# Patient Record
Sex: Female | Born: 1970 | Race: Black or African American | Hispanic: No | Marital: Married | Smoking: Current some day smoker
Health system: Southern US, Community
[De-identification: ages and names within clinical notes are randomized; demographics above are authoritative.]

## PROBLEM LIST (undated history)

## (undated) DIAGNOSIS — G4730 Sleep apnea, unspecified: Secondary | ICD-10-CM

## (undated) DIAGNOSIS — Z803 Family history of malignant neoplasm of breast: Secondary | ICD-10-CM

## (undated) DIAGNOSIS — I10 Essential (primary) hypertension: Secondary | ICD-10-CM

## (undated) DIAGNOSIS — E119 Type 2 diabetes mellitus without complications: Secondary | ICD-10-CM

## (undated) DIAGNOSIS — G373 Acute transverse myelitis in demyelinating disease of central nervous system: Secondary | ICD-10-CM

## (undated) DIAGNOSIS — Z800 Family history of malignant neoplasm of digestive organs: Secondary | ICD-10-CM

## (undated) DIAGNOSIS — IMO0001 Reserved for inherently not codable concepts without codable children: Secondary | ICD-10-CM

## (undated) DIAGNOSIS — D489 Neoplasm of uncertain behavior, unspecified: Secondary | ICD-10-CM

## (undated) DIAGNOSIS — E162 Hypoglycemia, unspecified: Secondary | ICD-10-CM

## (undated) DIAGNOSIS — IMO0002 Reserved for concepts with insufficient information to code with codable children: Secondary | ICD-10-CM

## (undated) HISTORY — PX: HAND SURGERY: SHX662

## (undated) HISTORY — DX: Family history of malignant neoplasm of digestive organs: Z80.0

## (undated) HISTORY — PX: ECTOPIC PREGNANCY SURGERY: SHX613

## (undated) HISTORY — DX: Sleep apnea, unspecified: G47.30

## (undated) HISTORY — DX: Family history of malignant neoplasm of breast: Z80.3

## (undated) HISTORY — DX: Reserved for inherently not codable concepts without codable children: IMO0001

## (undated) HISTORY — PX: ABDOMINAL HYSTERECTOMY: SHX81

## (undated) HISTORY — PX: ABDOMINAL HERNIA REPAIR: SHX539

## (undated) HISTORY — DX: Reserved for concepts with insufficient information to code with codable children: IMO0002

## (undated) HISTORY — PX: DERMOID CYST  EXCISION: SHX1452

## (undated) HISTORY — DX: Acute transverse myelitis in demyelinating disease of central nervous system: G37.3

## (undated) SURGERY — EXCISION MASS
Anesthesia: General | Laterality: Right

---

## 1997-12-25 ENCOUNTER — Emergency Department (HOSPITAL_COMMUNITY): Admission: EM | Admit: 1997-12-25 | Discharge: 1997-12-25 | Payer: Self-pay | Admitting: Emergency Medicine

## 1998-01-17 ENCOUNTER — Other Ambulatory Visit: Admission: RE | Admit: 1998-01-17 | Discharge: 1998-01-17 | Payer: Self-pay | Admitting: Obstetrics & Gynecology

## 1998-01-26 ENCOUNTER — Emergency Department (HOSPITAL_COMMUNITY): Admission: EM | Admit: 1998-01-26 | Discharge: 1998-01-26 | Payer: Self-pay | Admitting: *Deleted

## 1998-01-26 ENCOUNTER — Emergency Department (HOSPITAL_COMMUNITY): Admission: EM | Admit: 1998-01-26 | Discharge: 1998-01-26 | Payer: Self-pay | Admitting: Emergency Medicine

## 1998-02-04 ENCOUNTER — Emergency Department (HOSPITAL_COMMUNITY): Admission: EM | Admit: 1998-02-04 | Discharge: 1998-02-04 | Payer: Self-pay | Admitting: Emergency Medicine

## 1998-06-03 ENCOUNTER — Encounter: Payer: Self-pay | Admitting: Emergency Medicine

## 1998-06-03 ENCOUNTER — Emergency Department (HOSPITAL_COMMUNITY): Admission: EM | Admit: 1998-06-03 | Discharge: 1998-06-03 | Payer: Self-pay | Admitting: Emergency Medicine

## 1998-10-14 ENCOUNTER — Encounter: Admission: RE | Admit: 1998-10-14 | Discharge: 1999-01-12 | Payer: Self-pay | Admitting: Internal Medicine

## 1998-12-25 ENCOUNTER — Ambulatory Visit (HOSPITAL_COMMUNITY): Admission: RE | Admit: 1998-12-25 | Discharge: 1998-12-25 | Payer: Self-pay | Admitting: Obstetrics & Gynecology

## 1999-03-02 ENCOUNTER — Emergency Department (HOSPITAL_COMMUNITY): Admission: EM | Admit: 1999-03-02 | Discharge: 1999-03-02 | Payer: Self-pay | Admitting: Internal Medicine

## 1999-04-30 ENCOUNTER — Encounter: Admission: RE | Admit: 1999-04-30 | Discharge: 1999-05-18 | Payer: Self-pay | Admitting: *Deleted

## 1999-08-23 ENCOUNTER — Emergency Department (HOSPITAL_COMMUNITY): Admission: EM | Admit: 1999-08-23 | Discharge: 1999-08-23 | Payer: Self-pay | Admitting: Emergency Medicine

## 1999-08-23 ENCOUNTER — Encounter: Payer: Self-pay | Admitting: Emergency Medicine

## 1999-09-25 ENCOUNTER — Observation Stay (HOSPITAL_COMMUNITY): Admission: RE | Admit: 1999-09-25 | Discharge: 1999-09-26 | Payer: Self-pay | Admitting: Obstetrics & Gynecology

## 1999-09-25 ENCOUNTER — Encounter (INDEPENDENT_AMBULATORY_CARE_PROVIDER_SITE_OTHER): Payer: Self-pay

## 1999-12-02 ENCOUNTER — Emergency Department (HOSPITAL_COMMUNITY): Admission: EM | Admit: 1999-12-02 | Discharge: 1999-12-02 | Payer: Self-pay | Admitting: Emergency Medicine

## 2000-04-24 ENCOUNTER — Emergency Department (HOSPITAL_COMMUNITY): Admission: EM | Admit: 2000-04-24 | Discharge: 2000-04-24 | Payer: Self-pay | Admitting: Emergency Medicine

## 2000-07-24 ENCOUNTER — Inpatient Hospital Stay (HOSPITAL_COMMUNITY): Admission: AD | Admit: 2000-07-24 | Discharge: 2000-07-24 | Payer: Self-pay | Admitting: Obstetrics & Gynecology

## 2000-07-24 ENCOUNTER — Emergency Department (HOSPITAL_COMMUNITY): Admission: EM | Admit: 2000-07-24 | Discharge: 2000-07-24 | Payer: Self-pay | Admitting: Emergency Medicine

## 2000-10-08 ENCOUNTER — Emergency Department (HOSPITAL_COMMUNITY): Admission: EM | Admit: 2000-10-08 | Discharge: 2000-10-08 | Payer: Self-pay | Admitting: Emergency Medicine

## 2000-10-12 ENCOUNTER — Emergency Department (HOSPITAL_COMMUNITY): Admission: EM | Admit: 2000-10-12 | Discharge: 2000-10-12 | Payer: Self-pay | Admitting: Emergency Medicine

## 2000-10-19 ENCOUNTER — Other Ambulatory Visit: Admission: RE | Admit: 2000-10-19 | Discharge: 2000-10-19 | Payer: Self-pay | Admitting: Obstetrics & Gynecology

## 2001-04-17 ENCOUNTER — Encounter (INDEPENDENT_AMBULATORY_CARE_PROVIDER_SITE_OTHER): Payer: Self-pay | Admitting: Specialist

## 2001-04-17 ENCOUNTER — Inpatient Hospital Stay (HOSPITAL_COMMUNITY): Admission: AD | Admit: 2001-04-17 | Discharge: 2001-04-25 | Payer: Self-pay | Admitting: Obstetrics & Gynecology

## 2001-04-18 ENCOUNTER — Encounter: Payer: Self-pay | Admitting: Obstetrics & Gynecology

## 2001-04-26 ENCOUNTER — Encounter: Admission: RE | Admit: 2001-04-26 | Discharge: 2001-05-26 | Payer: Self-pay | Admitting: Obstetrics & Gynecology

## 2001-06-26 ENCOUNTER — Encounter: Admission: RE | Admit: 2001-06-26 | Discharge: 2001-07-26 | Payer: Self-pay | Admitting: Obstetrics & Gynecology

## 2001-08-26 ENCOUNTER — Encounter: Admission: RE | Admit: 2001-08-26 | Discharge: 2001-09-25 | Payer: Self-pay | Admitting: Obstetrics & Gynecology

## 2001-09-26 ENCOUNTER — Encounter: Admission: RE | Admit: 2001-09-26 | Discharge: 2001-10-26 | Payer: Self-pay | Admitting: Obstetrics & Gynecology

## 2001-10-19 ENCOUNTER — Encounter (INDEPENDENT_AMBULATORY_CARE_PROVIDER_SITE_OTHER): Payer: Self-pay | Admitting: Specialist

## 2001-10-19 ENCOUNTER — Ambulatory Visit (HOSPITAL_BASED_OUTPATIENT_CLINIC_OR_DEPARTMENT_OTHER): Admission: RE | Admit: 2001-10-19 | Discharge: 2001-10-19 | Payer: Self-pay | Admitting: Plastic Surgery

## 2001-10-24 ENCOUNTER — Encounter: Admission: RE | Admit: 2001-10-24 | Discharge: 2001-11-23 | Payer: Self-pay | Admitting: Obstetrics & Gynecology

## 2001-12-24 ENCOUNTER — Encounter: Admission: RE | Admit: 2001-12-24 | Discharge: 2002-01-23 | Payer: Self-pay | Admitting: Obstetrics & Gynecology

## 2002-01-24 ENCOUNTER — Encounter: Admission: RE | Admit: 2002-01-24 | Discharge: 2002-02-23 | Payer: Self-pay | Admitting: Obstetrics & Gynecology

## 2002-01-31 ENCOUNTER — Other Ambulatory Visit: Admission: RE | Admit: 2002-01-31 | Discharge: 2002-01-31 | Payer: Self-pay | Admitting: Obstetrics & Gynecology

## 2002-03-26 ENCOUNTER — Encounter: Admission: RE | Admit: 2002-03-26 | Discharge: 2002-04-25 | Payer: Self-pay | Admitting: Obstetrics & Gynecology

## 2002-04-26 ENCOUNTER — Encounter: Admission: RE | Admit: 2002-04-26 | Discharge: 2002-05-26 | Payer: Self-pay | Admitting: Obstetrics & Gynecology

## 2002-06-26 ENCOUNTER — Encounter: Admission: RE | Admit: 2002-06-26 | Discharge: 2002-07-26 | Payer: Self-pay | Admitting: Obstetrics & Gynecology

## 2002-07-21 ENCOUNTER — Emergency Department (HOSPITAL_COMMUNITY): Admission: EM | Admit: 2002-07-21 | Discharge: 2002-07-21 | Payer: Self-pay

## 2002-11-04 ENCOUNTER — Emergency Department (HOSPITAL_COMMUNITY): Admission: EM | Admit: 2002-11-04 | Discharge: 2002-11-04 | Payer: Self-pay | Admitting: Emergency Medicine

## 2002-11-04 ENCOUNTER — Encounter: Payer: Self-pay | Admitting: Emergency Medicine

## 2003-04-13 ENCOUNTER — Emergency Department (HOSPITAL_COMMUNITY): Admission: EM | Admit: 2003-04-13 | Discharge: 2003-04-13 | Payer: Self-pay | Admitting: Emergency Medicine

## 2003-07-13 ENCOUNTER — Emergency Department (HOSPITAL_COMMUNITY): Admission: EM | Admit: 2003-07-13 | Discharge: 2003-07-14 | Payer: Self-pay | Admitting: *Deleted

## 2003-08-09 ENCOUNTER — Emergency Department (HOSPITAL_COMMUNITY): Admission: EM | Admit: 2003-08-09 | Discharge: 2003-08-09 | Payer: Self-pay | Admitting: *Deleted

## 2003-10-14 ENCOUNTER — Emergency Department (HOSPITAL_COMMUNITY): Admission: EM | Admit: 2003-10-14 | Discharge: 2003-10-14 | Payer: Self-pay | Admitting: Emergency Medicine

## 2004-10-29 ENCOUNTER — Emergency Department (HOSPITAL_COMMUNITY): Admission: EM | Admit: 2004-10-29 | Discharge: 2004-10-29 | Payer: Self-pay | Admitting: Family Medicine

## 2004-10-31 ENCOUNTER — Emergency Department (HOSPITAL_COMMUNITY): Admission: EM | Admit: 2004-10-31 | Discharge: 2004-10-31 | Payer: Self-pay | Admitting: Family Medicine

## 2005-01-02 IMAGING — CR DG ABDOMEN ACUTE W/ 1V CHEST
3 series · 3 of 3 positions shown · non-contrast
Comparison: none

CLINICAL DATA: Right lower quadrant pain.  
 ACUTE ABDOMEN SERIES:
 There is an approximately 4 mm calcification, which projects over the expected region of the right kidney.  Additional calcific densities project to the right of the L3 and L4 vertebral bodies of uncertain etiology.  Normal bowel gas pattern.  The lungs are clear.

[view not recorded (1 of 3)]
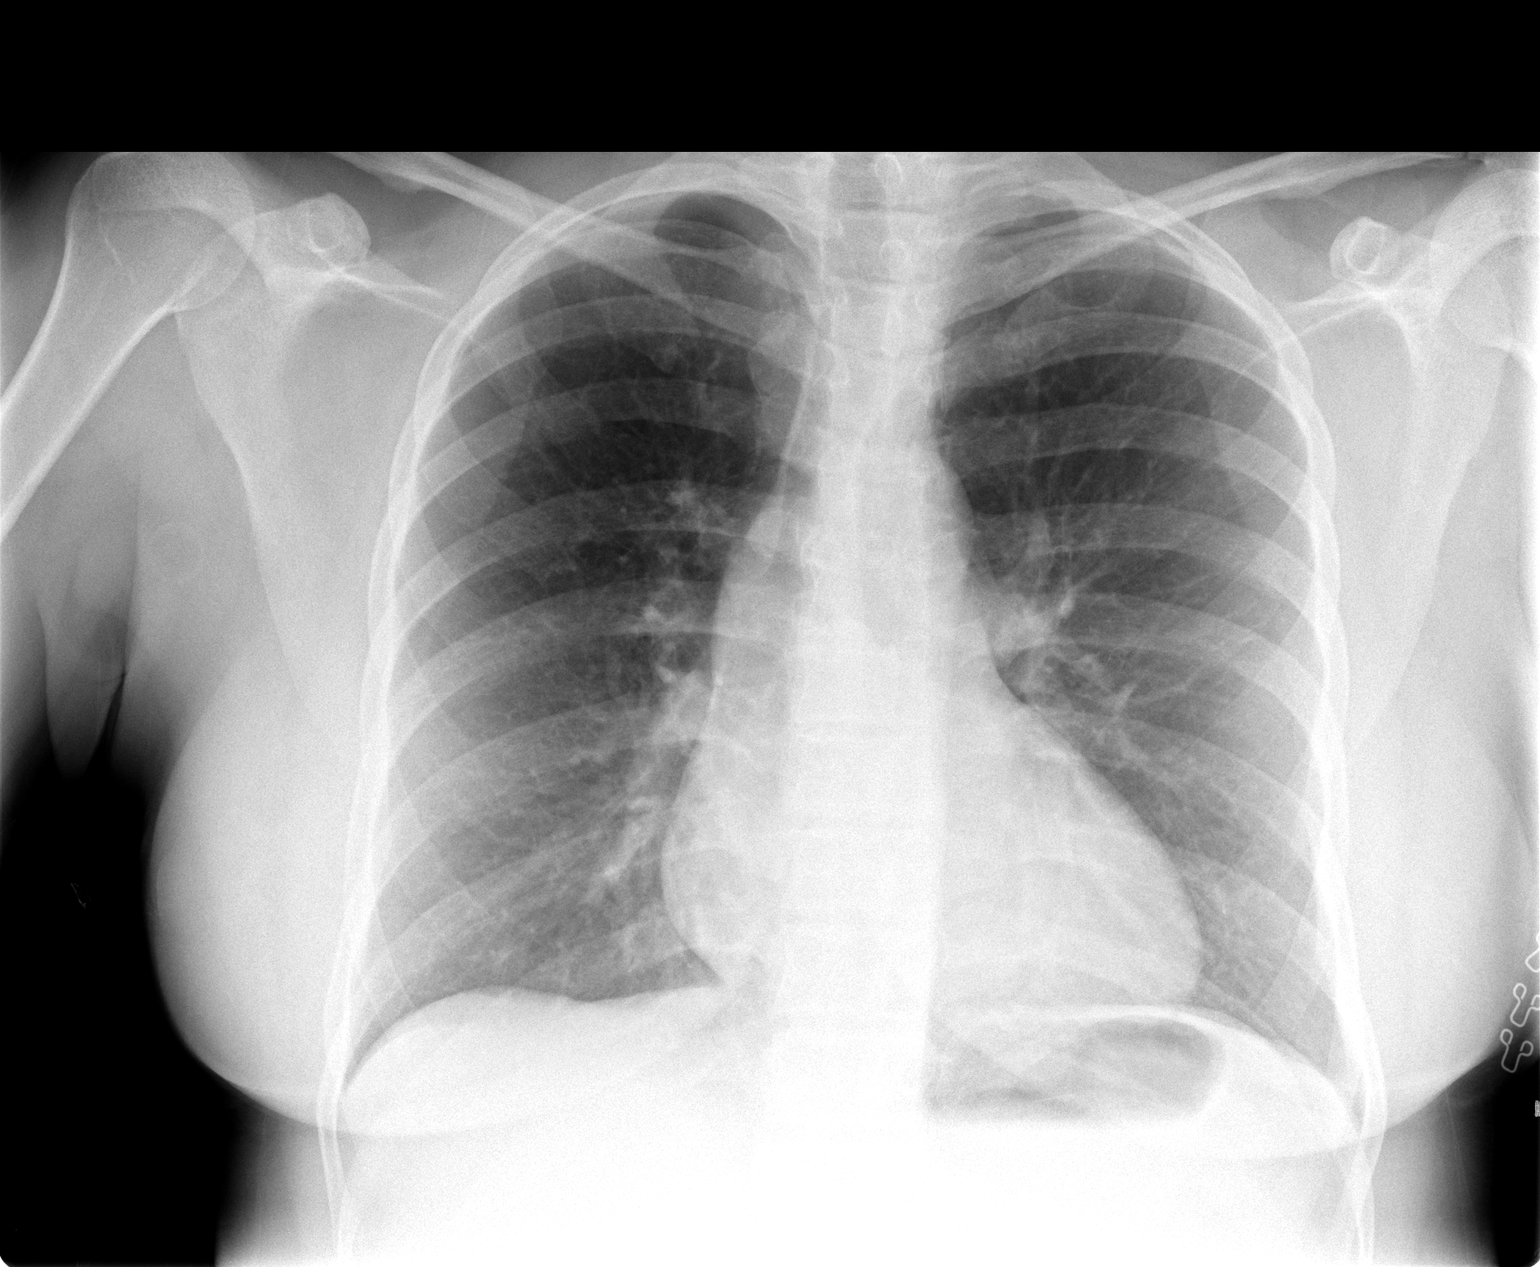

[view not recorded (2 of 3)]
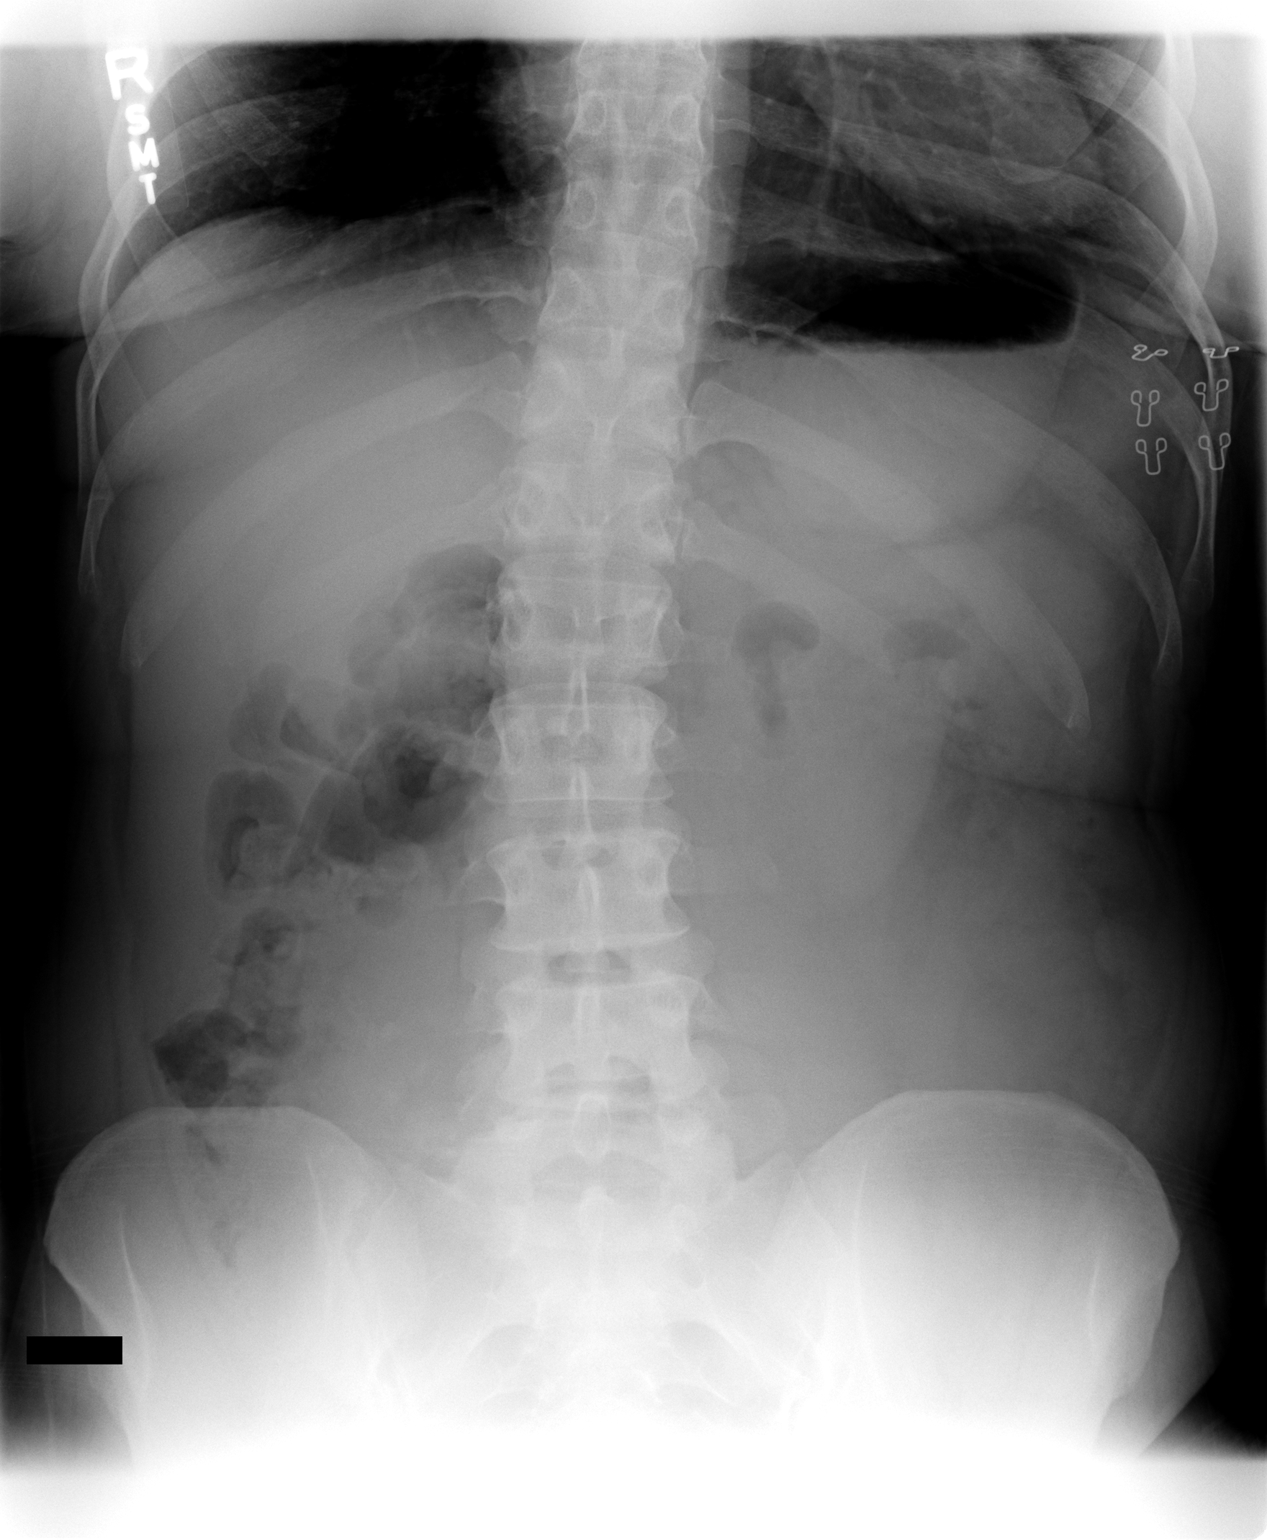

[view not recorded (3 of 3)]
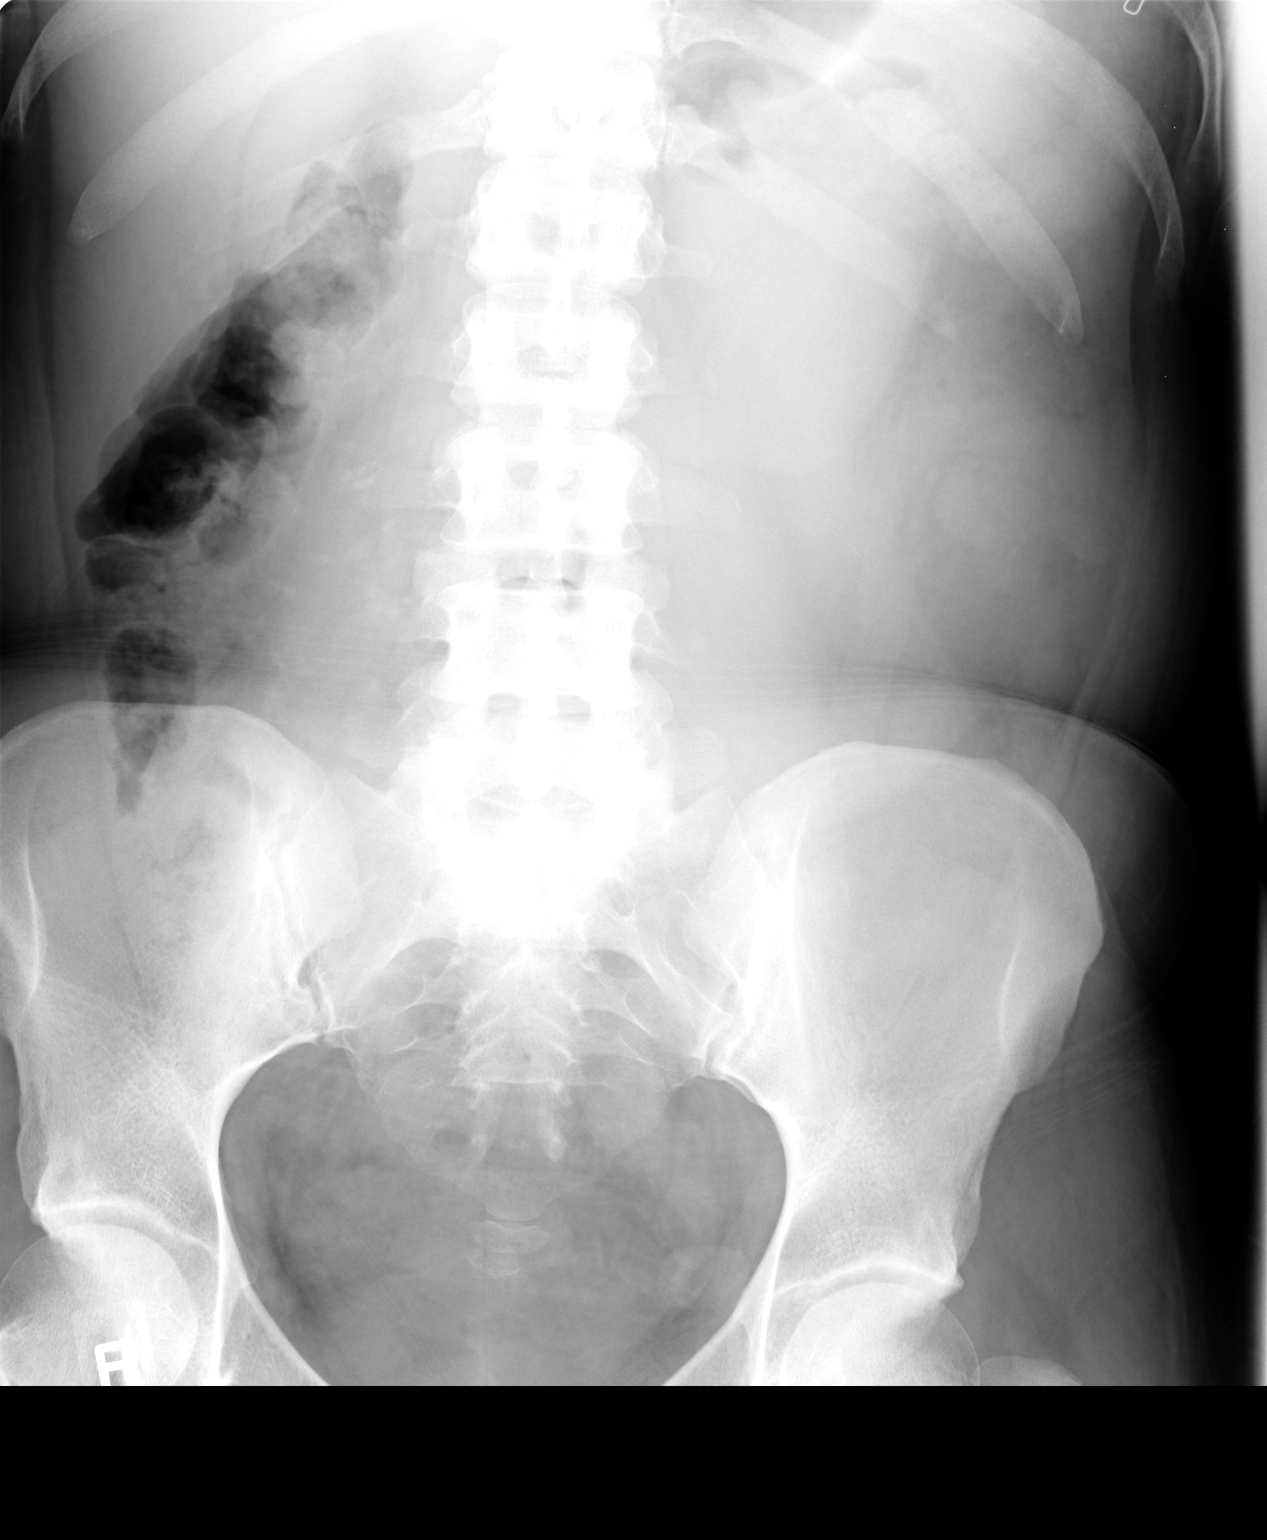

[3 of 3 positions shown; findings below may reference images not displayed]

IMPRESSION: 1.  Probable right nephrolithiasis.  Normal bowel gas.
 2.  Lungs clear.

## 2005-01-03 ENCOUNTER — Inpatient Hospital Stay (HOSPITAL_COMMUNITY): Admission: AD | Admit: 2005-01-03 | Discharge: 2005-01-06 | Payer: Self-pay | Admitting: Obstetrics and Gynecology

## 2005-01-03 ENCOUNTER — Encounter (INDEPENDENT_AMBULATORY_CARE_PROVIDER_SITE_OTHER): Payer: Self-pay | Admitting: *Deleted

## 2005-01-03 ENCOUNTER — Encounter: Payer: Self-pay | Admitting: Emergency Medicine

## 2005-01-03 IMAGING — CT CT ABDOMEN W/ CM
2 of 6 series · 16 of 46 positions shown, 18 images · IV contrast ([ID] OMNI 300)
Comparison: none

CLINICAL DATA: Right abdominopelvic pain.  Previous left oophorectomy.  Please evaluate.
TECHNIQUE: Multidetector helical study performed during IV contrast enhancement of 100 cc of Omnipaque 300.  Initially, the IV contrast injection was performed at [DATE] cc/second; however, the patient complained of burning.  The IV was checked, and there was no extravasation of contrast per the technologist, and the IV rate was slowed to 1 cc/second.  As a result, there is contrast within the renal collecting systems on the study and relatively dilute opacification of the vessels. 
 CT ABDOMEN WITH CONTRAST:
 There is a large mass extending from the pelvis into the lower abdomen.  This contains fat and areas of calcifications, as well as cystic areas, and this would be most consistent with a large ovarian teratoma or dermoid cyst.  Please see CT pelvis report for discussion.  
 The liver, spleen, pancreas, kidneys, and adrenal glands have a normal appearance.  There is no retroperitoneal or mesenteric adenopathy, and there is no ascites.  There are bibasilar atelectatic/infiltrative changes noted on scans of the lung bases included with the study.

[Series 102: routine abdomen · axial · 0.71mm/px · z∈[-484,-50]mm · 13 of 376 slices shown, 15 images]
[im 18/376  soft-tissue]
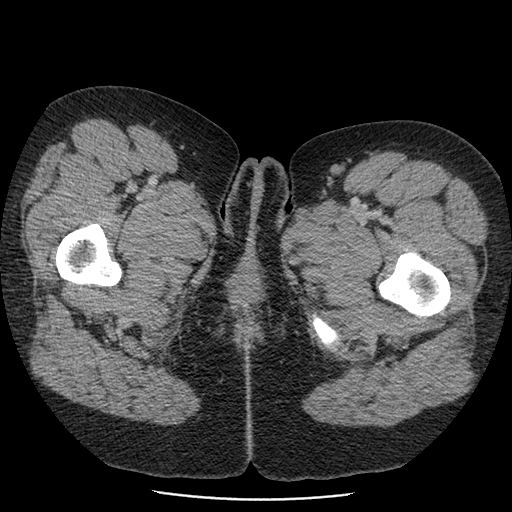
[im 18/376  bone]
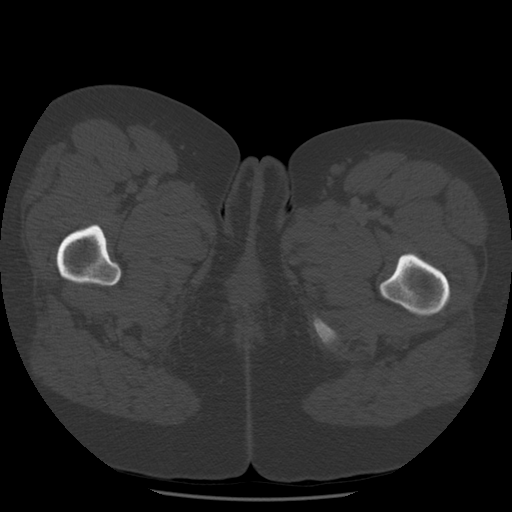
[im 52/376  soft-tissue]
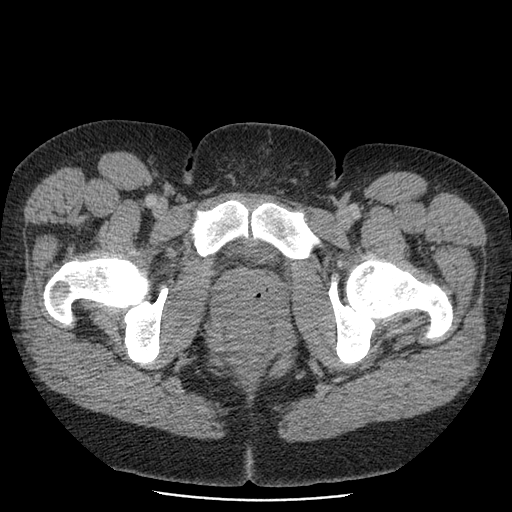
[im 86/376  soft-tissue]
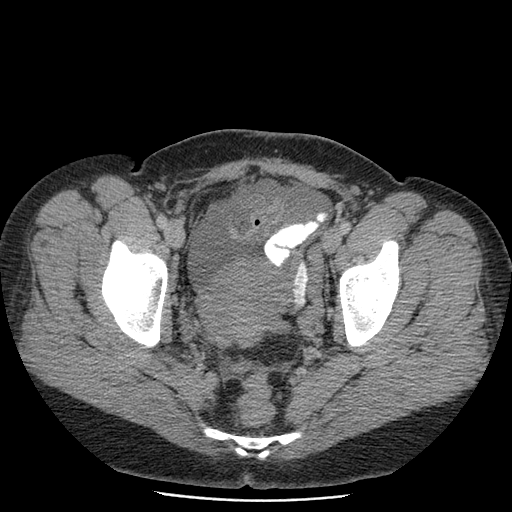
[im 103/376  soft-tissue]
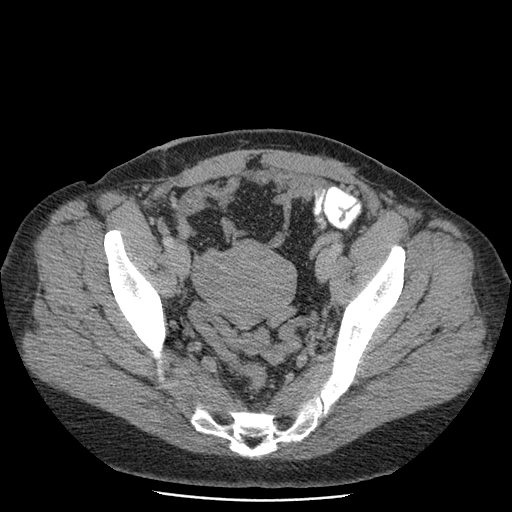
[im 137/376  soft-tissue]
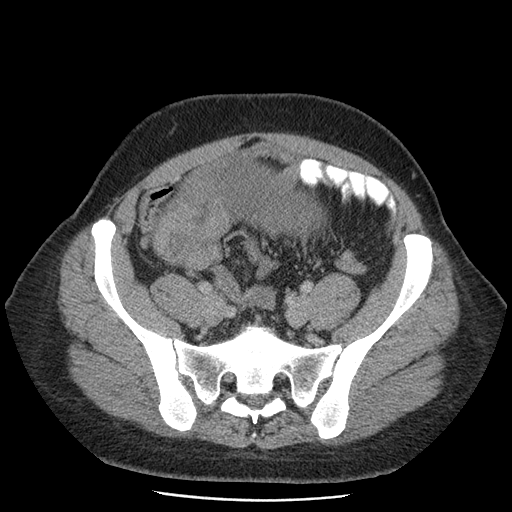
[im 154/376  soft-tissue]
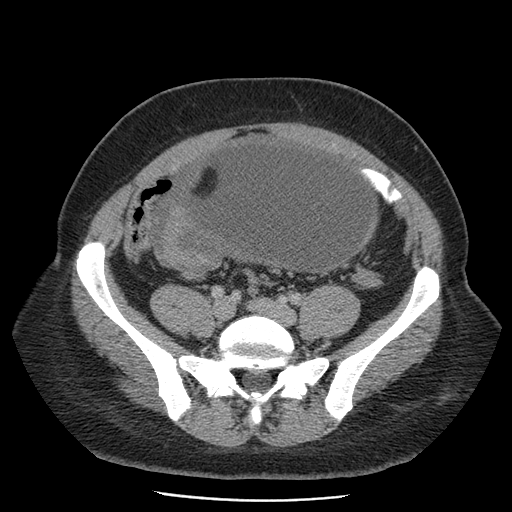
[im 188/376  soft-tissue]
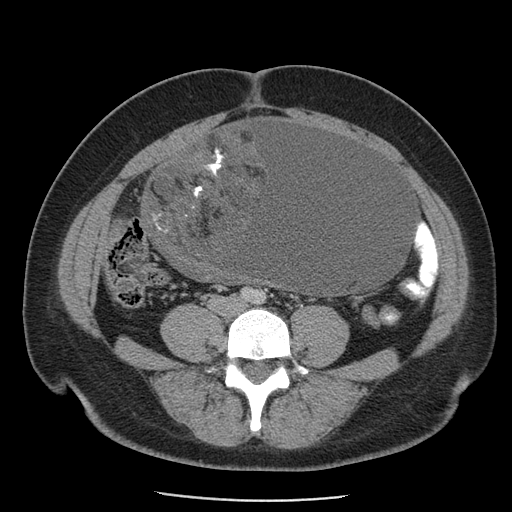
[im 222/376  soft-tissue]
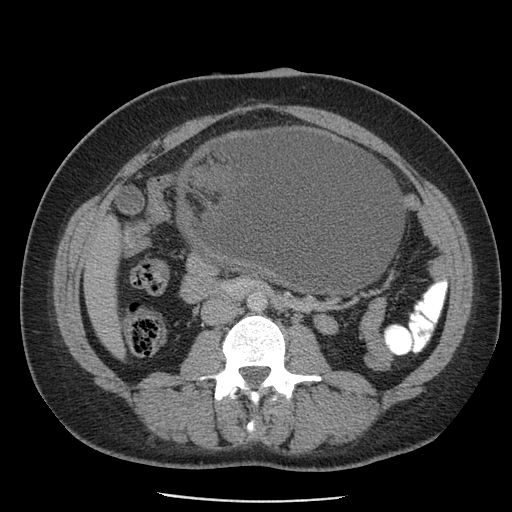
[im 239/376  soft-tissue]
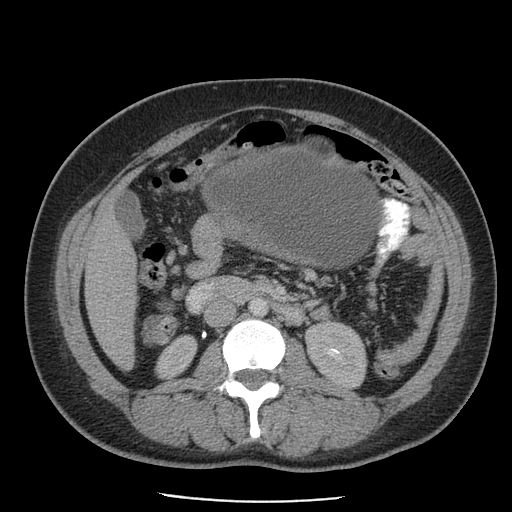
[im 239/376  bone]
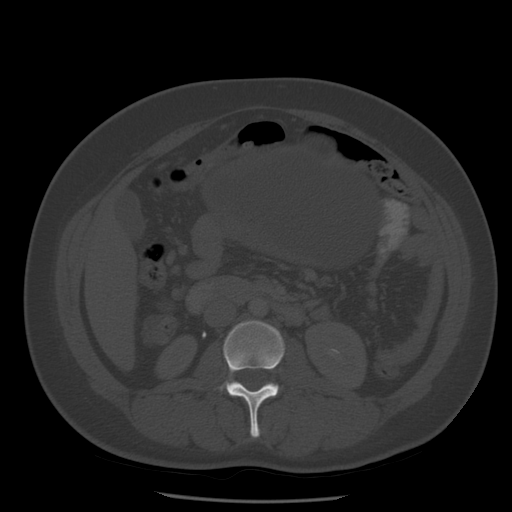
[im 273/376  soft-tissue]
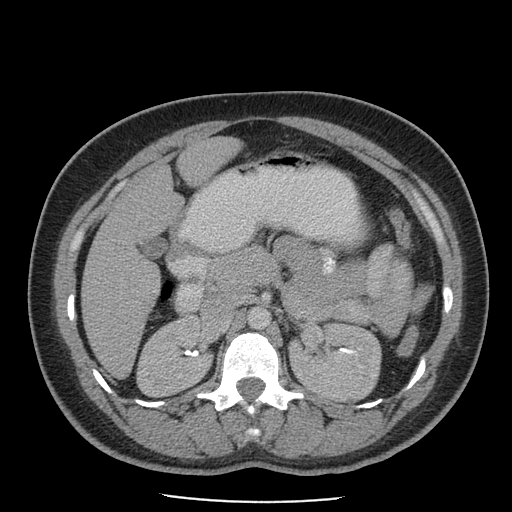
[im 290/376  soft-tissue]
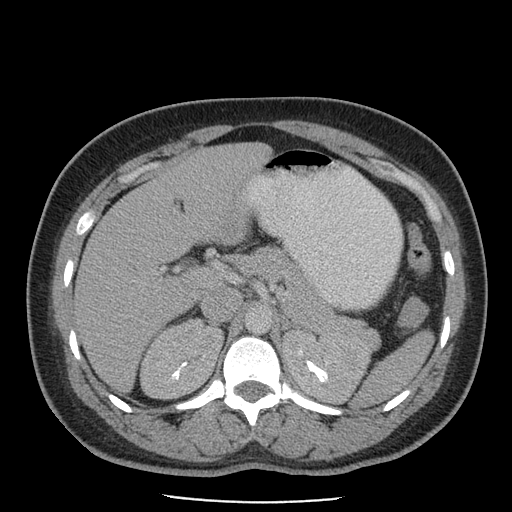
[im 324/376  soft-tissue]
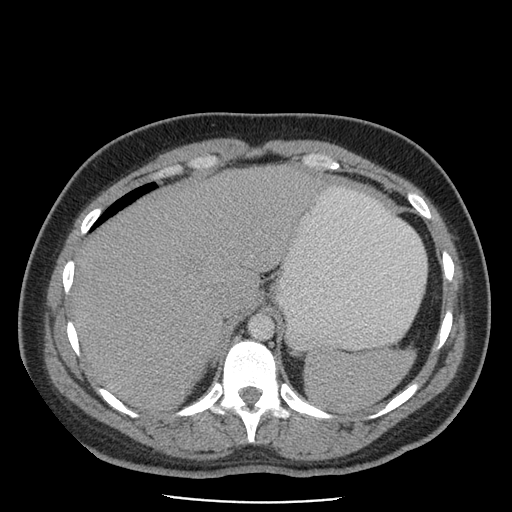
[im 358/376  soft-tissue]
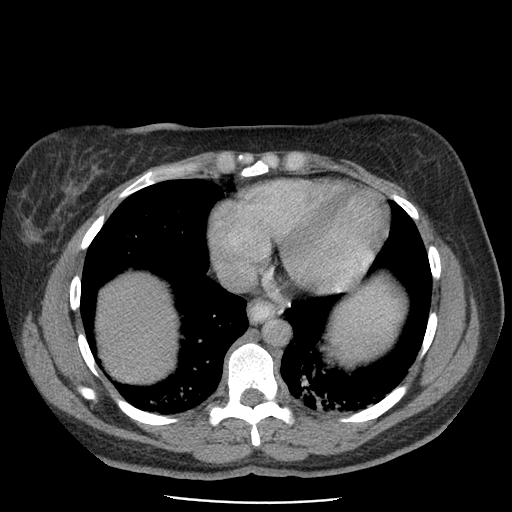

[Series 103: reformatted · sagittal · 0.96mm/px · 3 of 165 slices shown]
[im 55/165  soft-tissue]
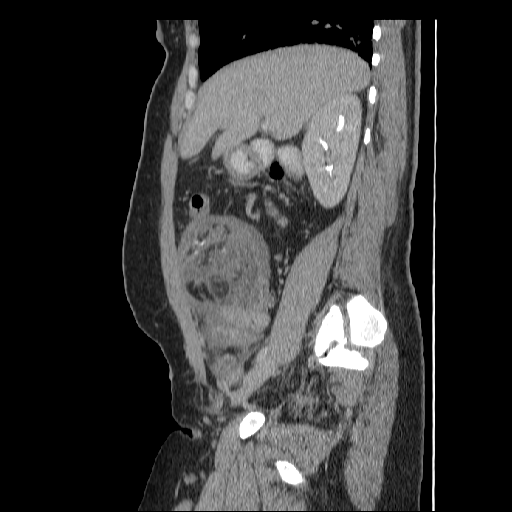
[im 73/165  soft-tissue]
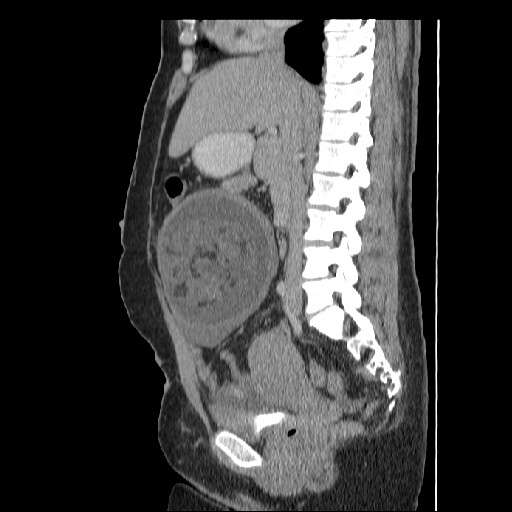
[im 92/165  soft-tissue]
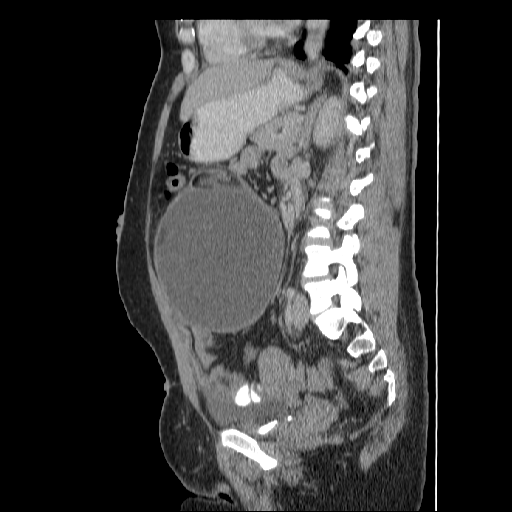

[16 of 46 positions shown; findings below may reference images not displayed]

IMPRESSION: 1.  Large pelvic tumor extending into the lower abdomen.   Please CT pelvis report for findings.  No hydronephrosis. 
 2.  Mild bibasilar atelectasis/infiltrate. 
 CT PELVIS WITH CONTRAST:
 There is a large pelvic mass, which contains fat density, soft tissue density, calcification, and water density.  The mass measures 19.7 x 11.6 x 15.7 cm in size.  This would be most consistent with ovarian teratoma or large dermoid cyst.  There is a small amount of free pelvic fluid present.  The uterus has a lobular contour, and there may be uterine leiomyomata present.  There is no adenopathy.
IMPRESSION: 1.  Large complex pelvic mass containing fat and soft tissue density, as well as water density and calcification.  This would be most consistent with a large ovarian teratoma or dermoid cyst.  There is a small amount of ascites noted within the pelvis. 
 2.  Probable uterine leiomyomata present, as well.

## 2005-01-11 ENCOUNTER — Inpatient Hospital Stay (HOSPITAL_COMMUNITY): Admission: AD | Admit: 2005-01-11 | Discharge: 2005-01-11 | Payer: Self-pay | Admitting: Pediatrics

## 2005-01-26 ENCOUNTER — Ambulatory Visit: Admission: RE | Admit: 2005-01-26 | Discharge: 2005-01-26 | Payer: Self-pay | Admitting: Gynecologic Oncology

## 2005-03-30 ENCOUNTER — Ambulatory Visit: Admission: RE | Admit: 2005-03-30 | Discharge: 2005-03-30 | Payer: Self-pay | Admitting: Gynecologic Oncology

## 2005-10-21 ENCOUNTER — Emergency Department (HOSPITAL_COMMUNITY): Admission: EM | Admit: 2005-10-21 | Discharge: 2005-10-21 | Payer: Self-pay | Admitting: Family Medicine

## 2005-11-04 ENCOUNTER — Ambulatory Visit: Payer: Self-pay | Admitting: Internal Medicine

## 2006-01-17 ENCOUNTER — Ambulatory Visit: Payer: Self-pay | Admitting: Internal Medicine

## 2006-11-30 ENCOUNTER — Emergency Department (HOSPITAL_COMMUNITY): Admission: EM | Admit: 2006-11-30 | Discharge: 2006-11-30 | Payer: Self-pay | Admitting: Family Medicine

## 2007-01-29 ENCOUNTER — Emergency Department (HOSPITAL_COMMUNITY): Admission: EM | Admit: 2007-01-29 | Discharge: 2007-01-29 | Payer: Self-pay | Admitting: Family Medicine

## 2007-05-22 DIAGNOSIS — R51 Headache: Secondary | ICD-10-CM

## 2007-05-22 DIAGNOSIS — R519 Headache, unspecified: Secondary | ICD-10-CM | POA: Insufficient documentation

## 2007-11-11 ENCOUNTER — Emergency Department (HOSPITAL_COMMUNITY): Admission: EM | Admit: 2007-11-11 | Discharge: 2007-11-11 | Payer: Self-pay | Admitting: Emergency Medicine

## 2007-11-11 IMAGING — CR DG LUMBAR SPINE 2-3V
3 series · 3 of 3 positions shown · non-contrast
Comparison: CT [DATE]

CLINICAL DATA: Low back pain and right leg numbness

LUMBAR SPINE - 2-3 VIEW

[view not recorded (1 of 3)]
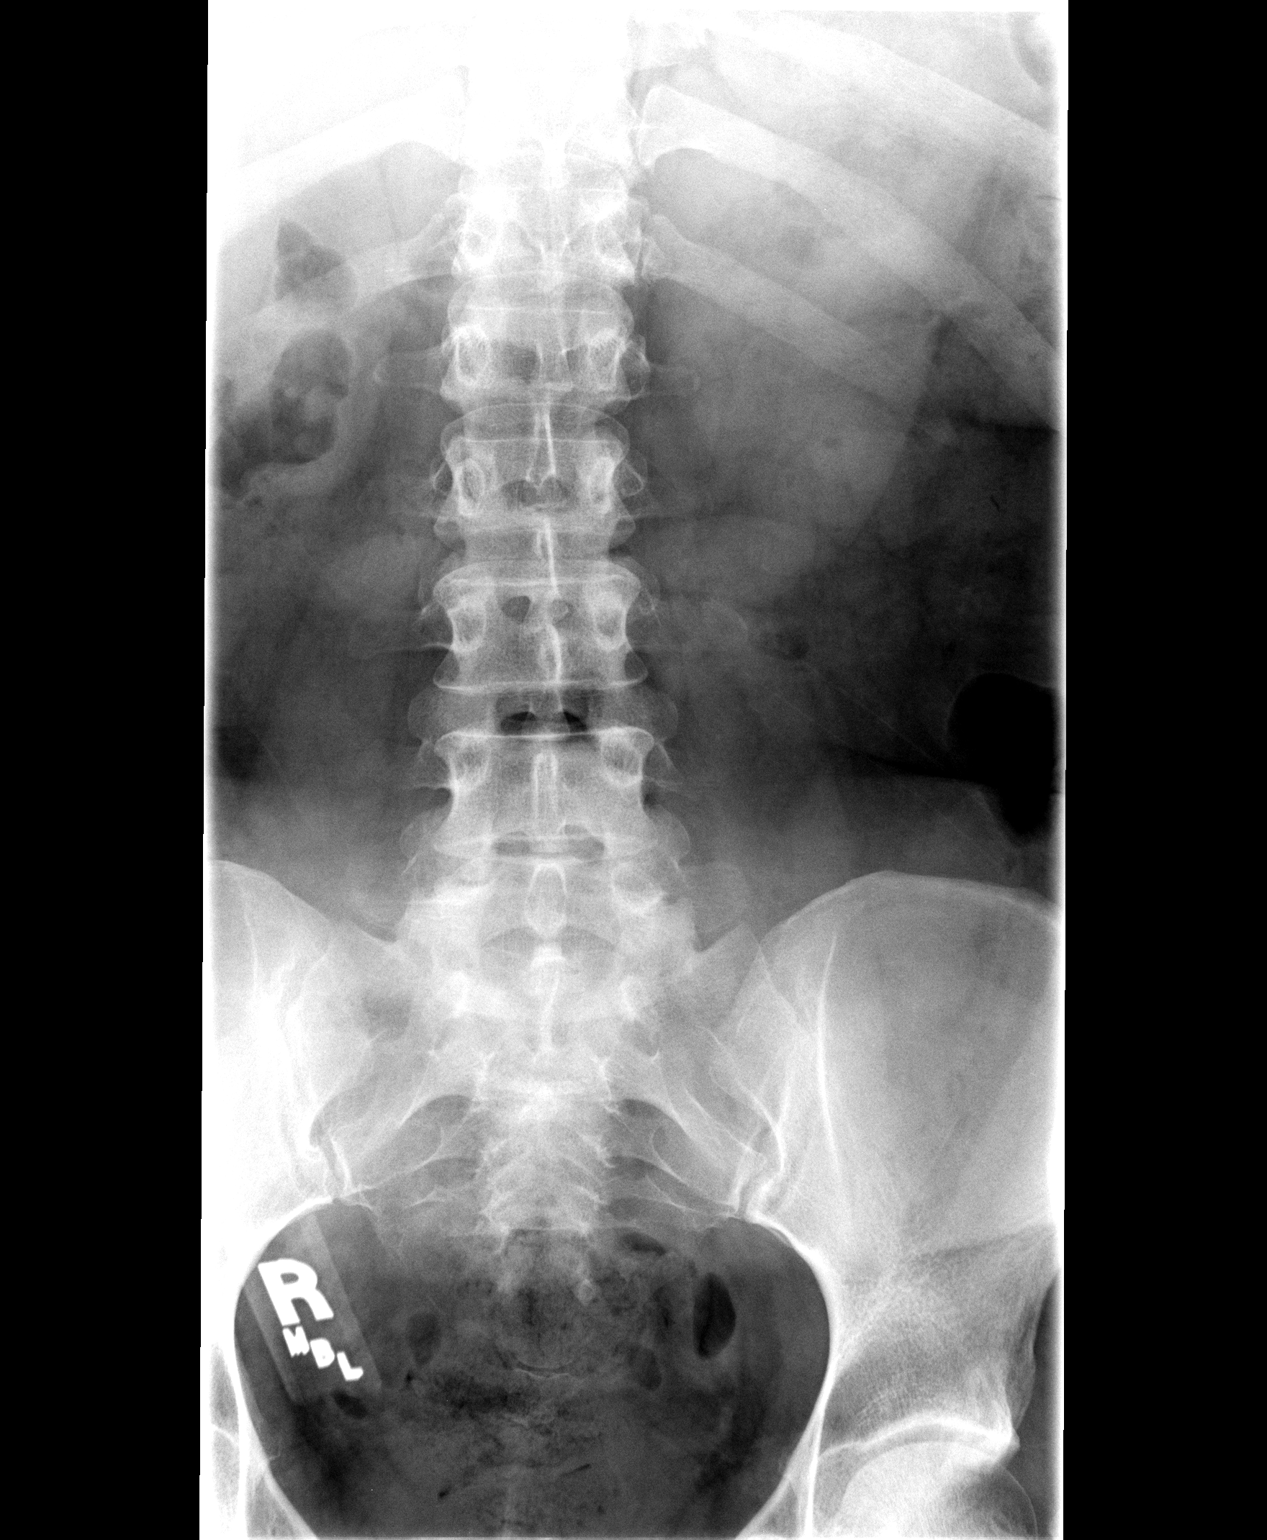

[view not recorded (2 of 3)]
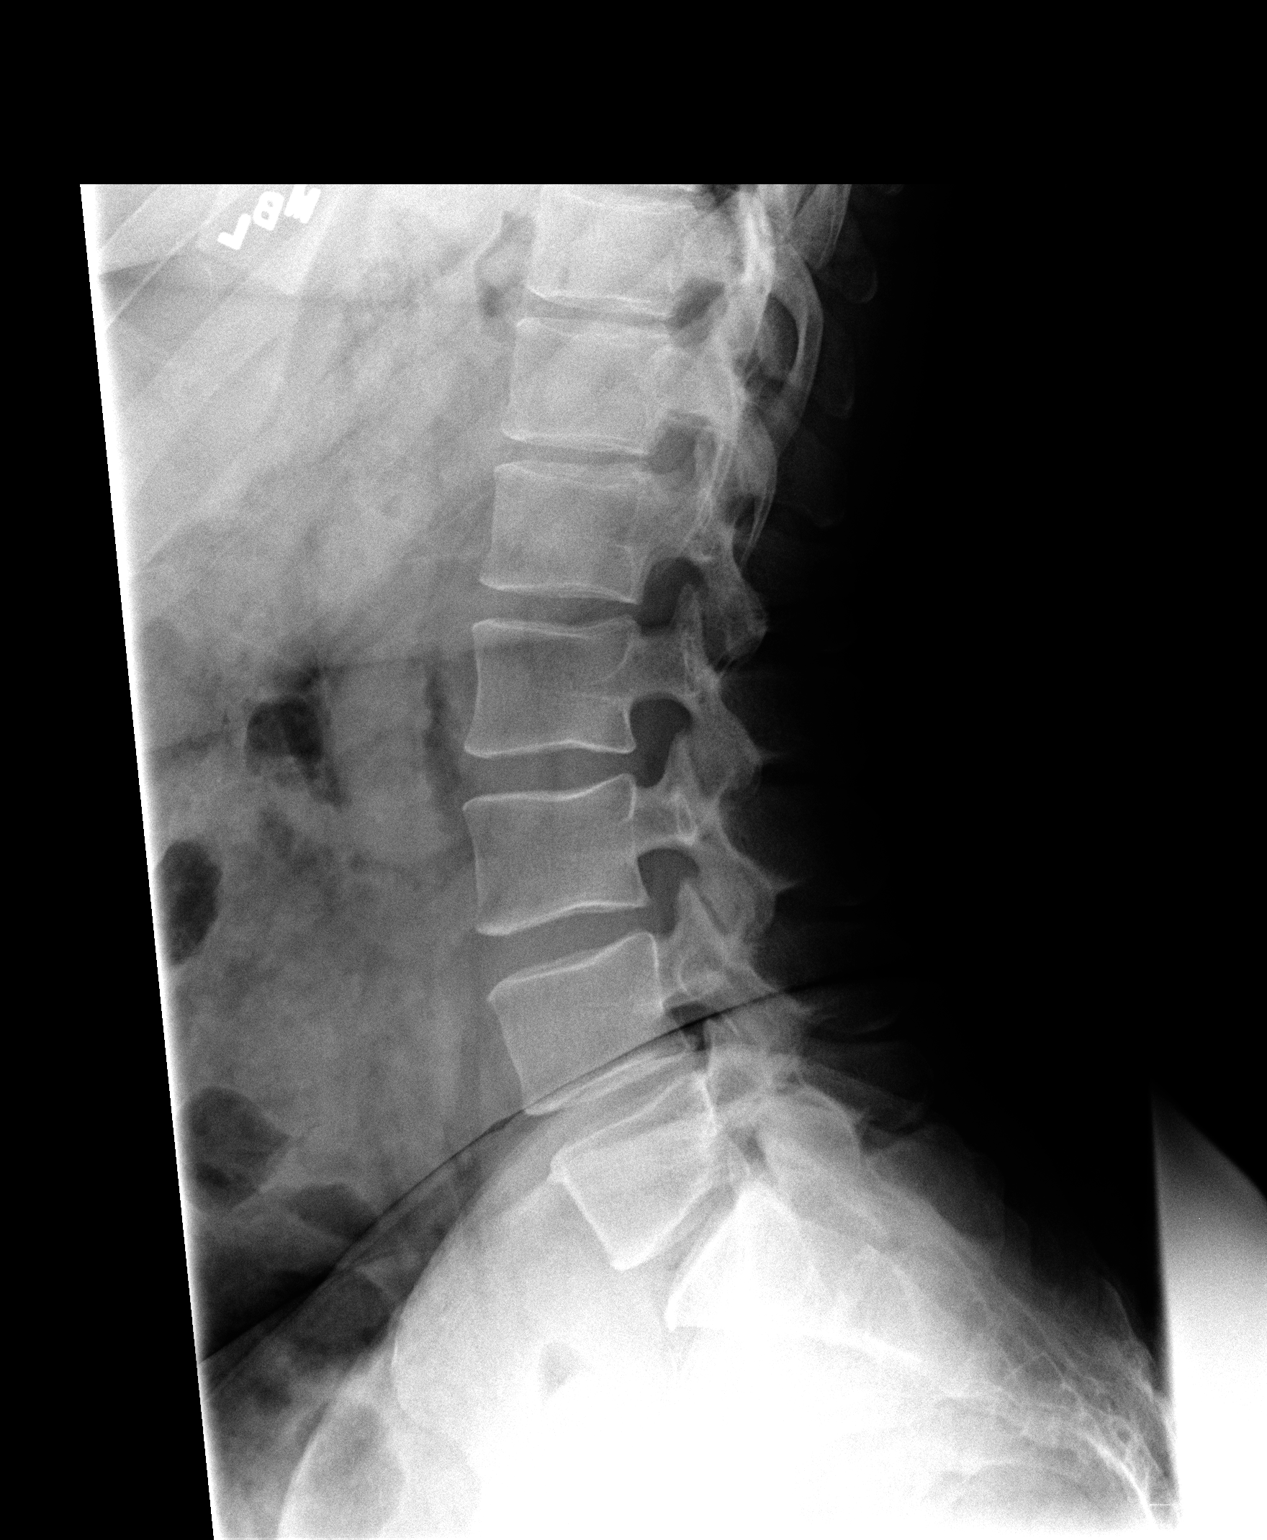

[view not recorded (3 of 3)]
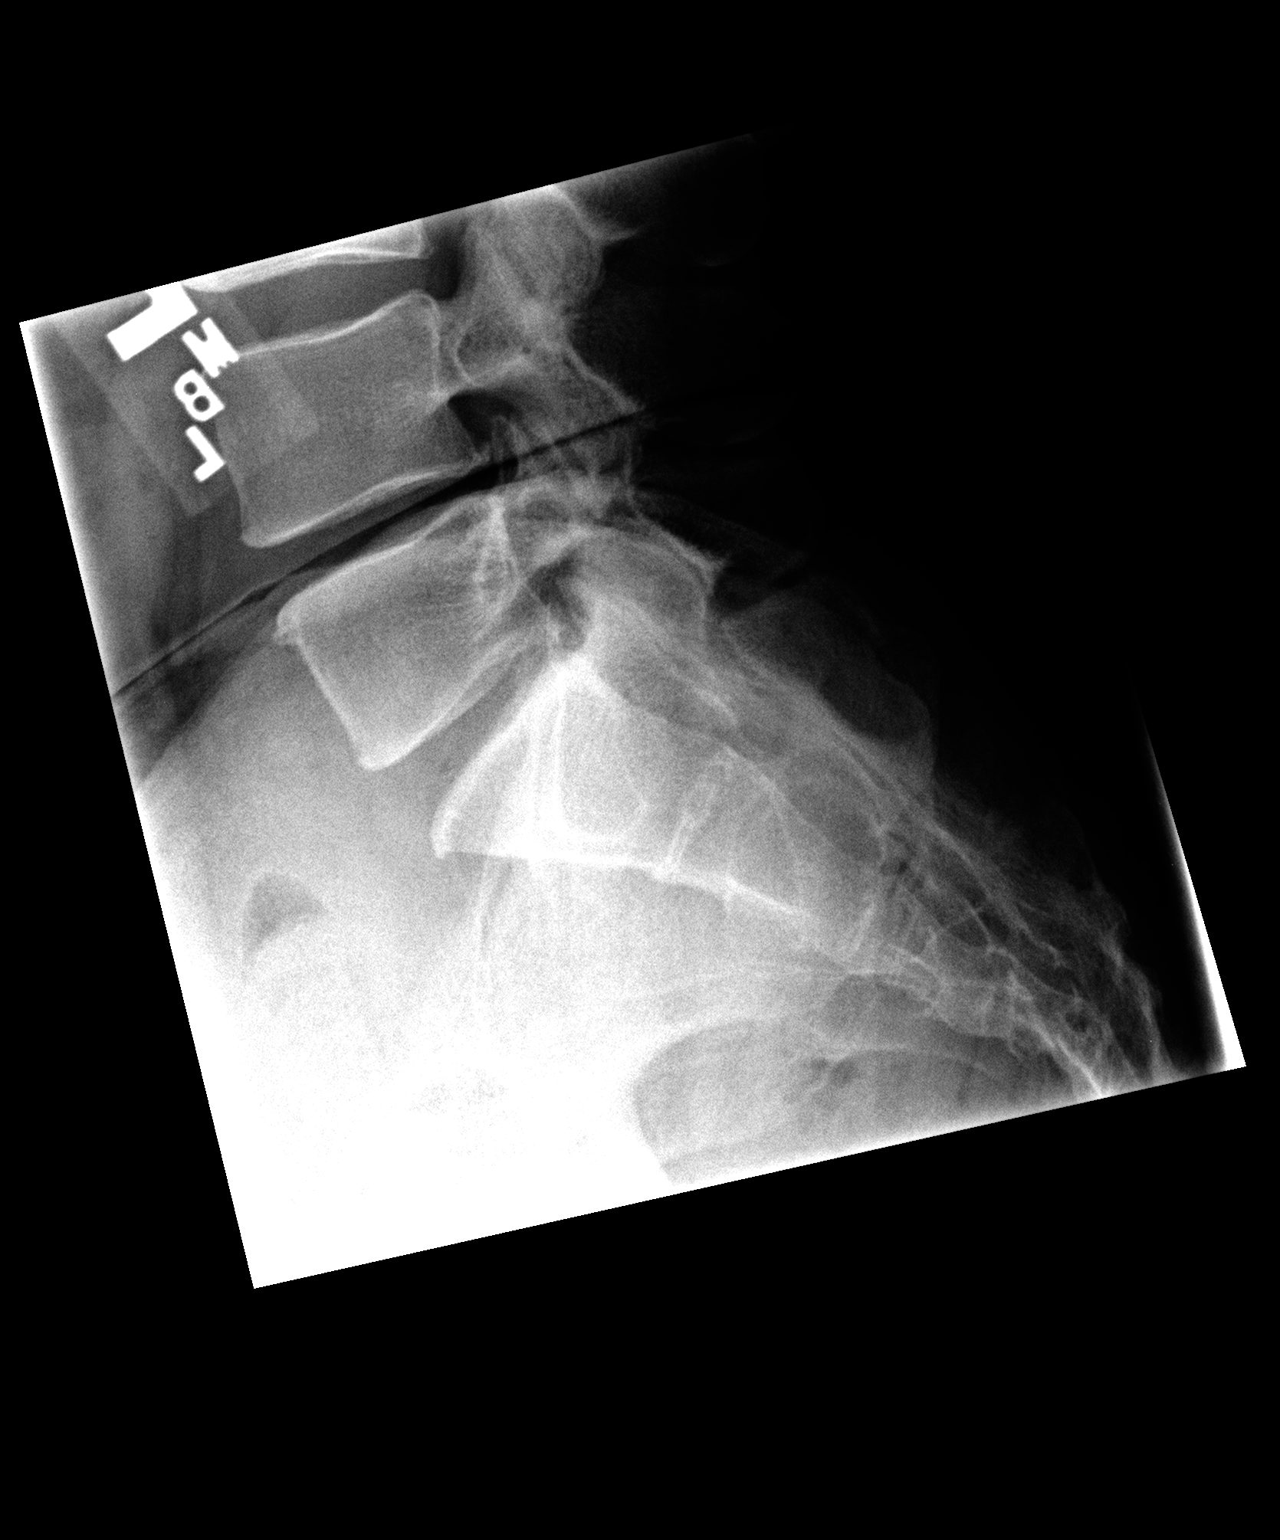

[3 of 3 positions shown; findings below may reference images not displayed]

FINDINGS: Alignment is normal.  There is disc space narrowing at L4-
5 and L5-S1.  There is mild lower lumbar degenerative facet
disease.  No pars defect or slippage.
IMPRESSION: Lower lumbar degenerative disc disease and degenerative facet
disease could be symptomatic.

## 2008-02-03 ENCOUNTER — Emergency Department (HOSPITAL_COMMUNITY): Admission: EM | Admit: 2008-02-03 | Discharge: 2008-02-03 | Payer: Self-pay | Admitting: Family Medicine

## 2008-04-30 ENCOUNTER — Ambulatory Visit: Payer: Self-pay | Admitting: Internal Medicine

## 2008-04-30 LAB — CONVERTED CEMR LAB
AST: 26 units/L (ref 0–37)
Albumin: 4.3 g/dL (ref 3.5–5.2)
Alkaline Phosphatase: 72 units/L (ref 39–117)
BUN: 9 mg/dL (ref 6–23)
Bilirubin, Direct: 0.1 mg/dL (ref 0.0–0.3)
Chloride: 111 meq/L (ref 96–112)
Eosinophils Relative: 1.4 % (ref 0.0–5.0)
Glucose, Bld: 105 mg/dL — ABNORMAL HIGH (ref 70–99)
HDL: 31.6 mg/dL — ABNORMAL LOW (ref >39.0)
LDL Cholesterol: 129 mg/dL — ABNORMAL HIGH (ref 0–99)
Lymphocytes Relative: 44.2 % (ref 12.0–46.0)
Monocytes Relative: 5.5 % (ref 3.0–12.0)
Neutrophils Relative %: 48 % (ref 43.0–77.0)
Nitrite: NEGATIVE
Platelets: 238 10*3/uL (ref 150–400)
Potassium: 3.5 meq/L (ref 3.5–5.1)
Specific Gravity, Urine: 1.02
Total CHOL/HDL Ratio: 5.9
Total Protein: 7.6 g/dL (ref 6.0–8.3)
VLDL: 26 mg/dL (ref 0–40)
WBC Urine, dipstick: NEGATIVE
WBC: 6.2 10*3/uL (ref 4.5–10.5)

## 2008-05-07 ENCOUNTER — Ambulatory Visit: Payer: Self-pay | Admitting: Internal Medicine

## 2008-05-07 DIAGNOSIS — I10 Essential (primary) hypertension: Secondary | ICD-10-CM | POA: Insufficient documentation

## 2008-05-07 DIAGNOSIS — L910 Hypertrophic scar: Secondary | ICD-10-CM

## 2008-06-14 ENCOUNTER — Telehealth: Payer: Self-pay | Admitting: Internal Medicine

## 2008-06-15 ENCOUNTER — Encounter: Payer: Self-pay | Admitting: Internal Medicine

## 2008-06-18 ENCOUNTER — Ambulatory Visit: Payer: Self-pay | Admitting: Internal Medicine

## 2008-08-26 ENCOUNTER — Emergency Department (HOSPITAL_COMMUNITY): Admission: EM | Admit: 2008-08-26 | Discharge: 2008-08-27 | Payer: Self-pay | Admitting: Emergency Medicine

## 2008-11-21 ENCOUNTER — Emergency Department (HOSPITAL_COMMUNITY): Admission: EM | Admit: 2008-11-21 | Discharge: 2008-11-21 | Payer: Self-pay | Admitting: Family Medicine

## 2008-11-21 IMAGING — CR DG CHEST 2V
2 series · 2 of 2 positions shown · non-contrast
Comparison: None

CLINICAL DATA: Chest pain

CHEST - 2 VIEW

[view not recorded (1 of 2)]
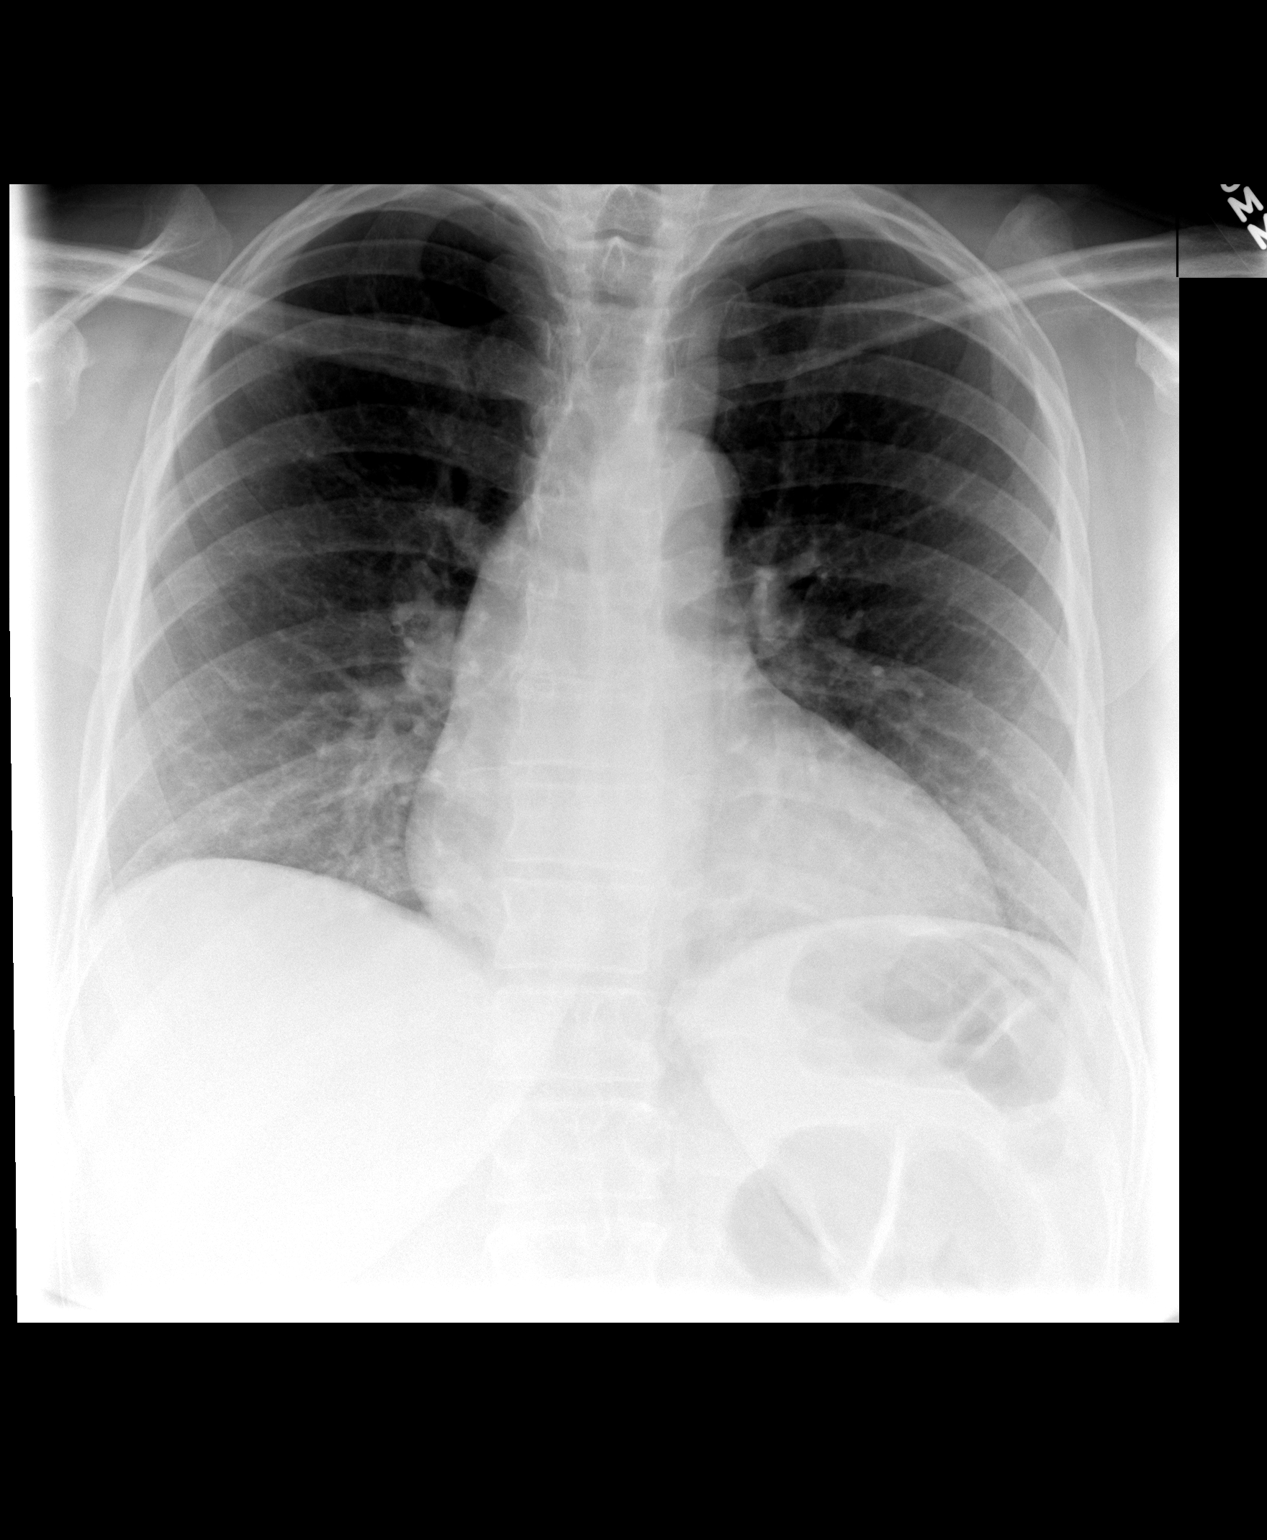

[view not recorded (2 of 2)]
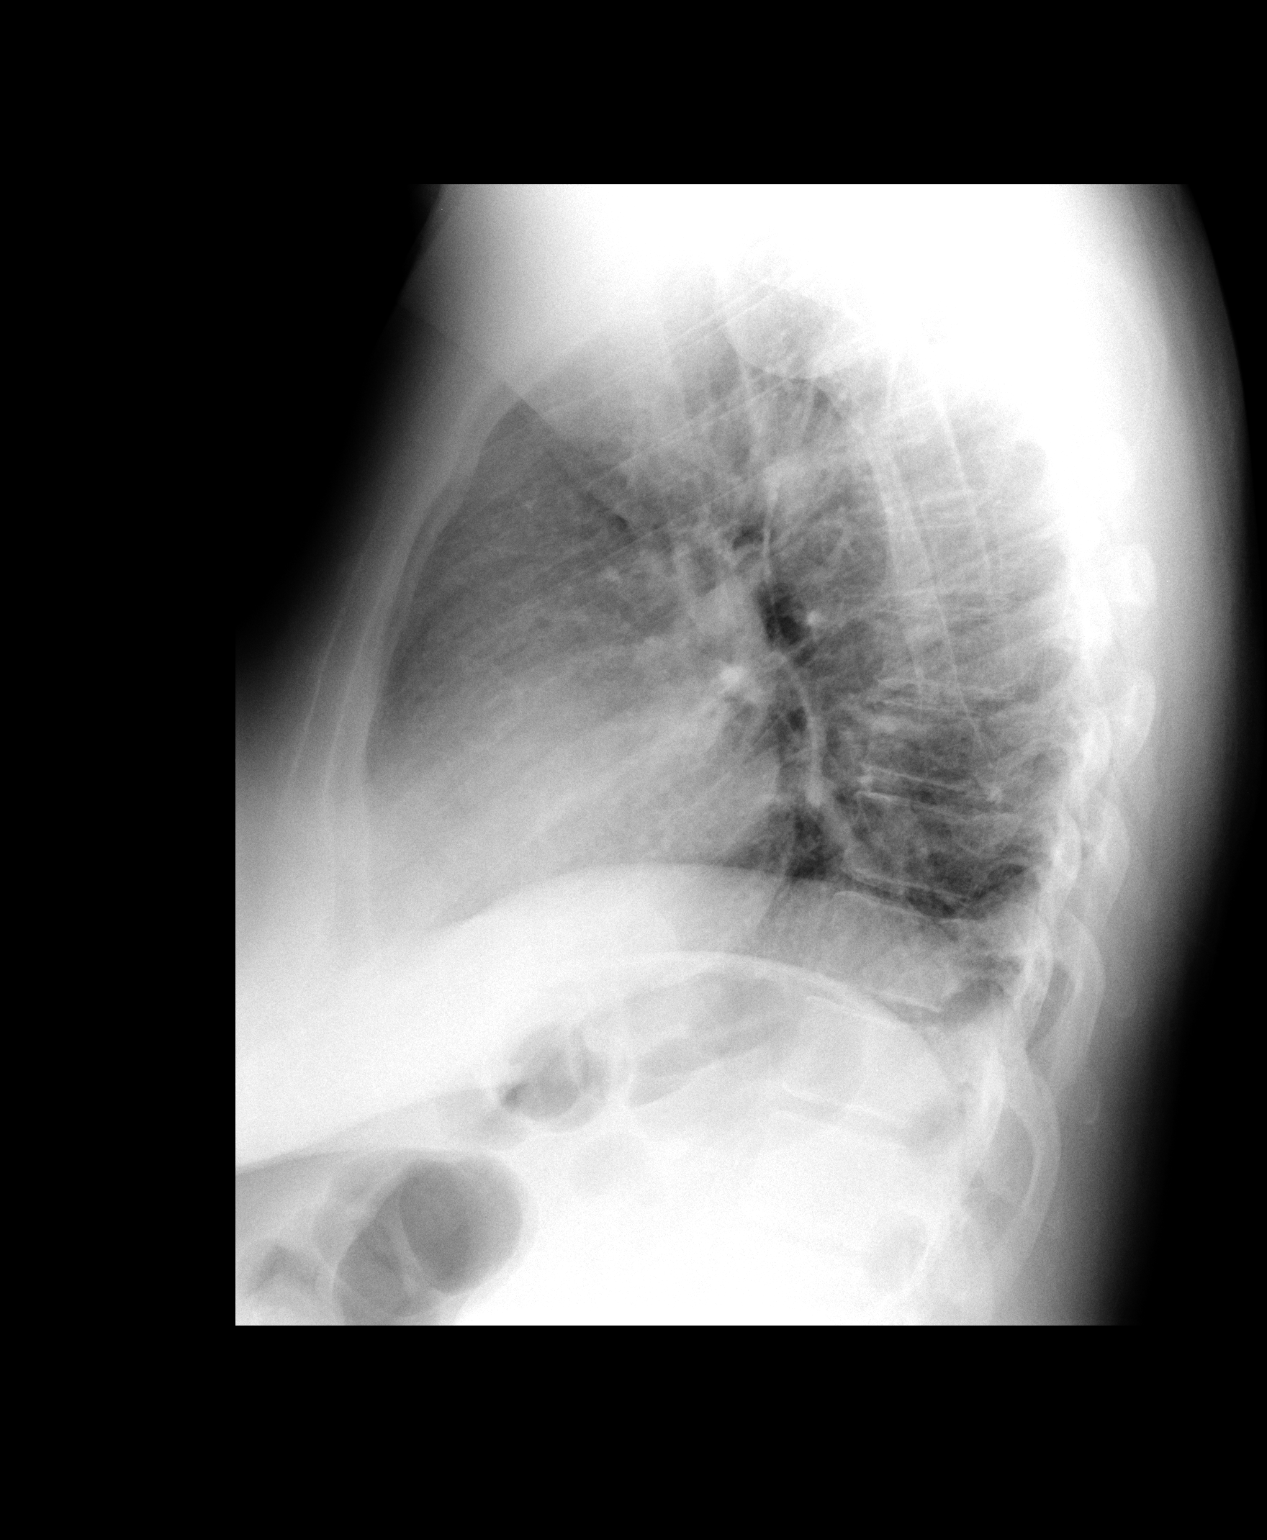

[2 of 2 positions shown; findings below may reference images not displayed]

FINDINGS: There is mild cardiac enlargement.

No pleural effusion or pulmonary edema noted.

There is no airspace densities identified.
IMPRESSION: 1.  No active cardiopulmonary disease.

## 2008-11-25 ENCOUNTER — Ambulatory Visit: Payer: Self-pay | Admitting: Internal Medicine

## 2008-11-25 DIAGNOSIS — H9209 Otalgia, unspecified ear: Secondary | ICD-10-CM | POA: Insufficient documentation

## 2008-11-25 DIAGNOSIS — R071 Chest pain on breathing: Secondary | ICD-10-CM | POA: Insufficient documentation

## 2008-12-16 ENCOUNTER — Encounter: Payer: Self-pay | Admitting: *Deleted

## 2008-12-16 ENCOUNTER — Encounter: Payer: Self-pay | Admitting: Internal Medicine

## 2008-12-16 ENCOUNTER — Telehealth: Payer: Self-pay | Admitting: Internal Medicine

## 2009-02-11 ENCOUNTER — Telehealth: Payer: Self-pay | Admitting: Internal Medicine

## 2009-02-11 DIAGNOSIS — R0602 Shortness of breath: Secondary | ICD-10-CM

## 2009-02-12 ENCOUNTER — Ambulatory Visit (HOSPITAL_COMMUNITY): Admission: RE | Admit: 2009-02-12 | Discharge: 2009-02-12 | Payer: Self-pay | Admitting: Internal Medicine

## 2009-02-12 IMAGING — CR DG CHEST 2V
2 series · 2 of 2 positions shown · non-contrast
Comparison: [DATE]

CLINICAL DATA: Episodes of extreme shortness of breath.

CHEST - 2 VIEW

[w chest pa]
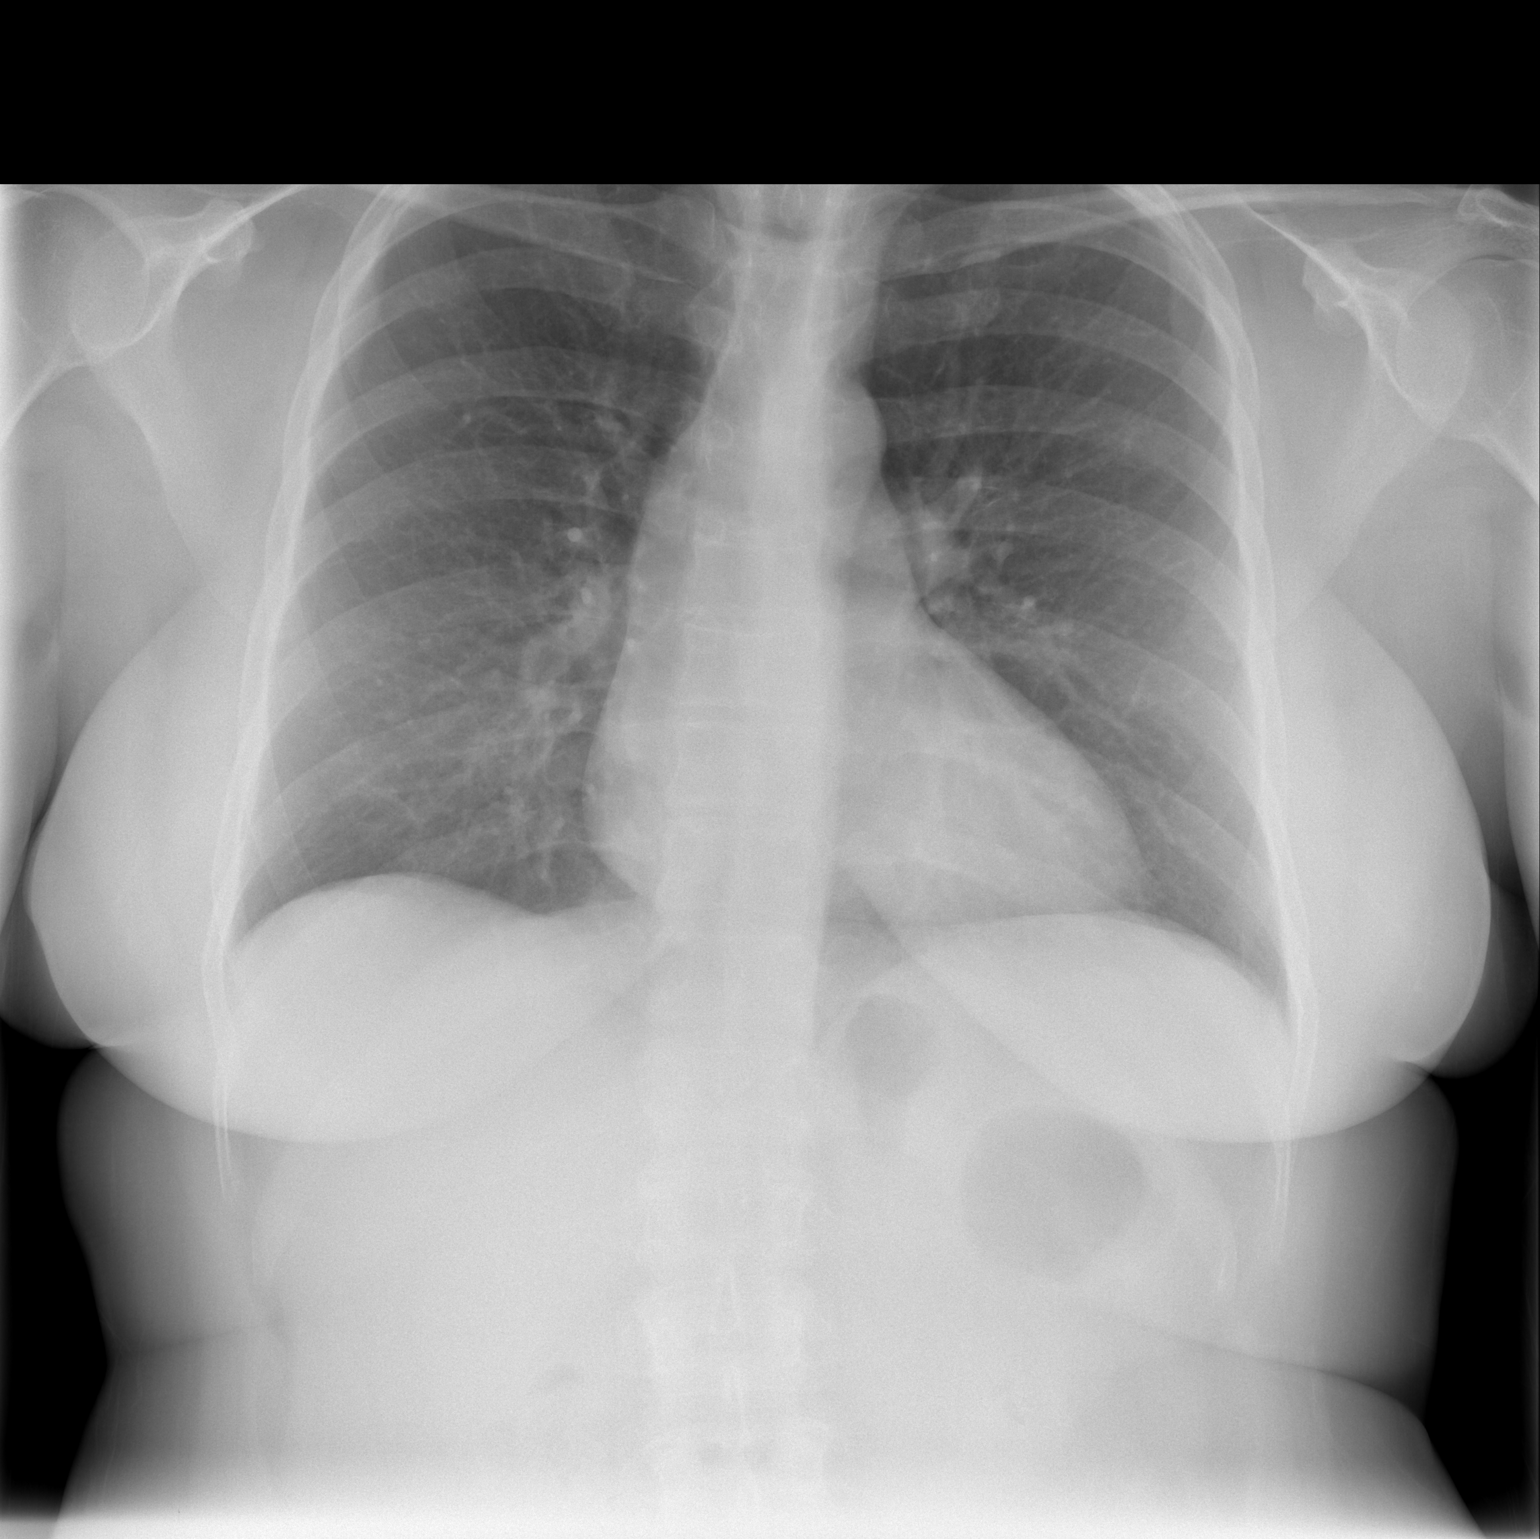

[w chest lat]
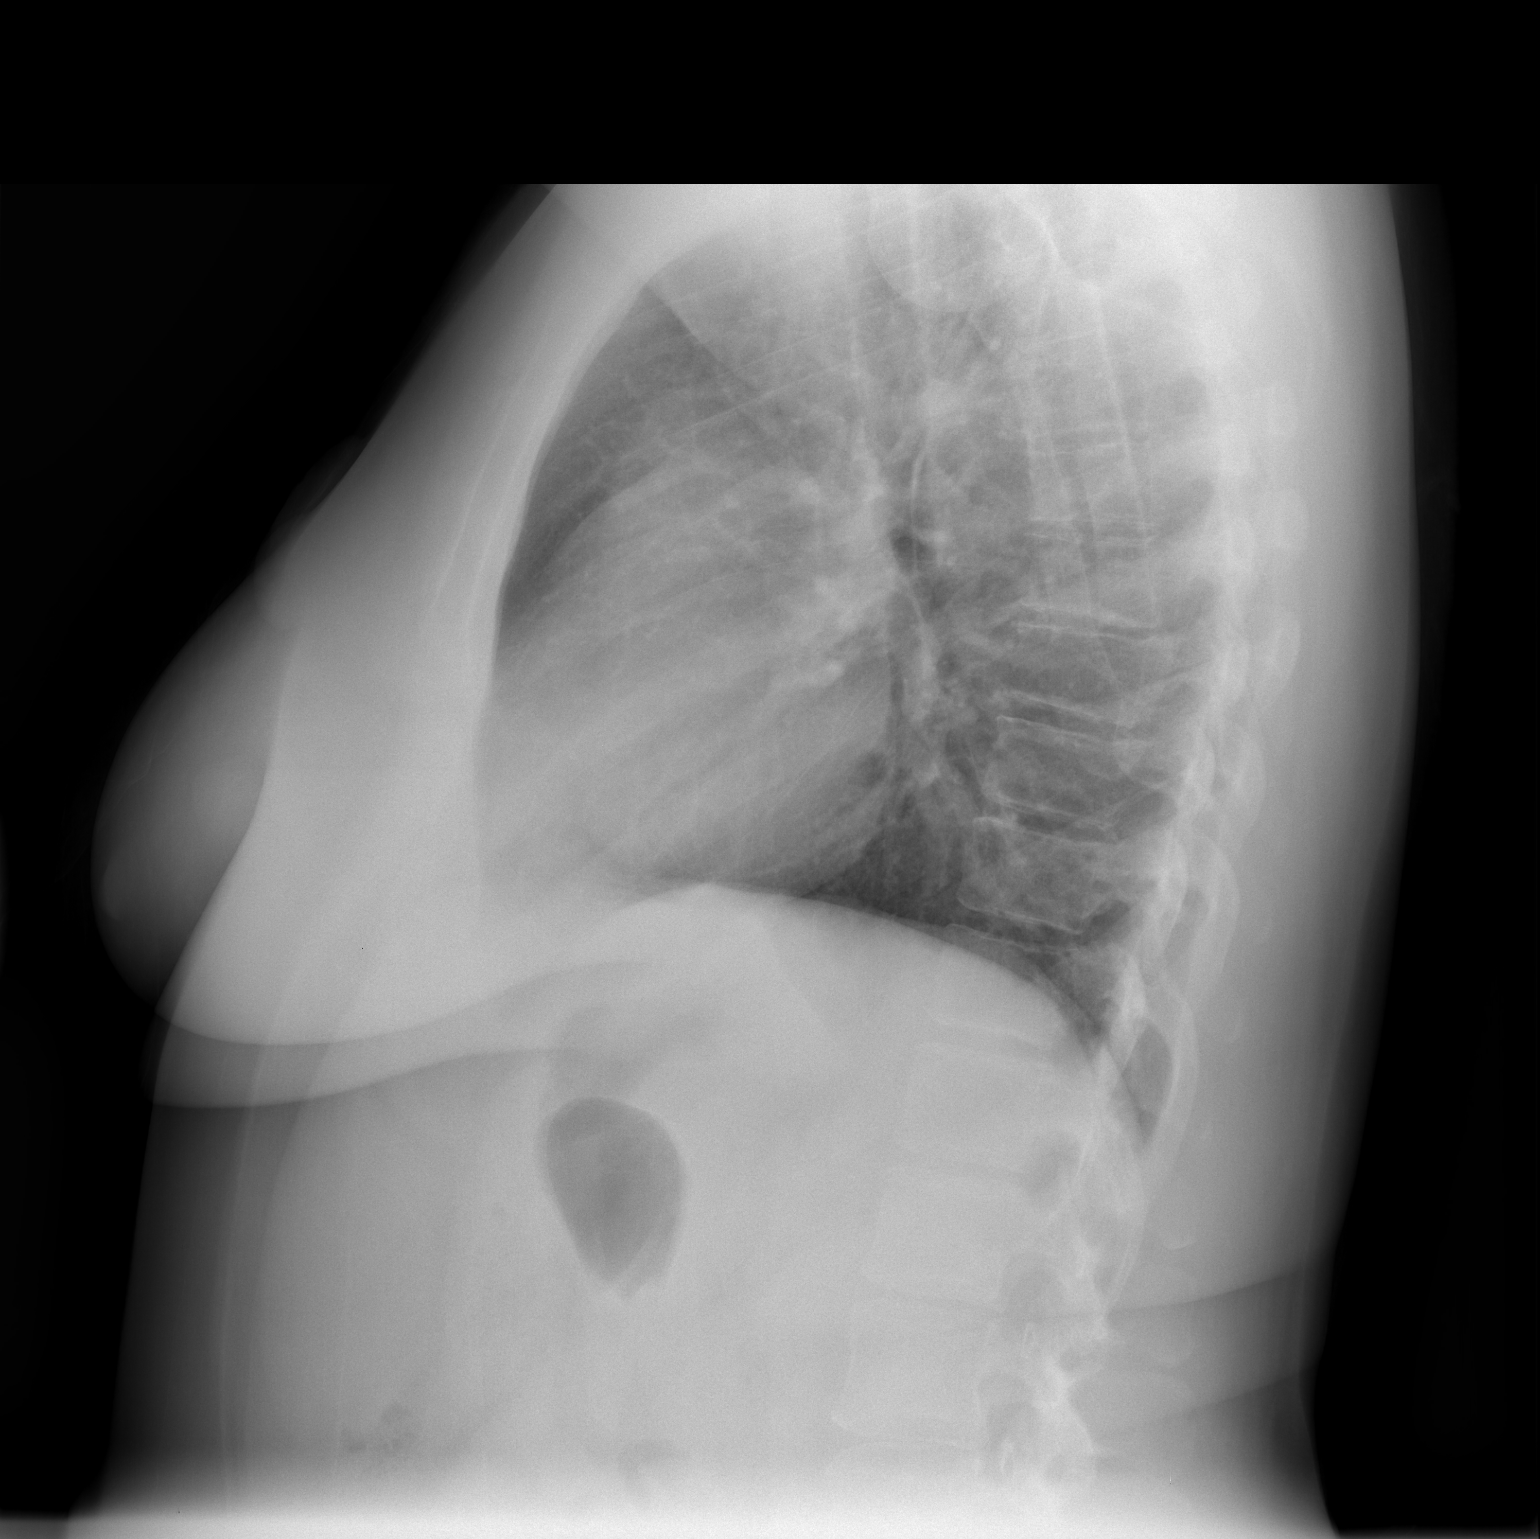

[2 of 2 positions shown; findings below may reference images not displayed]

FINDINGS: Trachea is midline.  Heart size normal.  Lungs are clear.
No pleural fluid.  No pneumothorax.
IMPRESSION: No acute findings.

## 2009-05-16 ENCOUNTER — Ambulatory Visit: Payer: Self-pay | Admitting: Internal Medicine

## 2009-05-16 LAB — CONVERTED CEMR LAB
ALT: 31 units/L (ref 0–35)
AST: 25 units/L (ref 0–37)
Albumin: 4.2 g/dL (ref 3.5–5.2)
Alkaline Phosphatase: 64 units/L (ref 39–117)
Basophils Absolute: 0 10*3/uL (ref 0.0–0.1)
Basophils Relative: 0.3 % (ref 0.0–3.0)
Calcium: 9.4 mg/dL (ref 8.4–10.5)
Eosinophils Relative: 1.5 % (ref 0.0–5.0)
GFR calc non Af Amer: 102.74 mL/min (ref >60)
Glucose, Bld: 92 mg/dL (ref 70–99)
HCT: 39 % (ref 36.0–46.0)
HDL: 33.1 mg/dL — ABNORMAL LOW (ref >39.00)
Hemoglobin: 13.5 g/dL (ref 12.0–15.0)
Ketones, urine, test strip: NEGATIVE
Lymphocytes Relative: 48.6 % — ABNORMAL HIGH (ref 12.0–46.0)
Lymphs Abs: 2.8 10*3/uL (ref 0.7–4.0)
Monocytes Relative: 6 % (ref 3.0–12.0)
Neutro Abs: 2.6 10*3/uL (ref 1.4–7.7)
Nitrite: NEGATIVE
Potassium: 3.9 meq/L (ref 3.5–5.1)
Protein, U semiquant: NEGATIVE
RBC: 4.28 M/uL (ref 3.87–5.11)
RDW: 12.4 % (ref 11.5–14.6)
Sodium: 142 meq/L (ref 135–145)
TSH: 0.92 microintl units/mL (ref 0.35–5.50)
Total CHOL/HDL Ratio: 6
Total Protein: 7.3 g/dL (ref 6.0–8.3)
Urobilinogen, UA: 0.2

## 2009-07-23 ENCOUNTER — Ambulatory Visit: Payer: Self-pay | Admitting: Internal Medicine

## 2009-07-23 DIAGNOSIS — E785 Hyperlipidemia, unspecified: Secondary | ICD-10-CM | POA: Insufficient documentation

## 2009-12-13 ENCOUNTER — Emergency Department (HOSPITAL_COMMUNITY): Admission: EM | Admit: 2009-12-13 | Discharge: 2009-12-13 | Payer: Self-pay | Admitting: Family Medicine

## 2009-12-13 IMAGING — CR DG TIBIA/FIBULA 2V*L*
3 series · 3 of 3 positions shown · non-contrast
Comparison: None

CLINICAL DATA: Left lower leg injury and pain.

LEFT TIBIA AND FIBULA - 2 VIEW

[view not recorded (1 of 3)]
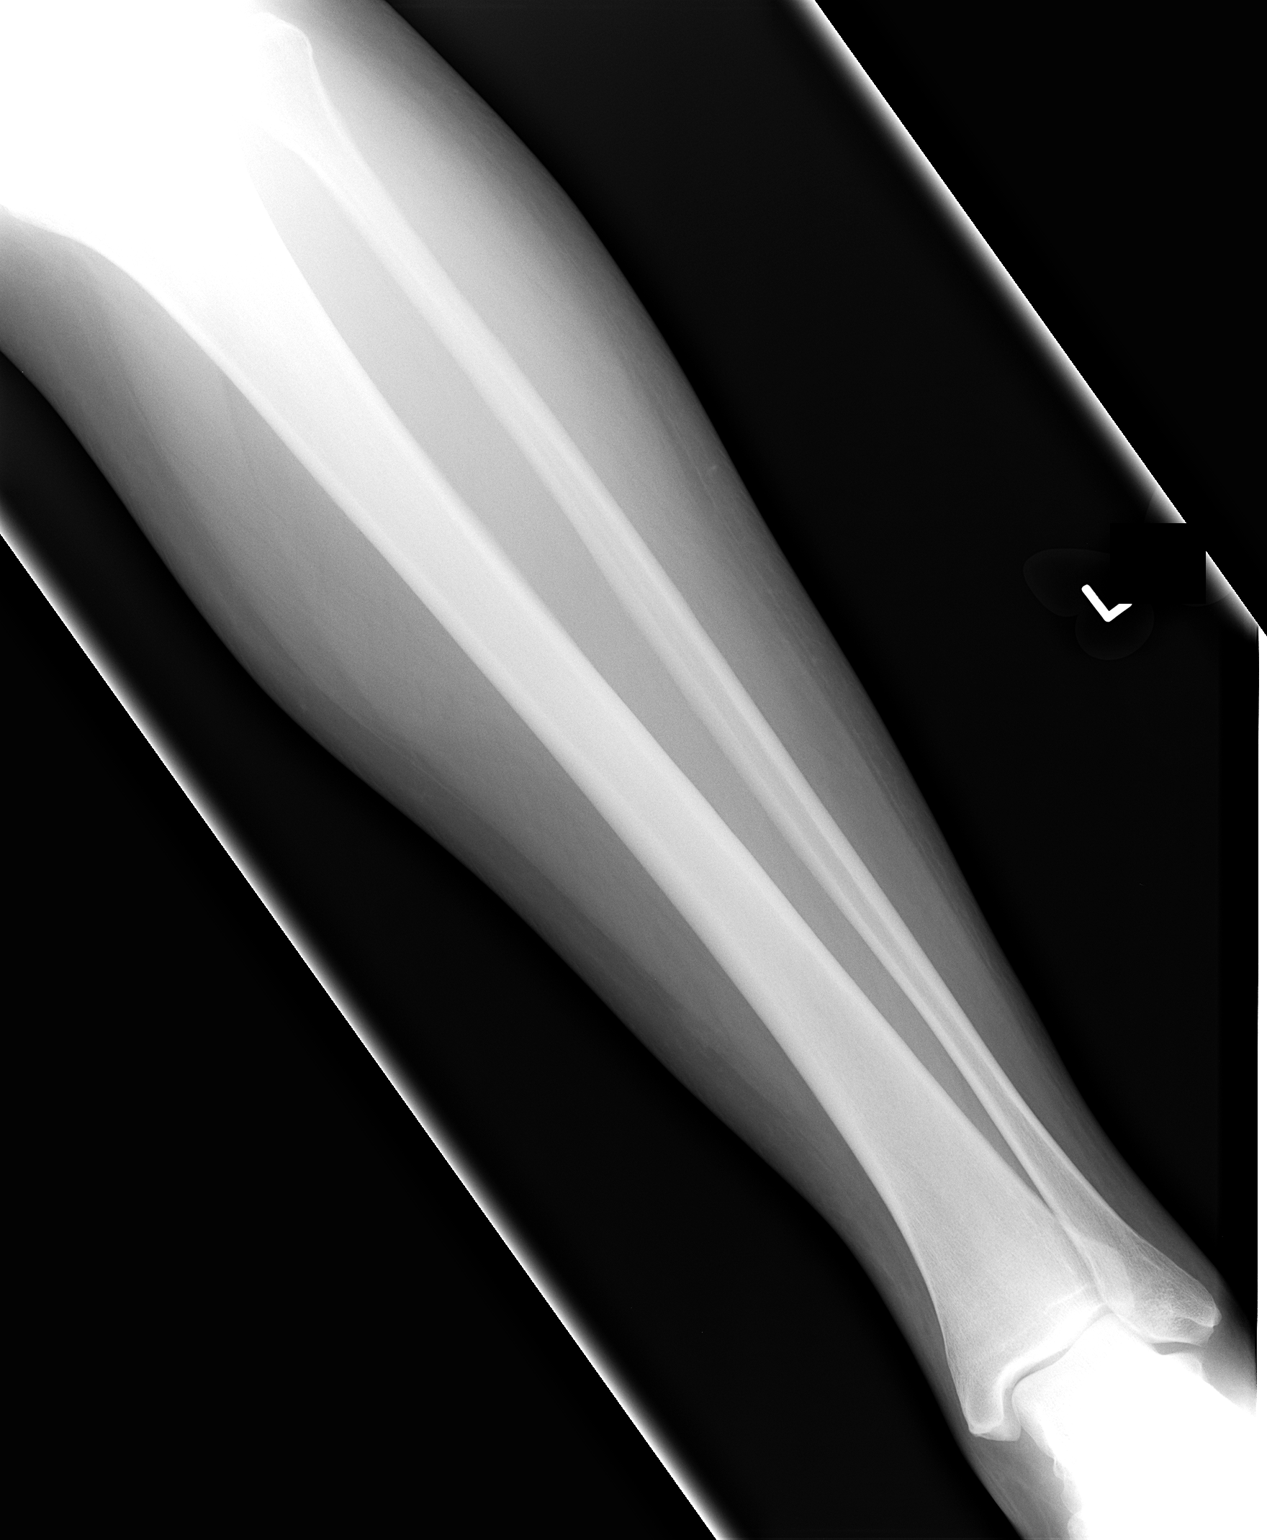

[view not recorded (2 of 3)]
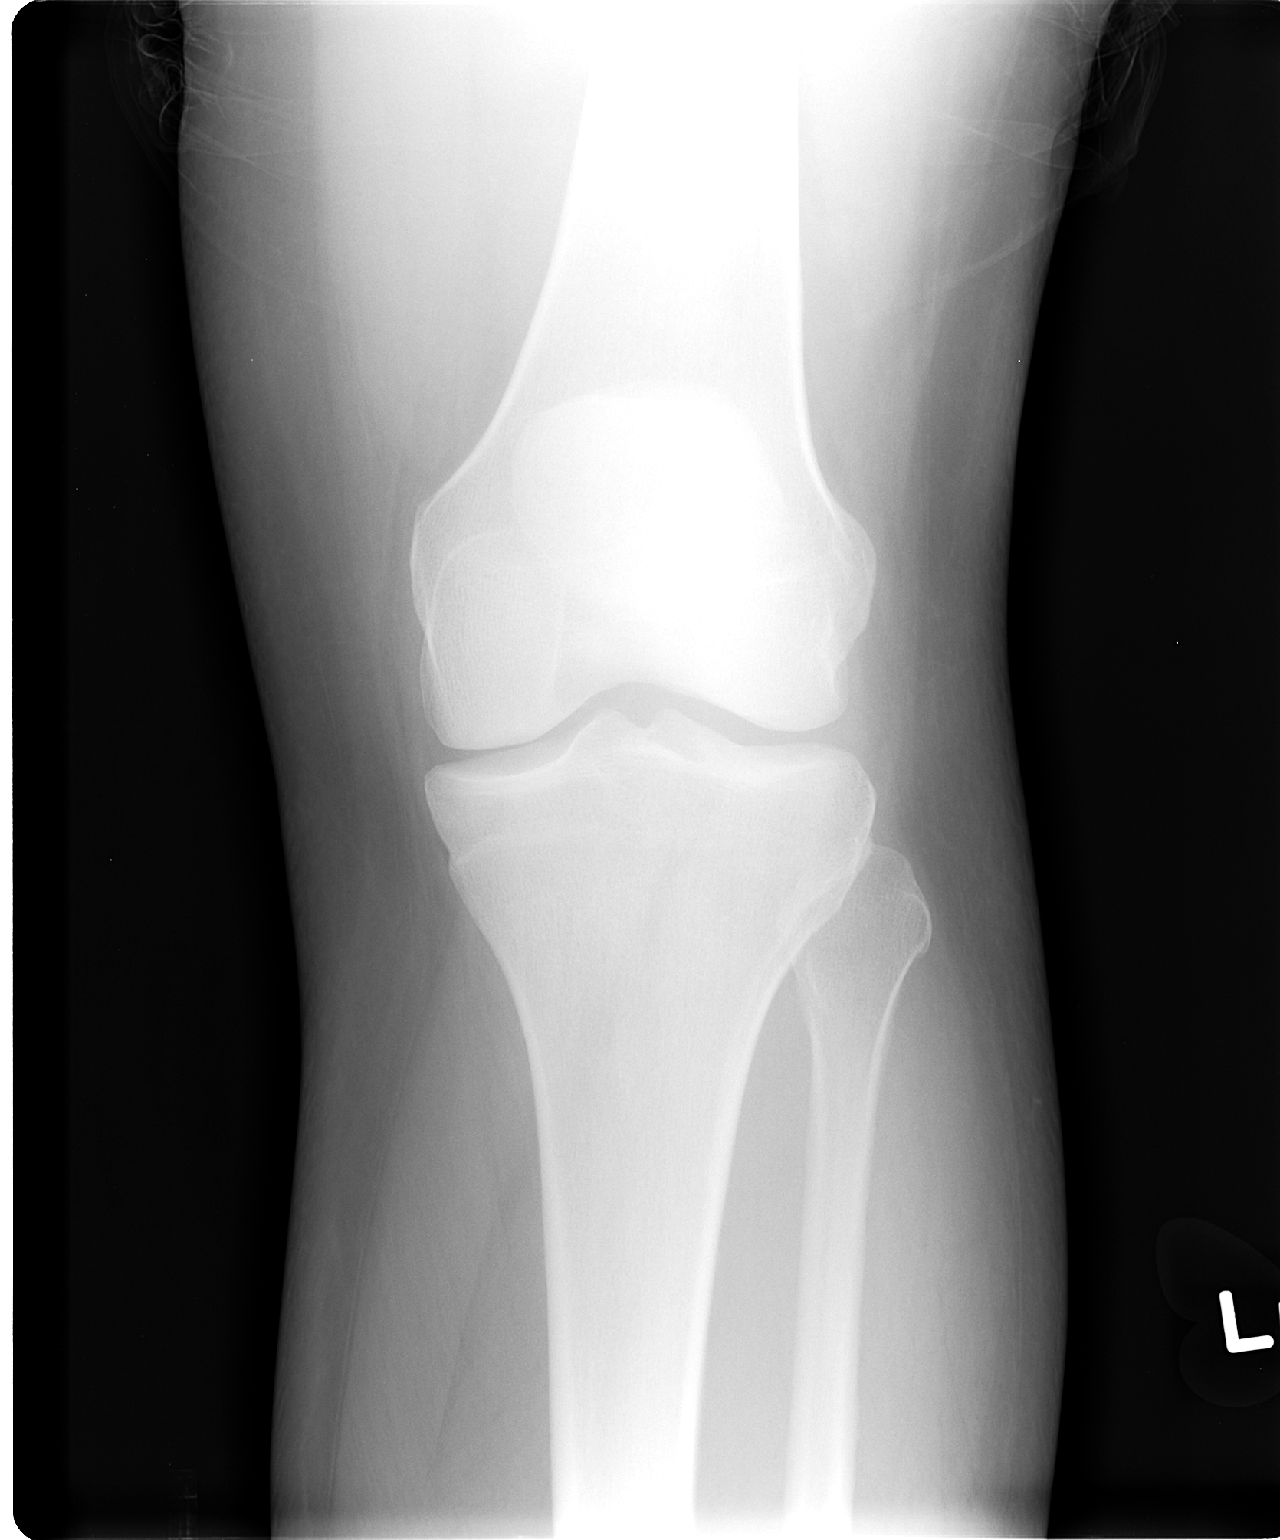

[view not recorded (3 of 3)]
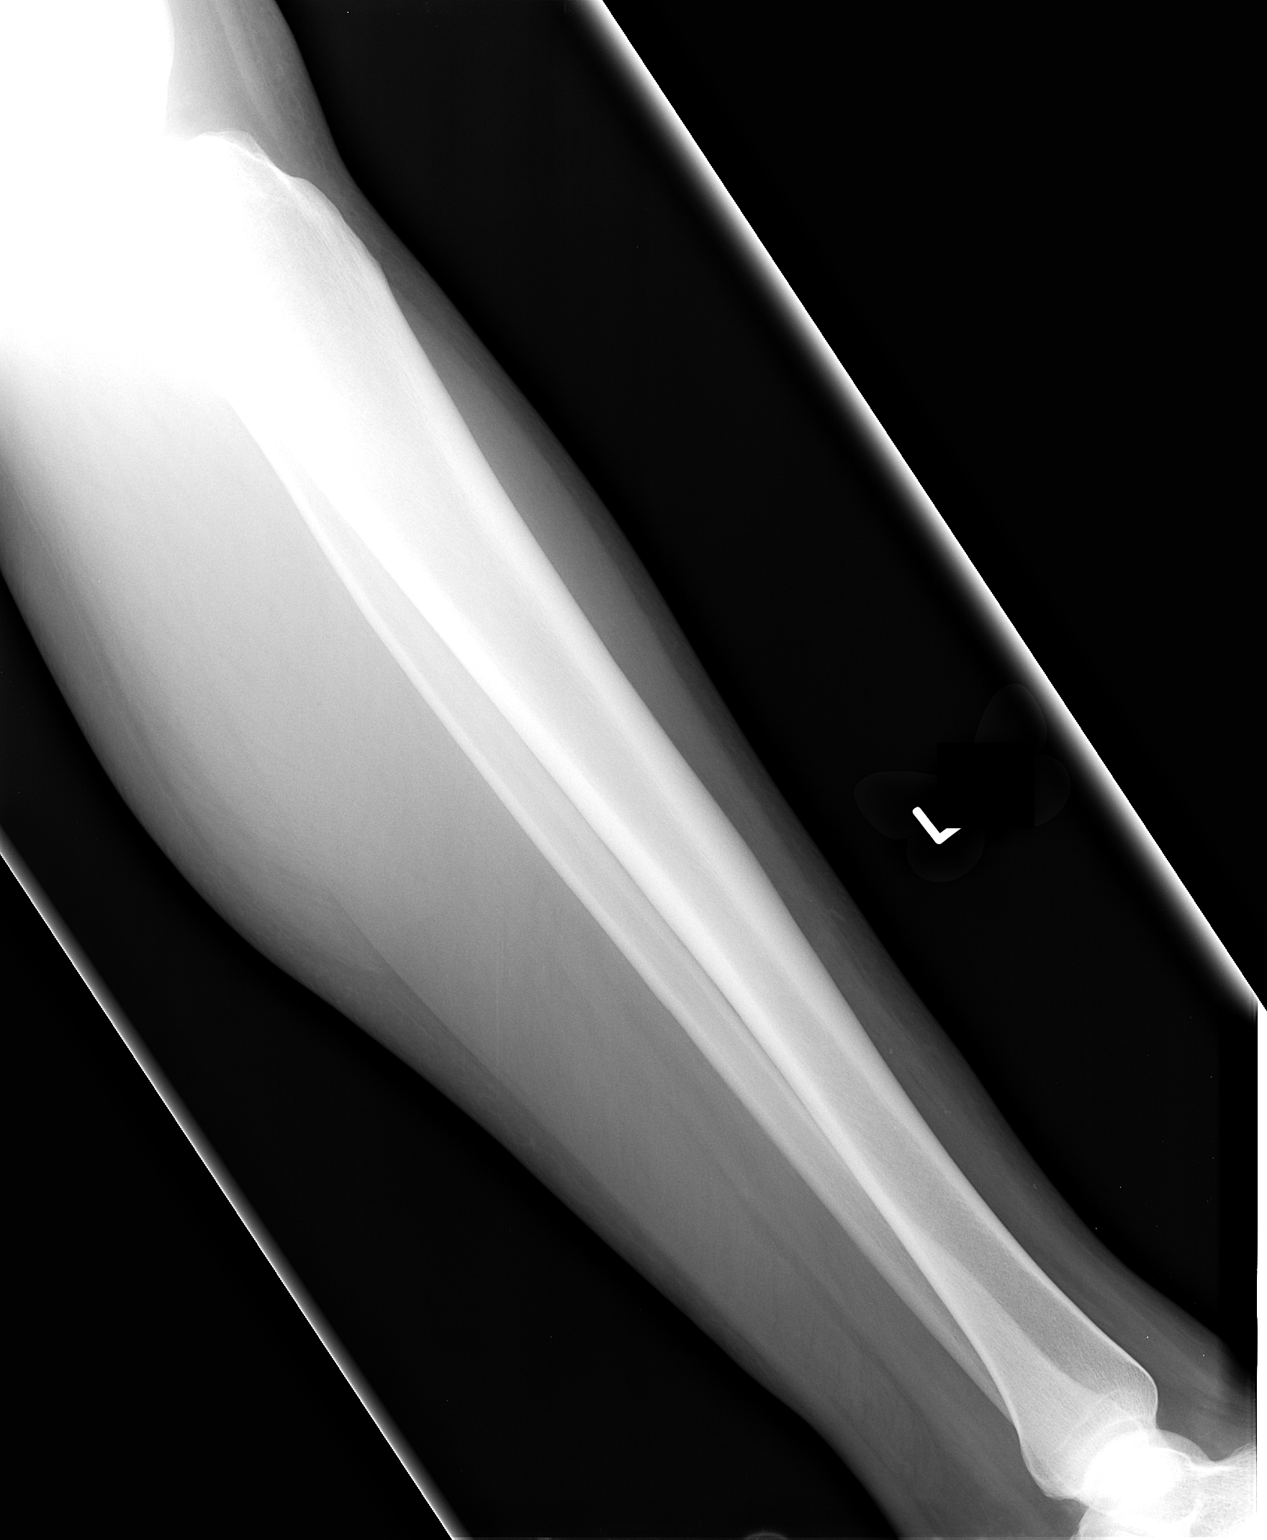

[3 of 3 positions shown; findings below may reference images not displayed]

FINDINGS: No evidence of acute fracture, subluxation or dislocation
identified.

No radio-opaque foreign bodies are present.

No focal bony lesions are noted.

The joint spaces are unremarkable.
IMPRESSION: Unremarkable examination.

## 2010-10-24 ENCOUNTER — Inpatient Hospital Stay (INDEPENDENT_AMBULATORY_CARE_PROVIDER_SITE_OTHER)
Admission: RE | Admit: 2010-10-24 | Discharge: 2010-10-24 | Disposition: A | Discharge status: Home / Self Care | Payer: 59 | Source: Ambulatory Visit | Attending: Emergency Medicine | Admitting: Emergency Medicine

## 2010-10-24 DIAGNOSIS — B9789 Other viral agents as the cause of diseases classified elsewhere: Secondary | ICD-10-CM

## 2010-12-28 ENCOUNTER — Encounter (HOSPITAL_COMMUNITY): Payer: Self-pay | Admitting: Radiology

## 2010-12-28 ENCOUNTER — Emergency Department (HOSPITAL_COMMUNITY): Payer: 59

## 2010-12-28 ENCOUNTER — Emergency Department (HOSPITAL_COMMUNITY)
Admission: EM | Admit: 2010-12-28 | Discharge: 2010-12-29 | Disposition: A | Discharge status: Home / Self Care | Payer: 59 | Attending: Emergency Medicine | Admitting: Emergency Medicine

## 2010-12-28 ENCOUNTER — Inpatient Hospital Stay (INDEPENDENT_AMBULATORY_CARE_PROVIDER_SITE_OTHER)
Admission: RE | Admit: 2010-12-28 | Discharge: 2010-12-28 | Disposition: A | Discharge status: Home / Self Care | Payer: 59 | Source: Ambulatory Visit | Attending: Family Medicine | Admitting: Family Medicine

## 2010-12-28 DIAGNOSIS — R109 Unspecified abdominal pain: Secondary | ICD-10-CM | POA: Insufficient documentation

## 2010-12-28 DIAGNOSIS — I10 Essential (primary) hypertension: Secondary | ICD-10-CM | POA: Insufficient documentation

## 2010-12-28 HISTORY — DX: Essential (primary) hypertension: I10

## 2010-12-28 LAB — DIFFERENTIAL
Basophils Absolute: 0 10*3/uL (ref 0.0–0.1)
Basophils Relative: 0 % (ref 0–1)
Eosinophils Absolute: 0.1 10*3/uL (ref 0.0–0.7)
Lymphocytes Relative: 46 % (ref 12–46)
Lymphs Abs: 3.7 10*3/uL (ref 0.7–4.0)
Monocytes Relative: 5 % (ref 3–12)
Neutro Abs: 3.8 10*3/uL (ref 1.7–7.7)

## 2010-12-28 LAB — HEPATIC FUNCTION PANEL
ALT: 21 U/L (ref 0–35)
Alkaline Phosphatase: 96 U/L (ref 39–117)
Bilirubin, Direct: 0.1 mg/dL (ref 0.0–0.3)
Indirect Bilirubin: 0.5 mg/dL (ref 0.3–0.9)

## 2010-12-28 LAB — POCT URINALYSIS DIP (DEVICE)
Nitrite: NEGATIVE
Specific Gravity, Urine: >=1.03 (ref 1.005–1.030)
Urobilinogen, UA: 0.2 mg/dL (ref 0.0–1.0)
pH: 5.5 (ref 5.0–8.0)

## 2010-12-28 LAB — POCT I-STAT, CHEM 8
BUN: 10 mg/dL (ref 6–23)
Creatinine, Ser: 0.8 mg/dL (ref 0.4–1.2)
Glucose, Bld: 76 mg/dL (ref 70–99)
Hemoglobin: 15.6 g/dL — ABNORMAL HIGH (ref 12.0–15.0)
Sodium: 143 mEq/L (ref 135–145)
TCO2: 28 mmol/L (ref 0–100)

## 2010-12-28 LAB — CBC
Hemoglobin: 14.2 g/dL (ref 12.0–15.0)
MCH: 30.5 pg (ref 26.0–34.0)
MCHC: 35.6 g/dL (ref 30.0–36.0)
MCV: 85.6 fL (ref 78.0–100.0)
RBC: 4.66 MIL/uL (ref 3.87–5.11)

## 2010-12-28 IMAGING — CT CT ABD-PELV W/ CM
2 of 5 series · 17 of 46 positions shown, 19 images · IV contrast (APPLIED)
Comparison: CT [DATE]

CLINICAL DATA: Abdominal pain and right flank pain

CT ABDOMEN AND PELVIS WITH CONTRAST
TECHNIQUE: Multidetector CT imaging of the abdomen and pelvis was
performed following the standard protocol during bolus
administration of intravenous contrast.
Contrast: 100 ml [3G] IV

[Series 2: abd/pelv with 5.0 b31f st · axial · 0.71mm/px · z∈[-336,+78]mm · 14 of 93 slices shown, 16 images]
[im 5/93  soft-tissue]
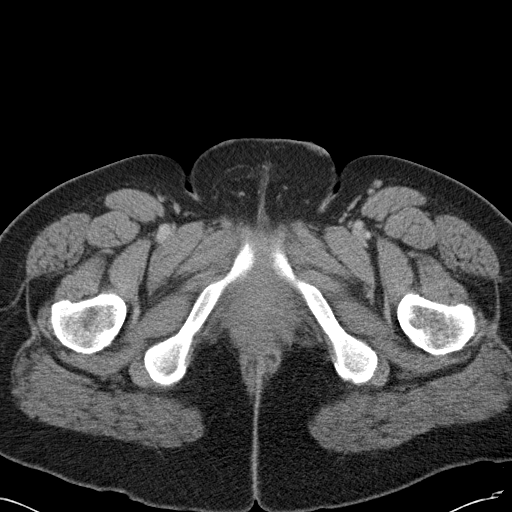
[im 5/93  bone]
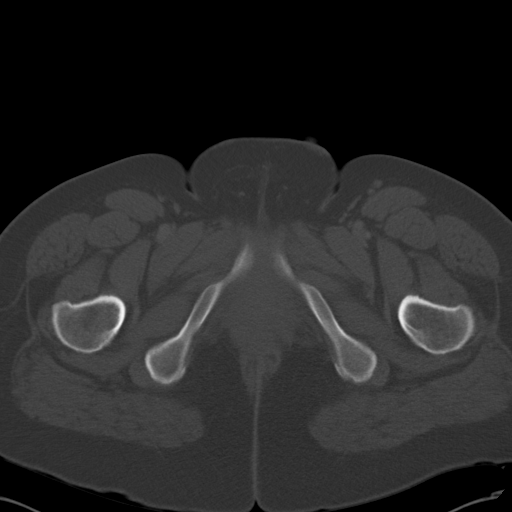
[im 14/93  soft-tissue]
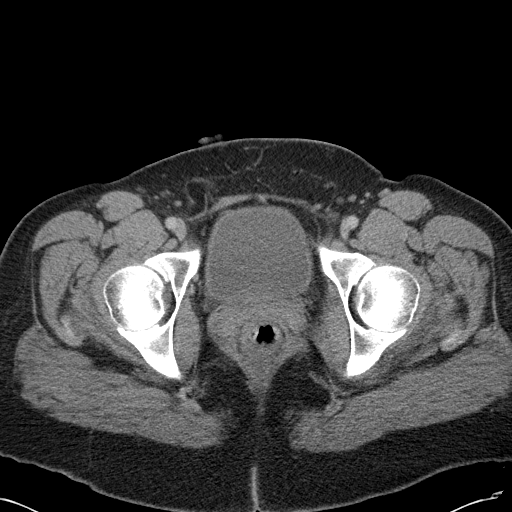
[im 19/93  soft-tissue]
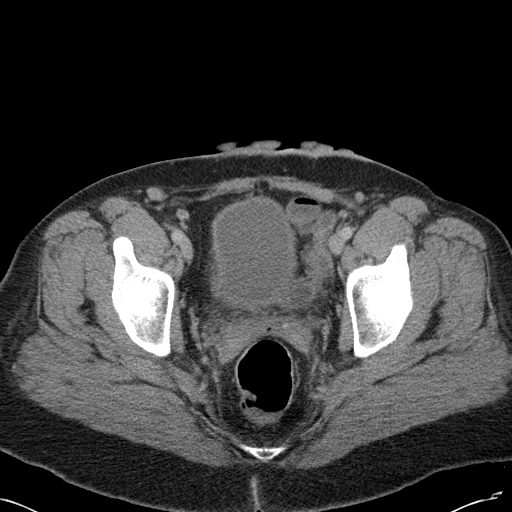
[im 24/93  soft-tissue]
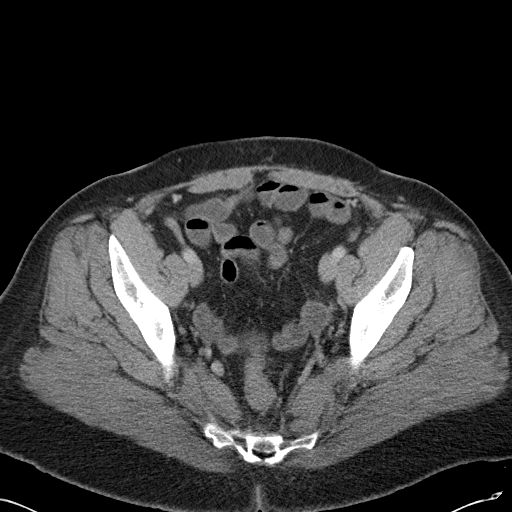
[im 33/93  soft-tissue]
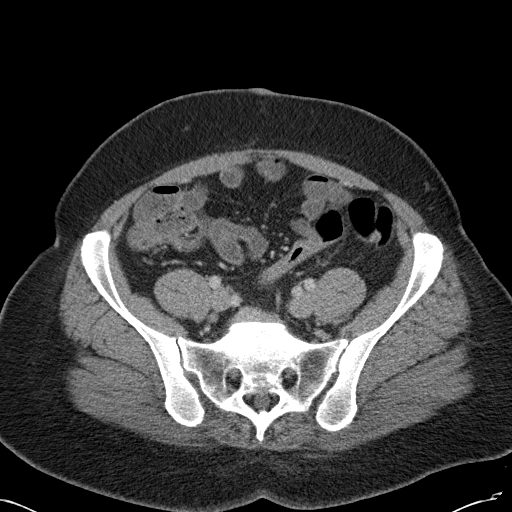
[im 37/93  soft-tissue]
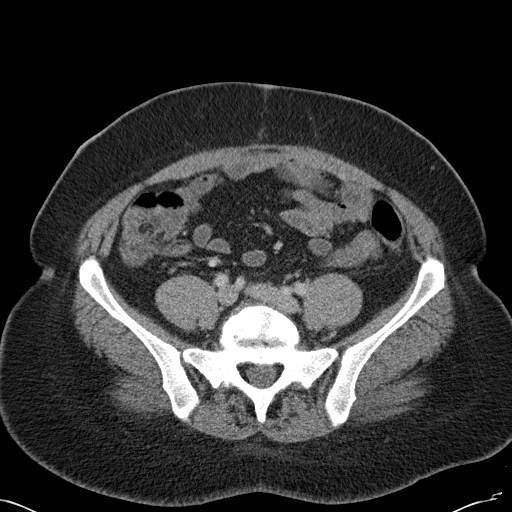
[im 42/93  soft-tissue]
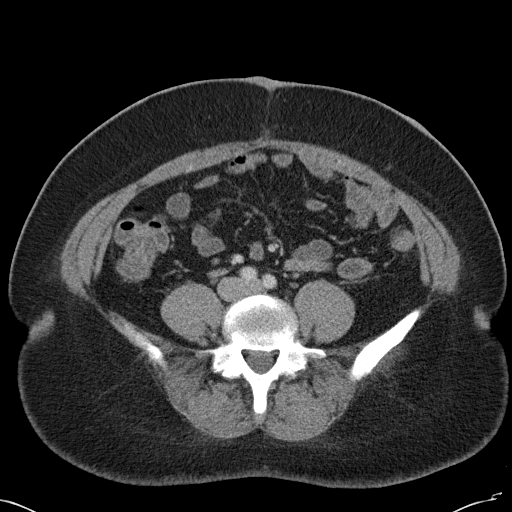
[im 51/93  soft-tissue]
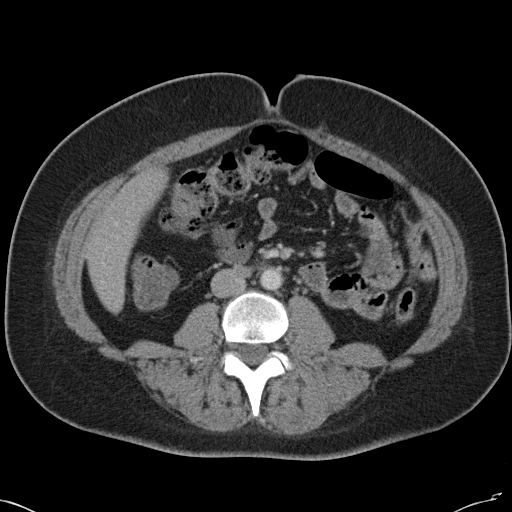
[im 56/93  soft-tissue]
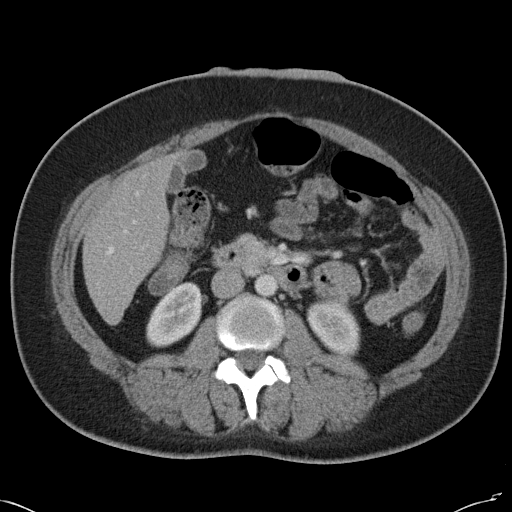
[im 56/93  bone]
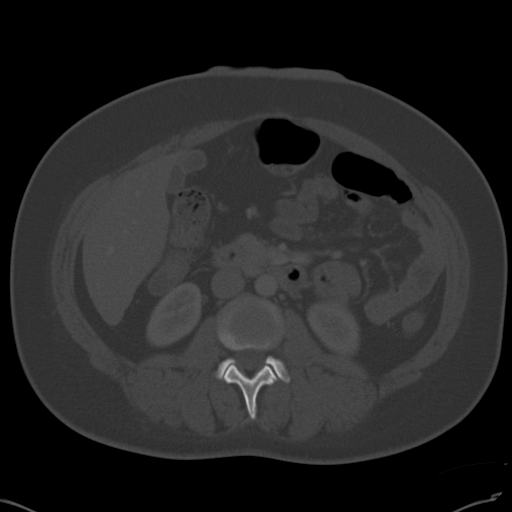
[im 60/93  soft-tissue]
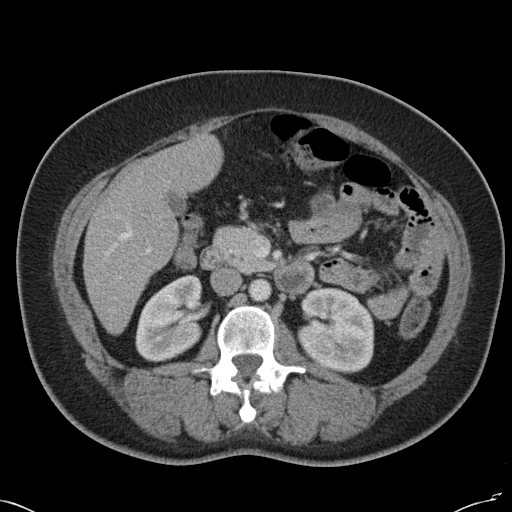
[im 70/93  soft-tissue]
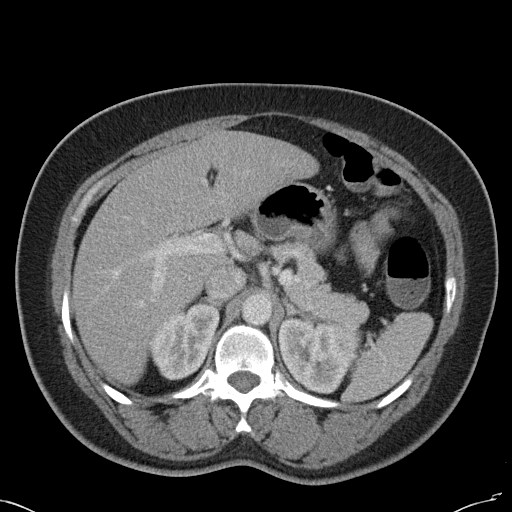
[im 74/93  soft-tissue]
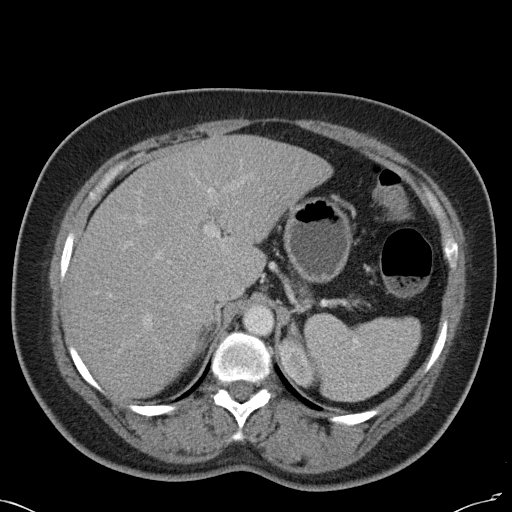
[im 79/93  soft-tissue]
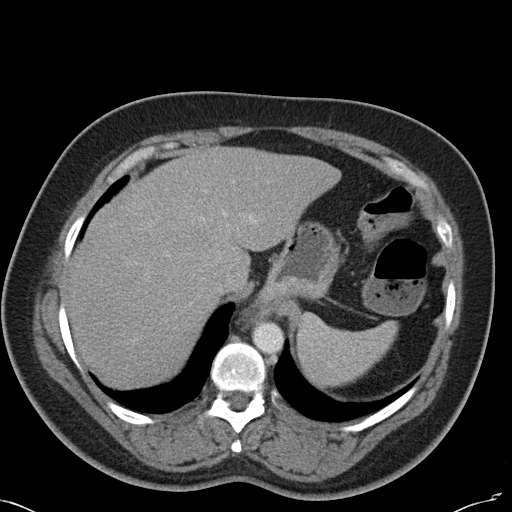
[im 88/93  soft-tissue]
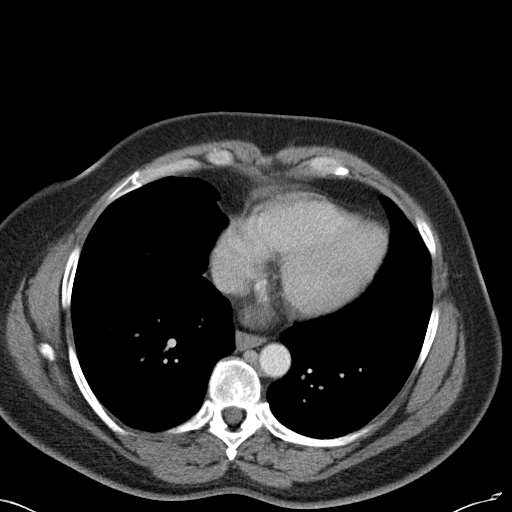

[Series 602: cor · coronal · 0.91mm/px · 3 of 136 slices shown]
[im 46/136  soft-tissue]
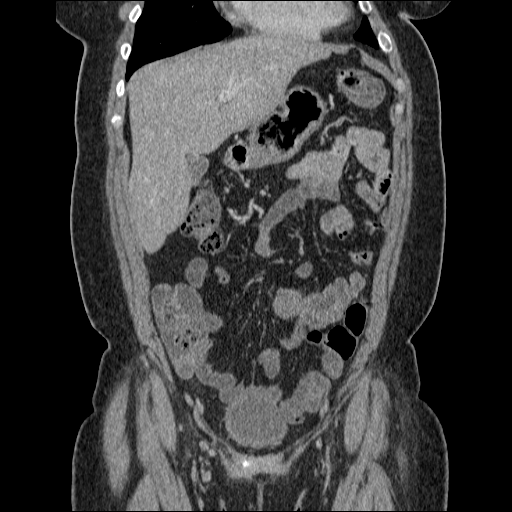
[im 61/136  soft-tissue]
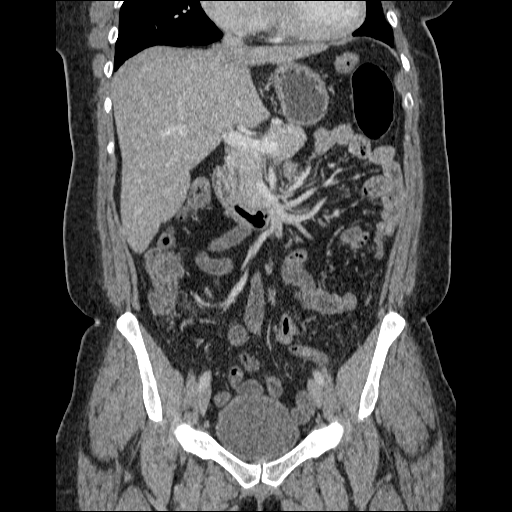
[im 76/136  soft-tissue]
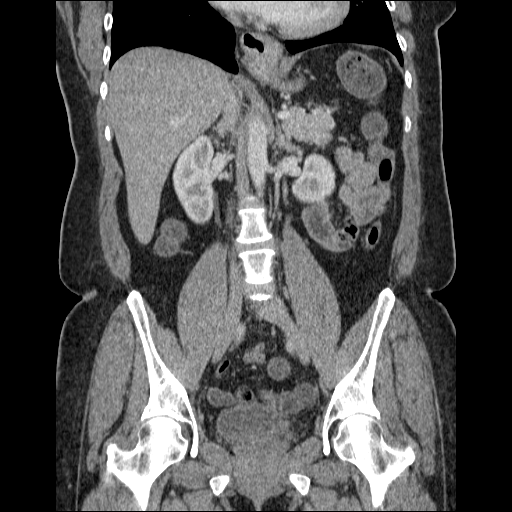

[17 of 46 positions shown; findings below may reference images not displayed]

FINDINGS: Large pelvic mass has been removed since the prior study.

Lung bases are clear.  Heart is mildly enlarged.  Small hiatal
hernia is present.

Liver gallbladder and bile ducts are normal.  Pancreas spleen the
kidneys are normal.  No adenopathy is present.

Negative for bowel obstruction.  No bowel thickening or edema is
present.  No free fluid.  Appendix is normal.

Multiple areas of polypoid thickening of the skin are  noted
overlying the anterior pelvis.  These may be due to keloid
formation or other skin lesion such as neurofibromas.
IMPRESSION: Cardiac enlargement hiatal hernia.  No acute abnormality in the
abdomen.

## 2010-12-28 MED ORDER — IOHEXOL 300 MG/ML  SOLN
100.0000 mL | Freq: Once | INTRAMUSCULAR | Status: AC | PRN
  Administered 2010-12-28: 100 mL via INTRAVENOUS

## 2010-12-29 ENCOUNTER — Telehealth: Payer: Self-pay | Admitting: Internal Medicine

## 2010-12-29 NOTE — Telephone Encounter (Signed)
Talked with pt and xray and ct was done at ed,but could find no cause for pain- was given pain med and pt will take and instructed to call back in a couple of days if not better

## 2010-12-29 NOTE — Telephone Encounter (Signed)
Pt went to ED last night for lower rt back pain,nausea,dizzyness. Pt says that ED gave pt med for nausea and hydrocodone for pain, but did not know what was causing problem. Pt req call from Dr Lovell Sheehan nurse.

## 2011-01-01 NOTE — Op Note (Signed)
[facility]  Patient:    [name], [name] Visit Number: [id number] MRN: [id number]          Service Type: OBS Location: [id number] Attending Physician:  Abelardo Diesel. Date: 04/21/01 Admit Date:  04/17/2001                             Operative Report  PREOPERATIVE DIAGNOSIS:       Severe pregnancy-induced hypertension at                               35 weeks with impending HELLP syndrome.  POSTOPERATIVE DIAGNOSIS:      1. Severe pregnancy-induced hypertension                                  at 35 weeks with impending HELLP syndrome.                               2. Delivery of viable female infant; Apgars                                  7 and 8.  OPERATION:                    Primary low flap transverse cesarean section.  SURGEON:                      Miguel Aschoff, M.D.  ANESTHESIA:                   Spinal.  COMPLICATIONS:                None.  JUSTIFICATION:                The patient is a 40 year old black female, gravida 6, para 1, 0, 4, 1 admitted on April 17, 2001 for pregnancy-induced hypertension at 34 weeks.  The patient was managed with IV magnesium sulfate ; however, on September 05 the patients blood pressure became progressively worse with elevations reaching as high as 190/120.  In addition patient developed mild elevation in her liver function tests and it was the she was now having impending HELLP syndrome.  In view of her severe PIH she is being taken now to undergo deliver via cesarean section and the management of her severe elevation of her blood pressure in the ICU.  DESCRIPTION OF PROCEDURE:     Patient was brought to the operating room, placed in a sitting position and spinal anesthesia was administered without difficulty.  She was then placed in a supine position, deviated to the left, and prepped and draped in the usual sterile fashion.  Foley catheter was inserted.  Pfannenstiel incision was made  extending down through the subcutaneous tissue with bleeding points being clamped and coagulated as they were encountered. The fascia was then identified, incised transversely and this was then separated from the underlying rectus muscle.  Rectus muscle was divided in the midline.  The peritoneum was then identified and entered  carefully avoiding the underlying structures.  Peritoneal incision was then extended under direct visualization.  Bladder flap was then created and protected with  a bladder blade.  An elliptical transverse incision was made into the lower uterine segment.  The amniotic cavity was then entered.  Clear fluid was obtained and the patient was then delivered of a viable female infant with Apgars of 7 at one minute and 8 at five minutes from a vertex OA position.  The baby was handed to the neonatal team in attendance and appeared to be vigorous weighing between 5.5 and 6 pounds.  Cord pH was obtained; was 7.31.  The placenta was delivered after cord bloods had been obtained.  Then the uterus was closed.  The angles of the uterine incision were ligated using figure-of-eight sutures of #1 Vicryl and the uterus was closed in layers; the first layer was a running interlocking suture of #1 Vicryl followed by an imbricating suture of #1 Vicryl.  The bladder flap was then reapproximated using running continuous 2-0 Vicryl suture.  At this point inspection was made.  There appeared to be good hemostasis.  Lap counts were taken and found to be correct.  Then the abdomen was irrigated with warm saline.  Then the parietal peritoneum was closed using running continuous 0-Vicryl suture.  Rectus muscles were reapproximated using running continuous 0-Vicryl suture.  The fascia was closed using two sutures of 0-Vicryl starting at the bladder fascial angles and meeting in the midline. The subcutaneous tissue and skin were closed using staples after her previous Pfannenstiel scar, which  had large keloid, was excised.  The estimated blood loss was approximately 600 cc.  Patient tolerated the procedure well.  Again, the plan is to put the patient on magnesium sulfate therapy and blood pressure management in the AICU. Attending Physician:  Mickle Mallory DD:  04/21/01 TD:  04/21/01 Job: [id number]

## 2011-01-01 NOTE — Op Note (Signed)
[facility]  Patient:    [name]                  MRN: [id number] Proc. Date: 09/25/99 Adm. 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  POSTOPERATIVE DIAGNOSIS:      Mature keratoma on the right ovary.  OPERATION:                    Right ovarian cystectomy.  SURGEON:                      Richard D. Arlyce Dice, M.D.  ASSISTANT:                    Marcelle Overlie, M.D.  ANESTHESIA:                   General endotracheal.  ESTIMATED BLOOD LOSS:         100 cc.  FINDINGS:                     Three separate 2-3 cm dermoid cysts presenting in the right ovary.  INDICATIONS:                  This is a 40 year old gravida 4, para 1, AB 4, who on evaluation for abdominal pain was found to have a dermatoid cyst identified by fatty deposits and discrete calcifications on flat plate of the abdomen, and confirmed with CT scan.  The findings were discussed with the patient who had had a previous laparoscopic oophorectomy 12 years ago for a dermoid cyst on the left ovary, and the decision was made to proceed with right ovarian cystectomy to preserve hormonal function and fertility.  DESCRIPTION OF PROCEDURE:     The patient was brought to the operating room and  general anesthesia was induced.  She was then placed in the modified dorsal lithotomy position, and the abdomen, perineum, and vagina prepped and draped in a sterile fashion.  A HUMI catheter was placed in the uterus to manipulate the uterus and aid in exposure.  The bladder was catheterized.  A small 5 cm incision was ade transversely in the suprapubic area, and carried down through the fascia which as then extended transversely.  The rectus muscle was bluntly dissected and the peritoneum was entered bluntly, and extended bluntly.  Using the [facility] manipulator, the  uterus and right ovary was brought into the operative field.  The right ovary was grasped with a Babcock clamp and the cyst was brought through the incision. An 18-gauge needle was then used to aspirate fluid from the cyst until the ovary could be presented through the minilaparotomy incision.  Excess fluid was clear.  An incision was made over the large cyst and the tissue was dissected.  The cyst wall was dissected free from the ovarian tissue.  There were three other dermoid cysts noted.  Each one was dissected free from the ovarian capsule and removed.                                At this point, the ovarian tissue was  reopposed with 3-0 and 4-0 Vicryl suture, and the ovarian capsule was closed in all cases with a baseball suture of 3-0 to 4-0 Vicryl suture.  The fimbria appeared perfectly normal at this time, and the uterus was free and mobile.  The Interceed was placed over the ovarian capsule to decrease the risks of adhesion.  The peritoneum was not closed.  The fascia was closed with running 0 Vicryl suture.  The previous keloid scar was dissected free and the fresh skin edges were reopposed with staples.                                The patient tolerated the procedure well and left the operating room in good condition. DD:  09/25/99 TD:  09/27/99 Job: [id number]

## 2011-01-01 NOTE — Op Note (Signed)
[facility]  Patient:    [name], DIVER Visit Number: [id number] MRN: [id number]          Service Type: DSU Location: [facility] Attending Physician:  Eloise Levels Dictated by:   Mary A. Contogiannis, M.D. Proc. Date: 10/19/01 Admit Date:  10/19/2001                             Operative Report  PREOPERATIVE DIAGNOSIS:  Right breast keloid.  POSTOPERATIVE DIAGNOSIS:  Right breast keloid.  PROCEDURE:  Excision of 4.0 cm right breast keloid.  SURGEON:  Mary A. Contogiannis, M.D.  ANESTHESIA:  1% lidocaine with epinephrine.  COMPLICATIONS:  None.  INDICATION FOR PROCEDURE:  The patient is a 40 year old African-American female who presents with several keloids.  In particular, one is on her lower sternum and one is on her right breast.  They are becoming symptomatic, and she would like to see about excising them.  I have recommended that we try excising the one on the right breast first, as this is hidden better.  She therefore requests that we proceed with that surgery at this time.  DESCRIPTION OF PROCEDURE:  The patient was brought to the minor room and placed on the table in supine position.  The right breast was prepped with Betadine and draped in a sterile fashion.  The skin and subcutaneous tissues in the area of the keloid were then injected with 1% lidocaine with epinephrine.  After adequate hemostasis and anesthesia had taken effect, the procedure was begun.  The area of the keloid was then marked, and I took a 1 cm margin of normal skin around it.  Upon entering the subcutaneous tissues, it was apparent that the keloided scar tissue did extend a little further. All of the keloidal material was removed.  This totalled approximately 4.0 cm in length.  Hemostasis was obtained with the cautery.  The wound was then closed using 3-0 Monocryl interrupted dermal sutures followed by 4-0 Monocryl running intracuticular stitch on the  skin.  The incision was dressed with benzoin and a Steri-Strip.  There were no complications.  The patient tolerated the procedure well.  The patient was then taught proper wound care and discharged home in stable condition.  Follow-up appointment will be tomorrow in the office. Dictated by:   Mary A. Contogiannis, M.D. Attending Physician:  Eloise Levels DD:  10/19/01 TD:  10/20/01 Job: [phone]

## 2011-01-01 NOTE — Discharge Summary (Signed)
[facility]  Patient:    [name], [name] Visit Number: [id number] MRN: [id number]          Service Type: OBS Location: 910A 9115 [id number] Attending Physician:  Mickle Mallory Dictated by:   Marcelle Overlie, M.D. Admit Date:  04/17/2001 Discharge Date: 04/25/2001                             Discharge Summary  ADMISSION DIAGNOSES:          1. Intrauterine pregnancy at 33-4/7 weeks.                               2. Preeclampsia.  DISCHARGE DIAGNOSES:          1. Intrauterine pregnancy at 33-47 weeks, status                                  post primary low transverse cesarean section.                               2. Preeclampsia.  HISTORY OF PRESENT ILLNESS:   The patient is a 40 year old, gravida 6, para 1, at 33-4/7 weeks, who presented complaining of vaginal spotting for one day. She also complained of a headache for two to three days.  She has a history of preeclampsia in 1992 with her first pregnancy and had a stillborn at that time.  She had an ultrasound on November 02, 2000, with an [facility] of May 27, 2001.  PHYSICAL EXAMINATION:         On admission, her blood pressure was 163/95. Her physical exam was significant for a reassuring fetal heart rate tracing.  PELVIC:                       The cervix was closed, soft, and vertex -3.  EXTREMITIES:                  She had 2+ peripheral edema.  LABORATORY DATA:              A urinalysis revealed 1+ proteinuria.  HOSPITAL COURSE:              The patient was diagnosed with preeclampsia and was placed on magnesium sulfate.  An ultrasound was performed.  The patient was also given dexamethasone.  During the hospitalization, the patient remained on bed rest, however, her blood pressure on April 20, 2001, was elevated to 190/120.  The fetal heart rate became tachycardic with a loss of variability.  The PIH panel did show some mild LFT elevation.  It was felt that the patient was developing  HELPP syndrome.  The patient was taken to the operating room for a primary low transverse cesarean section.  The cord pH at that time was 7.31.  The patient went to the ICU postoperatively and did develop HELPP syndrome.  She was monitored closely.  She did develop a temperature.  Antibiotics were started for probable endometritis.  The patient was placed on labetalol for blood pressure control.  On postoperative day #4, her blood pressure was much better controlled.  DISPOSITION:                  She was  discharged home in good condition.  FOLLOW-UP:                    She will follow up in the office in one week for a blood pressure check.  ACTIVITY:                     She was advised no driving for two to three weeks.  DISCHARGE MEDICATIONS:        She was given a prescription for labetalol, Tylox, and Motrin.  SPECIAL INSTRUCTIONS:         She was advised to call if she has any nausea, vomiting, abdominal pain, or redness and erythema from her incision site. Dictated by:   Marcelle Overlie, M.D. Attending Physician:  Mickle Mallory DD:  05/17/01 TD:  05/17/01 Job: [id number]

## 2011-01-01 NOTE — Discharge Summary (Signed)
[facility]  Patient:    [name]                  MRN: [id number] Adm. Date:  [id number] Disc. Date: [id number] Attending:  Lars Pinks Dictator:   Leilani Able, P.A.                           Discharge Summary  no dictation DD:  10/12/99 TD:  10/12/99 Job: [id number]

## 2011-01-01 NOTE — Consult Note (Signed)
[name], [name]              ACCOUNT NO.:  [id number]   MEDICAL RECORD NO.:  [id number]          PATIENT TYPE:  OUT   LOCATION:  GYN                          FACILITY:  [facility]   PHYSICIAN:  Paola A. Duard Brady, MD    DATE OF BIRTH:  1971/06/01   DATE OF CONSULTATION:  01/26/2005  DATE OF DISCHARGE:                                   CONSULTATION   DATE OF CONSULTATION:  01/26/2005.   REFERRING PHYSICIAN:  Dr. Malva Limes.   The patient was seen today in consultation at the request of Dr. Malva Limes.  [name] is a very pleasant 40 year old gravida 5, para 2-1-2-  2 who comes in accompanied with her sister today.  [name] was in her  usual state of health until approximately 2 months ago, when she noticed an  increase in abdominal girth.  She thought it was recurrence of her abdominal  hernia with some mild discomfort.  Acutely on Saturday, May 20th, she  experienced acute-onset abdominal pain and was taken to her local emergency  room.  It was felt that she most likely had appendicitis.  However, she had  a CT scan of the abdomen and pelvis on May 21 in the early morning, which  revealed a large mass extending from the pelvis into the lower abdomen.  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  There was mild bibasilar  atelectasis and infiltrate.  The liver, spleen, pancreas, kidneys and  adrenal glands were otherwise normal.  There was no retroperitoneal  mesenteric ascites.  She subsequently underwent exploratory laparotomy,  TAH/RSO for this 19-x-11-x-15-cm mass.  Operative findings included a large  amount of ascites.  The patient also had a large, infarcted, deeply purple  right ovary which appeared to be twisted on its pedicle approximately four  times.  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  There was a 10.5-cm solid and cystic mass within the right  adnexa.  The  majority of the lesion was comprised of a mature cystic teratoma.  However,  there was a minute 2-mm focus of primitive neuro endothelium, which was felt  to be a grade 1 immature teratoma.  There was no cytology sent for washings.  The cervix was unremarkable, as was the remainder of the hysterectomy  specimen.  She comes in today for evaluation of that.  She states she is  overall doing fairly well.  She has not returned to work.  She describes her  discomfort as only a soreness, and she takes about two pain pills per day.  She is trying to wean herself off so that she can return to work.  She  denies any change in bowel or bladder habits.  She occasionally has some  mild spotting which is worse when she does a lot of walking.   PAST MEDICAL HISTORY:  None.   PAST SURGICAL HISTORY:  Right hand surgery, dermoid cystectomy of the left  ovary many years ago, left-sided ectopic with a salpingectomy,  umbilical  hernia repair, cesarean section times one and TAH/RSO as above.   ALLERGIES:  None.   MEDICATIONS:  Tarca 5 mg daily.   SOCIAL HISTORY:  She is married.  She has a 1-year-old son and a 37 year old  daughter.  She denies use of tobacco.  She drinks alcohol occasionally.  She  is a Merchandiser, retail.   FAMILY HISTORY:  Maternal grandmother with colon cancer as well as a history  of dermoid cyst.  Maternal aunt with breast cancer which was postmenopausal.  Mother with hypertension and maternal grandfather with myocardial  infarction.  Her father has hypertension.  Paternal grandmother with chronic  renal insufficiency who died of complications related to that and  hemodialysis.  Paternal grandfather with lung cancer.  He was a smoker.   PHYSICAL EXAMINATION:  VITAL SIGNS:  Weight 206 pounds.  Blood pressure  140/90, pulse 84, height 6 feet 1 inches.  GENERAL APPEARANCE:  A well-nourished, well-developed female in no acute  distress.  NECK:  Supple.  There is no  lymphadenopathy, no thyromegaly.  LUNGS:  Clear to auscultation bilaterally.  CARDIOVASCULAR:  Regular rate and rhythm.  ABDOMEN:  She has multiple surgical incisions and prominent keloids at a  transverse supraumbilical incision at her Pfannenstiel incision.  Her  vertical midline incision is healing well.  There are healing blisters on  the right side of the incision.  The abdomen is diffusely tender.  There is  no distinct mass.  On bimanual examination there is some mild cuff  tenderness but no masses.   ASSESSMENT:  A 40 year old with a grade 1 immature teratoma.  I discussed  with her what the origin of germ cell tumors was and their unusual and rare  incidence.  In addition, I discussed with her the difference between a grade  1, a grade 2 and a grade 3 mature teratoma.  I discussed with her if she had  a grade 2 or a grade 3 immature teratoma she would require chemotherapy with  bleomycin, etoposide and cisplatin.  However, since she has a grade 1,  surgical staging would be recommended to rule out the possibility of  peritoneal implants or lymphatic invasion.  She understands that this will  need to be done with a surgical procedure, and while she is amenable to  doing this, she states she needs a little bit more time to recover from this  surgery before she can psychologically face a new surgery.  In addition, she  would like to have a second opinion on the pathology to ensure that there is  no evidence of grade 2 or grade 3 neuroendocrine elements that would  subsequently predicate the need for chemotherapy and make surgery  unnecessary and unrequired.  We will coordinate this effort and have the  pathology sent to [facility] for review.  I will then contact the patient with a  second opinion pathology review and will determine her disposition pending  that.   I did attempt to call Dr. Malva Limes.  He was in the operating room, and he knows to call us.       PAG/MEDQ  D:   01/26/2005  T:  01/26/2005  Job:  [id number]   cc:   Malva Limes, M.D.  [address]  [place]  [place] [address]  Fax: [phone]   Telford Nab, R.N.  [phone] [address]  [place], [place] [address]

## 2011-01-01 NOTE — Discharge Summary (Signed)
[name], [name]              ACCOUNT NO.:  [id number]   MEDICAL RECORD NO.:  [id number]          PATIENT TYPE:  INP   LOCATION:  9320                          FACILITY:  [facility]   PHYSICIAN:  Malva Limes, M.D.    DATE OF BIRTH:  1970-11-24   DATE OF ADMISSION:  01/03/2005  DATE OF DISCHARGE:  01/06/2005                                 DISCHARGE SUMMARY   PRINCIPAL DISCHARGE DIAGNOSES:  1.  Torsion of right ovary.  2.  Grade I immature teratoma.   PRINCIPAL PROCEDURES:  Total abdominal hysterectomy with right salpingo-  oophorectomy.   HISTORY OF PRESENT ILLNESS:  Ms. Nam is a 40 year old black female, G5,  P0, 3-2-2, who presented to [facility] on Jan 02, 2005 at 1400  hours with the onset of acute right lower quadrant pain.  The patient was  worked up in the emergency room and had a CT scan,which revealed a large  ovarian cyst, approximately 20 x 12 cm, consistent with dermoid.  The  patient did state that approximately 15 years ago, she had a left salpingo-  oophorectomy for a dermoid cyst.  In the emergency room, the patient was in  severe pain and had received 20 mg of Dilaudid prior to transfer to [facility].   The patient was transferred to [facility], where she was evaluated and  found to have an acute abdomen. Prior to the procedure, an extensive  discussion was undertaken with the patient, explaining the likely need for  removal of her remaining ovary.  This would leave her with no ovaries or  follicle and therefore, she would have to have oocyte donation in order to  be pregnant.  The possibility of hysterectomy was discussed thoroughly with  the patient.  She stated that she had no desire to have any more children  and if an oophorectomy was needed, she would want a hysterectomy at the same  time.  The patient underwent exploratory laparotomy through a vertical  incision. On entering the abdomen, the patient was noted to have an  infarcted  large left ovary and cyst with maroon colored serous fluid in the  abdomen.  During removal of the ovarian cyst, the patient's ovary was  ruptured. The patient underwent a total abdominal hysterectomy with right  salpingo-oophorectomy, without complications.  The patient had no evidence  of any endometriosis or nodularity throughout the pelvis or bowel.  There  were adhesions in the pelvis from the past surgery.  The diaphragm was not  examined.  At the time of this dictation, it was discovered that the patient  had a 2 mm focus of immature teratoma and therefore a grade I immature  teratoma.  Prior to discharge, this was not known and the patient was  notified at home about the findings.  She is scheduled to have a consult  with GYN oncology on January 26, 2005.  The patient's postoperative course was  uncomplicated.  She did have one low grade temperature on postoperative day  #1, which was felt to be secondary to atelectasis.  The patient's  postoperative  hemoglobin was 10.0.  At the time of discharge, the patient  had a BM and flatus.  She was eating a regular diet and ambulating without  difficulty.  The incision appeared to be healing well.  The patient has a  history of significant development of keloids and therefore it is planned to  have her see a Engineer, petroleum for possible steroid injections in the  postoperative period.  This will not be done until the GYN oncology consult  is obtained.      MA/MEDQ  D:  01/25/2005  T:  01/25/2005  Job:  [id number]

## 2011-01-01 NOTE — Consult Note (Signed)
NAMESMRITI, BARKOW              ACCOUNT NO.:  [id number]   MEDICAL RECORD NO.:  [id number]          PATIENT TYPE:  OUT   LOCATION:  GYN                          FACILITY:  [facility]   PHYSICIAN:  Paola A. Duard Brady, MD    DATE OF BIRTH:  [birth year]   DATE OF CONSULTATION:  DATE OF DISCHARGE:                                   CONSULTATION   Ms. Barno is a 40 year old gravida 5, para 2, 1-2-2 who underwent a TAH/RSO  for a 19 x 11 x 17 x 15 cm mass, done emergently secondary to acute  abdominal pain.  Her surgery was done on Jan 03, 2005.  The right ovary and  fallopian tube revealed a grade I immature teratoma.  She was initially seen  by [place] on January 26, 2005.  Because she had a grade I immature teratoma, the  only staging findings that were positive would indicate chemotherapy, so she  subsequently underwent a laparoscopic pelvic and periaortic lymph node  dissection, omentectomy, peritoneal biopsies, and staging on February 19, 2005.  Operative findings included no obvious pathologic lymph nodes.  She had  adhesions of the colon to the vaginal cuff, cecum adherent to the pelvic  side wall, and omentum adherent to the anterior abdominal wall.  Final  pathology revealed the omentum to be negative as were 0-10 nodes.  All  peritoneal biopsies were negative, as were the washings.  She comes in today  for a postoperative check.  She was presented at our GYN/oncology  disposition conference, and she was dispositioned with no additional therapy  being required.   She was seen by Dr. Dareen Piano and was recently started on Estratest for  vasomotor symptoms, anxiety-type symptoms, felt to be consistent with  menopausal symptoms.  She states that her husband is also suffering from  anxiety and feels that the stress of all of these events have been hard on  her and her family.  She has returned to work.  She is doing well at work.  She denies the abdominal pain.   REVIEW OF SYSTEMS:  She denies any  chest pain, shortness of breath, nausea,  vomiting, fevers, chills, headaches, or visual changes.  Her findings are  only positive, as above.   PHYSICAL EXAMINATION:  VITAL SIGNS:  Weight 215 pounds, which is up 9  pounds.  Blood pressure 148/100.  GENERAL:  A well-developed and well-nourished female in no acute distress.  ABDOMEN:  She has well-healed surgical incisions.  Abdomen is soft and  nontender.   ASSESSMENT:  A 40 year old with a stage IA, grade I immature teratoma who  needs no further treatment.   PLAN:  She will continue on her Estratest.  If her symptoms do no  significantly improves, she knows she can notify either myself or Dr.  Dareen Piano regarding her symptoms and consideration of anxiolytics may be  indicated.  She will  return to see Dr. Dareen Piano in six months and return to see [place] in one year.  I will probably obtain a CT scan on a yearly basis for the first three years  to  insure no evidence of recurrent disease.  Her questions regarding this  were answered.      Paola A. Duard Brady, MD  Electronically Signed     PAG/MEDQ  D:  03/30/2005  T:  03/30/2005  Job:  [id number]   cc:   Malva Limes, M.D.  [address]  [place]  [place] [address]  Fax: [phone]   Telford Nab, R.N.  [address]  [place], [place] [address]

## 2011-01-01 NOTE — Op Note (Signed)
NAME:  [name], MOMAN              ACCOUNT NO.:  [id number]   MEDICAL RECORD NO.:  [id number]          PATIENT TYPE:  INP   LOCATION:  9320                          FACILITY:  [facility]   PHYSICIAN:  Malva Limes, M.D.    DATE OF BIRTH:  03/14/1971   DATE OF PROCEDURE:  01/03/2005  DATE OF DISCHARGE:                                 OPERATIVE REPORT   PREOPERATIVE DIAGNOSIS:  20 cm pelvic mass, likely torsed dermoid cyst.   POSTOPERATIVE DIAGNOSIS:  20 cm pelvic mass, likely torsed dermoid cyst.   PROCEDURE:  Total abdominal hysterectomy, right salpingo-oophorectomy.   SURGEON:  Malva Limes, M.D.   ASSISTANT:  Ilda Mori, M.D.   ANESTHESIA:  General endotracheal.   ANTIBIOTICS:  Ancef 1 gram.   ESTIMATED BLOOD LOSS:  200 mL.   DRAINS:  Foley to bedside drainage.   COMPLICATIONS:  None.   SPECIMENS:  Right fallopian tube and ovary sent to pathology along with  cervix and uterus.   INDICATIONS FOR PROCEDURE:  Ms. Killion is a 40 year old black female who  presented to [facility] on Jan 02, 2005, with acute onset of severe  right lower quadrant pain.  The patient required Dilaudid to control her  pain.  The patient underwent a CT scan and was found to have a 20 cm cyst in  her abdomen which was consistent with a dermoid cyst.  The patient had a  previous dermoid on her left ovary and a subsequent left salpingo-  oophorectomy.  Prior to having this procedure performed, the patient  expressed her understanding of the possibility of an infarcted ovary placing  her at risk for being put in menopause following the oophorectomy.  After a  lengthy discussion with the patient and her husband, she expressed her  desire for total abdominal hysterectomy if an oophorectomy was indicated.  The patient understood that this would prevent her from having any future  children.  She also was told of the possibility of donor egg and IVF for  future pregnancies.   DESCRIPTION OF  PROCEDURE:  The patient was taken to the operating room where  she was placed in the dorsal supine position.  A general anesthesia was  administered without complications.  She was then prepped with Betadine and  a Foley catheter was placed.  Examination revealed a large abdominal mass  with its uppermost border approximately 5 cm above the umbilicus.  Because  of the large size, a vertical skin incision was made from the pubic  symphysis to the umbilicus.  This was carried down to the fascia.  The  fascia was incised in the midline and taken superiorly and inferiorly.  The  muscles were separated.  The parietoperitoneum was entered sharply and taken  superiorly and inferiorly.  On  entering the abdominal cavity, a large  amount of ascites was noted.  The patient was also found to have a large  infarcted, deeply purple, right ovary.  The ovary appeared to be twisted on  its pedicle approximately four times.  The infundibulopelvic ligament was  dilated approximately 2 cm.  It  was felt that this ovary could not be  resuscitated.  Therefore, and O'Connor-O'Sullivan retractor was placed.  The  ovary was attempted to be lifted out and ruptured.  The bowel was then  packed away.  The round ligaments on the right was isolated, ligated with 0  Monocryl suture, and transected with a Bovie.  The broad ligament was then  opened, the ureter identified, the infundibulopelvic ligament isolated,  doubly ligated x2 with 0 Monocryl suture, and transected.  Following this,  the ovarian ligament was cauterized and the specimen handed off.  At this  point the abdominal hysterectomy was begun.  The round ligament on the left  was clamped, cut, and ligated with 0 Monocryl suture.  The ovary and  fallopian tube on the left were surgically absent.  The bladder flap was  then taken down sharply.  The uterine vessels were bilaterally clamped, cut,  and ligated with 0 Monocryl suture.  The cardinal ligaments were  serially  clamped, cut, and ligated with 0 Monocryl suture.  Once the level of the  external os was reached, the vagina was cross clamped and the specimen  removed.  The angles were then made hemostatic with 0 Monocryl suture in a  Heaney fashion.  The remaining vaginal cuff was closed using 0 Monocryl  suture in a figure-of-eight fashion.  The pelvis was then irrigated.  No  evidence of any bleeding was noted.  At this point the appendix was  inspected and appeared to be normal.  Liver was normal.  There was no  evidence of any abnormalities of the kidneys.  At this point the [facility] retractor was removed, the packing removed, the incision was  closed using 0 PDS in a running Smead-Jones fashion.  The subcuticular  tissue was made hemostatic with a Bovie and then closed using interrupted 2-  0 plain gut suture.  Stainless steel clip was used to close the skin.  The  patient was extubated and taken to the recovery room in stable condition.  Needle, sponge, and instrument counts correct x2.      MA/MEDQ  D:  01/03/2005  T:  01/04/2005  Job:  [id number]

## 2011-08-17 HISTORY — PX: CRYOTHERAPY: SHX1416

## 2011-08-19 ENCOUNTER — Encounter (HOSPITAL_COMMUNITY): Payer: Self-pay | Admitting: *Deleted

## 2011-08-19 ENCOUNTER — Emergency Department (INDEPENDENT_AMBULATORY_CARE_PROVIDER_SITE_OTHER)
Admission: EM | Admit: 2011-08-19 | Discharge: 2011-08-19 | Disposition: A | Discharge status: Home / Self Care | Payer: 59 | Source: Home / Self Care | Attending: Emergency Medicine | Admitting: Emergency Medicine

## 2011-08-19 DIAGNOSIS — J40 Bronchitis, not specified as acute or chronic: Secondary | ICD-10-CM

## 2011-08-19 DIAGNOSIS — R6889 Other general symptoms and signs: Secondary | ICD-10-CM

## 2011-08-19 MED ORDER — GUAIFENESIN-CODEINE 100-10 MG/5ML PO SYRP: 10.0000 mL | ORAL_SOLUTION | Freq: Four times a day (QID) | ORAL | Status: AC | PRN

## 2011-08-19 MED ORDER — AZITHROMYCIN 250 MG PO TABS: ORAL_TABLET | ORAL | Status: AC

## 2011-08-19 NOTE — ED Provider Notes (Signed)
Chief Complaint  Patient presents with  . Cough    History of Present Illness:  Has had a one-week history of a respiratory illness with fever and chills at onset, but none recently. Now she has a dry cough, some shortness of breath, aching in her chest and back when she coughs or takes a deep breath, and nasal congestion with clear drainage. The ears are hurting, she's had some headache and sinus pressure, sore throat, and has lost her voice. She has no definite exposures. She has not taken the flu vaccine.  Review of Systems:  Other than noted above, the patient denies any of the following symptoms. Systemic:  No fever, chills, sweats, fatigue, myalgias, headache, or anorexia. Eye:  No redness, pain or drainage. ENT:  No earache, nasal congestion, rhinorrhea, sinus pressure, or sore throat. Lungs:  No cough, sputum production, wheezing, shortness of breath. Or chest pain. GI:  No nausea, vomiting, abdominal pain or diarrhea.  PMFSH:  Past medical history, family history, social history, meds, and allergies were reviewed.  Physical Exam:   Vital signs:  BP 180/110  Pulse 82  Temp(Src) 98.6 F (37 C) (Oral)  Resp 18  SpO2 100% General:  Alert, in no distress. Eye:  No conjunctival injection or drainage. ENT:  TMs and canals were normal, without erythema or inflammation.  Nasal mucosa was clear and uncongested, without drainage.  Mucous membranes were moist.  Pharynx was clear, without exudate or drainage.  There were no oral ulcerations or lesions. Neck:  Supple, no adenopathy, tenderness or mass. Lungs:  No respiratory distress.  Lungs were clear to auscultation, without wheezes, rales or rhonchi.  Breath sounds were clear and equal bilaterally. Heart:  Regular rhythm, without gallops, murmers or rubs. Skin:  Clear, warm, and dry, without rash or lesions.  Labs:   Results for orders placed during the hospital encounter of 12/28/10  DIFFERENTIAL      Component Value Range   Neutrophils Relative 48  43 - 77 (%)   Lymphocytes Relative 46  12 - 46 (%)   Monocytes Relative 5  3 - 12 (%)   Eosinophils Relative 1  0 - 5 (%)   Basophils Relative 0  0 - 1 (%)   Neutro Abs 3.8  1.7 - 7.7 (K/uL)   Lymphs Abs 3.7  0.7 - 4.0 (K/uL)   Monocytes Absolute 0.4  0.1 - 1.0 (K/uL)   Eosinophils Absolute 0.1  0.0 - 0.7 (K/uL)   Basophils Absolute 0.0  0.0 - 0.1 (K/uL)   WBC Morphology BLASTS    CBC      Component Value Range   WBC 8.0  4.0 - 10.5 (K/uL)   RBC 4.66  3.87 - 5.11 (MIL/uL)   Hemoglobin 14.2  12.0 - 15.0 (g/dL)   HCT 16.1  09.6 - 04.5 (%)   MCV 85.6  78.0 - 100.0 (fL)   MCH 30.5  26.0 - 34.0 (pg)   MCHC 35.6  30.0 - 36.0 (g/dL)   RDW 40.9  81.1 - 91.4 (%)   Platelets 232  150 - 400 (K/uL)  HEPATIC FUNCTION PANEL      Component Value Range   Total Protein 7.7  6.0 - 8.3 (g/dL)   Albumin 4.3  3.5 - 5.2 (g/dL)   AST 18  0 - 37 (U/L)   ALT 21  0 - 35 (U/L)   Alkaline Phosphatase 96  39 - 117 (U/L)   Total Bilirubin 0.6  0.3 - 1.2 (  mg/dL)   Bilirubin, Direct 0.1  0.0 - 0.3 (mg/dL)   Indirect Bilirubin 0.5  0.3 - 0.9 (mg/dL)     Radiology:  No results found.  Medications given in UCC:  None  Assessment:   Diagnoses that have been ruled out:  Diagnoses that are still under consideration:  Final diagnoses:  Influenza-like illness  Bronchitis    Plan:   1.  The following meds were prescribed:   New Prescriptions   AZITHROMYCIN (ZITHROMAX Z-PAK) 250 MG TABLET    Take as directed.   GUAIFENESIN-CODEINE (GUIATUSS AC) 100-10 MG/5ML SYRUP    Take 10 mLs by mouth 4 (four) times daily as needed for cough.   2.  The patient was instructed in symptomatic care and handouts were given. 3.  The patient was told to return if becoming worse in any way, if no better in 3 or 4 days, and given some red flag symptoms that would indicate earlier return. 4.  her blood pressure was elevated and she had not taken her blood pressure meds today. I told her to go ahead and  go home and take a blood pressure pill today and again tomorrow. She also had been taking several cold preparations that might of had decongestants in, so I told her to the police off. Finally, toward a followup with her [facility] care physician, Dr. Lovell Sheehan, next week with regard to her blood pressure. She was given a note to remain out of work for the next 3 days.   Roque Lias, MD 08/19/11 [phone]

## 2011-08-19 NOTE — ED Notes (Signed)
Pt  Reports  Cough  Congestion  Body   Aches   As  Well            Chills  And    Fever  Earlier  Pt  Has  Had   The  Symptoms  For  Almost  One  Week  She  Has  Been taking  otc  meds       For  The  Symptoms

## 2011-08-20 ENCOUNTER — Ambulatory Visit: Payer: 59 | Admitting: Family

## 2011-08-20 DIAGNOSIS — Z0289 Encounter for other administrative examinations: Secondary | ICD-10-CM

## 2011-11-16 ENCOUNTER — Other Ambulatory Visit: Payer: 59

## 2011-11-23 ENCOUNTER — Encounter: Payer: 59 | Admitting: Internal Medicine

## 2011-12-27 ENCOUNTER — Ambulatory Visit (INDEPENDENT_AMBULATORY_CARE_PROVIDER_SITE_OTHER): Payer: 59 | Admitting: Family Medicine

## 2011-12-27 ENCOUNTER — Encounter: Payer: Self-pay | Admitting: Family Medicine

## 2011-12-27 VITALS — BP 138/80 | HR 115 | Temp 98.1°F | Wt 240.0 lb

## 2011-12-27 DIAGNOSIS — B349 Viral infection, unspecified: Secondary | ICD-10-CM

## 2011-12-27 DIAGNOSIS — B9789 Other viral agents as the cause of diseases classified elsewhere: Secondary | ICD-10-CM

## 2011-12-27 DIAGNOSIS — I10 Essential (primary) hypertension: Secondary | ICD-10-CM

## 2011-12-27 LAB — CBC WITH DIFFERENTIAL/PLATELET
Basophils Relative: 0.5 % (ref 0.0–3.0)
Eosinophils Relative: 1.6 % (ref 0.0–5.0)
HCT: 41.6 % (ref 36.0–46.0)
Hemoglobin: 14.1 g/dL (ref 12.0–15.0)
Lymphs Abs: 3.2 10*3/uL (ref 0.7–4.0)
MCV: 89.6 fl (ref 78.0–100.0)
Monocytes Absolute: 0.4 10*3/uL (ref 0.1–1.0)
Neutro Abs: 3.3 10*3/uL (ref 1.4–7.7)
Neutrophils Relative %: 47 % (ref 43.0–77.0)
RBC: 4.64 Mil/uL (ref 3.87–5.11)
WBC: 7 10*3/uL (ref 4.5–10.5)

## 2011-12-27 LAB — POCT URINALYSIS DIPSTICK
Ketones, UA: NEGATIVE
Protein, UA: NEGATIVE
Spec Grav, UA: >=1.03

## 2011-12-27 LAB — HEPATIC FUNCTION PANEL
Alkaline Phosphatase: 77 U/L (ref 39–117)
Bilirubin, Direct: 0.2 mg/dL (ref 0.0–0.3)
Total Bilirubin: 1.2 mg/dL (ref 0.3–1.2)

## 2011-12-27 LAB — BASIC METABOLIC PANEL
Calcium: 9.4 mg/dL (ref 8.4–10.5)
Creatinine, Ser: 0.7 mg/dL (ref 0.4–1.2)
GFR: 116.36 mL/min (ref >60.00)
Sodium: 140 mEq/L (ref 135–145)

## 2011-12-27 NOTE — Progress Notes (Signed)
  Subjective:    Patient ID: [name], female    DOB: [birth year], 41 y.o.   MRN: [id number]  HPI    Review of Systems     Objective:   Physical Exam        Assessment & Plan:

## 2011-12-28 NOTE — Progress Notes (Addendum)
  Subjective:    Patient ID: Minette Brine, female    DOB: [birth year], 41 y.o.   MRN: [id number]  HPI Here for several days of feeling fatigued with mild body aches and a slight dry cough. No HA or ST. No fever. No NVD.    Review of Systems  Constitutional: Positive for fatigue.  HENT: Negative.   Eyes: Negative.   Respiratory: Positive for cough.   Cardiovascular: Negative.   Gastrointestinal: Negative.   Genitourinary: Negative.   Musculoskeletal: Positive for myalgias.       Objective:   Physical Exam  Constitutional: She appears well-developed and well-nourished.  HENT:  Right Ear: External ear normal.  Left Ear: External ear normal.  Nose: Nose normal.  Mouth/Throat: Oropharynx is clear and moist.  Eyes: Conjunctivae are normal.  Neck: No thyromegaly present.  Cardiovascular: Normal rate, regular rhythm, normal heart sounds and intact distal pulses.   Pulmonary/Chest: Effort normal and breath sounds normal.  Lymphadenopathy:    She has no cervical adenopathy.          Assessment & Plan:  Probably viral illness. Rest, drink fluids. Use tylenol prn. Get labs today

## 2011-12-31 MED ORDER — POTASSIUM CHLORIDE CRYS ER 10 MEQ PO TBCR: 10.0000 meq | EXTENDED_RELEASE_TABLET | Freq: Every day | ORAL | Status: DC

## 2011-12-31 NOTE — Progress Notes (Signed)
Quick Note:  I spoke with pt ______ 

## 2011-12-31 NOTE — Progress Notes (Signed)
Addended by: Court Joy on: 12/31/2011 10:46 AM   Modules accepted: Orders

## 2012-02-04 ENCOUNTER — Other Ambulatory Visit: Payer: 59

## 2012-02-11 ENCOUNTER — Encounter: Payer: 59 | Admitting: Internal Medicine

## 2012-05-12 ENCOUNTER — Emergency Department (INDEPENDENT_AMBULATORY_CARE_PROVIDER_SITE_OTHER)
Admission: EM | Admit: 2012-05-12 | Discharge: 2012-05-12 | Disposition: A | Discharge status: Home / Self Care | Payer: 59 | Source: Home / Self Care | Attending: Family Medicine | Admitting: Family Medicine

## 2012-05-12 ENCOUNTER — Emergency Department (INDEPENDENT_AMBULATORY_CARE_PROVIDER_SITE_OTHER): Payer: 59

## 2012-05-12 ENCOUNTER — Encounter (HOSPITAL_COMMUNITY): Payer: Self-pay | Admitting: *Deleted

## 2012-05-12 DIAGNOSIS — IMO0002 Reserved for concepts with insufficient information to code with codable children: Secondary | ICD-10-CM

## 2012-05-12 IMAGING — CR DG FOOT COMPLETE 3+V*R*
3 series · 3 of 3 positions shown · non-contrast
Comparison: None.

CLINICAL DATA: Question foreign body and heel.

RIGHT FOOT COMPLETE - 3+ VIEW

[view not recorded (1 of 3)]
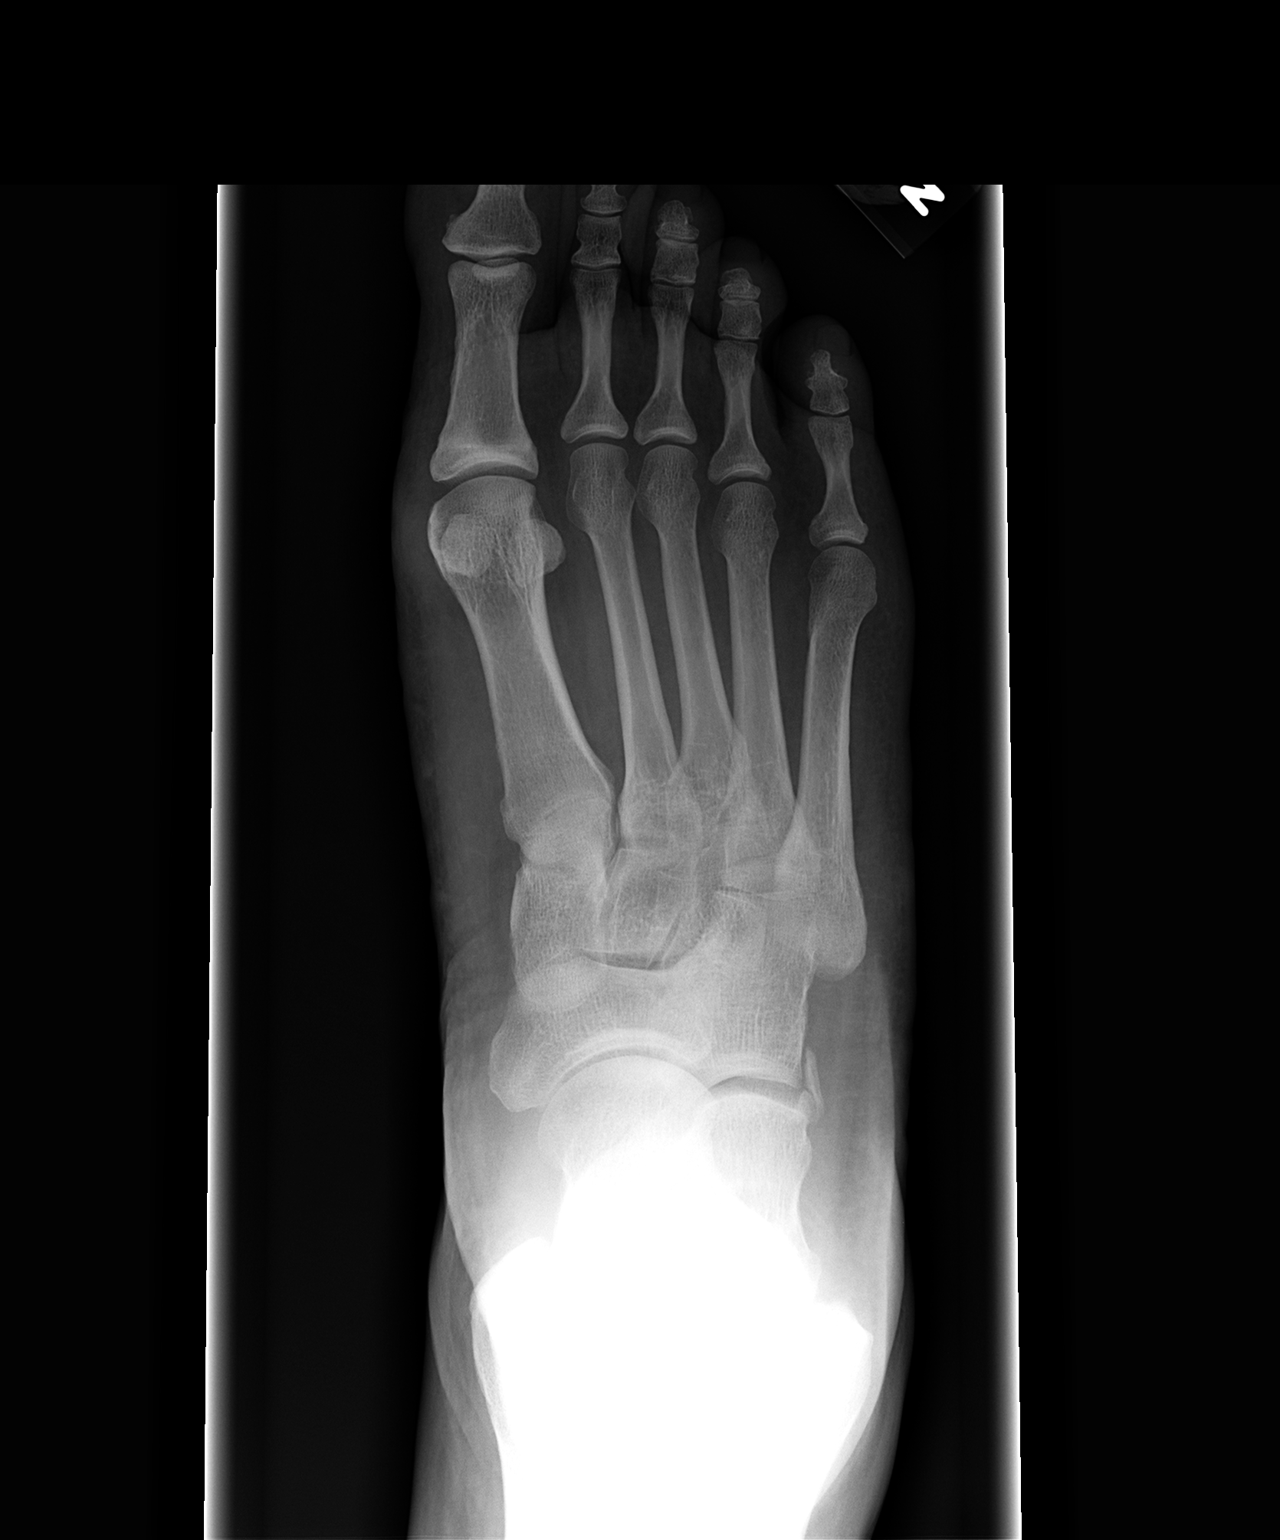

[view not recorded (2 of 3)]
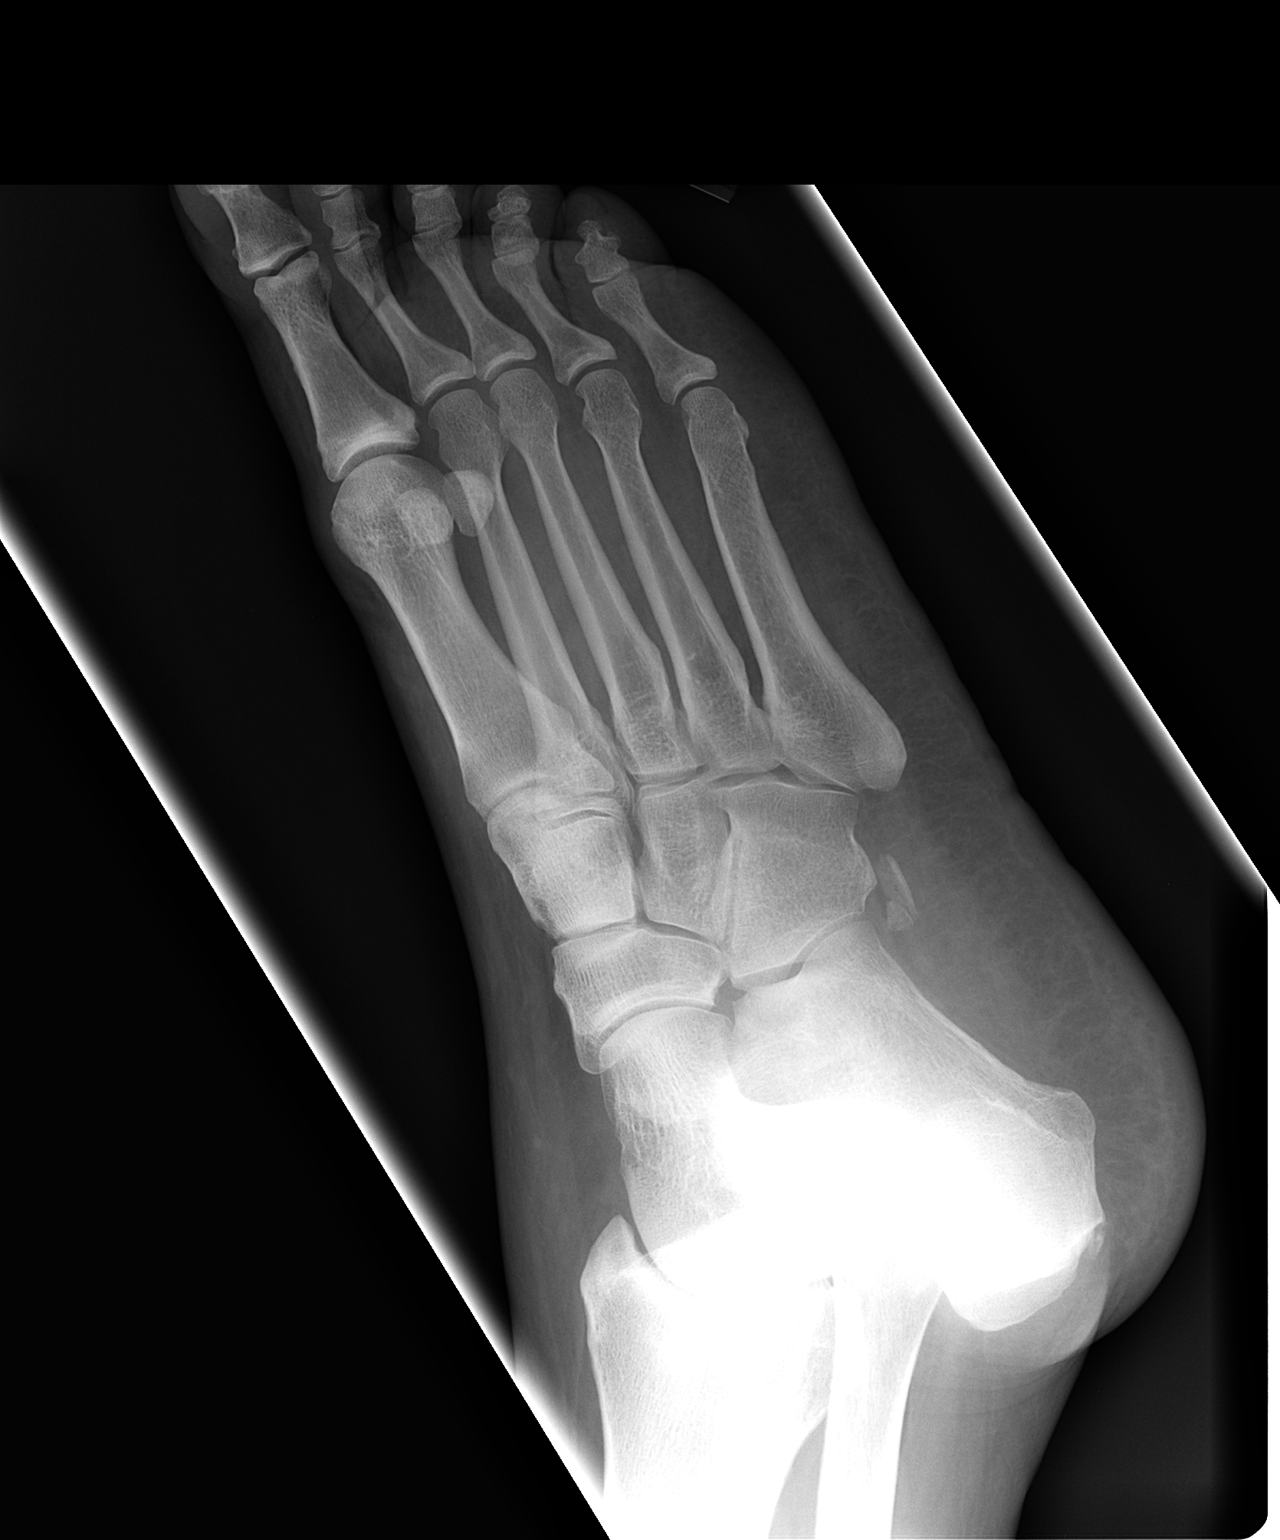

[view not recorded (3 of 3)]
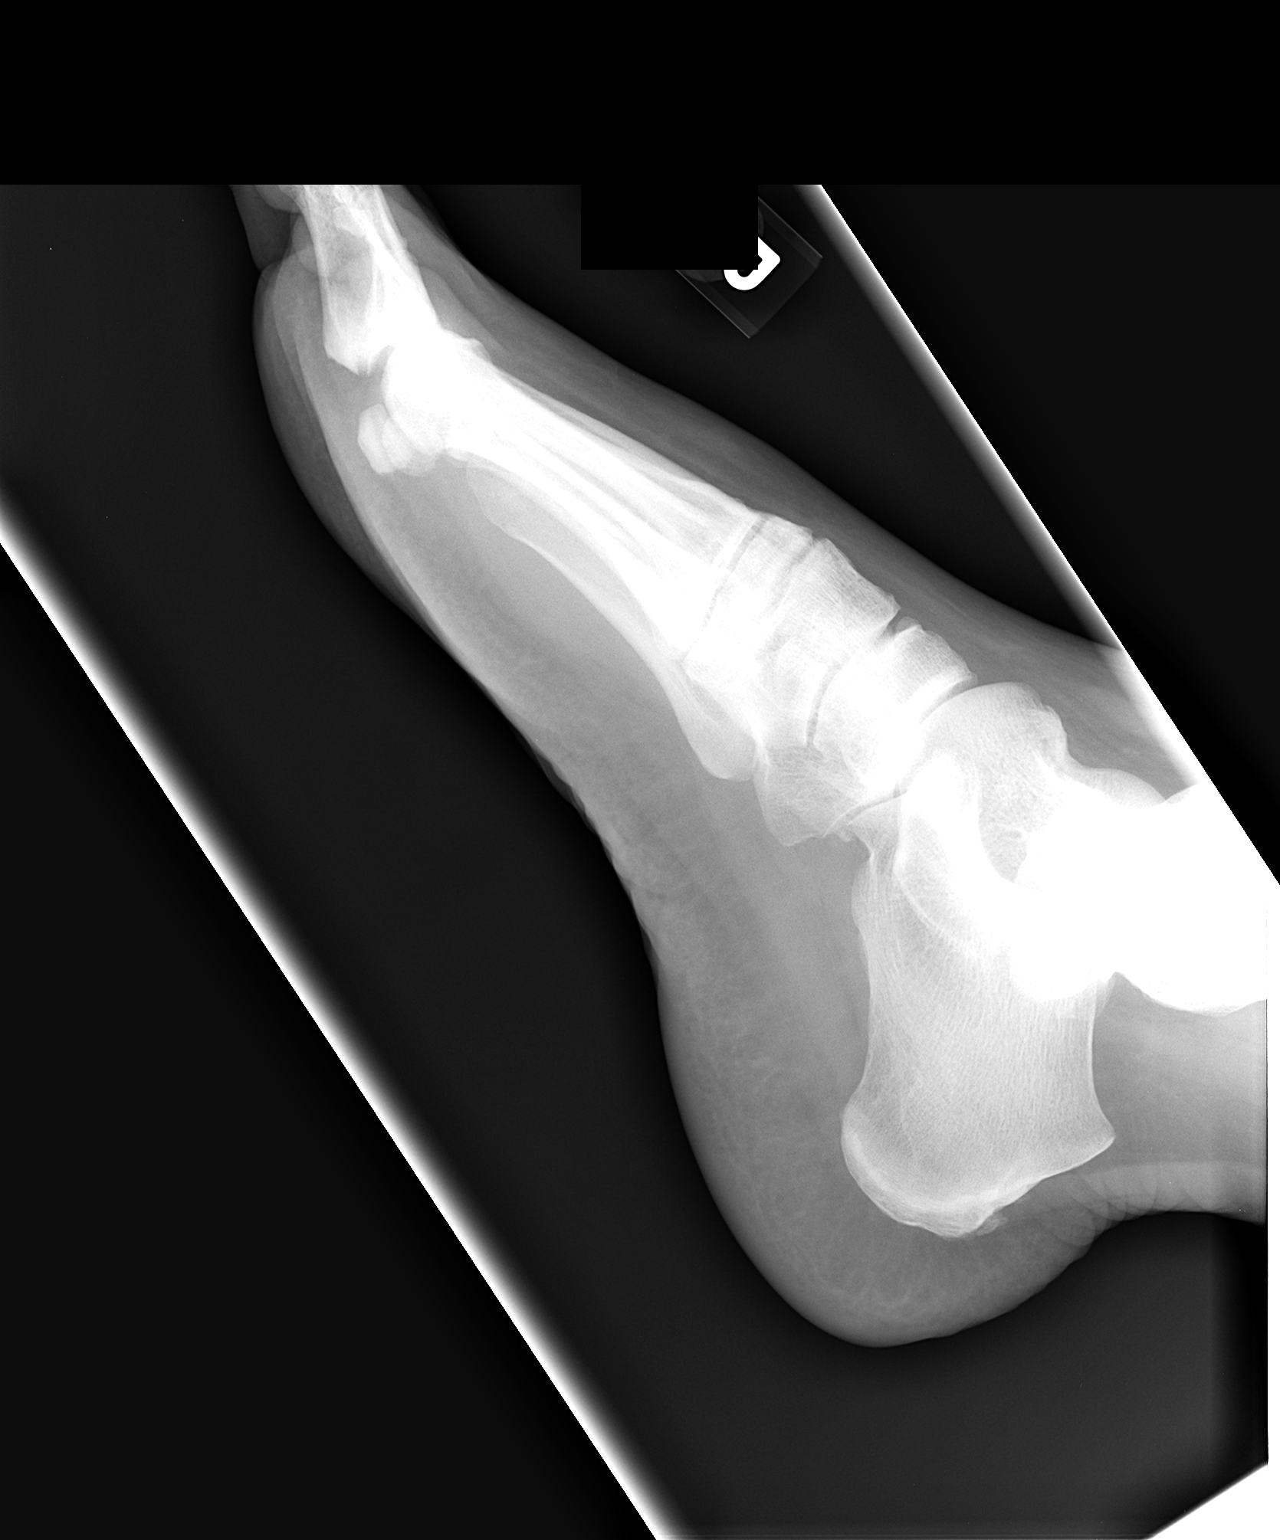

[3 of 3 positions shown; findings below may reference images not displayed]

FINDINGS: No radiopaque foreign bodies. No acute bony abnormality.
Specifically, no fracture, subluxation, or dislocation.  Soft
tissues are intact.
IMPRESSION: No acute bony abnormality.  No radiopaque foreign bodies.

## 2012-05-12 NOTE — ED Notes (Signed)
Pt reports foreign object in bottom of right foot - pt states that she has no idea what could be in foot

## 2012-05-12 NOTE — ED Provider Notes (Signed)
History     CSN: [id number]  Arrival date & time 05/12/12  1000   First MD Initiated Contact with Patient 05/12/12 1005      Chief Complaint  Patient presents with  . Foreign Body    (Consider location/radiation/quality/duration/timing/severity/associated sxs/prior treatment) Patient is a 41 y.o. female presenting with foreign body. The history is provided by the patient.  Foreign Body  The current episode started yesterday. Intake: walking barefoot in house and stepped on unknown object in right foot, c/o pain.    Past Medical History  Diagnosis Date  . Hypertension     Past Surgical History  Procedure Date  . Abdominal hysterectomy     Family History  Problem Relation Age of Onset  . Hypertension Other     History  Substance Use Topics  . Smoking status: Never Smoker   . Smokeless tobacco: Never Used  . Alcohol Use: Yes     once every other month    OB History    Grav Para Term Preterm Abortions TAB SAB Ect Mult Living                  Review of Systems  Constitutional: Negative.   Musculoskeletal: Positive for gait problem.  Skin: Positive for wound.    Allergies  Tylox  Home Medications   Current Outpatient Rx  Name Route Sig Dispense Refill  . OLMESARTAN MEDOXOMIL-HCTZ 20-12.5 MG PO TABS Oral Take 1 tablet by mouth daily.      Marland Kitchen POTASSIUM CHLORIDE CRYS ER 10 MEQ PO TBCR Oral Take 1 tablet (10 mEq total) by mouth daily. 30 tablet 11    BP 136/84  Pulse 88  Temp 98.2 F (36.8 C) (Oral)  Resp 18  SpO2 98%  Physical Exam  Nursing note and vitals reviewed. Constitutional: She is oriented to person, place, and time. She appears well-developed and well-nourished.  Neurological: She is alert and oriented to person, place, and time.  Skin: Skin is warm and dry.       Tenderness to palpation to heel area with presumed healed puncture site evident, no bleeding, erythema or drainage.    ED Course  Procedures (including critical care  time)  Labs Reviewed - No data to display Dg Foot Complete Right  05/12/2012  *RADIOLOGY REPORT*  Clinical Data: Question foreign body and heel.  RIGHT FOOT COMPLETE - 3+ VIEW  Comparison: None.  Findings: No radiopaque foreign bodies. No acute bony abnormality. Specifically, no fracture, subluxation, or dislocation.  Soft tissues are intact.  IMPRESSION: No acute bony abnormality.  No radiopaque foreign bodies.   Original Report Authenticated By: Cyndie Chime, M.D.      1. Foreign body in foot or toe       MDM  X-rays reviewed and report per radiologist. Discussed with dr Charlann Boxer, has referred to assoc with office eval today.Linna Hoff, MD 05/12/12 [phone]

## 2012-05-24 ENCOUNTER — Encounter: Payer: Self-pay | Admitting: Family

## 2012-05-24 ENCOUNTER — Ambulatory Visit (INDEPENDENT_AMBULATORY_CARE_PROVIDER_SITE_OTHER): Payer: 59 | Admitting: Family

## 2012-05-24 VITALS — BP 100/60 | Temp 98.6°F | Wt 245.0 lb

## 2012-05-24 DIAGNOSIS — L910 Hypertrophic scar: Secondary | ICD-10-CM

## 2012-05-24 NOTE — Progress Notes (Signed)
Subjective:    Patient ID: [name], female    DOB: 17-Sep-1970, 41 y.o.   MRN: [id number]  HPI 41 year old [place] American female, nonsmoker, patient of Dr. Lovell Sheehan is in today for recheck of a keloid for which she had cryotherapy performed in [place] on 05/04/2012. Patient reports a keloid originally being approximately 1 inch wide by 6-1/2 inches long. She researched a physician who specializes in this area and has been corresponding via e-mail with the progress of the therapy. Patient has been doing well until she began to have yellow pustular discharge and odor from the lesion. She was started on Cipro 500 mg twice daily by Dr. Danella Maiers. Since then, she has noticed the lesion improving. However, Dr. Danella Maiers suggested she has a PCP take a look. She denies any fever, myalgias, muscle aches or pain. She continues to have tenderness to the site that is improved.    Review of Systems  Constitutional: Negative.  Negative for fever, chills and fatigue.  HENT: Negative.   Eyes: Negative.   Respiratory: Negative.   Cardiovascular: Negative.   Musculoskeletal: Negative.   Skin:       Large keloid to the left neck. Pus drainage.   Neurological: Negative.   Psychiatric/Behavioral: Negative.    Past Medical History  Diagnosis Date  . Hypertension     History   Social History  . Marital Status: Married    Spouse Name: N/A    Number of Children: N/A  . Years of Education: N/A   Occupational History  . Not on file.   Social History Main Topics  . Smoking status: Never Smoker   . Smokeless tobacco: Never Used  . Alcohol Use: Yes     once every other month  . Drug Use: No  . Sexually Active: Yes   Other Topics Concern  . Not on file   Social History Narrative  . No narrative on file    Past Surgical History  Procedure Date  . Abdominal hysterectomy     Family History  Problem Relation Age of Onset  . Hypertension Other     Allergies  Allergen  Reactions  . Tylox (Oxycodone-Acetaminophen)     Caused face to swell    Current Outpatient Prescriptions on File Prior to Visit  Medication Sig Dispense Refill  . olmesartan-hydrochlorothiazide (BENICAR HCT) 20-12.5 MG per tablet Take 1 tablet by mouth daily.        . potassium chloride (K-DUR,KLOR-CON) 10 MEQ tablet Take 1 tablet (10 mEq total) by mouth daily.  30 tablet  11    BP 100/60  Temp 98.6 F (37 C) (Oral)  Wt 245 lb (111.131 kg)chart    Objective:   Physical Exam  Constitutional: She is oriented to person, place, and time. She appears well-developed and well-nourished.  Neck: Neck supple. No thyromegaly present.  Cardiovascular: Normal rate, regular rhythm and normal heart sounds.   Pulmonary/Chest: Effort normal and breath sounds normal.  Neurological: She is alert and oriented to person, place, and time.  Skin: Skin is warm and dry.       Large Keloid to the left neck. Tissue necrosis noted to the distal aspect. Minimal redness. Minimum drainage from the lesion that is purulent in nature.Mildly tender to touch.   Psychiatric: She has a normal mood and affect.          Assessment & Plan:  Assessment: Keloid with secondary infection-improving.   Plan: Reviewed email correspondence between M.D. and patient.  I will send Dr. Dorothey Baseman an e-mail letting him know that her Keloid appears to be healing well and infection appears to be improving. Lesion appears to be in line with the expected healing process of cryotherapy.  I believe she's currently stable on Cipro twice a day. Therefore, I will not do anything differently. Patient advised to call the office if she develops any redness, increase in drainage, older, fever, muscle aches or pain.

## 2013-06-08 ENCOUNTER — Encounter: Payer: Self-pay | Admitting: Internal Medicine

## 2013-06-08 ENCOUNTER — Ambulatory Visit (INDEPENDENT_AMBULATORY_CARE_PROVIDER_SITE_OTHER): Payer: BC Managed Care – PPO | Admitting: Internal Medicine

## 2013-06-08 VITALS — BP 180/116 | HR 75 | Temp 97.8°F | Wt 243.0 lb

## 2013-06-08 DIAGNOSIS — L910 Hypertrophic scar: Secondary | ICD-10-CM

## 2013-06-08 DIAGNOSIS — I10 Essential (primary) hypertension: Secondary | ICD-10-CM

## 2013-06-08 MED ORDER — OLMESARTAN-AMLODIPINE-HCTZ 40-5-25 MG PO TABS: 1.0000 | ORAL_TABLET | ORAL | Status: DC

## 2013-06-08 NOTE — Progress Notes (Signed)
Chief Complaint  Patient presents with  . Hypertension    Ran out of her bp meds and out of samples.    HPI: Patient comes in today for SDA for  new problem evaluation. PCP NA  Last ov over a year ago.  With dr Clent Ridges who added potassium to her meds . She apparently has long-standing hypertension and has had medicines in the past or that helpful. Dr. Lovell Sheehan and given her Benicar 20/12.5 HCTZ without a formal prescription and she had been taking at this year however for the last few weeks he ran out of samples. She also ran out of her potassium. Her blood pressure hasn't really been monitored nor she had a return visit. However she was at a health screening at work and they noted that her blood pressure was very elevated 160/100. Her total cholesterol was 224 9 HDL 186 and HDL 38. Blood sugar was normal at 83 . She was told to see her primary physician and had to sign a paper that said she would.  2 weeks ago and had buring in [place] and then broke out and was put cortisone cream on it and was better  He is under a lot of stress because of very severe keloids and has traveled to a number of places last new work for treatments. She does drive an hour and half to work at times [place].  ROS: See pertinent positives and negatives per HPI. No cp sob syncope neuro events  No hx of mi hf etc  Non smoker  Past Medical History  Diagnosis Date  . Hypertension     Family History  Problem Relation Age of Onset  . Hypertension Other     History   Social History  . Marital Status: Married    Spouse Name: N/A    Number of Children: N/A  . Years of Education: N/A   Social History Main Topics  . Smoking status: Never Smoker   . Smokeless tobacco: Never Used  . Alcohol Use: Yes     Comment: once every other month  . Drug Use: No  . Sexual Activity: Yes   Other Topics Concern  . None   Social History Narrative  . None    Outpatient Encounter Prescriptions as of 06/08/2013  Medication  Sig Dispense Refill  . Olmesartan-Amlodipine-HCTZ (TRIBENZOR) 40-5-25 MG TABS Take 1 tablet by mouth 1 day or 1 dose.  28 tablet  0  . [DISCONTINUED] ciprofloxacin (CIPRO) 500 MG tablet Take 500 mg by mouth 2 (two) times daily.      . [DISCONTINUED] olmesartan-hydrochlorothiazide (BENICAR HCT) 20-12.5 MG per tablet Take 1 tablet by mouth daily.        . [DISCONTINUED] potassium chloride (K-DUR,KLOR-CON) 10 MEQ tablet Take 1 tablet (10 mEq total) by mouth daily.  30 tablet  11   No facility-administered encounter medications on file as of 06/08/2013.    EXAM:  BP 180/116  Pulse 75  Temp(Src) 97.8 F (36.6 C) (Oral)  Wt 243 lb (110.224 kg)  BMI 32.07 kg/m2  SpO2 98%  Body mass index is 32.07 kg/(m^2).  GENERAL: vitals reviewed and listed above, alert, oriented, appears well hydrated and in no acute distress HEENT: atraumatic, conjunctiva  clear, no obvious abnormalities on inspection of external nose and ears NECK: no obvious masses on inspection palpation  Except very large keloids  Left neck  LUNGS: clear to auscultation bilaterally, no wheezes, rales or rhonchi, good air movement CV: HRRR, no clubbing  cyanosis or  peripheral edema nl cap refill  MS: moves all extremities without noticeable focal  abnormality PSYCH: pleasant and cooperative, no obvious depression or anxiety  ASSESSMENT AND PLAN:  Discussed the following assessment and plan:  Severe hypertension - out of meds uncertain what controls  no easy trail in the EHR. given samples of triple therapy and fu wiht labs   Keloid scar - sever problematic extensive  -Patient advised to return or notify health care team  if symptoms worsen or persist or new concerns arise.  Patient Instructions  You blood pressure is very high  Get a BP monitor  And check readings at least 5 x per week.  ROV in  About 3 weeks or call about readings and progress  You will need to have labs  Done for chemistry and  Potassium at that time .     Managing Your High Blood Pressure Blood pressure is a measurement of how forceful your blood is pressing against the walls of the arteries. Arteries are muscular tubes within the circulatory system. Blood pressure does not stay the same. Blood pressure rises when you are active, excited, or nervous; and it lowers during sleep and relaxation. If the numbers measuring your blood pressure stay above normal most of the time, you are at risk for health problems. High blood pressure (hypertension) is a long-term (chronic) condition in which blood pressure is elevated. A blood pressure reading is recorded as two numbers, such as 120 over 80 (or 120/80). The first, higher number is called the systolic pressure. It is a measure of the pressure in your arteries as the heart beats. The second, lower number is called the diastolic pressure. It is a measure of the pressure in your arteries as the heart relaxes between beats.  Keeping your blood pressure in a normal range is important to your overall health and prevention of health problems, such as heart disease and stroke. When your blood pressure is uncontrolled, your heart has to work harder than normal. High blood pressure is a very common condition in adults because blood pressure tends to rise with age. Men and women are equally likely to have hypertension but at different times in life. Before age 34, men are more likely to have hypertension. After 43 years of age, women are more likely to have it. Hypertension is especially common in African Americans. This condition often has no signs or symptoms. The cause of the condition is usually not known. Your caregiver can help you come up with a plan to keep your blood pressure in a normal, healthy range. BLOOD PRESSURE STAGES Blood pressure is classified into four stages: normal, prehypertension, stage 1, and stage 2. Your blood pressure reading will be used to determine what type of treatment, if any, is necessary.  Appropriate treatment options are tied to these four stages:  Normal  Systolic pressure (mm Hg): below 120.  Diastolic pressure (mm Hg): below 80. Prehypertension  Systolic pressure (mm Hg): 120 to 139.  Diastolic pressure (mm Hg): 80 to 89. Stage1  Systolic pressure (mm Hg): 140 to 159.  Diastolic pressure (mm Hg): 90 to 99. Stage2  Systolic pressure (mm Hg): 160 or above.  Diastolic pressure (mm Hg): 100 or above. RISKS RELATED TO HIGH BLOOD PRESSURE Managing your blood pressure is an important responsibility. Uncontrolled high blood pressure can lead to:  A heart attack.  A stroke.  A weakened blood vessel (aneurysm).  Heart failure.  Kidney damage.  Eye damage.  Metabolic syndrome.  Memory and concentration problems. HOW TO MANAGE YOUR BLOOD PRESSURE Blood pressure can be managed effectively with lifestyle changes and medicines (if needed). Your caregiver will help you come up with a plan to bring your blood pressure within a normal range. Your plan should include the following: Education  Read all information provided by your caregivers about how to control blood pressure.  Educate yourself on the latest guidelines and treatment recommendations. New research is always being done to further define the risks and treatments for high blood pressure. Lifestylechanges  Control your weight.  Avoid smoking.  Stay physically active.  Reduce the amount of salt in your diet.  Reduce stress.  Control any chronic conditions, such as high cholesterol or diabetes.  Reduce your alcohol intake. Medicines  Several medicines (antihypertensive medicines) are available, if needed, to bring blood pressure within a normal range. Communication  Review all the medicines you take with your caregiver because there may be side effects or interactions.  Talk with your caregiver about your diet, exercise habits, and other lifestyle factors that may be contributing to  high blood pressure.  See your caregiver regularly. Your caregiver can help you create and adjust your plan for managing high blood pressure. RECOMMENDATIONS FOR TREATMENT AND FOLLOW-UP  The following recommendations are based on current guidelines for managing high blood pressure in nonpregnant adults. Use these recommendations to identify the proper follow-up period or treatment option based on your blood pressure reading. You can discuss these options with your caregiver.  Systolic pressure of 120 to 139 or diastolic pressure of 80 to 89: Follow up with your caregiver as directed.  Systolic pressure of 140 to 160 or diastolic pressure of 90 to 100: Follow up with your caregiver within 2 months.  Systolic pressure above 160 or diastolic pressure above 100: Follow up with your caregiver within 1 month.  Systolic pressure above 180 or diastolic pressure above 110: Consider antihypertensive therapy; follow up with your caregiver within 1 week.  Systolic pressure above 200 or diastolic pressure above 120: Begin antihypertensive therapy; follow up with your caregiver within 1 week. Document Released: 04/26/2012 Document Reviewed: 04/26/2012 [facility] Patient Information 2014 [place], [place].      Neta Mends. Atilla Zollner M.D.

## 2013-06-08 NOTE — Patient Instructions (Signed)
You blood pressure is very high  Get a BP monitor  And check readings at least 5 x per week.  ROV in  About 3 weeks or call about readings and progress  You will need to have labs  Done for chemistry and  Potassium at that time .    Managing Your High Blood Pressure Blood pressure is a measurement of how forceful your blood is pressing against the walls of the arteries. Arteries are muscular tubes within the circulatory system. Blood pressure does not stay the same. Blood pressure rises when you are active, excited, or nervous; and it lowers during sleep and relaxation. If the numbers measuring your blood pressure stay above normal most of the time, you are at risk for health problems. High blood pressure (hypertension) is a long-term (chronic) condition in which blood pressure is elevated. A blood pressure reading is recorded as two numbers, such as 120 over 80 (or 120/80). The first, higher number is called the systolic pressure. It is a measure of the pressure in your arteries as the heart beats. The second, lower number is called the diastolic pressure. It is a measure of the pressure in your arteries as the heart relaxes between beats.  Keeping your blood pressure in a normal range is important to your overall health and prevention of health problems, such as heart disease and stroke. When your blood pressure is uncontrolled, your heart has to work harder than normal. High blood pressure is a very common condition in adults because blood pressure tends to rise with age. Men and women are equally likely to have hypertension but at different times in life. Before age 49, men are more likely to have hypertension. After 43 years of age, women are more likely to have it. Hypertension is especially common in African Americans. This condition often has no signs or symptoms. The cause of the condition is usually not known. Your caregiver can help you come up with a plan to keep your blood pressure in a  normal, healthy range. BLOOD PRESSURE STAGES Blood pressure is classified into four stages: normal, prehypertension, stage 1, and stage 2. Your blood pressure reading will be used to determine what type of treatment, if any, is necessary. Appropriate treatment options are tied to these four stages:  Normal  Systolic pressure (mm Hg): below 120.  Diastolic pressure (mm Hg): below 80. Prehypertension  Systolic pressure (mm Hg): 120 to 139.  Diastolic pressure (mm Hg): 80 to 89. Stage1  Systolic pressure (mm Hg): 140 to 159.  Diastolic pressure (mm Hg): 90 to 99. Stage2  Systolic pressure (mm Hg): 160 or above.  Diastolic pressure (mm Hg): 100 or above. RISKS RELATED TO HIGH BLOOD PRESSURE Managing your blood pressure is an important responsibility. Uncontrolled high blood pressure can lead to:  A heart attack.  A stroke.  A weakened blood vessel (aneurysm).  Heart failure.  Kidney damage.  Eye damage.  Metabolic syndrome.  Memory and concentration problems. HOW TO MANAGE YOUR BLOOD PRESSURE Blood pressure can be managed effectively with lifestyle changes and medicines (if needed). Your caregiver will help you come up with a plan to bring your blood pressure within a normal range. Your plan should include the following: Education  Read all information provided by your caregivers about how to control blood pressure.  Educate yourself on the latest guidelines and treatment recommendations. New research is always being done to further define the risks and treatments for high blood pressure. Lifestylechanges  Control  your weight.  Avoid smoking.  Stay physically active.  Reduce the amount of salt in your diet.  Reduce stress.  Control any chronic conditions, such as high cholesterol or diabetes.  Reduce your alcohol intake. Medicines  Several medicines (antihypertensive medicines) are available, if needed, to bring blood pressure within a normal  range. Communication  Review all the medicines you take with your caregiver because there may be side effects or interactions.  Talk with your caregiver about your diet, exercise habits, and other lifestyle factors that may be contributing to high blood pressure.  See your caregiver regularly. Your caregiver can help you create and adjust your plan for managing high blood pressure. RECOMMENDATIONS FOR TREATMENT AND FOLLOW-UP  The following recommendations are based on current guidelines for managing high blood pressure in nonpregnant adults. Use these recommendations to identify the proper follow-up period or treatment option based on your blood pressure reading. You can discuss these options with your caregiver.  Systolic pressure of 120 to 139 or diastolic pressure of 80 to 89: Follow up with your caregiver as directed.  Systolic pressure of 140 to 160 or diastolic pressure of 90 to 100: Follow up with your caregiver within 2 months.  Systolic pressure above 160 or diastolic pressure above 100: Follow up with your caregiver within 1 month.  Systolic pressure above 180 or diastolic pressure above 110: Consider antihypertensive therapy; follow up with your caregiver within 1 week.  Systolic pressure above 200 or diastolic pressure above 120: Begin antihypertensive therapy; follow up with your caregiver within 1 week. Document Released: 04/26/2012 Document Reviewed: 04/26/2012 [facility] Patient Information 2014 [place], [place].

## 2013-07-02 ENCOUNTER — Ambulatory Visit: Payer: BC Managed Care – PPO | Admitting: Internal Medicine

## 2013-07-06 ENCOUNTER — Encounter: Payer: Self-pay | Admitting: Internal Medicine

## 2013-07-06 ENCOUNTER — Ambulatory Visit (INDEPENDENT_AMBULATORY_CARE_PROVIDER_SITE_OTHER): Payer: BC Managed Care – PPO | Admitting: Internal Medicine

## 2013-07-06 VITALS — BP 106/78 | HR 93 | Temp 98.2°F | Wt 239.0 lb

## 2013-07-06 DIAGNOSIS — Z299 Encounter for prophylactic measures, unspecified: Secondary | ICD-10-CM

## 2013-07-06 DIAGNOSIS — E785 Hyperlipidemia, unspecified: Secondary | ICD-10-CM

## 2013-07-06 DIAGNOSIS — I10 Essential (primary) hypertension: Secondary | ICD-10-CM

## 2013-07-06 LAB — LIPID PANEL
Cholesterol: 226 mg/dL — ABNORMAL HIGH (ref 0–200)
HDL: 38.7 mg/dL — ABNORMAL LOW (ref >39.00)
Total CHOL/HDL Ratio: 6
Triglycerides: 130 mg/dL (ref 0.0–149.0)
VLDL: 26 mg/dL (ref 0.0–40.0)

## 2013-07-06 LAB — CBC WITH DIFFERENTIAL/PLATELET
Basophils Absolute: 0 10*3/uL (ref 0.0–0.1)
Eosinophils Absolute: 0.1 10*3/uL (ref 0.0–0.7)
HCT: 40.6 % (ref 36.0–46.0)
Hemoglobin: 13.9 g/dL (ref 12.0–15.0)
Lymphs Abs: 4.3 10*3/uL — ABNORMAL HIGH (ref 0.7–4.0)
MCHC: 34.2 g/dL (ref 30.0–36.0)
Neutro Abs: 4.4 10*3/uL (ref 1.4–7.7)
RDW: 13.2 % (ref 11.5–14.6)

## 2013-07-06 LAB — BASIC METABOLIC PANEL
CO2: 31 mEq/L (ref 19–32)
Calcium: 10.2 mg/dL (ref 8.4–10.5)
Glucose, Bld: 72 mg/dL (ref 70–99)
Potassium: 3.7 mEq/L (ref 3.5–5.1)
Sodium: 139 mEq/L (ref 135–145)

## 2013-07-06 LAB — LDL CHOLESTEROL, DIRECT: Direct LDL: 177.3 mg/dL

## 2013-07-06 LAB — TSH: TSH: 0.97 u[IU]/mL (ref 0.35–5.50)

## 2013-07-06 LAB — HEPATIC FUNCTION PANEL: Albumin: 4.6 g/dL (ref 3.5–5.2)

## 2013-07-06 MED ORDER — OLMESARTAN-AMLODIPINE-HCTZ 40-5-25 MG PO TABS: 1.0000 | ORAL_TABLET | ORAL | Status: DC

## 2013-07-06 NOTE — Patient Instructions (Signed)
continue medication  Take at night to see if less se .  Will notify you  of labs when available. Check bp readings   periodically  If too low 100 and below and or feel too dizzy  .  Then contact us for dose reduction. Make appt cpx with dr Lovell Sheehan   In the next 2 months or so.

## 2013-07-06 NOTE — Progress Notes (Signed)
Chief Complaint  Patient presents with  . Follow-up    Pt has had feelings of fatigue and has also had headaches.    HPI: Began medicine see last visit. When first began felt very tired after taking it that is getting better however does get that feeling soon after sometimes. One evening she had a headache when she woke up. No dizziness lightheadedness syncope. He did go to [place] to have her keloids cryotherapy. No chest pain shortness of breath unusual cramping. ROS: See pertinent positives and negatives per HPI. No cough see above  Past Medical History  Diagnosis Date  . Hypertension     Family History  Problem Relation Age of Onset  . Hypertension Other     History   Social History  . Marital Status: Married    Spouse Name: N/A    Number of Children: N/A  . Years of Education: N/A   Social History Main Topics  . Smoking status: Never Smoker   . Smokeless tobacco: Never Used  . Alcohol Use: Yes     Comment: once every other month  . Drug Use: No  . Sexual Activity: Yes   Other Topics Concern  . None   Social History Narrative  . None    Outpatient Encounter Prescriptions as of 07/06/2013  Medication Sig  . Olmesartan-Amlodipine-HCTZ (TRIBENZOR) 40-5-25 MG TABS Take 1 tablet by mouth 1 day or 1 dose.  . [DISCONTINUED] Olmesartan-Amlodipine-HCTZ (TRIBENZOR) 40-5-25 MG TABS Take 1 tablet by mouth 1 day or 1 dose.    EXAM:  BP 106/78  Pulse 93  Temp(Src) 98.2 F (36.8 C) (Oral)  Wt 239 lb (108.41 kg)  SpO2 98%  Body mass index is 31.54 kg/(m^2).  GENERAL: vitals reviewed and listed above, alert, oriented, appears well hydrated and in no acute distress Repeat blood pressure 106 initial blood pressure 116 no tachycardia heart rate 82 CV: HRRR, no clubbing cyanosis or  peripheral edema nl cap refill  MS: moves all extremities without noticeable focal  abnormality PSYCH: pleasant and cooperative, no obvious depression or anxiety  ASSESSMENT AND  PLAN:  Discussed the following assessment and plan:  HYPERTENSION - Plan: Basic metabolic panel, CBC with Differential, Hepatic function panel, Lipid panel, TSH  HYPERLIPIDEMIA, MILD, WITH LOW HDL - Plan: Basic metabolic panel, CBC with Differential, Hepatic function panel, Lipid panel, TSH  Preventive measure - Plan: Basic metabolic panel, CBC with Differential, Hepatic function panel, Lipid panel, TSH Suspect the fatigue could be related to medication and we discussed the possibility of decreasing that dose if her blood pressure is low or she doesn't attenuate. Have her check her blood pressure readings and if low contact us and we will decrease dose. Samples prescription and coupon given today. She is due for lab tests will do CPX labs and let her note results have her schedule a wellness visit with Dr. Lovell Sheehan. It is okay that she takes her blood pressure medication at night. -Patient advised to return or notify health care team  if symptoms worsen or persist or new concerns arise.  Patient Instructions  continue medication  Take at night to see if less se .  Will notify you  of labs when available. Check bp readings   periodically  If too low 100 and below and or feel too dizzy  .  Then contact us for dose reduction. Make appt cpx with dr Lovell Sheehan   In the next 2 months or so.   Neta Mends. Myka Hitz M.D.  Total visit > 50% spent counseling and coordinating care

## 2013-07-11 ENCOUNTER — Encounter: Payer: Self-pay | Admitting: Family Medicine

## 2013-07-20 ENCOUNTER — Other Ambulatory Visit: Payer: BC Managed Care – PPO

## 2013-07-20 ENCOUNTER — Telehealth: Payer: Self-pay | Admitting: Internal Medicine

## 2013-07-20 NOTE — Telephone Encounter (Signed)
Pt does not need to more labs

## 2013-07-20 NOTE — Telephone Encounter (Signed)
Pt states she had labs done 11/21 and has another appt today.  cancelled appt. pls call if she needs to do any other labs.

## 2013-07-27 ENCOUNTER — Encounter: Payer: BC Managed Care – PPO | Admitting: Family

## 2013-08-21 ENCOUNTER — Ambulatory Visit (INDEPENDENT_AMBULATORY_CARE_PROVIDER_SITE_OTHER): Payer: BC Managed Care – PPO | Admitting: Family

## 2013-08-21 ENCOUNTER — Other Ambulatory Visit (HOSPITAL_COMMUNITY)
Admission: RE | Admit: 2013-08-21 | Discharge: 2013-08-21 | Disposition: A | Discharge status: Home / Self Care | Payer: BC Managed Care – PPO | Source: Ambulatory Visit | Attending: Family | Admitting: Family

## 2013-08-21 ENCOUNTER — Encounter: Payer: Self-pay | Admitting: Family

## 2013-08-21 VITALS — BP 130/80 | HR 82 | Ht 71.0 in | Wt 247.0 lb

## 2013-08-21 DIAGNOSIS — E785 Hyperlipidemia, unspecified: Secondary | ICD-10-CM

## 2013-08-21 DIAGNOSIS — Z01419 Encounter for gynecological examination (general) (routine) without abnormal findings: Secondary | ICD-10-CM | POA: Insufficient documentation

## 2013-08-21 DIAGNOSIS — Z0000 Encounter for general adult medical examination without abnormal findings: Secondary | ICD-10-CM

## 2013-08-21 DIAGNOSIS — Z1231 Encounter for screening mammogram for malignant neoplasm of breast: Secondary | ICD-10-CM

## 2013-08-21 DIAGNOSIS — I10 Essential (primary) hypertension: Secondary | ICD-10-CM

## 2013-08-21 DIAGNOSIS — Z124 Encounter for screening for malignant neoplasm of cervix: Secondary | ICD-10-CM

## 2013-08-21 NOTE — Progress Notes (Signed)
Pre visit review using our clinic review tool, if applicable. No additional management support is needed unless otherwise documented below in the visit note. 

## 2013-08-21 NOTE — Patient Instructions (Signed)
Fat and Cholesterol Control Diet  Fat and cholesterol levels in your blood and organs are influenced by your diet. High levels of fat and cholesterol may lead to diseases of the heart, small and large blood vessels, gallbladder, liver, and pancreas.  CONTROLLING FAT AND CHOLESTEROL WITH DIET  Although exercise and lifestyle factors are important, your diet is key. That is because certain foods are known to raise cholesterol and others to lower it. The goal is to balance foods for their effect on cholesterol and more importantly, to replace saturated and trans fat with other types of fat, such as monounsaturated fat, polyunsaturated fat, and omega-3 fatty acids.  On average, a person should consume no more than 15 to 17 g of saturated fat daily. Saturated and trans fats are considered "bad" fats, and they will raise LDL cholesterol. Saturated fats are primarily found in animal products such as meats, butter, and cream. However, that does not mean you need to give up all your favorite foods. Today, there are good tasting, low-fat, low-cholesterol substitutes for most of the things you like to eat. Choose low-fat or nonfat alternatives. Choose round or loin cuts of red meat. These types of cuts are lowest in fat and cholesterol. Chicken (without the skin), fish, veal, and ground turkey breast are great choices. Eliminate fatty meats, such as hot dogs and salami. Even shellfish have little or no saturated fat. Have a 3 oz (85 g) portion when you eat lean meat, poultry, or fish.  Trans fats are also called "partially hydrogenated oils." They are oils that have been scientifically manipulated so that they are solid at room temperature resulting in a longer shelf life and improved taste and texture of foods in which they are added. Trans fats are found in stick margarine, some tub margarines, cookies, crackers, and baked goods.   When baking and cooking, oils are a great substitute for butter. The monounsaturated oils are  especially beneficial since it is believed they lower LDL and raise HDL. The oils you should avoid entirely are saturated tropical oils, such as coconut and palm.   Remember to eat a lot from food groups that are naturally free of saturated and trans fat, including fish, fruit, vegetables, beans, grains (barley, rice, couscous, bulgur wheat), and pasta (without cream sauces).   IDENTIFYING FOODS THAT LOWER FAT AND CHOLESTEROL   Soluble fiber may lower your cholesterol. This type of fiber is found in fruits such as apples, vegetables such as broccoli, potatoes, and carrots, legumes such as beans, peas, and lentils, and grains such as barley. Foods fortified with plant sterols (phytosterol) may also lower cholesterol. You should eat at least 2 g per day of these foods for a cholesterol lowering effect.   Read package labels to identify low-saturated fats, trans fat free, and low-fat foods at the supermarket. Select cheeses that have only 2 to 3 g saturated fat per ounce. Use a heart-healthy tub margarine that is free of trans fats or partially hydrogenated oil. When buying baked goods (cookies, crackers), avoid partially hydrogenated oils. Breads and muffins should be made from whole grains (whole-wheat or whole oat flour, instead of "flour" or "enriched flour"). Buy non-creamy canned soups with reduced salt and no added fats.   FOOD PREPARATION TECHNIQUES   Never deep-fry. If you must fry, either stir-fry, which uses very little fat, or use non-stick cooking sprays. When possible, broil, bake, or roast meats, and steam vegetables. Instead of putting butter or margarine on vegetables, use lemon   and herbs, applesauce, and cinnamon (for squash and sweet potatoes). Use nonfat yogurt, salsa, and low-fat dressings for salads.   LOW-SATURATED FAT / LOW-FAT FOOD SUBSTITUTES  Meats / Saturated Fat (g)  · Avoid: Steak, marbled (3 oz/85 g) / 11 g  · Choose: Steak, lean (3 oz/85 g) / 4 g  · Avoid: Hamburger (3 oz/85 g) / 7  g  · Choose: Hamburger, lean (3 oz/85 g) / 5 g  · Avoid: Ham (3 oz/85 g) / 6 g  · Choose: Ham, lean cut (3 oz/85 g) / 2.4 g  · Avoid: Chicken, with skin, dark meat (3 oz/85 g) / 4 g  · Choose: Chicken, skin removed, dark meat (3 oz/85 g) / 2 g  · Avoid: Chicken, with skin, light meat (3 oz/85 g) / 2.5 g  · Choose: Chicken, skin removed, light meat (3 oz/85 g) / 1 g  Dairy / Saturated Fat (g)  · Avoid: Whole milk (1 cup) / 5 g  · Choose: Low-fat milk, 2% (1 cup) / 3 g  · Choose: Low-fat milk, 1% (1 cup) / 1.5 g  · Choose: Skim milk (1 cup) / 0.3 g  · Avoid: Hard cheese (1 oz/28 g) / 6 g  · Choose: Skim milk cheese (1 oz/28 g) / 2 to 3 g  · Avoid: Cottage cheese, 4% fat (1 cup) / 6.5 g  · Choose: Low-fat cottage cheese, 1% fat (1 cup) / 1.5 g  · Avoid: Ice cream (1 cup) / 9 g  · Choose: Sherbet (1 cup) / 2.5 g  · Choose: Nonfat frozen yogurt (1 cup) / 0.3 g  · Choose: Frozen fruit bar / trace  · Avoid: Whipped cream (1 tbs) / 3.5 g  · Choose: Nondairy whipped topping (1 tbs) / 1 g  Condiments / Saturated Fat (g)  · Avoid: Mayonnaise (1 tbs) / 2 g  · Choose: Low-fat mayonnaise (1 tbs) / 1 g  · Avoid: Butter (1 tbs) / 7 g  · Choose: Extra light margarine (1 tbs) / 1 g  · Avoid: Coconut oil (1 tbs) / 11.8 g  · Choose: Olive oil (1 tbs) / 1.8 g  · Choose: Corn oil (1 tbs) / 1.7 g  · Choose: Safflower oil (1 tbs) / 1.2 g  · Choose: Sunflower oil (1 tbs) / 1.4 g  · Choose: Soybean oil (1 tbs) / 2.4 g  · Choose: Canola oil (1 tbs) / 1 g  Document Released: 08/02/2005 Document Revised: 11/27/2012 Document Reviewed: 01/21/2011  [employer]® Patient Information ©2014 [employer].

## 2013-08-21 NOTE — Progress Notes (Signed)
Subjective:    Patient ID: [name], female    DOB: 02-Feb-1971, 43 y.o.   MRN: [id number]  HPI 43 year old [name] female, nonsmoker presenting today for routine physical examination.  Reviewed all health maintenance protocols including mammogram.  Her labs were reviewed. She denies any problems at this time.  She says right calf  gets hot occasionally but it is not painful. Symptoms ongoing x 1 month, occuring nearly everyday, lasting 1-2 minutes. No activity associated wit the warmth.    Review of Systems  Constitutional: Negative.   HENT: Negative.   Eyes: Negative.   Respiratory: Negative.   Cardiovascular: Negative.   Gastrointestinal: Negative.   Endocrine: Negative.   Genitourinary: Negative.   Musculoskeletal: Negative.   Skin: Negative.   Allergic/Immunologic: Negative.   Neurological: Negative.        Hotflash type sensation in the right leg  Hematological: Negative.   Psychiatric/Behavioral: Negative.    Past Medical History  Diagnosis Date  . Hypertension     History   Social History  . Marital Status: Married    Spouse Name: N/A    Number of Children: N/A  . Years of Education: N/A   Occupational History  . Not on file.   Social History Main Topics  . Smoking status: Never Smoker   . Smokeless tobacco: Never Used  . Alcohol Use: Yes     Comment: once every other month  . Drug Use: No  . Sexual Activity: Yes   Other Topics Concern  . Not on file   Social History Narrative  . No narrative on file    Past Surgical History  Procedure Laterality Date  . Abdominal hysterectomy      Family History  Problem Relation Age of Onset  . Hypertension Other     Allergies  Allergen Reactions  . Tylox [Oxycodone-Acetaminophen]     Caused face to swell    Current Outpatient Prescriptions on File Prior to Visit  Medication Sig Dispense Refill  . Olmesartan-Amlodipine-HCTZ (TRIBENZOR) 40-5-25 MG TABS Take 1 tablet by mouth 1 day or 1 dose.  30  tablet  6   No current facility-administered medications on file prior to visit.    BP 130/80  Pulse 82  Ht 5\' 11"  (1.803 m)  Wt 247 lb (112.038 kg)  BMI 34.46 kg/m2chart    Objective:   Physical Exam  Constitutional: She is oriented to person, place, and time. She appears well-developed and well-nourished.  HENT:  Head: Normocephalic.  Right Ear: External ear normal.  Left Ear: External ear normal.  Eyes: Pupils are equal, round, and reactive to light.  Neck: Normal range of motion. Neck supple.  Cardiovascular: Normal rate and regular rhythm.   Pulmonary/Chest: Effort normal and breath sounds normal.  Abdominal: Soft. Bowel sounds are normal.  Genitourinary: Vagina normal. No vaginal discharge found.  Musculoskeletal: Normal range of motion.  Neurological: She is alert and oriented to person, place, and time. She has normal reflexes.  Skin: Skin is warm and dry.  Psychiatric: She has a normal mood and affect. Her behavior is normal. Judgment and thought content normal.          Assessment & Plan:  Assessment 1. Routine Physical Exam 2. Hyperlipidemia 3. Obesity  Plan 1. Continue current medication.  2.  Lose 10% of body weight. 3.Lifestyle modification.  4. Return in 3 months for cholesterol check.  5. Contact office for questions or concerns. Consider referral to neuro if  sensation in the leg continues.

## 2013-09-14 ENCOUNTER — Telehealth: Payer: Self-pay | Admitting: Internal Medicine

## 2013-09-19 NOTE — Telephone Encounter (Signed)
Relevant patient education mailed to patient.  

## 2013-09-28 ENCOUNTER — Ambulatory Visit: Payer: Self-pay

## 2013-09-28 ENCOUNTER — Ambulatory Visit: Payer: BC Managed Care – PPO

## 2013-09-28 ENCOUNTER — Ambulatory Visit
Admission: RE | Admit: 2013-09-28 | Discharge: 2013-09-28 | Disposition: A | Discharge status: Home / Self Care | Payer: BC Managed Care – PPO | Source: Ambulatory Visit | Attending: Family | Admitting: Family

## 2013-09-28 DIAGNOSIS — Z1231 Encounter for screening mammogram for malignant neoplasm of breast: Secondary | ICD-10-CM

## 2013-10-10 ENCOUNTER — Other Ambulatory Visit: Payer: Self-pay | Admitting: Family

## 2013-10-10 DIAGNOSIS — R928 Other abnormal and inconclusive findings on diagnostic imaging of breast: Secondary | ICD-10-CM

## 2013-10-19 ENCOUNTER — Ambulatory Visit
Admission: RE | Admit: 2013-10-19 | Discharge: 2013-10-19 | Disposition: A | Discharge status: Home / Self Care | Payer: BC Managed Care – PPO | Source: Ambulatory Visit | Attending: Family | Admitting: Family

## 2013-10-19 DIAGNOSIS — R928 Other abnormal and inconclusive findings on diagnostic imaging of breast: Secondary | ICD-10-CM

## 2013-11-19 ENCOUNTER — Ambulatory Visit: Payer: BC Managed Care – PPO | Admitting: Family

## 2013-11-19 DIAGNOSIS — Z0289 Encounter for other administrative examinations: Secondary | ICD-10-CM

## 2014-06-26 ENCOUNTER — Telehealth: Payer: Self-pay | Admitting: Internal Medicine

## 2014-06-26 NOTE — Telephone Encounter (Signed)
Pt would like to est with NP. Can I sch? °

## 2014-06-26 NOTE — Telephone Encounter (Signed)
Ok to schedule.

## 2014-07-01 NOTE — Telephone Encounter (Signed)
lmom for pt to cb

## 2014-07-05 NOTE — Telephone Encounter (Signed)
Pt has been sch

## 2014-08-07 ENCOUNTER — Ambulatory Visit (INDEPENDENT_AMBULATORY_CARE_PROVIDER_SITE_OTHER): Payer: BC Managed Care – PPO | Admitting: Family

## 2014-08-07 ENCOUNTER — Encounter: Payer: Self-pay | Admitting: Family

## 2014-08-07 VITALS — BP 120/84 | HR 100 | Temp 98.4°F | Ht 71.0 in | Wt 247.4 lb

## 2014-08-07 DIAGNOSIS — D489 Neoplasm of uncertain behavior, unspecified: Secondary | ICD-10-CM | POA: Insufficient documentation

## 2014-08-07 DIAGNOSIS — Z87898 Personal history of other specified conditions: Secondary | ICD-10-CM

## 2014-08-07 DIAGNOSIS — I10 Essential (primary) hypertension: Secondary | ICD-10-CM

## 2014-08-07 DIAGNOSIS — Z8603 Personal history of neoplasm of uncertain behavior: Secondary | ICD-10-CM

## 2014-08-07 DIAGNOSIS — L910 Hypertrophic scar: Secondary | ICD-10-CM

## 2014-08-07 NOTE — Patient Instructions (Signed)
1. [facility] [place], [facility].  2. [place] [facility]    Low-Sodium Eating Plan Sodium raises blood pressure and causes water to be held in the body. Getting less sodium from food will help lower your blood pressure, reduce any swelling, and protect your heart, liver, and kidneys. We get sodium by adding salt (sodium chloride) to food. Most of our sodium comes from canned, boxed, and frozen foods. Restaurant foods, fast foods, and pizza are also very high in sodium. Even if you take medicine to lower your blood pressure or to reduce fluid in your body, getting less sodium from your food is important. WHAT IS MY PLAN? Most people should limit their sodium intake to 2,300 mg a day. Your health care provider recommends that you limit your sodium intake to __________ a day.  WHAT DO I NEED TO KNOW ABOUT THIS EATING PLAN? For the low-sodium eating plan, you will follow these general guidelines:  Choose foods with a % Daily Value for sodium of less than 5% (as listed on the food label).   Use salt-free seasonings or herbs instead of table salt or sea salt.   Check with your health care provider or pharmacist before using salt substitutes.   Eat fresh foods.  Eat more vegetables and fruits.  Limit canned vegetables. If you do use them, rinse them well to decrease the sodium.   Limit cheese to 1 oz (28 g) per day.   Eat lower-sodium products, often labeled as "lower sodium" or "no salt added."  Avoid foods that contain monosodium glutamate (MSG). MSG is sometimes added to [place] food and some canned foods.  Check food labels (Nutrition Facts labels) on foods to learn how much sodium is in one serving.  Eat more home-cooked food and less restaurant, buffet, and fast food.  When eating at a restaurant, ask that your food be prepared with less salt or none, if possible.  HOW DO I READ FOOD LABELS FOR SODIUM INFORMATION? The Nutrition Facts label lists the  amount of sodium in one serving of the food. If you eat more than one serving, you must multiply the listed amount of sodium by the number of servings. Food labels may also identify foods as:  Sodium free--Less than 5 mg in a serving.  Very low sodium--35 mg or less in a serving.  Low sodium--140 mg or less in a serving.  Light in sodium--50% less sodium in a serving. For example, if a food that usually has 300 mg of sodium is changed to become light in sodium, it will have 150 mg of sodium.  Reduced sodium--25% less sodium in a serving. For example, if a food that usually has 400 mg of sodium is changed to reduced sodium, it will have 300 mg of sodium. WHAT FOODS CAN I EAT? Grains Low-sodium cereals, including oats, puffed wheat and rice, and shredded wheat cereals. Low-sodium crackers. Unsalted rice and pasta. Lower-sodium bread.  Vegetables Frozen or fresh vegetables. Low-sodium or reduced-sodium canned vegetables. Low-sodium or reduced-sodium tomato sauce and paste. Low-sodium or reduced-sodium tomato and vegetable juices.  Fruits Fresh, frozen, and canned fruit. Fruit juice.  Meat and Other Protein Products Low-sodium canned tuna and salmon. Fresh or frozen meat, poultry, seafood, and fish. Lamb. Unsalted nuts. Dried beans, peas, and lentils without added salt. Unsalted canned beans. Homemade soups without salt. Eggs.  Dairy Milk. Soy milk. Ricotta cheese. Low-sodium or reduced-sodium cheeses. Yogurt.  Condiments Fresh and dried herbs and spices. Salt-free seasonings. Onion and  garlic powders. Low-sodium varieties of mustard and ketchup. Lemon juice.  Fats and Oils Reduced-sodium salad dressings. Unsalted butter.  Other Unsalted popcorn and pretzels.  The items listed above may not be a complete list of recommended foods or beverages. Contact your dietitian for more options. WHAT FOODS ARE NOT RECOMMENDED? Grains Instant hot cereals. Bread stuffing, pancake, and  biscuit mixes. Croutons. Seasoned rice or pasta mixes. Noodle soup cups. Boxed or frozen macaroni and cheese. Self-rising flour. Regular salted crackers. Vegetables Regular canned vegetables. Regular canned tomato sauce and paste. Regular tomato and vegetable juices. Frozen vegetables in sauces. Salted french fries. Olives. Angie Fava. Relishes. Sauerkraut. Salsa. Meat and Other Protein Products Salted, canned, smoked, spiced, or pickled meats, seafood, or fish. Bacon, ham, sausage, hot dogs, corned beef, chipped beef, and packaged luncheon meats. Salt pork. Jerky. Pickled herring. Anchovies, regular canned tuna, and sardines. Salted nuts. Dairy Processed cheese and cheese spreads. Cheese curds. Blue cheese and cottage cheese. Buttermilk.  Condiments Onion and garlic salt, seasoned salt, table salt, and sea salt. Canned and packaged gravies. Worcestershire sauce. Tartar sauce. Barbecue sauce. Teriyaki sauce. Soy sauce, including reduced sodium. Steak sauce. Fish sauce. Oyster sauce. Cocktail sauce. Horseradish. Regular ketchup and mustard. Meat flavorings and tenderizers. Bouillon cubes. Hot sauce. Tabasco sauce. Marinades. Taco seasonings. Relishes. Fats and Oils Regular salad dressings. Salted butter. Margarine. Ghee. Bacon fat.  Other Potato and tortilla chips. Corn chips and puffs. Salted popcorn and pretzels. Canned or dried soups. Pizza. Frozen entrees and pot pies.  The items listed above may not be a complete list of foods and beverages to avoid. Contact your dietitian for more information. Document Released: 01/22/2002 Document Revised: 08/07/2013 Document Reviewed: 06/06/2013 [facility] Patient Information 2015 [place], [place]. This information is not intended to replace advice given to you by your health care provider. Make sure you discuss any questions you have with your health care provider.

## 2014-08-07 NOTE — Progress Notes (Signed)
Pre visit review using our clinic review tool, if applicable. No additional management support is needed unless otherwise documented below in the visit note. 

## 2014-08-08 NOTE — Progress Notes (Signed)
Subjective:    Patient ID: [name], female    DOB: 15-Jul-1971, 43 y.o.   MRN: [id number]  HPI 43 year old African-American female, nonsmoker with a history of hyperlipidemia, hypertension, keloid scarring, teratomas in today to be established. Has concerns today of left hip pain has been ongoing 2 weeks off and on. Pain is worse with sitting. Takes Aleve that does help her symptoms some. Denies any injury. Pain a 4 out of 10. 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 Had a tubal pregnancy requiring oophorectomy in her 23s. She's currently stable on blood pressure medication.   Review of Systems  Constitutional: Negative.   HENT: Negative.   Respiratory: Negative.   Cardiovascular: Negative.   Gastrointestinal: Negative.   Endocrine: Negative.   Genitourinary: Negative.   Musculoskeletal: Positive for arthralgias. Negative for gait problem.       Left hip pain  Skin: Negative.   Allergic/Immunologic: Negative.   Neurological: Negative.   Hematological: Negative.   Psychiatric/Behavioral: Negative.    Past Medical History  Diagnosis Date  . Hypertension     History   Social History  . Marital Status: Married    Spouse Name: N/A    Number of Children: N/A  . Years of Education: N/A   Occupational History  . Not on file.   Social History Main Topics  . Smoking status: Never Smoker   . Smokeless tobacco: Never Used  . Alcohol Use: Yes     Comment: once every other month  . Drug Use: No  . Sexual Activity: Yes   Other Topics Concern  . Not on file   Social History Narrative    Past Surgical History  Procedure Laterality Date  . Abdominal hysterectomy      Family History  Problem Relation Age of Onset  . Hypertension Other     Allergies  Allergen Reactions  . Tylox [Oxycodone-Acetaminophen]     Caused face to swell    Current Outpatient Prescriptions on File Prior to Visit  Medication  Sig Dispense Refill  . Olmesartan-Amlodipine-HCTZ (TRIBENZOR) 40-5-25 MG TABS Take 1 tablet by mouth 1 day or 1 dose. 30 tablet 6   No current facility-administered medications on file prior to visit.    BP 120/84 mmHg  Pulse 100  Temp(Src) 98.4 F (36.9 C) (Oral)  Ht 5\' 11"  (1.803 m)  Wt 247 lb 6.4 oz (112.22 kg)  BMI 34.52 kg/m2chart    Objective:   Physical Exam  Constitutional: She is oriented to person, place, and time. She appears well-developed and well-nourished.  HENT:  Right Ear: External ear normal.  Left Ear: External ear normal.  Nose: Nose normal.  Mouth/Throat: Oropharynx is clear and moist.  Neck: Normal range of motion. Neck supple. No thyromegaly present.  Cardiovascular: Normal rate, regular rhythm and normal heart sounds.   Pulmonary/Chest: Effort normal and breath sounds normal.  Abdominal: Soft. Bowel sounds are normal.  Musculoskeletal: Normal range of motion. She exhibits no edema or tenderness.  Neurological: She is alert and oriented to person, place, and time.  Skin: Skin is warm and dry.  Psychiatric: She has a normal mood and affect.          Assessment & Plan:  Dezarai was seen today for establish care.  Diagnoses and associated orders for this visit:  Essential hypertension  Keloid scar  History of thoracic teratoma  Teratoma    Consider x-ray of the left hip  if symptoms persist. Continue Aleve for now. Continue current blood pressure medication. Return in 3 months for well care exam and recheck of cholesterol fasting.

## 2014-09-16 ENCOUNTER — Telehealth: Payer: Self-pay | Admitting: Family

## 2014-09-16 NOTE — Telephone Encounter (Signed)
Pt was sch for cpx on 09-27-14 np is not in buildng. Pt would like to est with dr Regis Bill. Pt kids sees dr Regis Bill. Can I sch?

## 2014-09-16 NOTE — Telephone Encounter (Signed)
Ok   But please make sure at least 3 sdas left on a given day

## 2014-09-17 NOTE — Telephone Encounter (Signed)
Ok   if new medical problems  May need another visit

## 2014-09-17 NOTE — Telephone Encounter (Signed)
Can I just sch pt for cpx? Pt already sch for labs

## 2014-09-17 NOTE — Telephone Encounter (Signed)
lmom for pt to cb

## 2014-09-18 NOTE — Telephone Encounter (Signed)
Pt will kept last cpx with np and will est with dr Regis Bill

## 2014-09-20 ENCOUNTER — Other Ambulatory Visit (INDEPENDENT_AMBULATORY_CARE_PROVIDER_SITE_OTHER): Payer: BLUE CROSS/BLUE SHIELD

## 2014-09-20 DIAGNOSIS — Z0000 Encounter for general adult medical examination without abnormal findings: Secondary | ICD-10-CM

## 2014-09-20 LAB — POCT URINALYSIS DIPSTICK
GLUCOSE UA: NEGATIVE
Ketones, UA: NEGATIVE
LEUKOCYTES UA: NEGATIVE
NITRITE UA: NEGATIVE
PROTEIN UA: NEGATIVE
Spec Grav, UA: 1.025
Urobilinogen, UA: 0.2
pH, UA: 5

## 2014-09-20 LAB — COMPREHENSIVE METABOLIC PANEL
ALBUMIN: 4.5 g/dL (ref 3.5–5.2)
ALT: 19 U/L (ref 0–35)
AST: 15 U/L (ref 0–37)
Alkaline Phosphatase: 104 U/L (ref 39–117)
BUN: 14 mg/dL (ref 6–23)
CALCIUM: 10.1 mg/dL (ref 8.4–10.5)
CHLORIDE: 103 meq/L (ref 96–112)
CO2: 27 mEq/L (ref 19–32)
Creatinine, Ser: 0.81 mg/dL (ref 0.40–1.20)
GFR: 98.66 mL/min (ref >60.00)
Glucose, Bld: 96 mg/dL (ref 70–99)
Potassium: 3.6 mEq/L (ref 3.5–5.1)
Sodium: 139 mEq/L (ref 135–145)
Total Bilirubin: 0.6 mg/dL (ref 0.2–1.2)
Total Protein: 8.1 g/dL (ref 6.0–8.3)

## 2014-09-20 LAB — LIPID PANEL
Cholesterol: 220 mg/dL — ABNORMAL HIGH (ref 0–200)
HDL: 44.2 mg/dL (ref >39.00)
LDL Cholesterol: 150 mg/dL — ABNORMAL HIGH (ref 0–99)
NonHDL: 175.8
Total CHOL/HDL Ratio: 5
Triglycerides: 128 mg/dL (ref 0.0–149.0)
VLDL: 25.6 mg/dL (ref 0.0–40.0)

## 2014-09-20 LAB — CBC WITH DIFFERENTIAL/PLATELET
BASOS ABS: 0 10*3/uL (ref 0.0–0.1)
BASOS PCT: 0.4 % (ref 0.0–3.0)
Eosinophils Absolute: 0.1 10*3/uL (ref 0.0–0.7)
Eosinophils Relative: 1.1 % (ref 0.0–5.0)
HCT: 44 % (ref 36.0–46.0)
Hemoglobin: 15 g/dL (ref 12.0–15.0)
LYMPHS PCT: 45.1 % (ref 12.0–46.0)
Lymphs Abs: 4.1 10*3/uL — ABNORMAL HIGH (ref 0.7–4.0)
MCHC: 34.1 g/dL (ref 30.0–36.0)
MCV: 88.6 fl (ref 78.0–100.0)
Monocytes Absolute: 0.5 10*3/uL (ref 0.1–1.0)
Monocytes Relative: 6 % (ref 3.0–12.0)
NEUTROS ABS: 4.3 10*3/uL (ref 1.4–7.7)
NEUTROS PCT: 47.4 % (ref 43.0–77.0)
PLATELETS: 311 10*3/uL (ref 150.0–400.0)
RBC: 4.96 Mil/uL (ref 3.87–5.11)
RDW: 13.3 % (ref 11.5–15.5)
WBC: 9.1 10*3/uL (ref 4.0–10.5)

## 2014-09-20 LAB — TSH: TSH: 2.12 u[IU]/mL (ref 0.35–4.50)

## 2014-09-23 ENCOUNTER — Telehealth: Payer: Self-pay

## 2014-09-23 MED ORDER — SIMVASTATIN 10 MG PO TABS: 10.0000 mg | ORAL_TABLET | Freq: Every day | ORAL | Status: DC

## 2014-09-23 NOTE — Telephone Encounter (Signed)
Pt agrees to try simvastatin 10mg  qd and will discuss further at [place] on Friday.  Rx sent to pharmacy

## 2014-09-27 ENCOUNTER — Encounter: Payer: Self-pay | Admitting: Family

## 2014-09-27 ENCOUNTER — Ambulatory Visit (INDEPENDENT_AMBULATORY_CARE_PROVIDER_SITE_OTHER): Payer: BLUE CROSS/BLUE SHIELD | Admitting: Family

## 2014-09-27 ENCOUNTER — Encounter: Payer: BC Managed Care – PPO | Admitting: Family

## 2014-09-27 VITALS — BP 128/96 | Temp 97.6°F | Ht 71.0 in | Wt 250.5 lb

## 2014-09-27 DIAGNOSIS — L669 Cicatricial alopecia, unspecified: Secondary | ICD-10-CM

## 2014-09-27 DIAGNOSIS — E7800 Pure hypercholesterolemia, unspecified: Secondary | ICD-10-CM

## 2014-09-27 DIAGNOSIS — I10 Essential (primary) hypertension: Secondary | ICD-10-CM

## 2014-09-27 DIAGNOSIS — Z0000 Encounter for general adult medical examination without abnormal findings: Secondary | ICD-10-CM

## 2014-09-27 MED ORDER — DERMA-SMOOTHE/FS SCALP 0.01 % EX OIL: 1.0000 "application " | TOPICAL_OIL | Freq: Every day | CUTANEOUS | Status: DC

## 2014-09-27 MED ORDER — OLMESARTAN-AMLODIPINE-HCTZ 40-5-25 MG PO TABS: 1.0000 | ORAL_TABLET | ORAL | Status: DC

## 2014-09-27 NOTE — Progress Notes (Signed)
Pre visit review using our clinic review tool, if applicable. No additional management support is needed unless otherwise documented below in the visit note. 

## 2014-09-27 NOTE — Progress Notes (Signed)
Subjective:    Patient ID: [name], female    DOB: 06/05/1971, 44 y.o.   MRN: [id number]  HPI   44 y.o., female, nonsmoker seen today for physical exam. She presents today with two major concerns: Alopecia and lower lumbar pain that radiates to the left side of her buttocks. Patient states hair loss began one year ago as shedding and progressed to hair loss resulting in patches of baldness.  She reports lumbar pain has increased in frequency over the past month.  Pain ranges from aching, dull, to tingling sensation. Pain relieved with rest. Patient reports no regular exercise and sits in a car for extended hours due to employment.   Review of Systems  Constitutional: Negative.   HENT: Negative.        Balding patches.  See HPI.  Eyes: Negative.   Respiratory: Negative.   Cardiovascular: Negative.   Gastrointestinal: Negative.   Endocrine: Negative.   Genitourinary: Negative.   Musculoskeletal: Negative.   Skin: Negative.        Keloids neck, torso, arms bilaterally.  Allergic/Immunologic: Negative.   Hematological: Negative.   Psychiatric/Behavioral: Negative.    Past Medical History  Diagnosis Date  . Hypertension     History   Social History  . Marital Status: Married    Spouse Name: N/A  . Number of Children: N/A  . Years of Education: N/A   Occupational History  . Not on file.   Social History Main Topics  . Smoking status: Never Smoker   . Smokeless tobacco: Never Used  . Alcohol Use: Yes     Comment: once every other month  . Drug Use: No  . Sexual Activity: Yes   Other Topics Concern  . Not on file   Social History Narrative    Past Surgical History  Procedure Laterality Date  . Abdominal hysterectomy      Family History  Problem Relation Age of Onset  . Hypertension Other     Allergies  Allergen Reactions  . Tylox [Oxycodone-Acetaminophen]     Caused face to swell    Current Outpatient Prescriptions on File Prior to Visit    Medication Sig Dispense Refill  . simvastatin (ZOCOR) 10 MG tablet Take 1 tablet (10 mg total) by mouth at bedtime. 30 tablet 3   No current facility-administered medications on file prior to visit.    BP 128/96 mmHg  Temp(Src) 97.6 F (36.4 C) (Oral)  Ht 5\' 11"  (1.803 m)  Wt 250 lb 8 oz (113.626 kg)  BMI 34.95 kg/m2chart    Objective:   Physical Exam  Constitutional: She is oriented to person, place, and time. She appears well-developed and well-nourished.  HENT:  Head: Normocephalic.  Right Ear: External ear normal.  Left Ear: External ear normal.  Nose: Nose normal.  Mouth/Throat: Oropharynx is clear and moist.  Tension alopecia prominent on frontal portion of head. Bald patches absent of signs of hair growth.  Eyes: Conjunctivae and EOM are normal. Pupils are equal, round, and reactive to light.  Neck: Normal range of motion. Neck supple. No thyromegaly present.  Keloid on left anterior laterally on neck.  Cardiovascular: Normal rate, regular rhythm and normal heart sounds.   Pulmonary/Chest: Effort normal and breath sounds normal.  Abdominal: Soft. Bowel sounds are normal.  Musculoskeletal: Normal range of motion. She exhibits edema.  Pain elicited P6-P9  with adduction and abduction of left leg.    Neurological: She is alert and oriented to person, place, and  time. She has normal reflexes. She displays normal reflexes. No cranial nerve deficit. Coordination normal.  Skin: Skin is warm and dry.  Psychiatric: She has a normal mood and affect. Her behavior is normal. Judgment and thought content normal.          Assessment & Plan:  .[name] was seen today for annual exam.  Diagnoses and all orders for this visit:  Preventative health care  Essential hypertension Orders: -     Olmesartan-Amlodipine-HCTZ (TRIBENZOR) 40-5-25 MG TABS; Take 1 tablet by mouth 1 day or 1 dose.  Pure hypercholesterolemia  Essential hypertension, benign Orders: -      Olmesartan-Amlodipine-HCTZ (TRIBENZOR) 40-5-25 MG TABS; Take 1 tablet by mouth 1 day or 1 dose.  Alopecia, scarring  Other orders -     Fluocinolone Acetonide (DERMA-SMOOTHE/FS SCALP) 0.01 % OIL; Apply 1 application topically daily.  Patient refused treatment for lumbar radiculopathy at present.  Advised if pain continues a MRI is indicated.  Alopecia-prescribed derma smooth and refrain from wearing hair extension.  Counseled on the importance of breast self exams.

## 2014-09-27 NOTE — Patient Instructions (Signed)
Sciatica Sciatica is pain, weakness, numbness, or tingling along the path of the sciatic nerve. The nerve starts in the lower back and runs down the back of each leg. The nerve controls the muscles in the lower leg and in the back of the knee, while also providing sensation to the back of the thigh, lower leg, and the sole of your foot. Sciatica is a symptom of another medical condition. For instance, nerve damage or certain conditions, such as a herniated disk or bone spur on the spine, pinch or put pressure on the sciatic nerve. This causes the pain, weakness, or other sensations normally associated with sciatica. Generally, sciatica only affects one side of the body. CAUSES   Herniated or slipped disc.  Degenerative disk disease.  A pain disorder involving the narrow muscle in the buttocks (piriformis syndrome).  Pelvic injury or fracture.  Pregnancy.  Tumor (rare). SYMPTOMS  Symptoms can vary from mild to very severe. The symptoms usually travel from the low back to the buttocks and down the back of the leg. Symptoms can include:  Mild tingling or dull aches in the lower back, leg, or hip.  Numbness in the back of the calf or sole of the foot.  Burning sensations in the lower back, leg, or hip.  Sharp pains in the lower back, leg, or hip.  Leg weakness.  Severe back pain inhibiting movement. These symptoms may get worse with coughing, sneezing, laughing, or prolonged sitting or standing. Also, being overweight may worsen symptoms. DIAGNOSIS  Your caregiver will perform a physical exam to look for common symptoms of sciatica. He or she may ask you to do certain movements or activities that would trigger sciatic nerve pain. Other tests may be performed to find the cause of the sciatica. These may include:  Blood tests.  X-rays.  Imaging tests, such as an MRI or CT scan. TREATMENT  Treatment is directed at the cause of the sciatic pain. Sometimes, treatment is not necessary  and the pain and discomfort goes away on its own. If treatment is needed, your caregiver may suggest:  Over-the-counter medicines to relieve pain.  Prescription medicines, such as anti-inflammatory medicine, muscle relaxants, or narcotics.  Applying heat or ice to the painful area.  Steroid injections to lessen pain, irritation, and inflammation around the nerve.  Reducing activity during periods of pain.  Exercising and stretching to strengthen your abdomen and improve flexibility of your spine. Your caregiver may suggest losing weight if the extra weight makes the back pain worse.  Physical therapy.  Surgery to eliminate what is pressing or pinching the nerve, such as a bone spur or part of a herniated disk. HOME CARE INSTRUCTIONS   Only take over-the-counter or prescription medicines for pain or discomfort as directed by your caregiver.  Apply ice to the affected area for 20 minutes, 3-4 times a day for the first 48-72 hours. Then try heat in the same way.  Exercise, stretch, or perform your usual activities if these do not aggravate your pain.  Attend physical therapy sessions as directed by your caregiver.  Keep all follow-up appointments as directed by your caregiver.  Do not wear high heels or shoes that do not provide proper support.  Check your mattress to see if it is too soft. A firm mattress may lessen your pain and discomfort. SEEK IMMEDIATE MEDICAL CARE IF:   You lose control of your bowel or bladder (incontinence).  You have increasing weakness in the lower back, pelvis, buttocks,   or legs.  You have redness or swelling of your back.  You have a burning sensation when you urinate.  You have pain that gets worse when you lie down or awakens you at night.  Your pain is worse than you have experienced in the past.  Your pain is lasting longer than 4 weeks.  You are suddenly losing weight without reason. MAKE SURE YOU:  Understand these  instructions.  Will watch your condition.  Will get help right away if you are not doing well or get worse. Document Released: 07/27/2001 Document Revised: 02/01/2012 Document Reviewed: 12/12/2011 [facility] Patient Information 2015 [place], [place]. This information is not intended to replace advice given to you by your health care provider. Make sure you discuss any questions you have with your health care provider. Alopecia Areata Alopecia areata is a self-destructing (autoimmune) disease that results in the loss of hair. In this condition your body's immune system attacks the hair follicle. The hair follicle is responsible for growing hair. Hair loss can occur on the scalp and other parts of the body. It usually starts as one or more small, round, smooth patches of hair loss. It occurs in males and females of all ages and races, but usually starts before age 33. The scalp is the most commonly affected area, but the beard or any hair-bearing site can be affected. This type of hair loss does not leave scars where the hair was lost.  Many people with alopecia areata only have a few patches of hair loss. In others, extensive patchy hair loss occurs. In a few people, all scalp hair is lost (alopecia totalis), or hair is lost from the entire scalp and body (alopecia universalis). No matter how widespread the hair loss, the hair follicles remain alive and are ready to resume normal hair production whenever they receive the correct signal. Hair re-growth may occur without treatment and can even restart after years of hair loss.  CAUSES  It is thought that something triggers the immune system to stop hair growth. It is not always known what the cause is. Some people have genetic markers that can increase the chance of developing alopecia areata. Alopecia areata often occurs in families whose members have had:  Asthma.  Hay fever.  Atopic eczema.  Some autoimmune diseases may also be a trigger, such  as:  Thyroid disease.  Diabetes.  Rheumatoid arthritis.  Lupus erythematosus.  Vitiligo.  Pernicious anemia.  Addison's disease. OTHER SYMPTOMS In some people, the nail beds may develop rows of tiny dents (stippling) or the nail beds can become distorted. Other than the hair and nail beds, no other body part is affected.  PROGNOSIS  Alopecia areata is not medically disabling. People with alopecia areata are usually in excellent health. Hair loss can be emotionally difficult. The [facility] has resources available to help individuals and families with alopecia areata. Their goal is to help people with the condition live full, productive lives. There are many successful, well-adjusted, contented people living with Alopecia areata. Alopecia areata can be overcome with:  A positive self image.  Sound medical facts.  The support of others, such as:  Sometimes professional counseling is helpful to develop one's self-confidence and positive self-image. TREATMENT  There is no cure for alopecia areata. There are several available treatments. Treatments are most effective in milder cases. No treatment is effective for everyone. Choice of treatment depends mainly on a person's age and the extent of their hair loss. Alopecia areata  occurs in two forms:   A mild patchy form where less than 50 percent of scalp hair is lost.  An extensive form where greater than 50 percent of scalp hair is lost. These two forms of alopecia areata behave quite differently, and the choice of treatment depends on which form is present. Current treatments do not turn alopecia areata off. They can stimulate the hair follicle to produce hair.  Some medications used to treat mild cases include:  Cortisone injections. The most common treatment is the injection of cortisone into the bare skin patches. The injections are usually given by a caregiver specializing in skin issues (dermatologist).  This caregiver will use a tiny needle to give multiple injections into the skin in and around the bare patches. The injections are repeated once a month. If new hair growth occurs, it is usually visible within 4 weeks. Treatment does not prevent new patches of hair loss from developing. There are few side effects from local cortisone injections. Occasionally, temporary dents (depressions) in the skin result from the local injections, but these dents can fill in by themselves.  Topical minoxidil. Five percent topical minoxidil solution applied twice daily may grow hair in alopecia areata. Scalp, eyebrows, and beard hair may respond. If scalp hair re-grows completely, treatment can be stopped. Response may improve if topical cortisone cream is applied 30 minutes after the minoxidil. Topical minoxidil is safe, easy to use, and does not lower blood pressure in persons with normal blood pressure. Minoxidil can lead to unwanted facial hair growth in some people.  Anthralin cream or ointment. Another treatment is the application of anthralin cream or ointment. Anthralin is a tar-like substance that has been used widely for psoriasis. Anthralin is applied to the bare patches once daily. It is washed off after a short time, usually 30 to 60 minutes later. If new hair growth occurs, it is seen in 8 to 12 weeks. Anthralin can be irritating to the skin. It can cause temporary, brownish discoloration of the treated skin. By using short treatment times, skin irritation and skin staining are reduced without decreasing effectiveness. Care must be taken not to get anthralin in the eyes. Some of the medications used for more extensive cases where there is greater than 50% hair loss include:  Cortisone pills. Cortisone pills are sometimes given for extensive scalp hair loss. Cortisone taken internally is much stronger than local injections of cortisone into the skin. It is necessary to discuss possible side effects of cortisone  pills with your caregiver. In general, however, cortisone pills are used in relatively few patients with alopecia areata due to health risks from prolonged use. Also, hair that has grown is likely to fall out when the cortisone pills are stopped.  Topical minoxidil. See previous explanation under mild, patchy alopecia areata. However, minoxidil is not effective in total loss of scalp hair (alopecia totalis).  Topical immunotherapy. Another method of treating alopecia areata or alopecia totalis/universalis involves producing an allergic rash or allergic contact dermatitis. Chemicals such as diphencyprone (DPCP) or squaric acid dibutyl ester (SADBE) are applied to the scalp to produce an allergic rash which resembles poison oak or ivy. Approximately 40% of patients treated with topical immunotherapy will re-grow scalp hair after about 6 months of treatment. Those who do successfully re-grow scalp hair will need to continue treatment to maintain hair re-growth.  Wigs. For extensive hair loss, a wig can be an important option for some people. Proper attention will make a quality wig look  completely natural. A wig will need to be cut, thinned, and styled. To keep a net base wig from falling off, special double-sided tape can be purchased in beauty supply outlets and fastened to the inside of the wig.  For those with completely bare heads, there are suction caps to which any wig can be attached. There are also entire suction cap wig units. FOR MORE INFORMATION National Alopecia Areata Foundation: https://www.berry.org/ Document Released: 03/06/2004 Document Revised: 10/25/2011 Document Reviewed: 10/22/2013 [facility] Patient Information 2015 [place], [place]. This information is not intended to replace advice given to you by your health care provider. Make sure you discuss any questions you have with your health care provider.

## 2014-10-11 ENCOUNTER — Other Ambulatory Visit: Payer: Self-pay

## 2014-10-28 ENCOUNTER — Emergency Department (HOSPITAL_COMMUNITY): Payer: BLUE CROSS/BLUE SHIELD

## 2014-10-28 ENCOUNTER — Encounter (HOSPITAL_COMMUNITY): Payer: Self-pay | Admitting: Emergency Medicine

## 2014-10-28 DIAGNOSIS — R0789 Other chest pain: Secondary | ICD-10-CM | POA: Diagnosis not present

## 2014-10-28 DIAGNOSIS — I10 Essential (primary) hypertension: Secondary | ICD-10-CM | POA: Diagnosis not present

## 2014-10-28 DIAGNOSIS — R0602 Shortness of breath: Secondary | ICD-10-CM | POA: Diagnosis present

## 2014-10-28 DIAGNOSIS — R1011 Right upper quadrant pain: Secondary | ICD-10-CM | POA: Diagnosis not present

## 2014-10-28 DIAGNOSIS — R110 Nausea: Secondary | ICD-10-CM | POA: Insufficient documentation

## 2014-10-28 DIAGNOSIS — Z79899 Other long term (current) drug therapy: Secondary | ICD-10-CM | POA: Diagnosis not present

## 2014-10-28 DIAGNOSIS — R0600 Dyspnea, unspecified: Secondary | ICD-10-CM | POA: Insufficient documentation

## 2014-10-28 LAB — CBC
HEMATOCRIT: 43 % (ref 36.0–46.0)
HEMOGLOBIN: 14.9 g/dL (ref 12.0–15.0)
MCH: 30.8 pg (ref 26.0–34.0)
MCHC: 34.7 g/dL (ref 30.0–36.0)
MCV: 88.8 fL (ref 78.0–100.0)
Platelets: 273 10*3/uL (ref 150–400)
RBC: 4.84 MIL/uL (ref 3.87–5.11)
RDW: 13.2 % (ref 11.5–15.5)
WBC: 9.8 10*3/uL (ref 4.0–10.5)

## 2014-10-28 LAB — BASIC METABOLIC PANEL
Anion gap: 9 (ref 5–15)
BUN: 15 mg/dL (ref 6–23)
CALCIUM: 10 mg/dL (ref 8.4–10.5)
CO2: 28 mmol/L (ref 19–32)
Chloride: 102 mmol/L (ref 96–112)
Creatinine, Ser: 0.87 mg/dL (ref 0.50–1.10)
GFR calc non Af Amer: 80 mL/min — ABNORMAL LOW (ref >90)
Glucose, Bld: 107 mg/dL — ABNORMAL HIGH (ref 70–99)
POTASSIUM: 3.6 mmol/L (ref 3.5–5.1)
SODIUM: 139 mmol/L (ref 135–145)

## 2014-10-28 LAB — I-STAT TROPONIN, ED: Troponin i, poc: 0.01 ng/mL (ref 0.00–0.08)

## 2014-10-28 LAB — BRAIN NATRIURETIC PEPTIDE: B NATRIURETIC PEPTIDE 5: 10 pg/mL (ref 0.0–100.0)

## 2014-10-28 IMAGING — DX DG CHEST 2V
2 series · 2 of 2 positions shown · non-contrast
Comparison: PA and lateral chest [DATE].

CLINICAL DATA: Chest pain and tightness for 2 days. Shortness of
breath.

EXAM:
CHEST  2 VIEW

[chest pa]
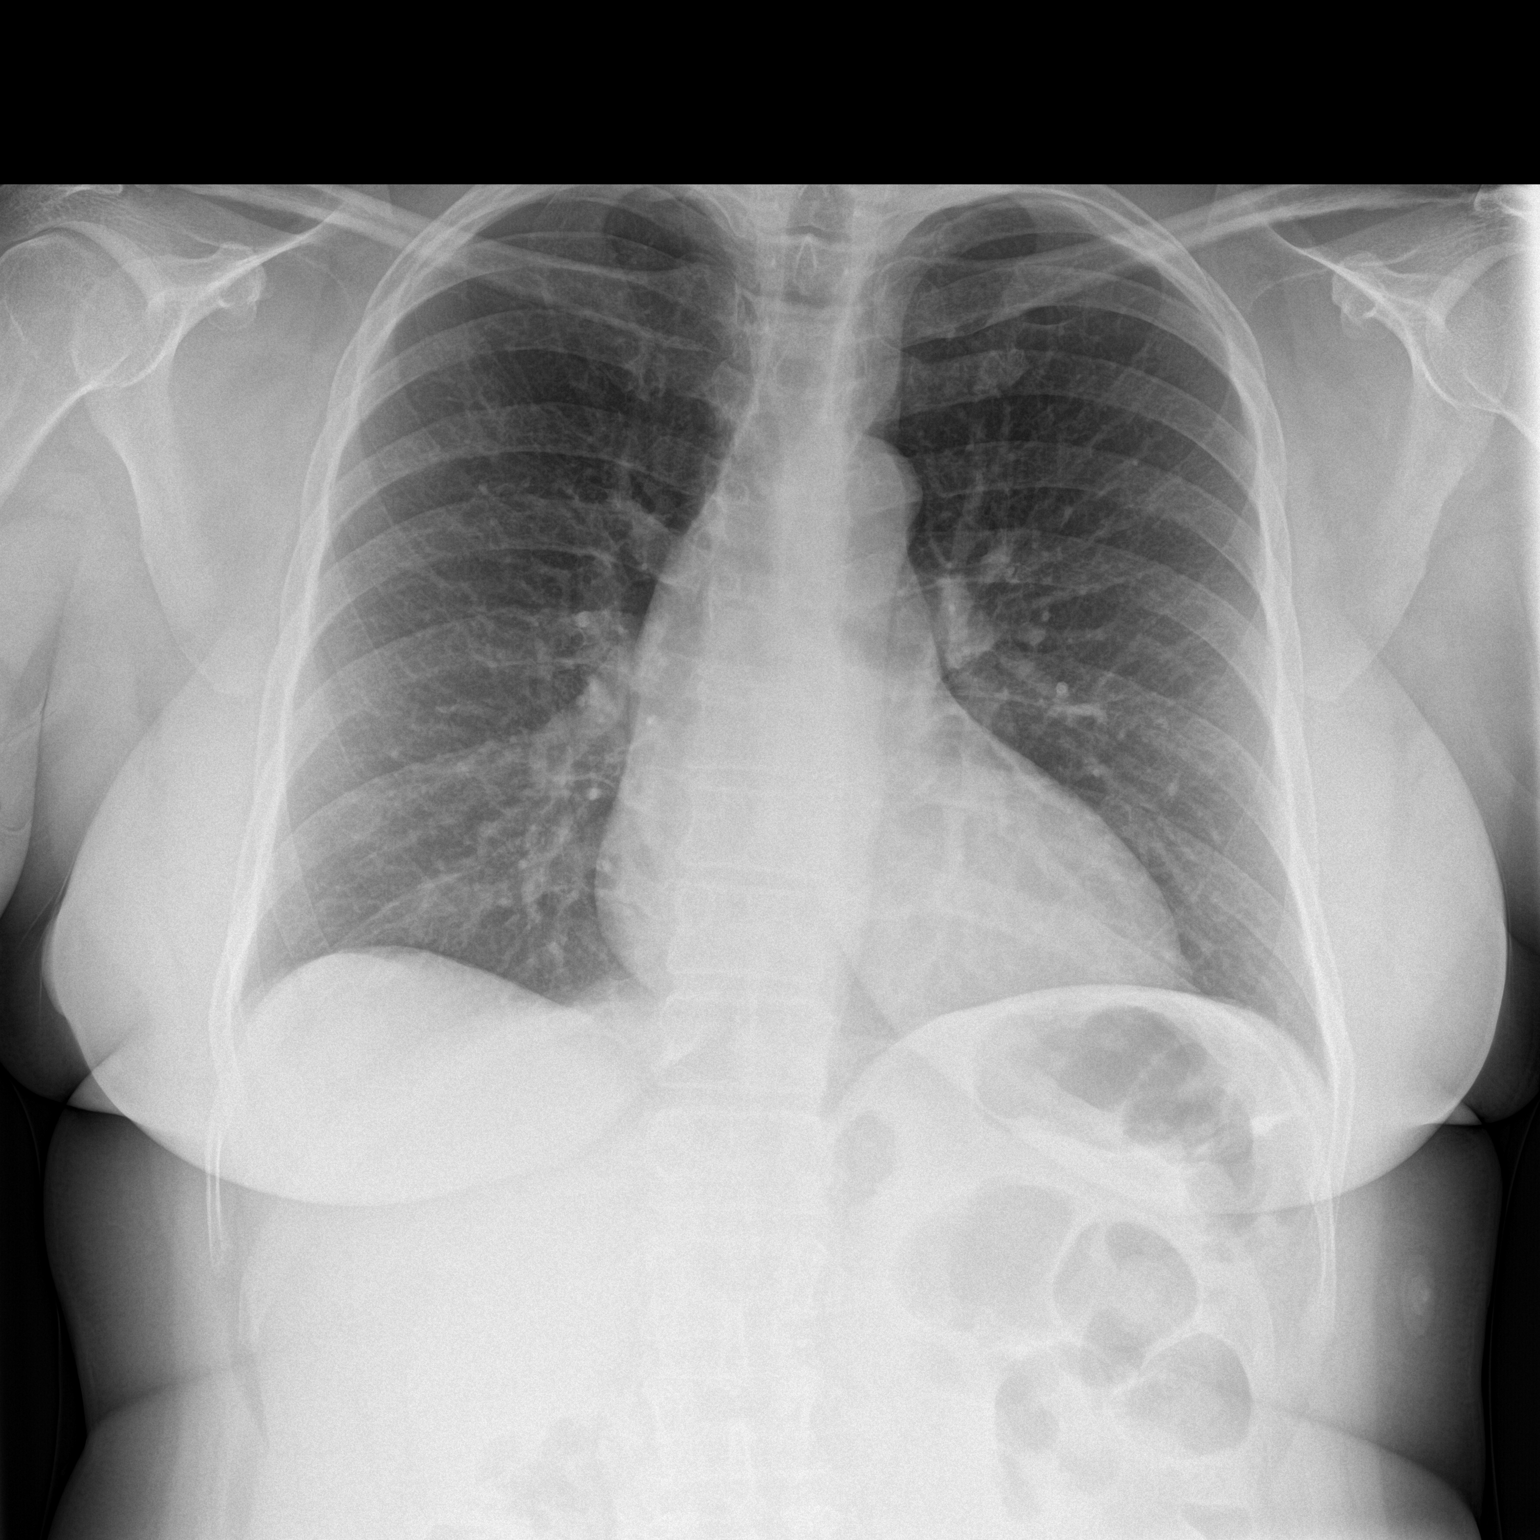

[chest lat]
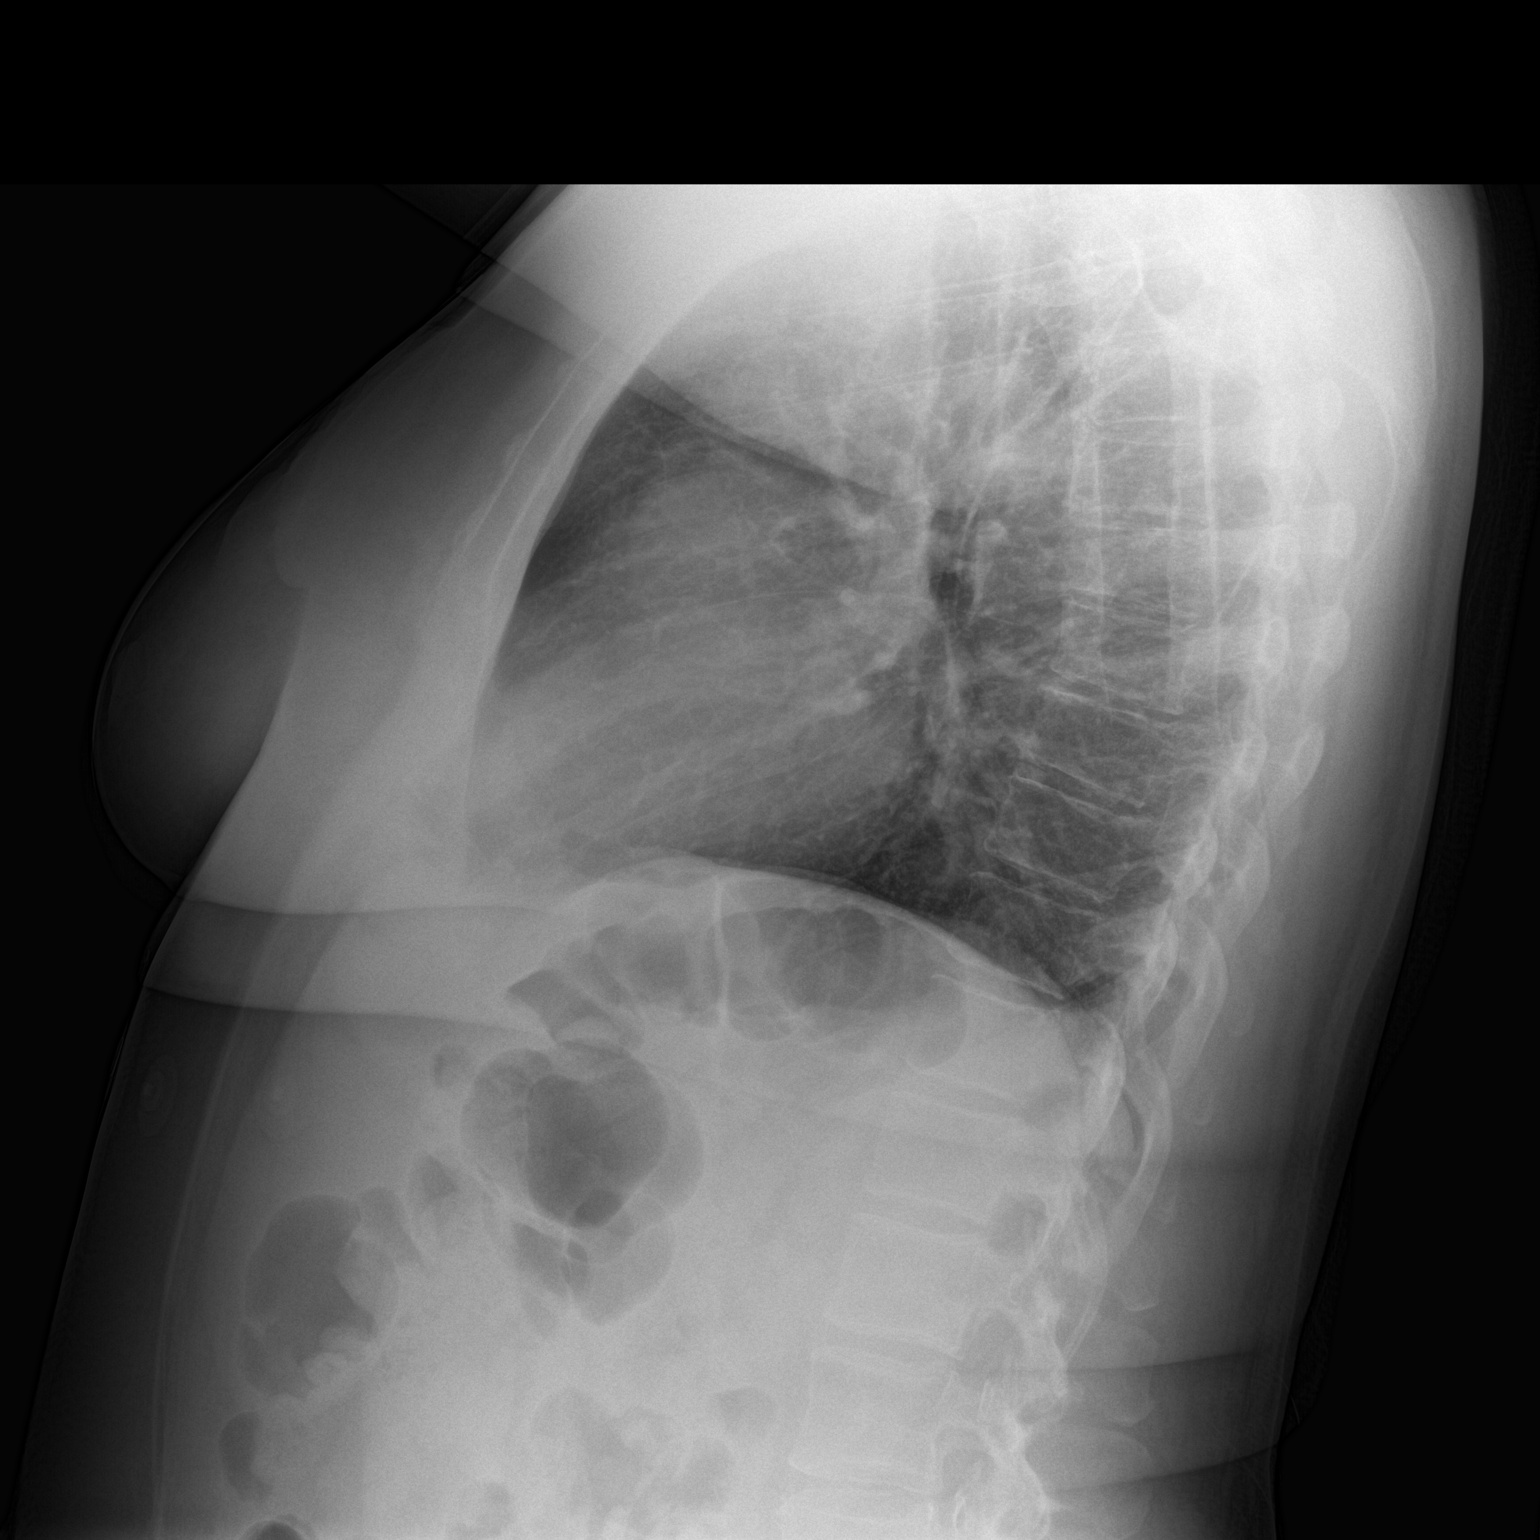

[2 of 2 positions shown; findings below may reference images not displayed]

FINDINGS: The lungs are clear. Heart size is normal. No pneumothorax or
pleural effusion.
IMPRESSION: Negative chest.

## 2014-10-28 NOTE — ED Notes (Signed)
Pt reports exertional sob for 24 hours. And at rest she has chest tightness. Also having nausea x 2 days.

## 2014-10-29 ENCOUNTER — Emergency Department (HOSPITAL_COMMUNITY)
Admission: EM | Admit: 2014-10-29 | Discharge: 2014-10-29 | Disposition: A | Discharge status: Home / Self Care | Payer: BLUE CROSS/BLUE SHIELD | Attending: Emergency Medicine | Admitting: Emergency Medicine

## 2014-10-29 DIAGNOSIS — R0600 Dyspnea, unspecified: Secondary | ICD-10-CM

## 2014-10-29 LAB — HEPATIC FUNCTION PANEL
ALT: 27 U/L (ref 0–35)
AST: 26 U/L (ref 0–37)
Albumin: 4.5 g/dL (ref 3.5–5.2)
Alkaline Phosphatase: 96 U/L (ref 39–117)
Total Bilirubin: 0.7 mg/dL (ref 0.3–1.2)
Total Protein: 7.7 g/dL (ref 6.0–8.3)

## 2014-10-29 NOTE — ED Provider Notes (Signed)
CSN: [id number]     Arrival date & time 10/28/14  2052 History  This chart was scribed for Linton Flemings, MD by Rayfield Citizen, ED Scribe. This patient was seen in room D32C/D32C and the patient's care was started at 3:04 AM.    Chief Complaint  Patient presents with  . Shortness of Breath   Patient is a 44 y.o. female presenting with shortness of breath. The history is provided by the patient. No language interpreter was used.  Shortness of Breath Associated symptoms: no cough and no sore throat      HPI Comments: [name] is a 44 y.o. female with past medical history of HTN who presents to the Emergency Department complaining of 2 days of intermittent episodes of SOB and chest tightness, lasting approx. 1 minute and exacerbated by exertion. She also reports 2 days of nausea. She denies cough and cold symptoms, denies lower extremity swelling, denies any recent long-term periods of immobility (e.g. no plane or car trips over 6-8 hours). She is not on a daily aspirin regimen.   Prior surgical history includes abdominal hysterectomy.   Past Medical History  Diagnosis Date  . Hypertension    Past Surgical History  Procedure Laterality Date  . Abdominal hysterectomy     Family History  Problem Relation Age of Onset  . Hypertension Other    History  Substance Use Topics  . Smoking status: Never Smoker   . Smokeless tobacco: Never Used  . Alcohol Use: Yes     Comment: once every other month   OB History    No data available     Review of Systems  HENT: Negative for congestion, rhinorrhea and sore throat.   Respiratory: Positive for chest tightness and shortness of breath. Negative for cough.   Cardiovascular: Negative for leg swelling.  Gastrointestinal: Positive for nausea.  All other systems reviewed and are negative.   Allergies  Tylox  Home Medications   Prior to Admission medications   Medication Sig Start Date End Date Taking? Authorizing Provider   Olmesartan-Amlodipine-HCTZ (TRIBENZOR) 40-5-25 MG TABS Take 1 tablet by mouth 1 day or 1 dose. 09/27/14  Yes Timoteo Gaul, FNP  simvastatin (ZOCOR) 10 MG tablet Take 1 tablet (10 mg total) by mouth at bedtime. 09/23/14  Yes Timoteo Gaul, FNP  Fluocinolone Acetonide (DERMA-SMOOTHE/FS SCALP) 0.01 % OIL Apply 1 application topically daily. 09/27/14   Timoteo Gaul, FNP   BP 145/98 mmHg  Pulse 82  Temp(Src) 98.2 F (36.8 C) (Oral)  Resp 18  Ht 6' (1.829 m)  Wt 245 lb (111.131 kg)  BMI 33.22 kg/m2  SpO2 96% Physical Exam  Constitutional: She is oriented to person, place, and time. She appears well-developed and well-nourished. No distress.  HENT:  Head: Normocephalic and atraumatic.  Mouth/Throat: Oropharynx is clear and moist. No oropharyngeal exudate.  Moist mucous membranes  Eyes: EOM are normal. Pupils are equal, round, and reactive to light.  Neck: Normal range of motion. Neck supple. No JVD present.  Cardiovascular: Normal rate, regular rhythm and normal heart sounds.  Exam reveals no gallop and no friction rub.   No murmur heard. Pulmonary/Chest: Effort normal and breath sounds normal. No respiratory distress. She has no wheezes. She has no rales.  Abdominal: Soft. Bowel sounds are normal. She exhibits no mass. There is tenderness (RUQ). There is no rebound and no guarding.  Musculoskeletal: Normal range of motion. She exhibits no edema.  Moves all extremities normally.  Lymphadenopathy:    She has no cervical adenopathy.  Neurological: She is alert and oriented to person, place, and time. She displays normal reflexes.  Skin: Skin is warm and dry. No rash noted.  Psychiatric: She has a normal mood and affect. Her behavior is normal.  Nursing note and vitals reviewed.   ED Course  Procedures   DIAGNOSTIC STUDIES: Oxygen Saturation is 96% on RA, adequate by my interpretation.    COORDINATION OF CARE: 3:05 AM Discussed treatment plan with pt at bedside and pt  agreed to plan.   Labs Review Labs Reviewed  BASIC METABOLIC PANEL - Abnormal; Notable for the following:    Glucose, Bld 107 (*)    GFR calc non Af Amer 80 (*)    All other components within normal limits  CBC  BRAIN NATRIURETIC PEPTIDE  HEPATIC FUNCTION PANEL  Randolm Idol, ED    Imaging Review Dg Chest 2 View  10/28/2014   CLINICAL DATA:  Chest pain and tightness for 2 days. Shortness of breath.  EXAM: CHEST  2 VIEW  COMPARISON:  PA and lateral chest 02/12/2009.  FINDINGS: The lungs are clear. Heart size is normal. No pneumothorax or pleural effusion.  IMPRESSION: Negative chest.   Electronically Signed   By: Inge Rise M.D.   On: 10/28/2014 21:36     EKG Interpretation   Date/Time:  Tuesday October 29 2014 03:13:53 EDT Ventricular Rate:  65 PR Interval:  145 QRS Duration: 92 QT Interval:  450 QTC Calculation: 468 R Axis:   15 Text Interpretation:  Sinus rhythm Abnormal R-wave progression, early  transition LVH by voltage No significant change since last tracing  Confirmed by Dalyn Becker  MD, Laurent Cargile ([id number]) on 10/29/2014 3:41:44 AM      MDM   Final diagnoses:  Dyspnea   44 year old female with shortness of breath with exertion for the last 24 hours and occasional nausea.  Her physical exam is unremarkable EKG and chest x-ray normal.  She does have some mild right upper quadrant tenderness with palpation.  Will add on hepatic function panel.  Patient is stable for discharge.  If this is negative, to follow with primary care doctor.  I personally performed the services described in this documentation, which was scribed in my presence. The recorded information has been reviewed and is accurate.       Linton Flemings, MD 10/30/14 [phone]

## 2014-10-29 NOTE — ED Notes (Signed)
MD at bedside. 

## 2014-10-29 NOTE — Discharge Instructions (Signed)
Your workup today has not shown a specific cause for your shortness of breath.  No signs of lung infection, scarring, mass or tumor were seen on your chest x-ray.  Your EKG and lab work was normal.  Follow-up with your primary care Dr. for further workup and possible referral to specialists if your symptoms continue.    Shortness of Breath Shortness of breath means you have trouble breathing. It could also mean that you have a medical problem. You should get immediate medical care for shortness of breath. CAUSES   Not enough oxygen in the air such as with high altitudes or a smoke-filled room.  Certain lung diseases, infections, or problems.  Heart disease or conditions, such as angina or heart failure.  Low red blood cells (anemia).  Poor physical fitness, which can cause shortness of breath when you exercise.  Chest or back injuries or stiffness.  Being overweight.  Smoking.  Anxiety, which can make you feel like you are not getting enough air. DIAGNOSIS  Serious medical problems can often be found during your physical exam. Tests may also be done to determine why you are having shortness of breath. Tests may include:  Chest X-rays.  Lung function tests.  Blood tests.  An electrocardiogram (ECG).  An ambulatory electrocardiogram. An ambulatory ECG records your heartbeat patterns over a 24-hour period.  Exercise testing.  A transthoracic echocardiogram (TTE). During echocardiography, sound waves are used to evaluate how blood flows through your heart.  A transesophageal echocardiogram (TEE).  Imaging scans. Your health care provider may not be able to find a cause for your shortness of breath after your exam. In this case, it is important to have a follow-up exam with your health care provider as directed.  TREATMENT  Treatment for shortness of breath depends on the cause of your symptoms and can vary greatly. HOME CARE INSTRUCTIONS   Do not smoke. Smoking is a  common cause of shortness of breath. If you smoke, ask for help to quit.  Avoid being around chemicals or things that may bother your breathing, such as paint fumes and dust.  Rest as needed. Slowly resume your usual activities.  If medicines were prescribed, take them as directed for the full length of time directed. This includes oxygen and any inhaled medicines.  Keep all follow-up appointments as directed by your health care provider. SEEK MEDICAL CARE IF:   Your condition does not improve in the time expected.  You have a hard time doing your normal activities even with rest.  You have any new symptoms. SEEK IMMEDIATE MEDICAL CARE IF:   Your shortness of breath gets worse.  You feel light-headed, faint, or develop a cough not controlled with medicines.  You start coughing up blood.  You have pain with breathing.  You have chest pain or pain in your arms, shoulders, or abdomen.  You have a fever.  You are unable to walk up stairs or exercise the way you normally do. MAKE SURE YOU:  Understand these instructions.  Will watch your condition.  Will get help right away if you are not doing well or get worse. Document Released: 04/27/2001 Document Revised: 08/07/2013 Document Reviewed: 10/18/2011 [facility] Patient Information 2015 [place], [place]. This information is not intended to replace advice given to you by your health care provider. Make sure you discuss any questions you have with your health care provider.

## 2015-01-05 ENCOUNTER — Emergency Department (HOSPITAL_COMMUNITY): Payer: BLUE CROSS/BLUE SHIELD

## 2015-01-05 ENCOUNTER — Emergency Department (HOSPITAL_COMMUNITY)
Admission: EM | Admit: 2015-01-05 | Discharge: 2015-01-05 | Disposition: A | Discharge status: Home / Self Care | Payer: BLUE CROSS/BLUE SHIELD | Attending: Emergency Medicine | Admitting: Emergency Medicine

## 2015-01-05 ENCOUNTER — Encounter (HOSPITAL_COMMUNITY): Payer: Self-pay | Admitting: Emergency Medicine

## 2015-01-05 ENCOUNTER — Emergency Department (INDEPENDENT_AMBULATORY_CARE_PROVIDER_SITE_OTHER)
Admission: EM | Admit: 2015-01-05 | Discharge: 2015-01-05 | Disposition: A | Discharge status: Nursing Home (Includes ICF) | Payer: BLUE CROSS/BLUE SHIELD | Source: Home / Self Care | Attending: Family Medicine | Admitting: Family Medicine

## 2015-01-05 ENCOUNTER — Emergency Department (HOSPITAL_BASED_OUTPATIENT_CLINIC_OR_DEPARTMENT_OTHER)
Admit: 2015-01-05 | Discharge: 2015-01-05 | Disposition: A | Discharge status: Home / Self Care | Payer: BLUE CROSS/BLUE SHIELD | Attending: Emergency Medicine | Admitting: Emergency Medicine

## 2015-01-05 DIAGNOSIS — Z7952 Long term (current) use of systemic steroids: Secondary | ICD-10-CM | POA: Insufficient documentation

## 2015-01-05 DIAGNOSIS — M79609 Pain in unspecified limb: Secondary | ICD-10-CM

## 2015-01-05 DIAGNOSIS — R0789 Other chest pain: Secondary | ICD-10-CM | POA: Diagnosis not present

## 2015-01-05 DIAGNOSIS — R079 Chest pain, unspecified: Secondary | ICD-10-CM

## 2015-01-05 DIAGNOSIS — M7989 Other specified soft tissue disorders: Secondary | ICD-10-CM | POA: Diagnosis not present

## 2015-01-05 DIAGNOSIS — I10 Essential (primary) hypertension: Secondary | ICD-10-CM | POA: Insufficient documentation

## 2015-01-05 LAB — BASIC METABOLIC PANEL
Anion gap: 9 (ref 5–15)
BUN: 17 mg/dL (ref 6–20)
CO2: 26 mmol/L (ref 22–32)
Calcium: 9.6 mg/dL (ref 8.9–10.3)
Chloride: 102 mmol/L (ref 101–111)
Creatinine, Ser: 0.82 mg/dL (ref 0.44–1.00)
Glucose, Bld: 169 mg/dL — ABNORMAL HIGH (ref 65–99)
Potassium: 3.3 mmol/L — ABNORMAL LOW (ref 3.5–5.1)
Sodium: 137 mmol/L (ref 135–145)

## 2015-01-05 LAB — CBC
HCT: 41.9 % (ref 36.0–46.0)
Hemoglobin: 14.3 g/dL (ref 12.0–15.0)
MCH: 30.2 pg (ref 26.0–34.0)
MCHC: 34.1 g/dL (ref 30.0–36.0)
MCV: 88.4 fL (ref 78.0–100.0)
PLATELETS: 268 10*3/uL (ref 150–400)
RBC: 4.74 MIL/uL (ref 3.87–5.11)
RDW: 13 % (ref 11.5–15.5)
WBC: 8.3 10*3/uL (ref 4.0–10.5)

## 2015-01-05 LAB — BRAIN NATRIURETIC PEPTIDE: B Natriuretic Peptide: 5.6 pg/mL (ref 0.0–100.0)

## 2015-01-05 LAB — I-STAT TROPONIN, ED: TROPONIN I, POC: 0 ng/mL (ref 0.00–0.08)

## 2015-01-05 IMAGING — DX DG CHEST 2V
2 series · 2 of 2 positions shown · non-contrast
Comparison: [DATE]

CLINICAL DATA: Chest pain

EXAM:
CHEST  2 VIEW

[chest pa]
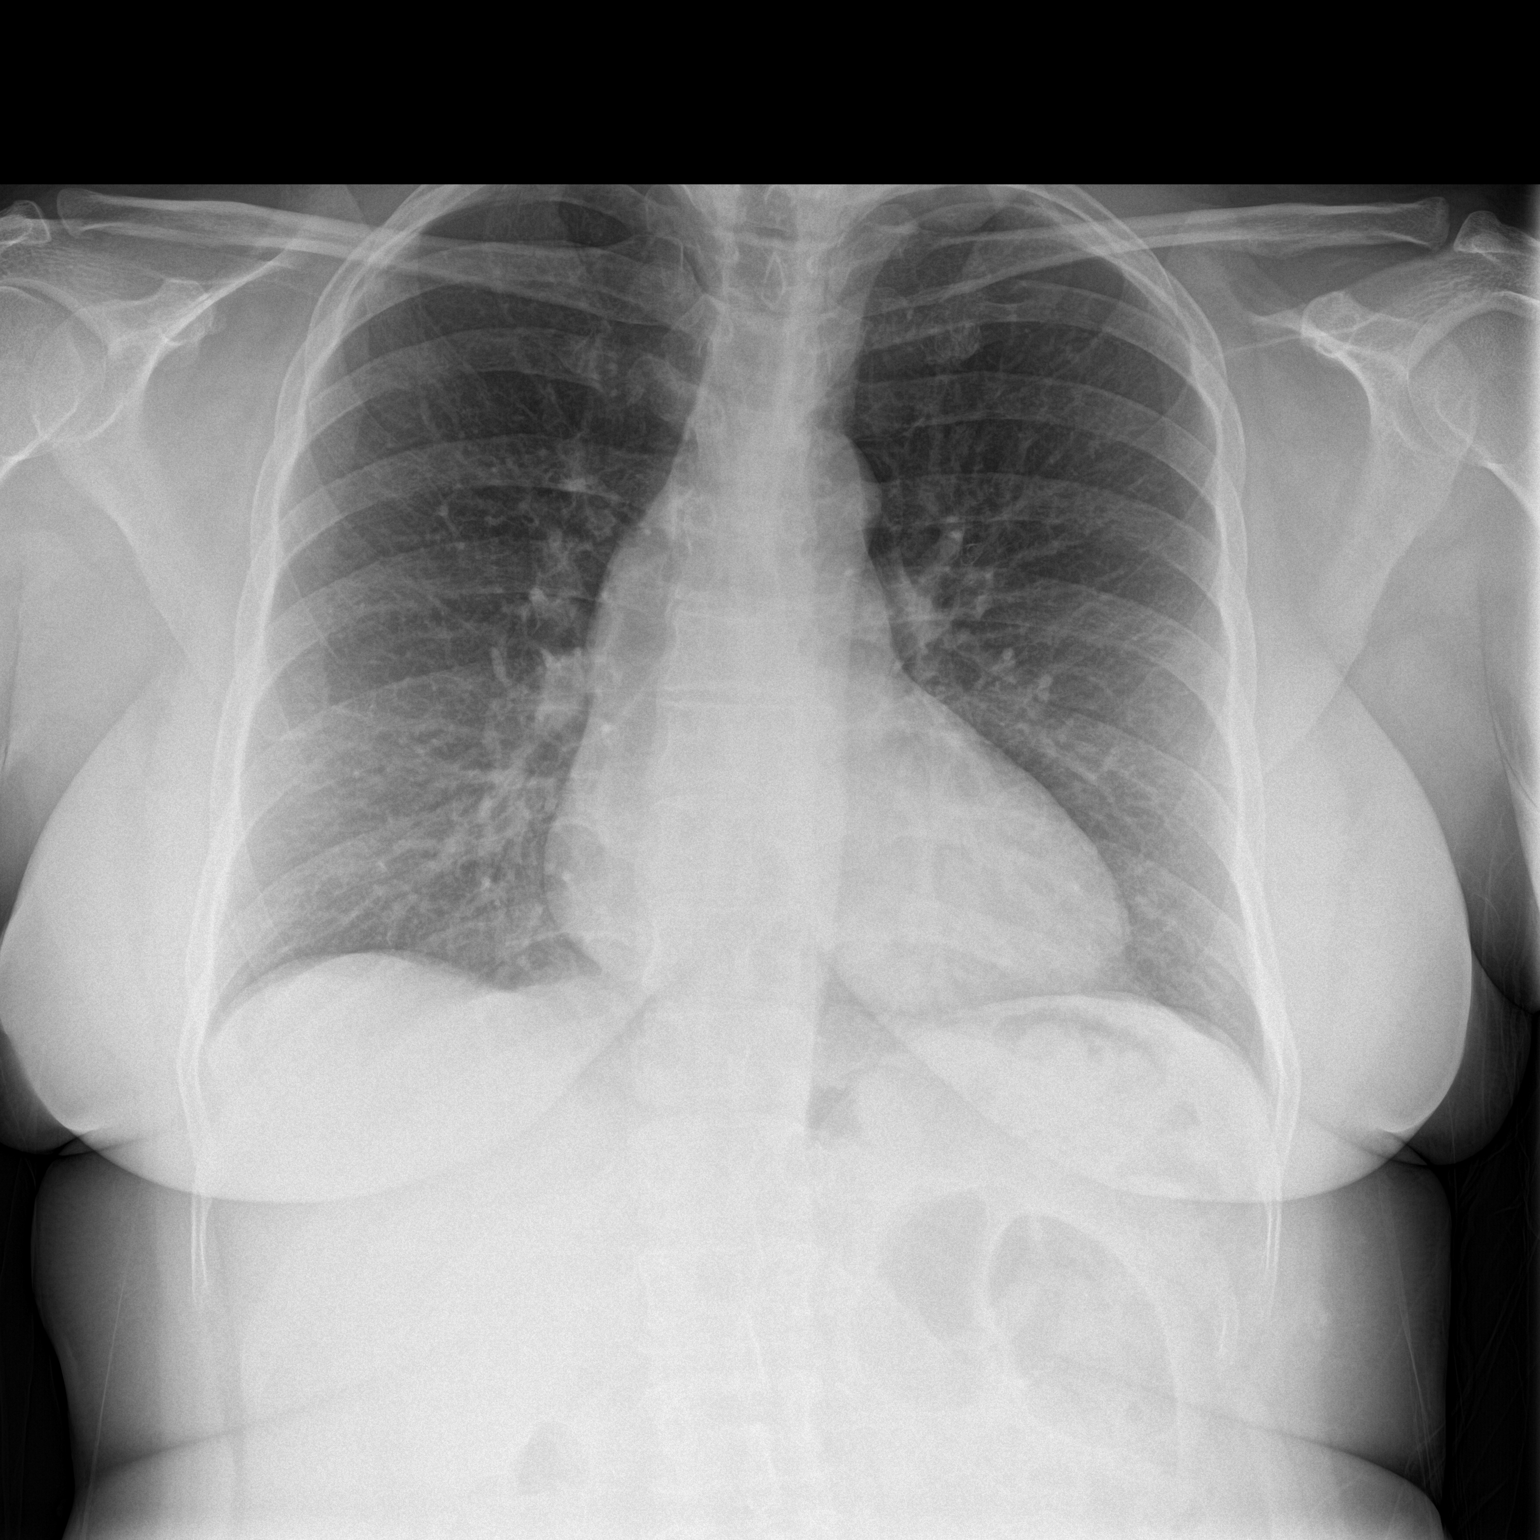

[chest lat]
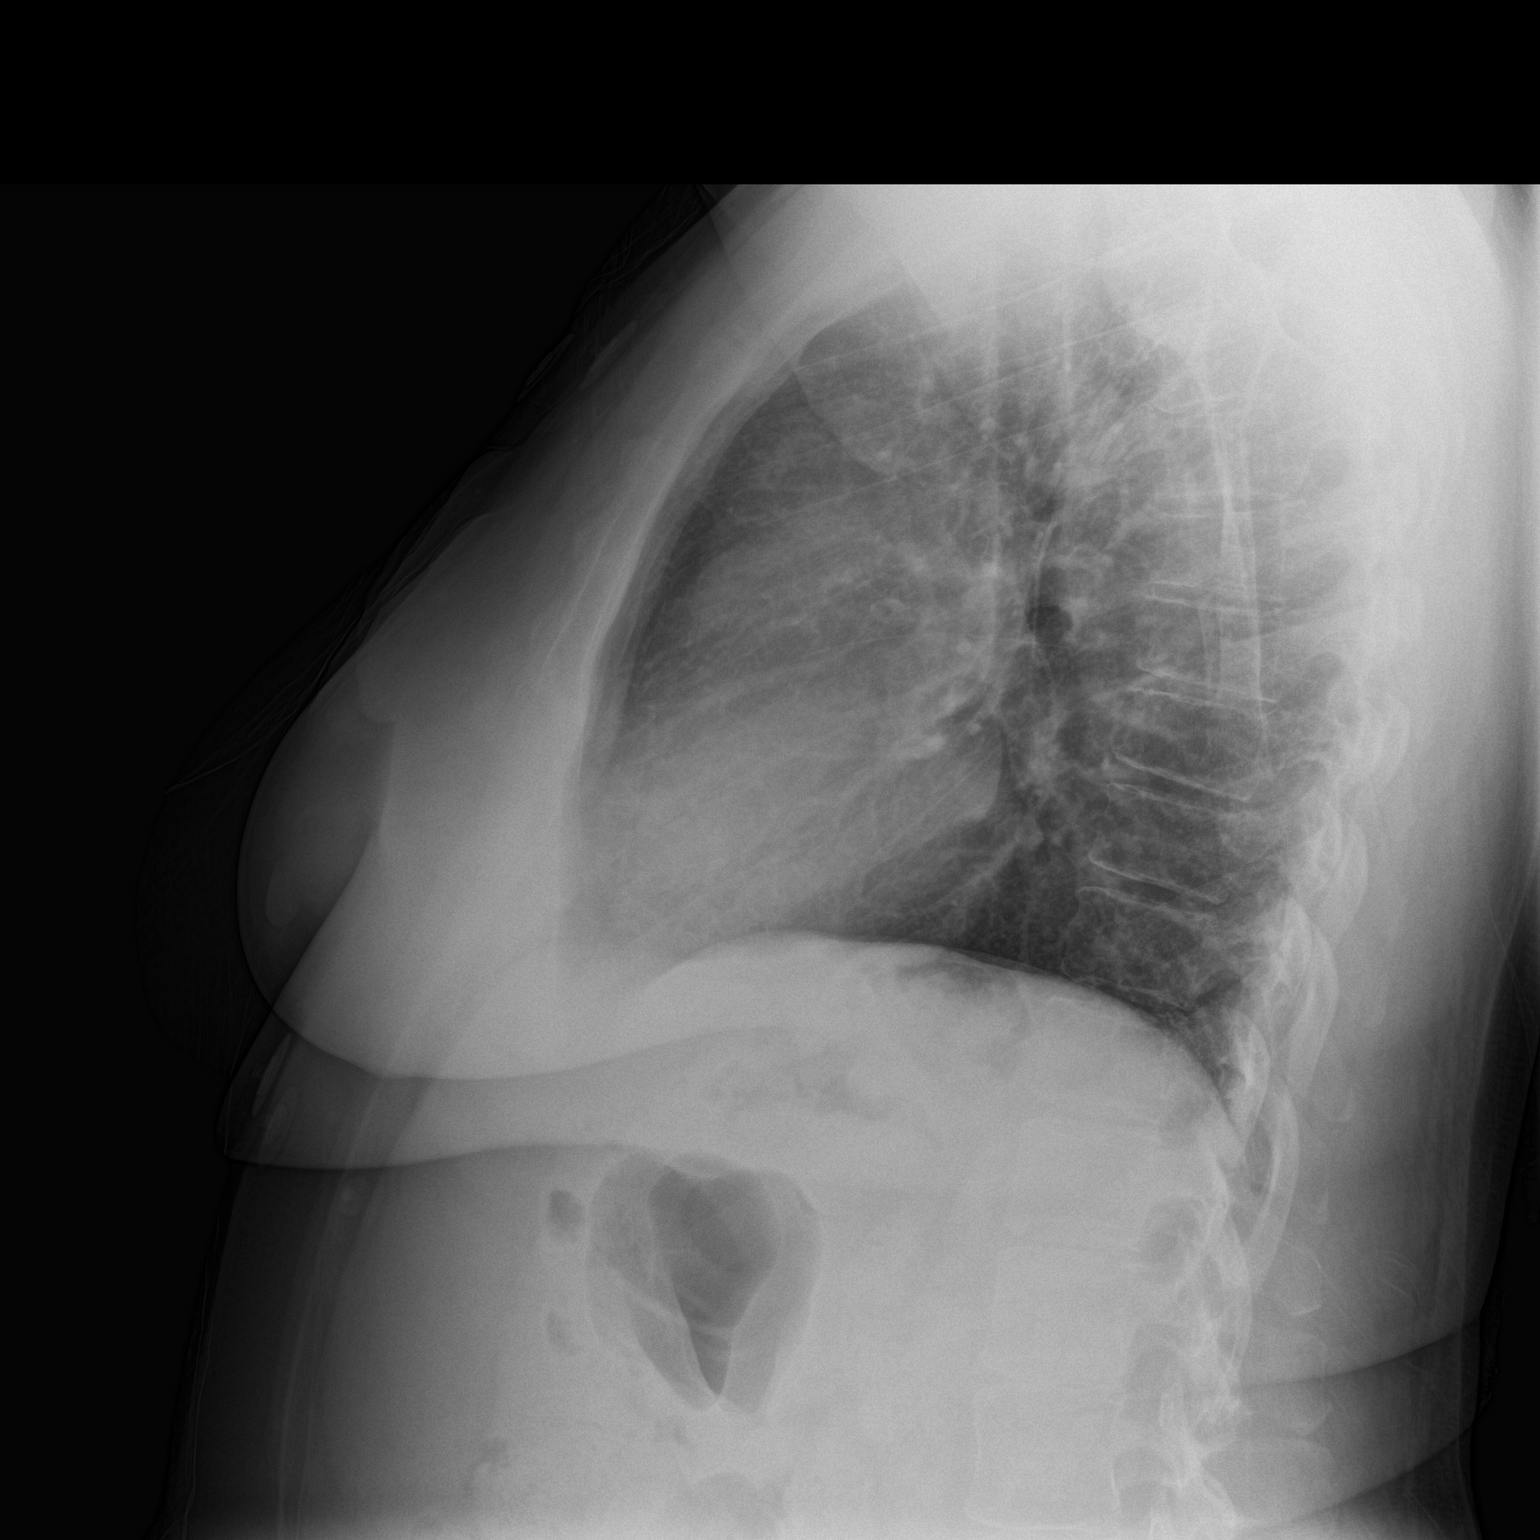

[2 of 2 positions shown; findings below may reference images not displayed]

FINDINGS: The heart size and mediastinal contours are within normal limits.
Both lungs are clear. The visualized skeletal structures are
unremarkable.
IMPRESSION: No active cardiopulmonary disease.

## 2015-01-05 MED ORDER — POTASSIUM CHLORIDE CRYS ER 20 MEQ PO TBCR
40.0000 meq | EXTENDED_RELEASE_TABLET | Freq: Once | ORAL | Status: AC
  Administered 2015-01-05: 40 meq via ORAL
  Filled 2015-01-05: qty 2

## 2015-01-05 NOTE — Discharge Instructions (Signed)
It was our pleasure to provide your ER care today - we hope that you feel better.  Your vascular ultrasound was read as normal, no clots were seen.  Your lab work was normal, except for a mild low potassium level - eat plenty of fruits and vegetables, and follow up with primary care doctor for recheck in the next couple weeks.   For chest discomfort, follow up with cardiologist in the next 1-2 weeks - see referral - call office to arrange appointment.  For swelling, limit salt intake, elevate legs as needed. Follow up with primary care doctor.  Return to ER if worse, new symptoms, trouble breathing, persistent chest pain, fevers, leg redness/increased swelling, other concern.     Chest Pain (Nonspecific) It is often hard to give a diagnosis for the cause of chest pain. There is always a chance that your pain could be related to something serious, such as a heart attack or a blood clot in the lungs. You need to follow up with your doctor. HOME CARE  If antibiotic medicine was given, take it as directed by your doctor. Finish the medicine even if you start to feel better.  For the next few days, avoid activities that bring on chest pain. Continue physical activities as told by your doctor.  Do not use any tobacco products. This includes cigarettes, chewing tobacco, and e-cigarettes.  Avoid drinking alcohol.  Only take medicine as told by your doctor.  Follow your doctor's suggestions for more testing if your chest pain does not go away.  Keep all doctor visits you made. GET HELP IF:  Your chest pain does not go away, even after treatment.  You have a rash with blisters on your chest.  You have a fever. GET HELP RIGHT AWAY IF:   You have more pain or pain that spreads to your arm, neck, jaw, back, or belly (abdomen).  You have shortness of breath.  You cough more than usual or cough up blood.  You have very bad back or belly pain.  You feel sick to your stomach  (nauseous) or throw up (vomit).  You have very bad weakness.  You pass out (faint).  You have chills. This is an emergency. Do not wait to see if the problems will go away. Call your local emergency services (911 in [place] Do not drive yourself to the hospital. MAKE SURE YOU:   Understand these instructions.  Will watch your condition.  Will get help right away if you are not doing well or get worse. Document Released: 01/19/2008 Document Revised: 08/07/2013 Document Reviewed: 01/19/2008 [facility] Patient Information 2015 [place], [place]. This information is not intended to replace advice given to you by your health care provider. Make sure you discuss any questions you have with your health care provider.     Peripheral Edema You have swelling in your legs (peripheral edema). This swelling is due to excess accumulation of salt and water in your body. Edema may be a sign of heart, kidney or liver disease, or a side effect of a medication. It may also be due to problems in the leg veins. Elevating your legs and using special support stockings may be very helpful, if the cause of the swelling is due to poor venous circulation. Avoid long periods of standing, whatever the cause. Treatment of edema depends on identifying the cause. Chips, pretzels, pickles and other salty foods should be avoided. Restricting salt in your diet is almost always needed. Water pills (diuretics) are  often used to remove the excess salt and water from your body via urine. These medicines prevent the kidney from reabsorbing sodium. This increases urine flow. Diuretic treatment may also result in lowering of potassium levels in your body. Potassium supplements may be needed if you have to use diuretics daily. Daily weights can help you keep track of your progress in clearing your edema. You should call your caregiver for follow up care as recommended. SEEK IMMEDIATE MEDICAL CARE IF:   You have increased swelling, pain,  redness, or heat in your legs.  You develop shortness of breath, especially when lying down.  You develop chest or abdominal pain, weakness, or fainting.  You have a fever. Document Released: 09/09/2004 Document Revised: 10/25/2011 Document Reviewed: 08/20/2009 [facility] Patient Information 2015 [place], [place]. This information is not intended to replace advice given to you by your health care provider. Make sure you discuss any questions you have with your health care provider.    Hypokalemia Hypokalemia means that the amount of potassium in the blood is lower than normal.Potassium is a chemical, called an electrolyte, that helps regulate the amount of fluid in the body. It also stimulates muscle contraction and helps nerves function properly.Most of the body's potassium is inside of cells, and only a very small amount is in the blood. Because the amount in the blood is so small, minor changes can be life-threatening. CAUSES  Antibiotics.  Diarrhea or vomiting.  Using laxatives too much, which can cause diarrhea.  Chronic kidney disease.  Water pills (diuretics).  Eating disorders (bulimia).  Low magnesium level.  Sweating a lot. SIGNS AND SYMPTOMS  Weakness.  Constipation.  Fatigue.  Muscle cramps.  Mental confusion.  Skipped heartbeats or irregular heartbeat (palpitations).  Tingling or numbness. DIAGNOSIS  Your health care provider can diagnose hypokalemia with blood tests. In addition to checking your potassium level, your health care provider may also check other lab tests. TREATMENT Hypokalemia can be treated with potassium supplements taken by mouth or adjustments in your current medicines. If your potassium level is very low, you may need to get potassium through a vein (IV) and be monitored in the hospital. A diet high in potassium is also helpful. Foods high in potassium are:  Nuts, such as peanuts and pistachios.  Seeds, such as sunflower seeds and  pumpkin seeds.  Peas, lentils, and lima beans.  Whole grain and bran cereals and breads.  Fresh fruit and vegetables, such as apricots, avocado, bananas, cantaloupe, kiwi, oranges, tomatoes, asparagus, and potatoes.  Orange and tomato juices.  Red meats.  Fruit yogurt. HOME CARE INSTRUCTIONS  Take all medicines as prescribed by your health care provider.  Maintain a healthy diet by including nutritious food, such as fruits, vegetables, nuts, whole grains, and lean meats.  If you are taking a laxative, be sure to follow the directions on the label. SEEK MEDICAL CARE IF:  Your weakness gets worse.  You feel your heart pounding or racing.  You are vomiting or having diarrhea.  You are diabetic and having trouble keeping your blood glucose in the normal range. SEEK IMMEDIATE MEDICAL CARE IF:  You have chest pain, shortness of breath, or dizziness.  You are vomiting or having diarrhea for more than 2 days.  You faint. MAKE SURE YOU:   Understand these instructions.  Will watch your condition.  Will get help right away if you are not doing well or get worse. Document Released: 08/02/2005 Document Revised: 05/23/2013 Document Reviewed: 02/02/2013 [employer] Patient  Information 2015 [place], [place]. This information is not intended to replace advice given to you by your health care provider. Make sure you discuss any questions you have with your health care provider.

## 2015-01-05 NOTE — ED Provider Notes (Signed)
[name] is a 44 y.o. female who presents to Urgent Care today for chest pain. Patient notes intermittent left-sided chest pain for the last 2 months. The pain sometimes is worse with exertion and better with rest. The pain does not change with limb positioning. No radiating pain weakness or numbness. She notes today she noted some palpitations. She denies any significant shortness of breath. Additionally she notes leg swelling. Over the last few days she's had bilateral lower leg swelling. However the right leg has resolved but she continues to have swelling of the left lower leg. She denies any orthopnea.   Past Medical History  Diagnosis Date  . Hypertension    Past Surgical History  Procedure Laterality Date  . Abdominal hysterectomy     History  Substance Use Topics  . Smoking status: Never Smoker   . Smokeless tobacco: Never Used  . Alcohol Use: Yes     Comment: once every other month   ROS as above Medications: No current facility-administered medications for this encounter.   Current Outpatient Prescriptions  Medication Sig Dispense Refill  . Olmesartan-Amlodipine-HCTZ (TRIBENZOR) 40-5-25 MG TABS Take 1 tablet by mouth 1 day or 1 dose. 90 tablet 1  . simvastatin (ZOCOR) 10 MG tablet Take 1 tablet (10 mg total) by mouth at bedtime. 30 tablet 3  . Fluocinolone Acetonide (DERMA-SMOOTHE/FS SCALP) 0.01 % OIL Apply 1 application topically daily. 1 Bottle 1   Allergies  Allergen Reactions  . Tylox [Oxycodone-Acetaminophen]     Caused face to swell     Exam:  BP 128/84 mmHg  Pulse 98  Temp(Src) 99.1 F (37.3 C) (Oral)  Resp 16  SpO2 98% Gen: Well NAD HEENT: EOMI,  MMM Lungs: Normal work of breathing. CTABL Heart: RRR no loud systolic murmur present does not radiate to the neck. Abd: NABS, Soft. Nondistended, Nontender Exts: Brisk capillary refill, warm and well perfused.  Left lower extremity Swelling compared to right. Left foot is tender to touch. No palpable  Cords in either lower extremities  ED ECG REPORT   Date: 01/05/2015  Rate: 88  Rhythm: normal sinus rhythm  QRS Axis: normal  Intervals: QT prolonged  ST/T Wave abnormalities: indeterminate  Conduction Disutrbances:none  Narrative Interpretation: LVH, Left atrial enlargment  Old EKG Reviewed: changes noted  I have personally reviewed the EKG tracing and agree with the computerized printout as noted.   No results found for this or any previous visit (from the past 24 hour(s)). No results found.  Assessment and Plan: 44 y.o. female with chest pain, leg swelling, and new heart murmur. Symptoms concerning for heart failure versus DVT/PE. Transfer to emergency room for further evaluation and management.  Discussed warning signs or symptoms. Please see discharge instructions. Patient expresses understanding.     Gregor Hams, MD 01/05/15 [phone]

## 2015-01-05 NOTE — ED Notes (Signed)
Venus doppler at bedside

## 2015-01-05 NOTE — ED Notes (Signed)
Pt c/o generalized chest pain x 5 days, pt also c/o pain and swelling to B/L feet. Pt soaked in epsom salt that relieved swelling to right foot but did nothing for left foot.

## 2015-01-05 NOTE — Progress Notes (Signed)
*  Preliminary Results* Left lower extremity venous duplex completed. Visualized veins of the left lower extremity are negative for deep vein thrombosis. There is no evidence of left Baker's cyst.  01/05/2015 6:22 PM  Maudry Mayhew, RVT, RDCS, RDMS

## 2015-01-05 NOTE — ED Provider Notes (Signed)
CSN: [id number]     Arrival date & time 01/05/15  1512 History   First MD Initiated Contact with Patient 01/05/15 1628     Chief Complaint  Patient presents with  . Chest Pain  . Foot Pain     (Consider location/radiation/quality/duration/timing/severity/associated sxs/prior Treatment) Patient is a 44 y.o. female presenting with chest pain and lower extremity pain. The history is provided by the patient.  Chest Pain Associated symptoms: no abdominal pain, no back pain, no cough, no fever, no headache, no shortness of breath and not vomiting   Foot Pain Associated symptoms include chest pain. Pertinent negatives include no abdominal pain, no headaches and no shortness of breath.  Patient with hx htn, c/o intermittent mid to left cp for the past several days, at rest. Episodes last approximately 30 seconds, never longer than 1 minute. Occur randomly, no consistent exacerbating or alleviating factors. Not pleuritic. No associated sob, nv or diaphoresis. No unusual doe. Non smoker. No drug use. Denies fam hx premature cad. Pt notes bilateral foot/ankle swelling in the past week. States right went down to normal, but left remained swollen. No injury. States compliant w normal meds. No sob or orthopnea. Pt denies fever or chills. No recent immobility, trauma, or surgery. No hx dvt or pe.      Past Medical History  Diagnosis Date  . Hypertension    Past Surgical History  Procedure Laterality Date  . Abdominal hysterectomy     Family History  Problem Relation Age of Onset  . Hypertension Other    History  Substance Use Topics  . Smoking status: Never Smoker   . Smokeless tobacco: Never Used  . Alcohol Use: Yes     Comment: once every other month   OB History    No data available     Review of Systems  Constitutional: Negative for fever and chills.  HENT: Negative for sore throat.   Eyes: Negative for redness.  Respiratory: Negative for cough and shortness of breath.    Cardiovascular: Positive for chest pain.  Gastrointestinal: Negative for vomiting and abdominal pain.  Genitourinary: Negative for flank pain.  Musculoskeletal: Negative for back pain and neck pain.  Skin: Negative for rash.  Neurological: Negative for headaches.  Hematological: Does not bruise/bleed easily.  Psychiatric/Behavioral: Negative for confusion.      Allergies  Tylox  Home Medications   Prior to Admission medications   Medication Sig Start Date End Date Taking? Authorizing Provider  Fluocinolone Acetonide (DERMA-SMOOTHE/FS SCALP) 0.01 % OIL Apply 1 application topically daily. 09/27/14   Kennyth Arnold, FNP  Olmesartan-Amlodipine-HCTZ (TRIBENZOR) 40-5-25 MG TABS Take 1 tablet by mouth 1 day or 1 dose. 09/27/14   Kennyth Arnold, FNP  simvastatin (ZOCOR) 10 MG tablet Take 1 tablet (10 mg total) by mouth at bedtime. 09/23/14   Kennyth Arnold, FNP   BP 132/78 mmHg  Pulse 89  Temp(Src) 98.6 F (37 C) (Oral)  Resp 18  Ht 6\' 1"  (1.854 m)  Wt 246 lb 4 oz (111.698 kg)  BMI 32.50 kg/m2  SpO2 97% Physical Exam  Constitutional: She appears well-developed and well-nourished. No distress.  HENT:  Mouth/Throat: Oropharynx is clear and moist.  Eyes: Conjunctivae are normal. No scleral icterus.  Neck: Neck supple. No tracheal deviation present.  Cardiovascular: Normal rate, regular rhythm, normal heart sounds and intact distal pulses.  Exam reveals no gallop and no friction rub.   No murmur heard. Pulmonary/Chest: Effort normal and breath sounds normal. No  respiratory distress. She exhibits no tenderness.  Abdominal: Soft. Normal appearance and bowel sounds are normal. She exhibits no distension. There is no tenderness.  Musculoskeletal: She exhibits edema. She exhibits no tenderness.  Mild bil foot and ankle edema, L > R. Distal pulses palp. No skin changes or cellulitis. No calf pain or tenderness.   Neurological: She is alert.  Skin: Skin is warm and dry. No rash noted. She is  not diaphoretic.  Psychiatric: She has a normal mood and affect.  Nursing note and vitals reviewed.   ED Course  Procedures (including critical care time) Labs Review  Results for orders placed or performed during the hospital encounter of 01/05/15  CBC  Result Value Ref Range   WBC 8.3 4.0 - 10.5 K/uL   RBC 4.74 3.87 - 5.11 MIL/uL   Hemoglobin 14.3 12.0 - 15.0 g/dL   HCT 41.9 36.0 - 46.0 %   MCV 88.4 78.0 - 100.0 fL   MCH 30.2 26.0 - 34.0 pg   MCHC 34.1 30.0 - 36.0 g/dL   RDW 13.0 11.5 - 15.5 %   Platelets 268 150 - 400 K/uL  Basic metabolic panel  Result Value Ref Range   Sodium 137 135 - 145 mmol/L   Potassium 3.3 (L) 3.5 - 5.1 mmol/L   Chloride 102 101 - 111 mmol/L   CO2 26 22 - 32 mmol/L   Glucose, Bld 169 (H) 65 - 99 mg/dL   BUN 17 6 - 20 mg/dL   Creatinine, Ser 0.82 0.44 - 1.00 mg/dL   Calcium 9.6 8.9 - 10.3 mg/dL   GFR calc non Af Amer >60 >60 mL/min   GFR calc Af Amer >60 >60 mL/min   Anion gap 9 5 - 15  BNP (order ONLY if patient complains of dyspnea/SOB AND you have documented it for THIS visit)  Result Value Ref Range   B Natriuretic Peptide 5.6 0.0 - 100.0 pg/mL  I-stat troponin, ED  (not at [facility])  Result Value Ref Range   Troponin i, poc 0.00 0.00 - 0.08 ng/mL   Comment 3           Dg Chest 2 View (if Patient Has Fever And/or Copd)  01/05/2015   CLINICAL DATA:  Chest pain  EXAM: CHEST  2 VIEW  COMPARISON:  10/28/2014  FINDINGS: The heart size and mediastinal contours are within normal limits. Both lungs are clear. The visualized skeletal structures are unremarkable.  IMPRESSION: No active cardiopulmonary disease.   Electronically Signed   By: Inez Catalina M.D.   On: 01/05/2015 16:13         EKG Interpretation   Date/Time:  Sunday Jan 05 2015 15:18:03 EDT Ventricular Rate:  94 PR Interval:  148 QRS Duration: 82 QT Interval:  386 QTC Calculation: 482 R Axis:   -8 Text Interpretation:  Normal sinus rhythm No significant change since last   tracing Confirmed by Ashok Cordia  MD, Lennette Bihari ([id number]) on 01/05/2015 4:20:04 PM      MDM   Iv ns. Labs. Cxr.  Reviewed nursing notes and prior charts for additional history.   kcl mildly low, kcl po.  After symptoms for several days, trop neg/0.  Vascular study  Neg for dvt.  Recheck pt symptom free, vitals normal. Pt currently appears stable for d/c.    Lajean Saver, MD 01/05/15 [phone]

## 2015-01-05 NOTE — ED Notes (Signed)
C/o left chest pain, intermittent.  Pain for 2 months, no change with position.  No nausea, no vomiting, no sob.  Seen for the same and told this possibly was anxiety

## 2015-01-12 NOTE — Progress Notes (Signed)
ERron

## 2015-01-14 ENCOUNTER — Encounter: Payer: BLUE CROSS/BLUE SHIELD | Admitting: Cardiovascular Disease

## 2015-01-24 ENCOUNTER — Ambulatory Visit: Payer: BLUE CROSS/BLUE SHIELD | Admitting: Cardiovascular Disease

## 2015-09-18 ENCOUNTER — Telehealth: Payer: Self-pay | Admitting: *Deleted

## 2015-09-18 NOTE — Telephone Encounter (Signed)
Called Dr. Neita Goodnight office to let them know patient is scheduled to see Dr. Sondra Come 09/24/15.  Waiting for patient to call and confirm

## 2015-09-19 NOTE — Progress Notes (Signed)
Histology and Location: keloid on her neck - 3 separate areas.   [name] presented with the following signs/symptoms: has keloids on multiple areas of her body.  She reports they just appear.  She had cryotherapy 3 years ago in [place], Dr. Ihor Gully  She feels like it is pressing on her windpipe sometimes.    Past/Anticipated interventions by patient's surgeon/dermatologist for current problematic lesion, if any: has occasional sharp pains.  She rates her pain today at a 3/10.  SAFETY ISSUES:  Prior radiation? no  Pacemaker/ICD? no  Possible current pregnancy? no  Is the patient on methotrexate? no  Current Complaints / other details:  Surgery is planned for 10/20/15 by Dr. Towanda Malkin.  BP 168/111 mmHg  Pulse 88  Temp(Src) 98.3 F (36.8 C) (Oral)  Resp 16  Ht 6' (1.829 m)  Wt 250 lb 8 oz (113.626 kg)  BMI 33.97 kg/m2

## 2015-09-24 ENCOUNTER — Ambulatory Visit: Payer: BLUE CROSS/BLUE SHIELD

## 2015-09-24 ENCOUNTER — Ambulatory Visit: Payer: BLUE CROSS/BLUE SHIELD | Admitting: Radiation Oncology

## 2015-09-25 ENCOUNTER — Ambulatory Visit
Admission: RE | Admit: 2015-09-25 | Discharge: 2015-09-25 | Disposition: A | Discharge status: Home / Self Care | Payer: BLUE CROSS/BLUE SHIELD | Source: Ambulatory Visit | Attending: Radiation Oncology | Admitting: Radiation Oncology

## 2015-09-25 ENCOUNTER — Encounter: Payer: Self-pay | Admitting: Radiation Oncology

## 2015-09-25 VITALS — BP 168/111 | HR 88 | Temp 98.3°F | Resp 16 | Ht 72.0 in | Wt 250.5 lb

## 2015-09-25 DIAGNOSIS — L910 Hypertrophic scar: Secondary | ICD-10-CM | POA: Insufficient documentation

## 2015-09-25 DIAGNOSIS — Z9889 Other specified postprocedural states: Secondary | ICD-10-CM | POA: Insufficient documentation

## 2015-09-25 DIAGNOSIS — Z510 Encounter for antineoplastic radiation therapy: Secondary | ICD-10-CM | POA: Diagnosis present

## 2015-09-25 DIAGNOSIS — E785 Hyperlipidemia, unspecified: Secondary | ICD-10-CM | POA: Diagnosis not present

## 2015-09-25 DIAGNOSIS — I10 Essential (primary) hypertension: Secondary | ICD-10-CM | POA: Insufficient documentation

## 2015-09-25 DIAGNOSIS — D489 Neoplasm of uncertain behavior, unspecified: Secondary | ICD-10-CM | POA: Insufficient documentation

## 2015-09-25 HISTORY — DX: Hypoglycemia, unspecified: E16.2

## 2015-09-25 HISTORY — DX: Neoplasm of uncertain behavior, unspecified: D48.9

## 2015-09-25 NOTE — Progress Notes (Signed)
Radiation Oncology         [phone]) [phone] ________________________________  Initial Outpatient Consultation  Name: [name] MRN: [id number]  Date: 09/25/2015  DOB: Jan 29, 1971  [id number] KOTVAN, MD  Cristine Polio, MD   REFERRING PHYSICIAN: Cristine Polio, MD  DIAGNOSIS: Hypertrophic scars involving the anterior neck region  HISTORY OF PRESENT ILLNESS::[name] is a 45 y.o. female who presents today with multiple keloids of her body. She reports they just appear without any associated trauma or surgery. She had cryotherapy 3 years ago in [place], Dr. Ihor Gully. She got an infection a few weeks after this therapy. She did not complete the planned course of 4 cryotherapy's in light of pain associated with this procedure.  She did not have surgery prior. She feels like it is pressing on her windpipe sometimes. She has occasional sharp pains and she rates her pain as a 3/10 today. She wears a scarf  year-round to cover the keloid scars along her anterior neck region.  PREVIOUS RADIATION THERAPY: No  PAST MEDICAL HISTORY:  has a past medical history of Hypertension; Hypoglycemia; and Teratoma.    PAST SURGICAL HISTORY: Past Surgical History  Procedure Laterality Date  . Abdominal hysterectomy    . Cesarean section    . Dermoid cyst  excision    . 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 Ectopic pregnancy surgery    . Cryotherapy  2013    to neck    FAMILY HISTORY: family history includes Hypertension in her other.  SOCIAL HISTORY:  reports that she has never smoked. She has never used smokeless tobacco. She reports that she drinks alcohol. She reports that she does not use illicit drugs.  ALLERGIES: Tylox  MEDICATIONS:  Current Outpatient Prescriptions  Medication Sig Dispense Refill  . aspirin EC 325 MG tablet Take 325 mg by mouth every 4 (four) hours as needed (pain).    . Chromium 200 MCG CAPS Take by mouth.    . co-enzyme Q-10 30 MG  capsule Take 30 mg by mouth 3 (three) times daily.    . Fluocinolone Acetonide (DERMA-SMOOTHE/FS SCALP) 0.01 % OIL Apply 1 application topically daily. (Patient taking differently: Apply 1 application topically daily as needed (dry scalp). ) 1 Bottle 1  . [place], Centella asiatica, (GOTU KOLA PO) Take 1 tablet by mouth daily.    . Misc Natural Products (TUMERSAID PO) Take by mouth.    . Olmesartan-Amlodipine-HCTZ (TRIBENZOR) 40-5-25 MG TABS Take 1 tablet by mouth 1 day or 1 dose. (Patient taking differently: Take 1 tablet by mouth daily with lunch. ) 90 tablet 1  . simvastatin (ZOCOR) 10 MG tablet Take 1 tablet (10 mg total) by mouth at bedtime. 30 tablet 3   No current facility-administered medications for this encounter.    REVIEW OF SYSTEMS:  A 15 point review of systems is documented in the electronic medical record. This was obtained by the nursing staff. However, I reviewed this with the patient to discuss relevant findings and make appropriate changes.  Pertinent items are noted in HPI.   PHYSICAL EXAM:  height is 6' (1.829 m) and weight is 250 lb 8 oz (113.626 kg). Her oral temperature is 98.3 F (36.8 C). Her blood pressure is 168/111 and her pulse is 88. Her respiration is 16.   General: Alert and oriented, in no acute distress Neck: Neck is supple, no palpable cervical or supraclavicular lymphadenopathy. Heart: Regular in rate and rhythm  with no murmurs, rubs, or gallops. Chest: Clear to auscultation bilaterally, with no rhonchi, wheezes, or rales. Psychiatric: Judgment and insight are intact. Affect is appropriate. Along anterior neck she has 3 large keloids. Largest is approximately 15 x 6 cm in size and 4 cm thick.  ECOG = 1  LABORATORY DATA:  Lab Results  Component Value Date   WBC 8.3 01/05/2015   HGB 14.3 01/05/2015   HCT 41.9 01/05/2015   MCV 88.4 01/05/2015   PLT 268 01/05/2015   NEUTROABS 4.3 09/20/2014   Lab Results  Component Value Date   NA 137 01/05/2015     K 3.3* 01/05/2015   CL 102 01/05/2015   CO2 26 01/05/2015   GLUCOSE 169* 01/05/2015   CREATININE 0.82 01/05/2015   CALCIUM 9.6 01/05/2015      RADIOGRAPHY: No results found.  IMPRESSION: [name] is a 45 yo female with a history of keloids, currently with 3 separate large areas on her anterior neck. These lesions are a significant cosmetic issue as well as causing pain/discomfort.  Patient would be a good candidate for surgery and post-operative radiation to reduce to the chances of recurrence.  PLAN: We discussed the possible side effects and toxicities of low-dose radiation. She appears to understand and agree to treatment. She has surgery scheduled 10/20/2015 at 8:30 am by Dr. Towanda Malkin. She will be scheduled for a planning appointment 10/20/2015 at 3 pm. She will start her treatments the next morning within 24 hours of her surgery. She will receive 3 treatments.      ------------------------------------------------  Blair Promise, PhD, MD    This document serves as a record of services personally performed by Gery Pray, MD. It was created on his behalf by Lendon Collar, a trained medical scribe. The creation of this record is based on the scribe's personal observations and the provider's statements to them. This document has been checked and approved by the attending provider.

## 2015-09-26 NOTE — Addendum Note (Signed)
Encounter addended by: Jacqulyn Liner, RN on: 09/26/2015 12:11 PM<BR>     Documentation filed: Charges VN

## 2015-09-27 ENCOUNTER — Other Ambulatory Visit: Payer: Self-pay | Admitting: Family

## 2015-09-30 NOTE — Telephone Encounter (Signed)
Last ov over  A year ago  Can refill for 60 days    Make office visit before   Runs ou.t

## 2015-10-13 ENCOUNTER — Ambulatory Visit: Payer: Self-pay | Admitting: Specialist

## 2015-10-13 ENCOUNTER — Encounter (HOSPITAL_BASED_OUTPATIENT_CLINIC_OR_DEPARTMENT_OTHER): Payer: Self-pay | Admitting: *Deleted

## 2015-10-13 ENCOUNTER — Other Ambulatory Visit: Payer: Self-pay | Admitting: Specialist

## 2015-10-13 NOTE — H&P (Signed)
[name] is an 45 y.o. female.   Chief Complaint: Increased severe masses righr nesk x 2 [id number] growth and discomfort from large masses with decreased range of motion  Past Medical History  Diagnosis Date  . Hypertension   . Hypoglycemia   . Teratoma     Past Surgical History  Procedure Laterality Date  . Abdominal hysterectomy    . Cesarean section    . Dermoid cyst  excision    . 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 Ectopic pregnancy surgery    . Cryotherapy  2013    to neck    Family History  Problem Relation Age of Onset  . Hypertension Other    Social History:  reports that she has never smoked. She has never used smokeless tobacco. She reports that she drinks alcohol. She reports that she does not use illicit drugs.  Allergies:  Allergies  Allergen Reactions  . Tylox [Oxycodone-Acetaminophen] Swelling    Caused face to swell    No prescriptions prior to admission    No results found for this or any previous visit (from the past 48 hour(s)). No results found.  Review of Systems  Constitutional: Negative.   Eyes: Negative.   Respiratory: Negative.   Cardiovascular: Negative.   Gastrointestinal: Negative.   Genitourinary: Negative.   Musculoskeletal: Positive for myalgias and neck pain.  Skin: Positive for itching.  Neurological: Negative.   Endo/Heme/Allergies: Negative.   Psychiatric/Behavioral: Negative.     There were no vitals taken for this visit. Physical Exam   Assessment/Plan For excision and plastic reconstruction post op low dosed irradiation is scheduled  Theresa Lyn L, MD 10/13/2015, 9:19 AM

## 2015-10-13 NOTE — Progress Notes (Signed)
Bring all medications. Pt will try to come Friday for her BMET and if not will do day of surgery. Pt works out of town.

## 2015-10-17 ENCOUNTER — Encounter (HOSPITAL_BASED_OUTPATIENT_CLINIC_OR_DEPARTMENT_OTHER)
Admission: RE | Admit: 2015-10-17 | Discharge: 2015-10-17 | Disposition: A | Discharge status: Home / Self Care | Payer: BLUE CROSS/BLUE SHIELD | Source: Ambulatory Visit | Attending: Specialist | Admitting: Specialist

## 2015-10-17 DIAGNOSIS — I10 Essential (primary) hypertension: Secondary | ICD-10-CM | POA: Diagnosis not present

## 2015-10-17 DIAGNOSIS — Z7982 Long term (current) use of aspirin: Secondary | ICD-10-CM | POA: Diagnosis not present

## 2015-10-17 DIAGNOSIS — Z79899 Other long term (current) drug therapy: Secondary | ICD-10-CM | POA: Diagnosis not present

## 2015-10-17 DIAGNOSIS — L910 Hypertrophic scar: Secondary | ICD-10-CM | POA: Diagnosis not present

## 2015-10-17 LAB — BASIC METABOLIC PANEL
Anion gap: 14 (ref 5–15)
BUN: 16 mg/dL (ref 6–20)
CHLORIDE: 100 mmol/L — AB (ref 101–111)
CO2: 28 mmol/L (ref 22–32)
CREATININE: 0.78 mg/dL (ref 0.44–1.00)
Calcium: 10.1 mg/dL (ref 8.9–10.3)
GFR calc Af Amer: >60 mL/min (ref >60)
GLUCOSE: 122 mg/dL — AB (ref 65–99)
POTASSIUM: 3.7 mmol/L (ref 3.5–5.1)
Sodium: 142 mmol/L (ref 135–145)

## 2015-10-20 ENCOUNTER — Ambulatory Visit
Admission: RE | Admit: 2015-10-20 | Discharge: 2015-10-20 | Disposition: A | Discharge status: Home / Self Care | Payer: BLUE CROSS/BLUE SHIELD | Source: Ambulatory Visit | Attending: Radiation Oncology | Admitting: Radiation Oncology

## 2015-10-20 ENCOUNTER — Ambulatory Visit (HOSPITAL_BASED_OUTPATIENT_CLINIC_OR_DEPARTMENT_OTHER): Payer: BLUE CROSS/BLUE SHIELD | Admitting: Anesthesiology

## 2015-10-20 ENCOUNTER — Encounter (HOSPITAL_BASED_OUTPATIENT_CLINIC_OR_DEPARTMENT_OTHER)
Admission: RE | Disposition: A | Discharge status: Home / Self Care | Payer: Self-pay | Source: Ambulatory Visit | Attending: Specialist

## 2015-10-20 ENCOUNTER — Encounter (HOSPITAL_BASED_OUTPATIENT_CLINIC_OR_DEPARTMENT_OTHER): Payer: Self-pay | Admitting: Anesthesiology

## 2015-10-20 ENCOUNTER — Ambulatory Visit (HOSPITAL_BASED_OUTPATIENT_CLINIC_OR_DEPARTMENT_OTHER)
Admission: RE | Admit: 2015-10-20 | Discharge: 2015-10-20 | Disposition: A | Discharge status: Home / Self Care | Payer: BLUE CROSS/BLUE SHIELD | Source: Ambulatory Visit | Attending: Specialist | Admitting: Specialist

## 2015-10-20 DIAGNOSIS — L910 Hypertrophic scar: Secondary | ICD-10-CM | POA: Insufficient documentation

## 2015-10-20 DIAGNOSIS — Z79899 Other long term (current) drug therapy: Secondary | ICD-10-CM | POA: Insufficient documentation

## 2015-10-20 DIAGNOSIS — Z7982 Long term (current) use of aspirin: Secondary | ICD-10-CM | POA: Insufficient documentation

## 2015-10-20 DIAGNOSIS — I10 Essential (primary) hypertension: Secondary | ICD-10-CM | POA: Insufficient documentation

## 2015-10-20 HISTORY — PX: EXCISION MASS NECK: SHX6703

## 2015-10-20 SURGERY — EXCISION, MASS, NECK
Anesthesia: General | Site: Neck | Laterality: Bilateral

## 2015-10-20 MED ORDER — BACITRACIN ZINC 500 UNIT/GM EX OINT
TOPICAL_OINTMENT | CUTANEOUS | Status: AC
  Filled 2015-10-20: qty 0.9

## 2015-10-20 MED ORDER — MIDAZOLAM HCL 2 MG/2ML IJ SOLN
INTRAMUSCULAR | Status: AC
  Filled 2015-10-20: qty 2

## 2015-10-20 MED ORDER — PHENYLEPHRINE HCL 10 MG/ML IJ SOLN
INTRAMUSCULAR | Status: DC | PRN
  Administered 2015-10-20: 40 ug via INTRAVENOUS

## 2015-10-20 MED ORDER — DEXAMETHASONE SODIUM PHOSPHATE 10 MG/ML IJ SOLN
INTRAMUSCULAR | Status: AC
  Filled 2015-10-20: qty 1

## 2015-10-20 MED ORDER — LIDOCAINE HCL (CARDIAC) 20 MG/ML IV SOLN
INTRAVENOUS | Status: DC | PRN
  Administered 2015-10-20: 60 mg via INTRAVENOUS

## 2015-10-20 MED ORDER — PHENYLEPHRINE HCL 10 MG/ML IJ SOLN
INTRAMUSCULAR | Status: AC
  Filled 2015-10-20: qty 1

## 2015-10-20 MED ORDER — FENTANYL CITRATE (PF) 100 MCG/2ML IJ SOLN: 25.0000 ug | INTRAMUSCULAR | Status: DC | PRN

## 2015-10-20 MED ORDER — FENTANYL CITRATE (PF) 100 MCG/2ML IJ SOLN
INTRAMUSCULAR | Status: AC
  Filled 2015-10-20: qty 2

## 2015-10-20 MED ORDER — CEFAZOLIN SODIUM-DEXTROSE 2-3 GM-% IV SOLR
2.0000 g | INTRAVENOUS | Status: AC
  Administered 2015-10-20: 2 g via INTRAVENOUS

## 2015-10-20 MED ORDER — LIDOCAINE-EPINEPHRINE 0.5 %-1:200000 IJ SOLN
INTRAMUSCULAR | Status: DC | PRN
  Administered 2015-10-20: 50 mL

## 2015-10-20 MED ORDER — PHENYLEPHRINE 40 MCG/ML (10ML) SYRINGE FOR IV PUSH (FOR BLOOD PRESSURE SUPPORT)
PREFILLED_SYRINGE | INTRAVENOUS | Status: AC
  Filled 2015-10-20: qty 10

## 2015-10-20 MED ORDER — PROPOFOL 500 MG/50ML IV EMUL
INTRAVENOUS | Status: AC
  Filled 2015-10-20: qty 50

## 2015-10-20 MED ORDER — CEFAZOLIN SODIUM-DEXTROSE 2-3 GM-% IV SOLR: 2.0000 g | INTRAVENOUS | Status: DC

## 2015-10-20 MED ORDER — MIDAZOLAM HCL 2 MG/2ML IJ SOLN
1.0000 mg | INTRAMUSCULAR | Status: DC | PRN
  Administered 2015-10-20: 2 mg via INTRAVENOUS

## 2015-10-20 MED ORDER — PROPOFOL 10 MG/ML IV BOLUS
INTRAVENOUS | Status: DC | PRN
  Administered 2015-10-20: 150 mg via INTRAVENOUS

## 2015-10-20 MED ORDER — LACTATED RINGERS IV SOLN
INTRAVENOUS | Status: DC
Start: 2015-10-20 — End: 2015-10-20
  Administered 2015-10-20 (×3): via INTRAVENOUS

## 2015-10-20 MED ORDER — LIDOCAINE-EPINEPHRINE (PF) 1 %-1:200000 IJ SOLN
INTRAMUSCULAR | Status: AC
  Filled 2015-10-20: qty 30

## 2015-10-20 MED ORDER — CEFAZOLIN SODIUM-DEXTROSE 2-3 GM-% IV SOLR
INTRAVENOUS | Status: AC
  Filled 2015-10-20: qty 50

## 2015-10-20 MED ORDER — SUCCINYLCHOLINE CHLORIDE 20 MG/ML IJ SOLN
INTRAMUSCULAR | Status: DC | PRN
  Administered 2015-10-20: 100 mg via INTRAVENOUS
  Administered 2015-10-20: 50 mg via INTRAVENOUS

## 2015-10-20 MED ORDER — ONDANSETRON HCL 4 MG/2ML IJ SOLN
INTRAMUSCULAR | Status: DC | PRN
  Administered 2015-10-20: 4 mg via INTRAVENOUS

## 2015-10-20 MED ORDER — GLYCOPYRROLATE 0.2 MG/ML IJ SOLN: 0.2000 mg | Freq: Once | INTRAMUSCULAR | Status: DC | PRN

## 2015-10-20 MED ORDER — FENTANYL CITRATE (PF) 100 MCG/2ML IJ SOLN
50.0000 ug | INTRAMUSCULAR | Status: DC | PRN
  Administered 2015-10-20: 100 ug via INTRAVENOUS
  Administered 2015-10-20: 50 ug via INTRAVENOUS

## 2015-10-20 MED ORDER — BACITRACIN ZINC 500 UNIT/GM EX OINT
TOPICAL_OINTMENT | CUTANEOUS | Status: AC
  Filled 2015-10-20: qty 28.35

## 2015-10-20 MED ORDER — ONDANSETRON HCL 4 MG/2ML IJ SOLN
INTRAMUSCULAR | Status: AC
  Filled 2015-10-20: qty 2

## 2015-10-20 MED ORDER — PHENYLEPHRINE HCL 10 MG/ML IJ SOLN
10.0000 mg | INTRAVENOUS | Status: DC | PRN
  Administered 2015-10-20: 50 ug/min via INTRAVENOUS

## 2015-10-20 MED ORDER — HYDROMORPHONE HCL 1 MG/ML IJ SOLN: 0.2500 mg | INTRAMUSCULAR | Status: DC | PRN

## 2015-10-20 MED ORDER — SUCCINYLCHOLINE CHLORIDE 20 MG/ML IJ SOLN
INTRAMUSCULAR | Status: AC
  Filled 2015-10-20: qty 1

## 2015-10-20 MED ORDER — LIDOCAINE HCL (CARDIAC) 20 MG/ML IV SOLN
INTRAVENOUS | Status: AC
Start: 2015-10-20 — End: 2015-10-20
  Filled 2015-10-20: qty 5

## 2015-10-20 MED ORDER — SCOPOLAMINE 1 MG/3DAYS TD PT72: 1.0000 | MEDICATED_PATCH | Freq: Once | TRANSDERMAL | Status: DC | PRN

## 2015-10-20 MED ORDER — LIDOCAINE-EPINEPHRINE 0.5 %-1:200000 IJ SOLN
INTRAMUSCULAR | Status: AC
  Filled 2015-10-20: qty 1

## 2015-10-20 MED ORDER — DEXAMETHASONE SODIUM PHOSPHATE 4 MG/ML IJ SOLN
INTRAMUSCULAR | Status: DC | PRN
  Administered 2015-10-20: 10 mg via INTRAVENOUS

## 2015-10-20 SURGICAL SUPPLY — 49 items
APPLICATOR COTTON TIP 6IN STRL (MISCELLANEOUS) IMPLANT
BENZOIN TINCTURE PRP APPL 2/3 (GAUZE/BANDAGES/DRESSINGS) ×4 IMPLANT
BLADE KNIFE PERSONA 15 (BLADE) ×4 IMPLANT
CLOSURE SKIN 1/8X3 (GAUZE/BANDAGES/DRESSINGS)
CLOSURE STERI-STRIP 1/2X4 (GAUZE/BANDAGES/DRESSINGS) ×2
CLSR STERI-STRIP ANTIMIC 1/2X4 (GAUZE/BANDAGES/DRESSINGS) ×6 IMPLANT
COVER BACK TABLE 60X90IN (DRAPES) ×4 IMPLANT
COVER MAYO STAND STRL (DRAPES) ×4 IMPLANT
DECANTER SPIKE VIAL GLASS SM (MISCELLANEOUS) ×4 IMPLANT
DRAPE U-SHAPE 76X120 STRL (DRAPES) ×4 IMPLANT
DRSG PAD ABDOMINAL 8X10 ST (GAUZE/BANDAGES/DRESSINGS) ×8 IMPLANT
ELECT NEEDLE BLADE 2-5/6 (NEEDLE) ×4 IMPLANT
ELECT REM PT RETURN 9FT ADLT (ELECTROSURGICAL) ×4
ELECTRODE REM PT RTRN 9FT ADLT (ELECTROSURGICAL) ×2 IMPLANT
GAUZE SPONGE 4X4 16PLY XRAY LF (GAUZE/BANDAGES/DRESSINGS) IMPLANT
GLOVE BIO SURGEON STRL SZ 6.5 (GLOVE) ×3 IMPLANT
GLOVE BIO SURGEONS STRL SZ 6.5 (GLOVE) ×1
GLOVE BIOGEL M STRL SZ7.5 (GLOVE) ×4 IMPLANT
GLOVE BIOGEL PI IND STRL 8 (GLOVE) ×2 IMPLANT
GLOVE BIOGEL PI INDICATOR 8 (GLOVE) ×2
GLOVE ECLIPSE 7.0 STRL STRAW (GLOVE) ×4 IMPLANT
GOWN STRL REUS W/ TWL LRG LVL3 (GOWN DISPOSABLE) IMPLANT
GOWN STRL REUS W/ TWL XL LVL3 (GOWN DISPOSABLE) ×4 IMPLANT
GOWN STRL REUS W/TWL LRG LVL3 (GOWN DISPOSABLE)
GOWN STRL REUS W/TWL XL LVL3 (GOWN DISPOSABLE) ×4
NEEDLE PRECISIONGLIDE 27X1.5 (NEEDLE) ×4 IMPLANT
NEEDLE SPNL 18GX3.5 QUINCKE PK (NEEDLE) ×4 IMPLANT
PACK BASIN DAY SURGERY FS (CUSTOM PROCEDURE TRAY) ×4 IMPLANT
PENCIL BUTTON HOLSTER BLD 10FT (ELECTRODE) ×4 IMPLANT
SHEILD EYE MED CORNL SHD 22X21 (OPHTHALMIC RELATED)
SHIELD EYE MED CORNL SHD 22X21 (OPHTHALMIC RELATED) IMPLANT
SLEEVE SCD COMPRESS KNEE MED (MISCELLANEOUS) ×4 IMPLANT
SPONGE GAUZE 4X4 12PLY STER LF (GAUZE/BANDAGES/DRESSINGS) IMPLANT
STRIP CLOSURE SKIN 1/8X3 (GAUZE/BANDAGES/DRESSINGS) IMPLANT
SUCTION FRAZIER HANDLE 10FR (MISCELLANEOUS)
SUCTION TUBE FRAZIER 10FR DISP (MISCELLANEOUS) IMPLANT
SUT ETHILON 5 0 P 3 18 (SUTURE)
SUT MERSILENE 5 0 P 3 (SUTURE) ×4 IMPLANT
SUT MNCRL AB 3-0 PS2 18 (SUTURE) ×4 IMPLANT
SUT MON AB 2-0 CT1 36 (SUTURE) ×12 IMPLANT
SUT NYLON ETHILON 5-0 P-3 1X18 (SUTURE) IMPLANT
SUT SILK 4 0 P 3 (SUTURE) IMPLANT
SUT SILK 6 0 P 1 (SUTURE) ×4 IMPLANT
SYR 50ML LL SCALE MARK (SYRINGE) ×8 IMPLANT
SYR CONTROL 10ML LL (SYRINGE) IMPLANT
TOWEL OR 17X24 6PK STRL BLUE (TOWEL DISPOSABLE) ×8 IMPLANT
TUBE CONNECTING 20'X1/4 (TUBING) ×1
TUBE CONNECTING 20X1/4 (TUBING) ×3 IMPLANT
UNDERPAD 30X30 (UNDERPADS AND DIAPERS) ×4 IMPLANT

## 2015-10-20 NOTE — Progress Notes (Signed)
  Radiation Oncology         ([phone]) [phone] ________________________________  Name: [name] MRN: [id number]  Date: 10/20/2015  DOB: [birth year]  SIMULATION AND TREATMENT PLANNING NOTE    ICD-9-CM ICD-10-CM   1. Keloid of skin 701.4 L91.0     DIAGNOSIS:  Hypertrophic scars involving the anterior neck region  NARRATIVE:  The patient was brought to the [place] suite.  Identity was confirmed.  All relevant records and images related to the planned course of therapy were reviewed.  The patient freely provided informed written consent to proceed with treatment after reviewing the details related to the planned course of therapy. The consent form was witnessed and verified by the simulation staff.  Then, the patient was set-up in a stable reproducible  supine position for radiation therapy.  CT images were obtained.  Surface markings were placed.  The CT images were loaded into the planning software.  Then the target and avoidance structures were contoured.  Treatment planning then occurred.  The radiation prescription was entered and confirmed.  Then, I designed and supervised the construction of a total of 3 medically necessary complex treatment devices ( custom electron cutouts).  I have requested : Isodose Plan. (Electron [employer] [place] isodose plan  I have ordered:dose calc.  PLAN:  The patient will receive 12 Gy in 3 fractions with treatments beginning tomorrow.  ________________________________  -----------------------------------  Blair Promise, PhD, MD

## 2015-10-20 NOTE — Discharge Instructions (Signed)
Activity (include date of return to work if known) As tolerated: NO showers NO driving No heavy activities  Diet:regular No restrictions:  Wound Care: Keep dressing clean & dry  Do not change dressings  Special Instructions: Do not raise arms up Call Doctor if any unusual problems occur such as pain, excessive Bleeding, unrelieved Nausea/vomiting, Fever &/or chills When lying down, keep head elevated on 2-3 pillows or back-rest  Follow-up appointment: Please call the office.  The patient received discharge instruction from:___________________________________________   Patient signature ________________________________________ / Date___________    Signature of individual providing instructions/ Date________________          Post Anesthesia Home Care Instructions  Activity: Get plenty of rest for the remainder of the day. A responsible adult should stay with you for 24 hours following the procedure.  For the next 24 hours, DO NOT: -Drive a car -Paediatric nurse -Drink alcoholic beverages -Take any medication unless instructed by your physician -Make any legal decisions or sign important papers.  Meals: Start with liquid foods such as gelatin or soup. Progress to regular foods as tolerated. Avoid greasy, spicy, heavy foods. If nausea and/or vomiting occur, drink only clear liquids until the nausea and/or vomiting subsides. Call your physician if vomiting continues.  Special Instructions/Symptoms: Your throat may feel dry or sore from the anesthesia or the breathing tube placed in your throat during surgery. If this causes discomfort, gargle with warm salt water. The discomfort should disappear within 24 hours.  If you had a scopolamine patch placed behind your ear for the management of post- operative nausea and/or vomiting:  1. The medication in the patch is effective for 72 hours, after which it should be removed.  Wrap patch in a tissue and discard in the trash.  Wash hands thoroughly with soap and water. 2. You may remove the patch earlier than 72 hours if you experience unpleasant side effects which may include dry mouth, dizziness or visual disturbances. 3. Avoid touching the patch. Wash your hands with soap and water after contact with the patch.

## 2015-10-20 NOTE — Anesthesia Preprocedure Evaluation (Signed)
Anesthesia Evaluation  Patient identified by MRN, date of birth, ID band Patient awake    Reviewed: Allergy & Precautions, H&P , NPO status , Patient's Chart, lab work & pertinent test results  Airway Mallampati: III  TM Distance: >3 FB Neck ROM: Limited    Dental no notable dental hx. (+) Teeth Intact, Dental Advisory Given   Pulmonary neg pulmonary ROS,    Pulmonary exam normal breath sounds clear to auscultation       Cardiovascular hypertension, Pt. on medications  Rhythm:Regular Rate:Normal     Neuro/Psych  Headaches, negative psych ROS   GI/Hepatic negative GI ROS, Neg liver ROS,   Endo/Other  negative endocrine ROS  Renal/GU negative Renal ROS  negative genitourinary   Musculoskeletal   Abdominal   Peds  Hematology negative hematology ROS (+)   Anesthesia Other Findings   Reproductive/Obstetrics negative OB ROS                             Anesthesia Physical Anesthesia Plan  ASA: II  Anesthesia Plan: General   Post-op Pain Management:    Induction: Intravenous  Airway Management Planned: Oral ETT and Video Laryngoscope Planned  Additional Equipment:   Intra-op Plan:   Post-operative Plan: Extubation in OR  Informed Consent: I have reviewed the patients History and Physical, chart, labs and discussed the procedure including the risks, benefits and alternatives for the proposed anesthesia with the patient or authorized representative who has indicated his/her understanding and acceptance.   Dental advisory given  Plan Discussed with: CRNA  Anesthesia Plan Comments:         Anesthesia Quick Evaluation

## 2015-10-20 NOTE — Progress Notes (Signed)
Post treatment dressing was applied with 4x4's secured with Hypafix tape to the anterior neck and jaw line region.  Noted mild bleeding of the right jaw line.  Given Hypafix tape and 4x4 gauze for reinforcement of dressing as needed.  Admits to soreness in the aformentioned areas.  Will return tomorrow for treatment.  Accompanied home with her husband.

## 2015-10-20 NOTE — Transfer of Care (Signed)
Immediate Anesthesia Transfer of Care Note  Patient: [name]  Procedure(s) Performed: Procedure(s): EXCISION OF LARGE KELOIDS SEVERE RIGHT AND LEFT NECK WITH PLASTIC RECONSTRUCTION (Bilateral)  Patient Location: PACU  Anesthesia Type:General  Level of Consciousness: awake and sedated  Airway & Oxygen Therapy: Patient Spontanous Breathing and Patient connected to face mask oxygen  Post-op Assessment: Report given to RN and Post -op Vital signs reviewed and stable  Post vital signs: Reviewed and stable  Last Vitals:  Filed Vitals:   10/20/15 0709  BP: 126/83  Pulse: 87  Temp: 36.8 C  Resp: 18    Complications: No apparent anesthesia complications

## 2015-10-20 NOTE — H&P (Signed)
[name] is an 45 y.o. female.   Chief Complaint: Increased keloidosis right and left neck areas HPI: Increased growth and discomfort  Past Medical History  Diagnosis Date  . Hypertension   . Hypoglycemia   . Teratoma     Past Surgical History  Procedure Laterality Date  . Abdominal hysterectomy    . Cesarean section    . Dermoid cyst  excision    . 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 Ectopic pregnancy surgery    . Cryotherapy  2013    to neck  . Abdominal hernia repair      Family History  Problem Relation Age of Onset  . Hypertension Other    Social History:  reports that she has never smoked. She has never used smokeless tobacco. She reports that she drinks alcohol. She reports that she does not use illicit drugs.  Allergies:  Allergies  Allergen Reactions  . Tylox [Oxycodone-Acetaminophen] Swelling    Caused face to swell    Medications Prior to Admission  Medication Sig Dispense Refill  . aspirin EC 325 MG tablet Take 325 mg by mouth every 4 (four) hours as needed (pain).    . Chromium 200 MCG CAPS Take by mouth.    . co-enzyme Q-10 30 MG capsule Take 30 mg by mouth 3 (three) times daily.    Marland Kitchen FLUOCINOLONE ACETONIDE SCALP 0.01 % OIL APPLY TOPICALLY EVERY DAY 118.28 mL 0  . [place], Centella asiatica, (GOTU KOLA PO) Take 1 tablet by mouth daily.    . Misc Natural Products (TUMERSAID PO) Take by mouth.    . Olmesartan-Amlodipine-HCTZ 40-5-25 MG TABS TAKE 1 TABLET BY MOUTH EVERY DAY 60 tablet 0  . simvastatin (ZOCOR) 10 MG tablet Take 1 tablet (10 mg total) by mouth at bedtime. 30 tablet 3    No results found for this or any previous visit (from the past 48 hour(s)). No results found.  Review of Systems  Constitutional: Negative.   HENT: Negative.   Eyes: Negative.   Respiratory: Negative.   Cardiovascular: Negative.   Gastrointestinal: Negative.   Genitourinary: Negative.   Musculoskeletal: Negative.   Skin: Positive for  itching and rash.  Neurological: Negative.   Endo/Heme/Allergies: Negative.   Psychiatric/Behavioral: Negative.     Height 6\' 1"  (1.854 m), weight 111.131 kg (245 lb). Physical Exam   Assessment/Plan Severe keloidosis neck with increased growth for excision and plastic closure  Ife Vitelli L, MD 10/20/2015, 7:10 AM

## 2015-10-20 NOTE — Anesthesia Postprocedure Evaluation (Signed)
Anesthesia Post Note  Patient: [name]  Procedure(s) Performed: Procedure(s) (LRB): EXCISION OF LARGE KELOIDS SEVERE RIGHT AND LEFT NECK WITH PLASTIC RECONSTRUCTION (Bilateral)  Patient location during evaluation: PACU Anesthesia Type: General Level of consciousness: awake and alert Pain management: pain level controlled Vital Signs Assessment: post-procedure vital signs reviewed and stable Respiratory status: spontaneous breathing, nonlabored ventilation and respiratory function stable Cardiovascular status: blood pressure returned to baseline and stable Postop Assessment: no signs of nausea or vomiting Anesthetic complications: no    Last Vitals:  Filed Vitals:   10/20/15 0945 10/20/15 1000  BP: 115/68 108/71  Pulse: 81 83  Temp:    Resp: 12 14    Last Pain:  Filed Vitals:   10/20/15 1006  PainSc: 0-No pain                 Kalisha Keadle,W. EDMOND

## 2015-10-20 NOTE — Progress Notes (Signed)
  Radiation Oncology         [phone] ________________________________  Name: [name] MRN: [id number]  Date: 10/20/2015  DOB: 24-May-1971  Electron beam Simulation Note    ICD-9-CM ICD-10-CM   1. Keloid of skin 701.4 L91.0     Status: outpatient  NARRATIVE: The patient was Taken to the [facility] accelerator and placed on the treatment table.  bandages along the anterior neck and face were carefully removed. On observation the patient was noted to have a scar that extended from the left lateral neck all the way into the right lateral neck area. She also had a additional scar that ran to the right face in the right lower ear area.. Overall these areas adequately would require 3 separate electron fields. After consultation with physics was determined that the best be treated using an electron [place] which would require the patient to proceed to the  CT simulator for planning.  -----------------------------------  Blair Promise, PhD, MD

## 2015-10-20 NOTE — Brief Op Note (Signed)
10/20/2015  8:52 AM  PATIENT:  [name]  45 y.o. female  PRE-OPERATIVE DIAGNOSIS:  KELOIDS  POST-OPERATIVE DIAGNOSIS:  KELOIDS  PROCEDURE:  Procedure(s): EXCISION OF LARGE KELOIDS SEVERE RIGHT AND LEFT NECK WITH PLASTIC RECONSTRUCTION (Bilateral)  SURGEON:  Surgeon(s) and Role:    * Cristine Polio, MD - Primary  PHYSICIAN ASSISTANT:   ASSISTANTS: none   ANESTHESIA:   general  EBL:  Total I/O In: 1200 [I.V.:1200] Out: -   BLOOD ADMINISTERED:none  DRAINS: none   LOCAL MEDICATIONS USED:  XYLOCAINE   SPECIMEN:  Excision  DISPOSITION OF SPECIMEN:  PATHOLOGY  COUNTS:  YES  TOURNIQUET:  * No tourniquets in log *  DICTATION: .Other Dictation: Dictation Number [id number]  PLAN OF CARE: Discharge to home after PACU  PATIENT DISPOSITION:  PACU - hemodynamically stable.   Delay start of Pharmacological VTE agent (>24hrs) due to surgical blood loss or risk of bleeding: yes

## 2015-10-20 NOTE — Anesthesia Procedure Notes (Signed)
Procedure Name: Intubation Performed by: Terrance Mass Pre-anesthesia Checklist: Patient identified, Timeout performed and Emergency Drugs available Patient Re-evaluated:Patient Re-evaluated prior to inductionOxygen Delivery Method: Circle System Utilized Preoxygenation: Pre-oxygenation with 100% oxygen Intubation Type: IV induction Ventilation: Mask ventilation without difficulty Laryngoscope Size: Miller and 2 Grade View: Grade II Tube type: Oral Tube size: 7.0 mm Number of attempts: 1 Airway Equipment and Method: Stylet and Oral airway Placement Confirmation: ETT inserted through vocal cords under direct vision,  positive ETCO2 and breath sounds checked- equal and bilateral Secured at: 22 cm Tube secured with: Tape Dental Injury: Teeth and Oropharynx as per pre-operative assessment

## 2015-10-21 ENCOUNTER — Ambulatory Visit
Admission: RE | Admit: 2015-10-21 | Payer: BLUE CROSS/BLUE SHIELD | Source: Ambulatory Visit | Admitting: Radiation Oncology

## 2015-10-21 ENCOUNTER — Ambulatory Visit
Admission: RE | Admit: 2015-10-21 | Discharge: 2015-10-21 | Disposition: A | Discharge status: Home / Self Care | Payer: BLUE CROSS/BLUE SHIELD | Source: Ambulatory Visit | Attending: Radiation Oncology | Admitting: Radiation Oncology

## 2015-10-21 ENCOUNTER — Encounter (HOSPITAL_BASED_OUTPATIENT_CLINIC_OR_DEPARTMENT_OTHER): Payer: Self-pay | Admitting: Specialist

## 2015-10-21 DIAGNOSIS — L910 Hypertrophic scar: Secondary | ICD-10-CM

## 2015-10-21 DIAGNOSIS — Z510 Encounter for antineoplastic radiation therapy: Secondary | ICD-10-CM | POA: Diagnosis not present

## 2015-10-21 NOTE — Progress Notes (Signed)
  Radiation Oncology         [phone] ________________________________  Name: [name] MRN: [id number]  Date: 10/20/2015  DOB: 05/08/1971  SIMULATION AND TREATMENT PLANNING NOTE    ICD-9-CM ICD-10-CM   1. Keloid of skin 701.4 L91.0     DIAGNOSIS:  Hypertrophic scars involving the anterior neck region  NARRATIVE:  The patient was brought to the [place] suite.  Identity was confirmed.  All relevant records and images related to the planned course of therapy were reviewed.  The patient freely provided informed written consent to proceed with treatment after reviewing the details related to the planned course of therapy. The consent form was witnessed and verified by the simulation staff.  Then, the patient was set-up in a stable reproducible  supine position for radiation therapy.  CT images were obtained.  Surface markings were placed.  The CT images were loaded into the planning software.  Then the target and avoidance structures were contoured.  Treatment planning then occurred.  The radiation prescription was entered and confirmed.  Then, I designed and supervised the construction of a total of 2 medically necessary complex treatment devices.  I have requested : Intensity Modulated Radiotherapy (IMRT) is medically necessary for this case for the following reason:  Dose homogeneity..  I have ordered:dose calc.  PLAN:  The patient will receive 12 Gy in 3 fractions.  The patient originally was scheduled be treated with 3 separate electron fields. After extensive planning including [place] isodose plans  potential for significant overlap or under dosing along the surgical beds was noted. In addition significant hot spots in excess of 50% to percent of the prescription dose were noted. Several different options for planning were attempted however the best coverage with best homogeneity was an IMRT plan the field using 2 volumetric arc's with 6 MV photons. We did obtain  approval from the patient's insurance company prior to her IMRT plan completion and treatment.  ________________________________  -----------------------------------  Blair Promise, PhD, MD

## 2015-10-21 NOTE — Op Note (Signed)
[name], [name]              ACCOUNT NO.:  [id number]  MEDICAL RECORD NO.:  [id number]  LOCATION:                                 FACILITY:  PHYSICIAN:  Berneta Sages L. Bess Saltzman, M.D.DATE OF BIRTH:  [birth year]  DATE OF PROCEDURE:  10/20/2015 DATE OF DISCHARGE:                              OPERATIVE REPORT   INDICATIONS FOR PROCEDURE:  This is a 45 year old lady with severe keloidosis on the right face, right neck, and left neck x5 lesions.  PROCEDURES DONE:  Excision of areas and plaster reconstruction of each. The areas measuring 16 x 5 cm, 8 x 5 cm, 20 x 5 cm, and 6 x 5 cm with increased growth and discomfort and disfigurement.  The patient has been prepared to have these removed.  A low-dose radiation treatment was done at [facility] which is the standard of care for this kind of patient.  All procedures in detail were explained to the patient who understands the risks and possible complications.  Preoperatively, the patient was sat up and drawn for the excision of all of the areas mentioned.  She underwent general anesthesia, intubated orally.  Prep was done to the face and neck areas and the head and scalp with Betadine solution, walled off with sterile towels and drapes so as to make a sterile field.  The areas were drawn with a marking pen in cross hatches; 33.33 per cent Xylocaine with epinephrine was injected locally, a total of 100 mL in 1:300,000 concentration.  This was allowed to weight up.  An excision was made on each keloid area with proper margins to the area down to deep subcutaneous tissue.  Bleeders were controlled with the Bovie and anticoagulation.  Next, superior and inferior flaps of each one was freed up significantly to allow closure without tension, then closed with deep sutures of 2-0 Monocryl x2 levels and subdermal suture of 3-0 Monocryl, and then a running subcuticular stitch of 3-0 Monocryl.  Closed on all with good success and good  aesthetics. After this, Steri-Strips and soft dressings were applied to all the areas, pressure dressings and Hypafix tape.  The patient is to be seen back at [facility] for markings of each keloid area and to start her treatment this week.  Estimated blood loss less than 100 mL. Complications none.     [name], M.D.     Elie Confer  D:  10/20/2015  T:  10/20/2015  Job:  [id number]

## 2015-10-21 NOTE — Progress Notes (Signed)
  Radiation Oncology         [phone] ________________________________  Name: [name] MRN: [id number]  Date: 10/21/2015  DOB: November 06, 1970  Weekly Radiation Therapy Management    ICD-9-CM ICD-10-CM   1. Keloid of skin 701.4 L91.0      Current Dose: 4 Gy     Planned Dose:  12 Gy  Narrative . . . . . . . . The patient presents for routine under treatment assessment.                                   The patient Continues to have pain along the anterior neck from her surgery. She is taking hydrocodone.                                 Set-up films were reviewed.                                 The chart was checked. Physical Findings. . . Steri-Strips in place along the neck and face region. No signs of infection or drainage. There appears to be less swelling along the surgical bed today Impression . . . . . . . The patient is tolerating radiation. Plan . . . . . . . . . . . . Continue treatment as planned.  ________________________________   Blair Promise, PhD, MD

## 2015-10-21 NOTE — Progress Notes (Signed)
  Radiation Oncology         [phone] ________________________________  Name: [name] MRN: [id number]  Date: 10/21/2015  DOB: 1971-03-20  Simulation Verification Note    ICD-9-CM ICD-10-CM   1. Keloid of skin 701.4 L91.0     Status: outpatient  NARRATIVE: The patient was brought to the treatment unit and placed in the planned treatment position. The clinical setup was verified. Then port films were obtained and uploaded to the radiation oncology medical record software.  The treatment beams were carefully compared against the planned radiation fields. The position location and shape of the radiation fields was reviewed. They targeted volume of tissue appears to be appropriately covered by the radiation beams. Organs at risk appear to be excluded as planned.  Based on my personal review, I approved the simulation verification. The patient's treatment will proceed as planned.  -----------------------------------  Blair Promise, PhD, MD

## 2015-10-21 NOTE — Addendum Note (Signed)
Encounter addended by: Gery Pray, MD on: 10/21/2015  5:49 PM<BR>     Documentation filed: Notes Section

## 2015-10-22 ENCOUNTER — Ambulatory Visit
Admission: RE | Admit: 2015-10-22 | Discharge: 2015-10-22 | Disposition: A | Discharge status: Home / Self Care | Payer: BLUE CROSS/BLUE SHIELD | Source: Ambulatory Visit | Attending: Radiation Oncology | Admitting: Radiation Oncology

## 2015-10-22 DIAGNOSIS — Z510 Encounter for antineoplastic radiation therapy: Secondary | ICD-10-CM | POA: Diagnosis not present

## 2015-10-22 NOTE — Progress Notes (Signed)
Received patient in the clinic following radiation treatment. Right cheek incision and anterior neck incision well approximated without redness or drainage. Edema of right neck and right shoulder noted. Applied an occlusive dressing secured with gauze to cover and protect incisions. BP elevated. Patient reports she did not take her bp medication today. Instructed patient to take bp medication once she gets home and she verbalized understanding.

## 2015-10-23 ENCOUNTER — Encounter: Payer: Self-pay | Admitting: Radiation Oncology

## 2015-10-23 ENCOUNTER — Ambulatory Visit
Admission: RE | Admit: 2015-10-23 | Discharge: 2015-10-23 | Disposition: A | Discharge status: Home / Self Care | Payer: BLUE CROSS/BLUE SHIELD | Source: Ambulatory Visit | Attending: Radiation Oncology | Admitting: Radiation Oncology

## 2015-10-23 VITALS — BP 138/81 | HR 63 | Temp 98.1°F | Resp 20 | Wt 252.7 lb

## 2015-10-23 DIAGNOSIS — L910 Hypertrophic scar: Secondary | ICD-10-CM

## 2015-10-23 DIAGNOSIS — Z510 Encounter for antineoplastic radiation therapy: Secondary | ICD-10-CM | POA: Diagnosis not present

## 2015-10-23 NOTE — Progress Notes (Signed)
  Radiation Oncology         [phone] ________________________________  Name: [name] MRN: [id number]  Date: 10/23/2015  DOB: August 31, 1970  Weekly Radiation Therapy Management    ICD-9-CM ICD-10-CM   1. Keloid of skin 701.4 L91.0     Current Dose: 12 Gy     Planned Dose:  12 Gy  Narrative . . . . . . . . The patient presents for routine under treatment assessment.                                   The patient Has noticed less swelling since starting back on her blood pressure medication. She continues to take hydrocodone and Keflex 36 hours. Patient reported some swallowing difficulties last night but this morning has had no further problems concerning this issue.                                 Set-up films were reviewed.                                 The chart was checked. Physical Findings. . .  weight is 252 lb 11.2 oz (114.624 kg). Her oral temperature is 98.1 F (36.7 C). Her blood pressure is 138/81 and her pulse is 63. Her respiration is 20 and oxygen saturation is 100%. . Examination of the neck reveals less swelling. Steri-Strips remain in place. No signs of infection. The oral cavity is moist without secondary infection. Impression . . . . . . . The patient is tolerating radiation. Plan . . . . . . . . . . . . Routine follow-up in one month.  ________________________________   Blair Promise, PhD, MD

## 2015-10-23 NOTE — Progress Notes (Addendum)
SBRT 3/3  Neck, steri strips intact, healing, still has some swelling stated patient,  States discomfort, takes hydrocodone apap 10/325mg  q 6 hours and keflex q t hours BP 138/81 mmHg  Pulse 63  Temp(Src) 98.1 F (36.7 C) (Oral)  Resp 20  Wt 252 lb 11.2 oz (114.624 kg)  SpO2 100%  Wt Readings from Last 3 Encounters:  10/23/15 252 lb 11.2 oz (114.624 kg)  10/20/15 248 lb (112.492 kg)  09/25/15 250 lb 8 oz (113.626 kg)

## 2015-10-30 NOTE — Progress Notes (Signed)
  Radiation Oncology         [phone] ________________________________  Name: [name] MRN: [id number]  Date: 10/23/2015  DOB: May 14, 1971  End of Treatment Note    ICD-9-CM ICD-10-CM   1. Keloid of skin 701.4 L91.0     DIAGNOSIS: Hypertrophic scars involving the anterior neck region     Indication for treatment:  Postop chances for recurrence       Radiation treatment dates:   10/21/2015-10/23/2015  Site/dose:   Anterior neck area 12 gray in 3 fractions  Beams/energy:   Intensity modulated radiation therapy, 6 MV photons  Narrative: The patient tolerated radiation treatment relatively well.   Patient experienced some soreness and swelling in the operative bed most likely related to her surgery. Patient was initially to be treated with 3 separate electron fields however do to the significant dose inhomogeneity patient proceeded with intensity modulated radiotherapy which covered the area much better with more homogeneous dose distribution.  Plan: The patient has completed radiation treatment. The patient will return to radiation oncology clinic for routine followup in one month. I advised them to call or return sooner if they have any questions or concerns related to their recovery or treatment.  -----------------------------------  Blair Promise, PhD, MD

## 2015-11-24 ENCOUNTER — Encounter: Payer: Self-pay | Admitting: Oncology

## 2015-11-27 ENCOUNTER — Ambulatory Visit
Admission: RE | Admit: 2015-11-27 | Discharge: 2015-11-27 | Disposition: A | Discharge status: Home / Self Care | Payer: BLUE CROSS/BLUE SHIELD | Source: Ambulatory Visit | Attending: Radiation Oncology | Admitting: Radiation Oncology

## 2015-11-27 VITALS — BP 152/86 | HR 97 | Temp 98.1°F | Ht 73.0 in | Wt 250.9 lb

## 2015-11-27 DIAGNOSIS — L910 Hypertrophic scar: Secondary | ICD-10-CM

## 2015-11-27 NOTE — Progress Notes (Signed)
[name] here for follow up.  She reports having pain in the back of her neck especially when driving.  She is taking ibuprofen as needed.  She saw Dr. Towanda Malkin yesterday and was advised to use ice packs for the pain.  She is using Mederma on her scars.  The incisions on the right side of her face and neck are healed with hyperpigmentation.  BP 152/86 mmHg  Pulse 97  Temp(Src) 98.1 F (36.7 C) (Oral)  Ht 6\' 1"  (1.854 m)  Wt 250 lb 14.4 oz (113.807 kg)  BMI 33.11 kg/m2

## 2015-11-27 NOTE — Progress Notes (Signed)
  Radiation Oncology         [phone] ________________________________  Name: [name] MRN: [id number]  Date: 11/27/2015  DOB: June 17, 1971  Follow-Up Visit Note  CC: Lottie Dawson, MD  Cristine Polio, MD    ICD-9-CM ICD-10-CM   1. Keloid of skin 701.4 L91.0     Diagnosis: Hypertrophic scars involving the anterior neck region  Interval Since Last Radiation: 1 months  10/21/2015-10/23/2015: Anterior neck area 12 gray in 3 fractions  Narrative:  The patient returns today for routine follow-up. She reports pain in the back of her neck; especially while driving. She is taking ibuprofen as needed. She saw Dr. Towanda Malkin yesterday and was advised to use ice packs for the pain. She is using Mederma on her scars. She is reporting anterior neck tenderness.  She is happy to not have to wear a  Scarf out in public as she did with her large keloid.  ALLERGIES:  is allergic to tylox.  Meds: Current Outpatient Prescriptions  Medication Sig Dispense Refill  . Chromium 200 MCG CAPS Take 200 mcg by mouth daily. Reported on 10/23/2015    . co-enzyme Q-10 30 MG capsule Take 30 mg by mouth 3 (three) times daily.    Marland Kitchen ibuprofen (ADVIL,MOTRIN) 200 MG tablet Take 200 mg by mouth every 6 (six) hours as needed.    . Olmesartan-Amlodipine-HCTZ 40-5-25 MG TABS TAKE 1 TABLET BY MOUTH EVERY DAY 60 tablet 0  . HYDROcodone-acetaminophen (NORCO) 10-325 MG tablet Take 1 tablet by mouth every 6 (six) hours. Reported on 11/27/2015    . magnesium hydroxide (EQ MILK OF MAGNESIA) 400 MG/5ML suspension Take by mouth daily as needed for mild constipation. Reported on 11/27/2015     No current facility-administered medications for this encounter.    Physical Findings: The patient is in no acute distress. Patient is alert and oriented.  height is 6\' 1"  (1.854 m) and weight is 250 lb 14.4 oz (113.807 kg). Her oral temperature is 98.1 F (36.7 C). Her blood pressure is 152/86 and her pulse is 97.   Lungs are  clear to auscultation bilaterally. Heart has regular rate and rhythm. No palpable cervical, supraclavicular, or axillary adenopathy.  The surgical bed is healing well with no signs of infection or drainage. Mild hyperpigmentation changes noted. No sign of keloid recurrence.  Lab Findings: Lab Results  Component Value Date   WBC 8.3 01/05/2015   HGB 14.3 01/05/2015   HCT 41.9 01/05/2015   MCV 88.4 01/05/2015   PLT 268 01/05/2015    Radiographic Findings: No results found.  Impression:  The patient is recovering from the effects of radiation. No sign of keloid recurrence.  Plan:  She is scheduled to follow up with Dr. Towanda Malkin in 8 weeks. She will see me on a PRN basis. ____________________________________ -----------------------------------  Blair Promise, PhD, MD  This document serves as a record of services personally performed by Gery Pray, MD. It was created on his behalf by Darcus Austin, a trained medical scribe. The creation of this record is based on the scribe's personal observations and the provider's statements to them. This document has been checked and approved by the attending provider.

## 2016-04-30 ENCOUNTER — Other Ambulatory Visit: Payer: Self-pay | Admitting: Internal Medicine

## 2016-05-03 NOTE — Telephone Encounter (Signed)
Not a pt of Dr. Regis Bill.  Denied.

## 2016-10-05 ENCOUNTER — Telehealth: Payer: Self-pay | Admitting: Internal Medicine

## 2016-10-05 NOTE — Progress Notes (Signed)
Chief Complaint  Patient presents with  . Shortness of Breath  . Hypertension    HPI: [name] 46 y.o.   sda  Apt  L;ast visit w me was 2014 and saw webb  for pcp after that   Not seen in our practice   No med visits  Under care for her massive keloids  surgical and radiation rx       Send in by appt from [facility]  .   Comes or HT and SOB   Per [facility]   Taking bp medication     Took   bp at work and was way over goal and got concerned  .  Last Friday . And stress.      174 /127 .  At work    Bought  A wrist monitor and then check    3 x per day  All over the place   readings Last night  160? Range but 140 range otherwise  bp was refilled in my name    Without  Me seeing her  2 17  No lab monitoring  recently ha nhas had teratoma has not seen gyne recently  Sob doe off and on .     eating  Hard  .   Sleep :   5 day rx   At least 6  Keloid  nmo tob  Seldom   Juice  2 .    Sodas.   No cough   Sleep  No dx osa but does   Jump up  ? Choked  At some . At night and interrupted some  hh of  4 pet dog.  ROS: See pertinent positives and negatives per HPI.had raidation ot neck for masseive keloids   Feels right leg is "hot " no pain but feels different    Dose travel with job car  No weakness or swelling   Or injury   Past Medical History:  Diagnosis Date  . Hypertension   . Hypoglycemia   . Radiation 10/21/2015-10/23/2015   Anterior neck area 12 gray in 3 fractions  . Teratoma    Past Surgical History:  Procedure Laterality Date  . ABDOMINAL HERNIA REPAIR    . ABDOMINAL HYSTERECTOMY    . CESAREAN SECTION    . CRYOTHERAPY  2013   to neck  . DERMOID CYST  EXCISION    . ECTOPIC PREGNANCY SURGERY    . EXCISION MASS NECK Bilateral 10/20/2015   Procedure: EXCISION OF LARGE KELOIDS SEVERE RIGHT AND LEFT NECK WITH PLASTIC RECONSTRUCTION;  Surgeon: Cristine Polio, MD;  Location: [place];  Service: Plastics;  Laterality: Bilateral;  . HAND SURGERY     amputation of right index finger     Family History  Problem Relation Age of Onset  . Hypertension Other     Social History   Social History  . Marital status: Married    Spouse name: N/A  . Number of children: 2  . Years of education: N/A   Occupational History  . Therapist, music    Social History Main Topics  . Smoking status: Never Smoker  . Smokeless tobacco: Never Used  . Alcohol use Yes     Comment: socially  . Drug use: No  . Sexual activity: Yes    Birth control/ protection: Surgical   Other Topics Concern  . None   Social History Narrative  . None    Outpatient Medications Prior to Visit  Medication Sig Dispense Refill  . Chromium 200 MCG CAPS Take 200 mcg by mouth daily. Reported on 10/23/2015    . co-enzyme Q-10 30 MG capsule Take 30 mg by mouth 3 (three) times daily.    . Olmesartan-Amlodipine-HCTZ 40-5-25 MG TABS TAKE 1 TABLET BY MOUTH EVERY DAY 60 tablet 0  . HYDROcodone-acetaminophen (NORCO) 10-325 MG tablet Take 1 tablet by mouth every 6 (six) hours. Reported on 11/27/2015    . ibuprofen (ADVIL,MOTRIN) 200 MG tablet Take 200 mg by mouth every 6 (six) hours as needed.    . magnesium hydroxide (EQ MILK OF MAGNESIA) 400 MG/5ML suspension Take by mouth daily as needed for mild constipation. Reported on 11/27/2015     No facility-administered medications prior to visit.      EXAM:  BP (!) 142/102 (BP Location: Right Arm, Patient Position: Sitting, Cuff Size: Normal)   Pulse 82   Temp 98.3 F (36.8 C) (Oral)   Ht 6\' 1"  (1.854 m)   Wt 245 lb 3.2 oz (111.2 kg)   BMI 32.35 kg/m   Body mass index is 32.35 kg/m.  GENERAL: vitals reviewed and listed above, alert, oriented, appears well hydrated and in no acute distress HEENT: atraumatic, conjunctiva  clear, no obvious abnormalities on inspection of external nose and ears tms ok OP : no lesion edema or exudate  NECK: no obvious masses on inspection palpation  helaed scars fro keloids  LUNGS: clear to  auscultation bilaterally, no wheezes, rales or rhonchi, good air movement CV: HRRR, no clubbing cyanosis or  peripheral edema nl cap refill   Abdomen:  Sof,t normal bowel sounds without hepatosplenomegaly, no guarding rebound or masses no CVA tenderness MS: moves all extremities without noticeable focal  Abnormality ri ght leg ? assymmetric  Both tight but no pain or redness  PSYCH: pleasant and cooperative, no obvious depression or anxiety  BP Readings from Last 3 Encounters:  10/07/16 (!) 142/102  11/27/15 (!) 152/86  10/23/15 138/81   Wt Readings from Last 3 Encounters:  10/07/16 245 lb 3.2 oz (111.2 kg)  11/27/15 250 lb 14.4 oz (113.8 kg)  10/23/15 252 lb 11.2 oz (114.6 kg)   EKG SR ns changes  Voltage lvh but no st assoc changes  Rate 71 nl intervals  AssessSSMENT AND PLAN:  Discussed the following assessment and plan:  Essential hypertension - Plan: EKG XX123456, Basic metabolic panel, CBC with Differential/Platelet, Hepatic function panel, Lipid panel, TSH, T4, free, Sedimentation rate, D-dimer, quantitative (not at [facility]), DG Chest 2 View  Shortness of breath - Plan: EKG [id number], Basic metabolic panel, CBC with Differential/Platelet, Hepatic function panel, Lipid panel, TSH, T4, free, Sedimentation rate, D-dimer, quantitative (not at [facility]), DG Chest 2 View  Right leg  symptoms  - feels hot  - Plan: Basic metabolic panel, CBC with Differential/Platelet, Hepatic function panel, Lipid panel, TSH, T4, free, Sedimentation rate, D-dimer, quantitative (not at [facility]), DG Chest 2 View Pt was seeing [facility]  Dr Nadara Mode lost to fu follow up. Plan labs and close fu .   Uncertain cause of her symptoms could be stress consider echocardiogram lab today close follow-up 2-3 weeks with her wrist monitor in readings. Since I haven't seen her in 3-4 years we'll try to sort review health issues. No alarming findings on her exam today.  bp mildly elevated  Will refill med until follow up assessment    -Patient advised to return or notify health care team  if symptoms worsen ,persist or new concerns arise.  Patient Instructions  Checking blood work today chest x-ray Not sure about the right leg I don't see swollen but sometimes we get more evaluation. Such as a Doppler test. Make an appointment for 2-3 weeks from now and bring in your blood pressure machine with readings we'll decide if we need to change her add to your medicine. There could be many causes of your symptoms will try to get to the bottom of it over time. Your exam is reassuring today except her blood pressure is up modestly. Take blood pressure readings twice a day for 10-14 days  and then periodically . Bring in the readings pleasure monitor to your next visit.    DASH Eating Plan DASH stands for "Dietary Approaches to Stop Hypertension." The DASH eating plan is a healthy eating plan that has been shown to reduce high blood pressure (hypertension). Additional health benefits may include reducing the risk of type 2 diabetes mellitus, heart disease, and stroke. The DASH eating plan may also help with weight loss. What do I need to know about the DASH eating plan? For the DASH eating plan, you will follow these general guidelines:  Choose foods with less than 150 milligrams of sodium per serving (as listed on the food label).  Use salt-free seasonings or herbs instead of table salt or sea salt.  Check with your health care provider or pharmacist before using salt substitutes.  Eat lower-sodium products. These are often labeled as "low-sodium" or "no salt added."  Eat fresh foods. Avoid eating a lot of canned foods.  Eat more vegetables, fruits, and low-fat dairy products.  Choose whole grains. Look for the word "whole" as the first word in the ingredient list.  Choose fish and skinless chicken or [place] more often than red meat. Limit fish, poultry, and meat to 6 oz (170 g) each day.  Limit sweets, desserts, sugars,  and sugary drinks.  Choose heart-healthy fats.  Eat more home-cooked food and less restaurant, buffet, and fast food.  Limit fried foods.  Do not fry foods. Cook foods using methods such as baking, boiling, grilling, and broiling instead.  When eating at a restaurant, ask that your food be prepared with less salt, or no salt if possible. What foods can I eat? Seek help from a dietitian for individual calorie needs. Grains  Whole grain or whole wheat bread. Brown rice. Whole grain or whole wheat pasta. Quinoa, bulgur, and whole grain cereals. Low-sodium cereals. Corn or whole wheat flour tortillas. Whole grain cornbread. Whole grain crackers. Low-sodium crackers. Vegetables  Fresh or frozen vegetables (raw, steamed, roasted, or grilled). Low-sodium or reduced-sodium tomato and vegetable juices. Low-sodium or reduced-sodium tomato sauce and paste. Low-sodium or reduced-sodium canned vegetables. Fruits  All fresh, canned (in natural juice), or frozen fruits. Meat and Other Protein Products  Ground beef (85% or leaner), grass-fed beef, or beef trimmed of fat. Skinless chicken or [place]. Ground chicken or [place]. Pork trimmed of fat. All fish and seafood. Eggs. Dried beans, peas, or lentils. Unsalted nuts and seeds. Unsalted canned beans. Dairy  Low-fat dairy products, such as skim or 1% milk, 2% or reduced-fat cheeses, low-fat ricotta or cottage cheese, or plain low-fat yogurt. Low-sodium or reduced-sodium cheeses. Fats and Oils  Tub margarines without trans fats. Light or reduced-fat mayonnaise and salad dressings (reduced sodium). Avocado. Safflower, olive, or canola oils. Natural peanut or almond butter. Other  Unsalted popcorn and pretzels. The items listed above may not be a complete list of recommended foods or  beverages. Contact your dietitian for more options.  What foods are not recommended? Grains  White bread. White pasta. White rice. Refined cornbread. Bagels and croissants.  Crackers that contain trans fat. Vegetables  Creamed or fried vegetables. Vegetables in a cheese sauce. Regular canned vegetables. Regular canned tomato sauce and paste. Regular tomato and vegetable juices. Fruits  Canned fruit in light or heavy syrup. Fruit juice. Meat and Other Protein Products  Fatty cuts of meat. Ribs, chicken wings, bacon, sausage, bologna, salami, chitterlings, fatback, hot dogs, bratwurst, and packaged luncheon meats. Salted nuts and seeds. Canned beans with salt. Dairy  Whole or 2% milk, cream, half-and-half, and cream cheese. Whole-fat or sweetened yogurt. Full-fat cheeses or blue cheese. Nondairy creamers and whipped toppings. Processed cheese, cheese spreads, or cheese curds. Condiments  Onion and garlic salt, seasoned salt, table salt, and sea salt. Canned and packaged gravies. Worcestershire sauce. Tartar sauce. Barbecue sauce. Teriyaki sauce. Soy sauce, including reduced sodium. Steak sauce. Fish sauce. Oyster sauce. Cocktail sauce. Horseradish. Ketchup and mustard. Meat flavorings and tenderizers. Bouillon cubes. Hot sauce. Tabasco sauce. Marinades. Taco seasonings. Relishes. Fats and Oils  Butter, stick margarine, lard, shortening, ghee, and bacon fat. Coconut, palm kernel, or palm oils. Regular salad dressings. Other  Pickles and olives. Salted popcorn and pretzels. The items listed above may not be a complete list of foods and beverages to avoid. Contact your dietitian for more information.  Where can I find more information? National Heart, Lung, and Blood Institute: travelstabloid.com This information is not intended to replace advice given to you by your health care provider. Make sure you discuss any questions you have with your health care provider. Document Released: 07/22/2011 Document Revised: 01/08/2016 Document Reviewed: 06/06/2013 Elsevier Interactive Patient Education  2017 [place] K. Kayliah Tindol  M.D.

## 2016-10-05 NOTE — Telephone Encounter (Signed)
Noted  

## 2016-10-05 NOTE — Telephone Encounter (Signed)
Patient Name: [name] [facility]  DOB: [birth year]    Initial Comment Caller says she has been having trouble catching her breath over the last week or so. Her BP has been high at night. The shortness of breath happens at random times.    Nurse Assessment  Nurse: Thad Ranger RN, Langley Gauss Date/Time (Eastern Time): 10/05/2016 4:30:36 PM  Confirm and document reason for call. If symptomatic, describe symptoms. ---Caller says she has been having trouble catching her breath over the last week or so. Her BP has been high at night. The shortness of breath happens at random times. Denies SOB or HTN at this time.  Does the patient have any new or worsening symptoms? ---Yes  Will a triage be completed? ---Yes  Related visit to physician within the last 2 weeks? ---No  Does the PT have any chronic conditions? (i.e. diabetes, asthma, etc.) ---Yes  List chronic conditions. ---HTN  Is the patient pregnant or possibly pregnant? (Ask all females between the ages of 75-55) ---No  Is this a behavioral health or substance abuse call? ---No     Guidelines    Guideline Title Affirmed Question Affirmed Notes  Breathing Difficulty [1] MODERATE longstanding difficulty breathing (e.g., speaks in phrases, SOB even at rest, pulse 100-120) AND [2] SAME as normal    Final Disposition User   See PCP When Office is Open (within 3 days) Carmon, RN, Langley Gauss    Comments  Pt w/o long standing SOB, but over the last wk or more, she has had intermittent episodes with SOB w/activity. States she wants to be seen by the MD.  Appt made with Dr Shanon Ace on Thursday (per pt request), at 0915 am. Pt aware/agreeable.   Referrals  REFERRED TO PCP OFFICE  REFERRED TO PCP OFFICE   Disagree/Comply: Comply

## 2016-10-07 ENCOUNTER — Encounter: Payer: Self-pay | Admitting: Internal Medicine

## 2016-10-07 ENCOUNTER — Ambulatory Visit (INDEPENDENT_AMBULATORY_CARE_PROVIDER_SITE_OTHER)
Admission: RE | Admit: 2016-10-07 | Discharge: 2016-10-07 | Disposition: A | Discharge status: Home / Self Care | Payer: BLUE CROSS/BLUE SHIELD | Source: Ambulatory Visit | Attending: Internal Medicine | Admitting: Internal Medicine

## 2016-10-07 ENCOUNTER — Ambulatory Visit (INDEPENDENT_AMBULATORY_CARE_PROVIDER_SITE_OTHER): Payer: BLUE CROSS/BLUE SHIELD | Admitting: Internal Medicine

## 2016-10-07 VITALS — BP 142/102 | HR 82 | Temp 98.3°F | Ht 73.0 in | Wt 245.2 lb

## 2016-10-07 DIAGNOSIS — I10 Essential (primary) hypertension: Secondary | ICD-10-CM | POA: Diagnosis not present

## 2016-10-07 DIAGNOSIS — R0602 Shortness of breath: Secondary | ICD-10-CM | POA: Diagnosis not present

## 2016-10-07 DIAGNOSIS — M79604 Pain in right leg: Secondary | ICD-10-CM | POA: Diagnosis not present

## 2016-10-07 LAB — LIPID PANEL
CHOLESTEROL: 219 mg/dL — AB (ref 0–200)
HDL: 40.8 mg/dL (ref >39.00)
LDL Cholesterol: 142 mg/dL — ABNORMAL HIGH (ref 0–99)
NonHDL: 178.61
Total CHOL/HDL Ratio: 5
Triglycerides: 184 mg/dL — ABNORMAL HIGH (ref 0.0–149.0)
VLDL: 36.8 mg/dL (ref 0.0–40.0)

## 2016-10-07 LAB — CBC WITH DIFFERENTIAL/PLATELET
BASOS PCT: 0.5 % (ref 0.0–3.0)
Basophils Absolute: 0 10*3/uL (ref 0.0–0.1)
EOS PCT: 1.5 % (ref 0.0–5.0)
Eosinophils Absolute: 0.1 10*3/uL (ref 0.0–0.7)
HCT: 42.1 % (ref 36.0–46.0)
Hemoglobin: 14.3 g/dL (ref 12.0–15.0)
LYMPHS ABS: 2.5 10*3/uL (ref 0.7–4.0)
Lymphocytes Relative: 42.4 % (ref 12.0–46.0)
MCHC: 33.9 g/dL (ref 30.0–36.0)
MCV: 89.2 fl (ref 78.0–100.0)
Monocytes Absolute: 0.4 10*3/uL (ref 0.1–1.0)
Monocytes Relative: 6.5 % (ref 3.0–12.0)
NEUTROS ABS: 2.9 10*3/uL (ref 1.4–7.7)
NEUTROS PCT: 49.1 % (ref 43.0–77.0)
PLATELETS: 244 10*3/uL (ref 150.0–400.0)
RBC: 4.72 Mil/uL (ref 3.87–5.11)
RDW: 13.7 % (ref 11.5–15.5)
WBC: 5.9 10*3/uL (ref 4.0–10.5)

## 2016-10-07 LAB — TSH: TSH: 1.02 u[IU]/mL (ref 0.35–4.50)

## 2016-10-07 LAB — HEPATIC FUNCTION PANEL
ALBUMIN: 4.5 g/dL (ref 3.5–5.2)
ALT: 22 U/L (ref 0–35)
AST: 16 U/L (ref 0–37)
Alkaline Phosphatase: 88 U/L (ref 39–117)
Bilirubin, Direct: 0.2 mg/dL (ref 0.0–0.3)
Total Bilirubin: 1.2 mg/dL (ref 0.2–1.2)
Total Protein: 7.3 g/dL (ref 6.0–8.3)

## 2016-10-07 LAB — BASIC METABOLIC PANEL
BUN: 16 mg/dL (ref 6–23)
CALCIUM: 9.7 mg/dL (ref 8.4–10.5)
CHLORIDE: 103 meq/L (ref 96–112)
CO2: 30 meq/L (ref 19–32)
Creatinine, Ser: 0.72 mg/dL (ref 0.40–1.20)
GFR: 111.99 mL/min (ref >60.00)
GLUCOSE: 102 mg/dL — AB (ref 70–99)
POTASSIUM: 3.8 meq/L (ref 3.5–5.1)
SODIUM: 140 meq/L (ref 135–145)

## 2016-10-07 LAB — SEDIMENTATION RATE: SED RATE: 10 mm/h (ref 0–20)

## 2016-10-07 LAB — T4, FREE: Free T4: 0.88 ng/dL (ref 0.60–1.60)

## 2016-10-07 LAB — D-DIMER, QUANTITATIVE (NOT AT ARMC): D DIMER QUANT: 0.23 ug{FEU}/mL (ref <0.50)

## 2016-10-07 IMAGING — DX DG CHEST 2V
2 series · 2 of 2 positions shown · non-contrast
Comparison: [DATE]

CLINICAL DATA: Dyspnea for 1 month

EXAM:
CHEST  2 VIEW

[chest pa]
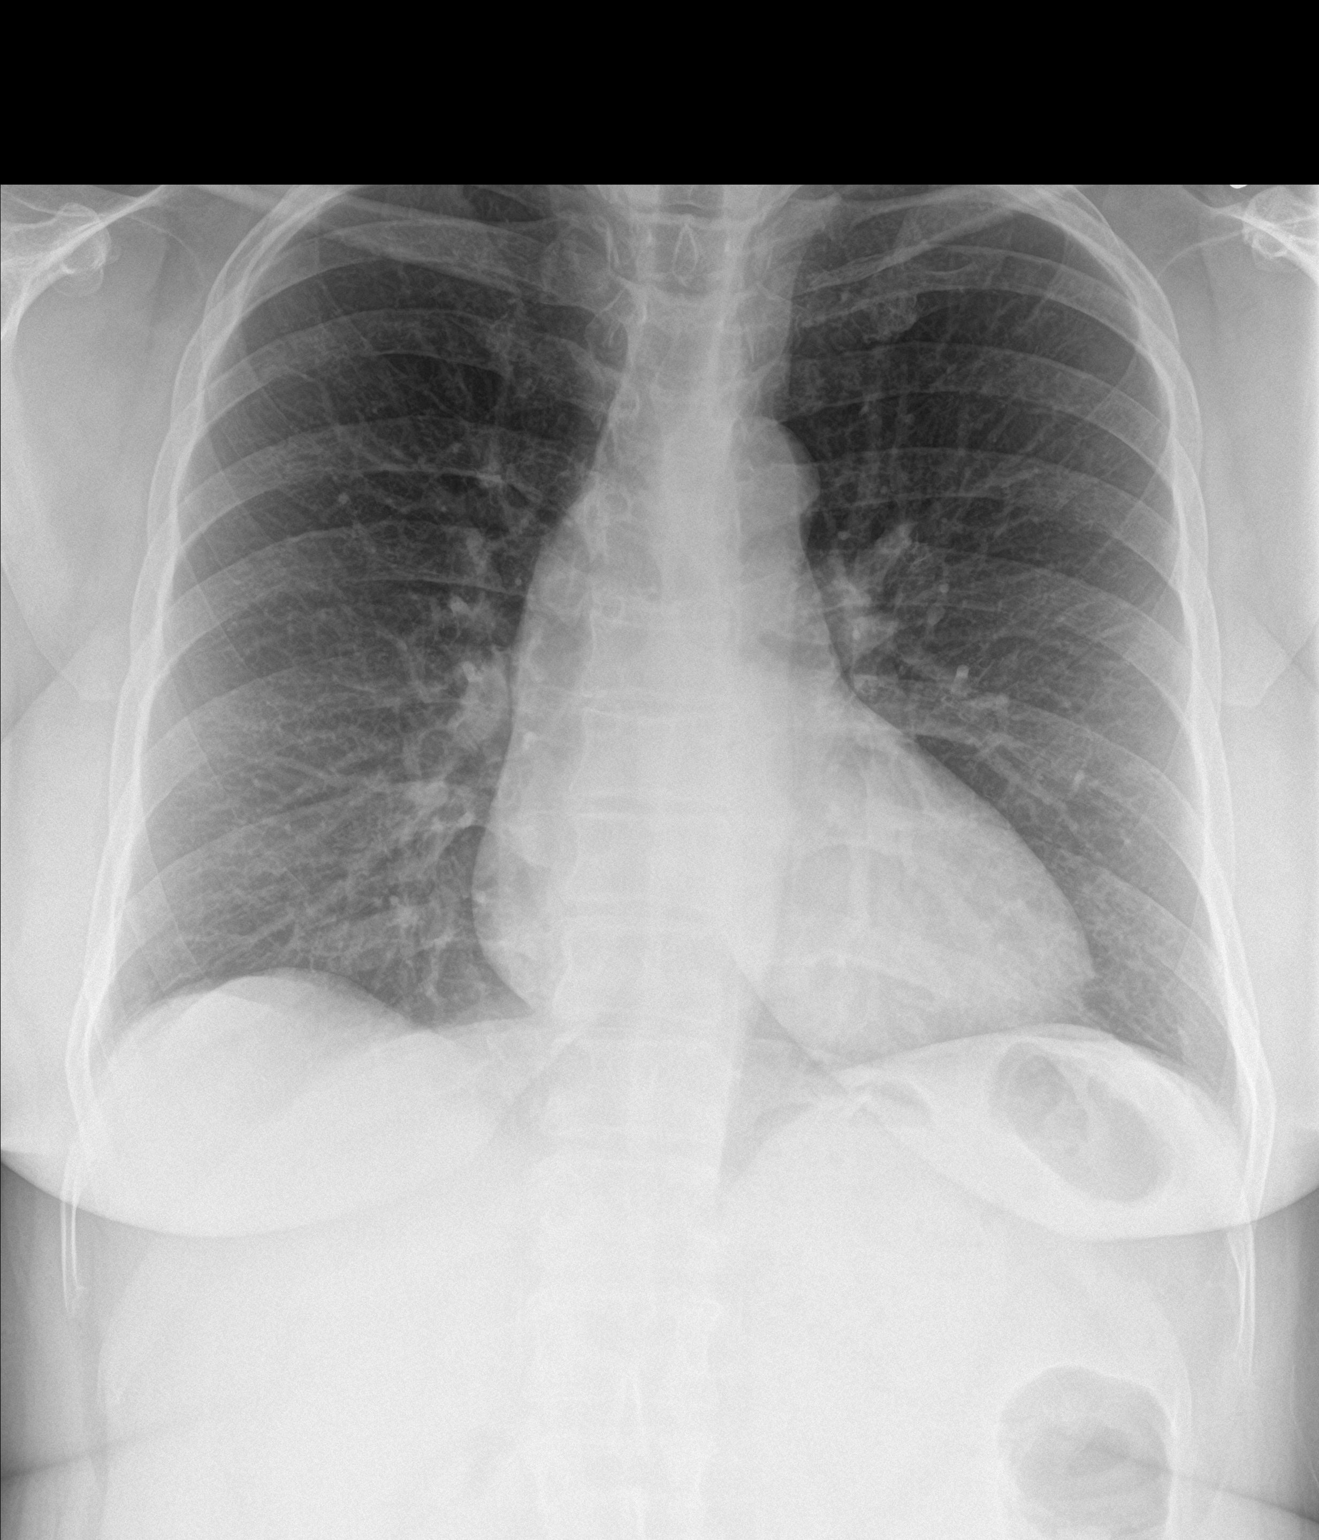

[chest lat]
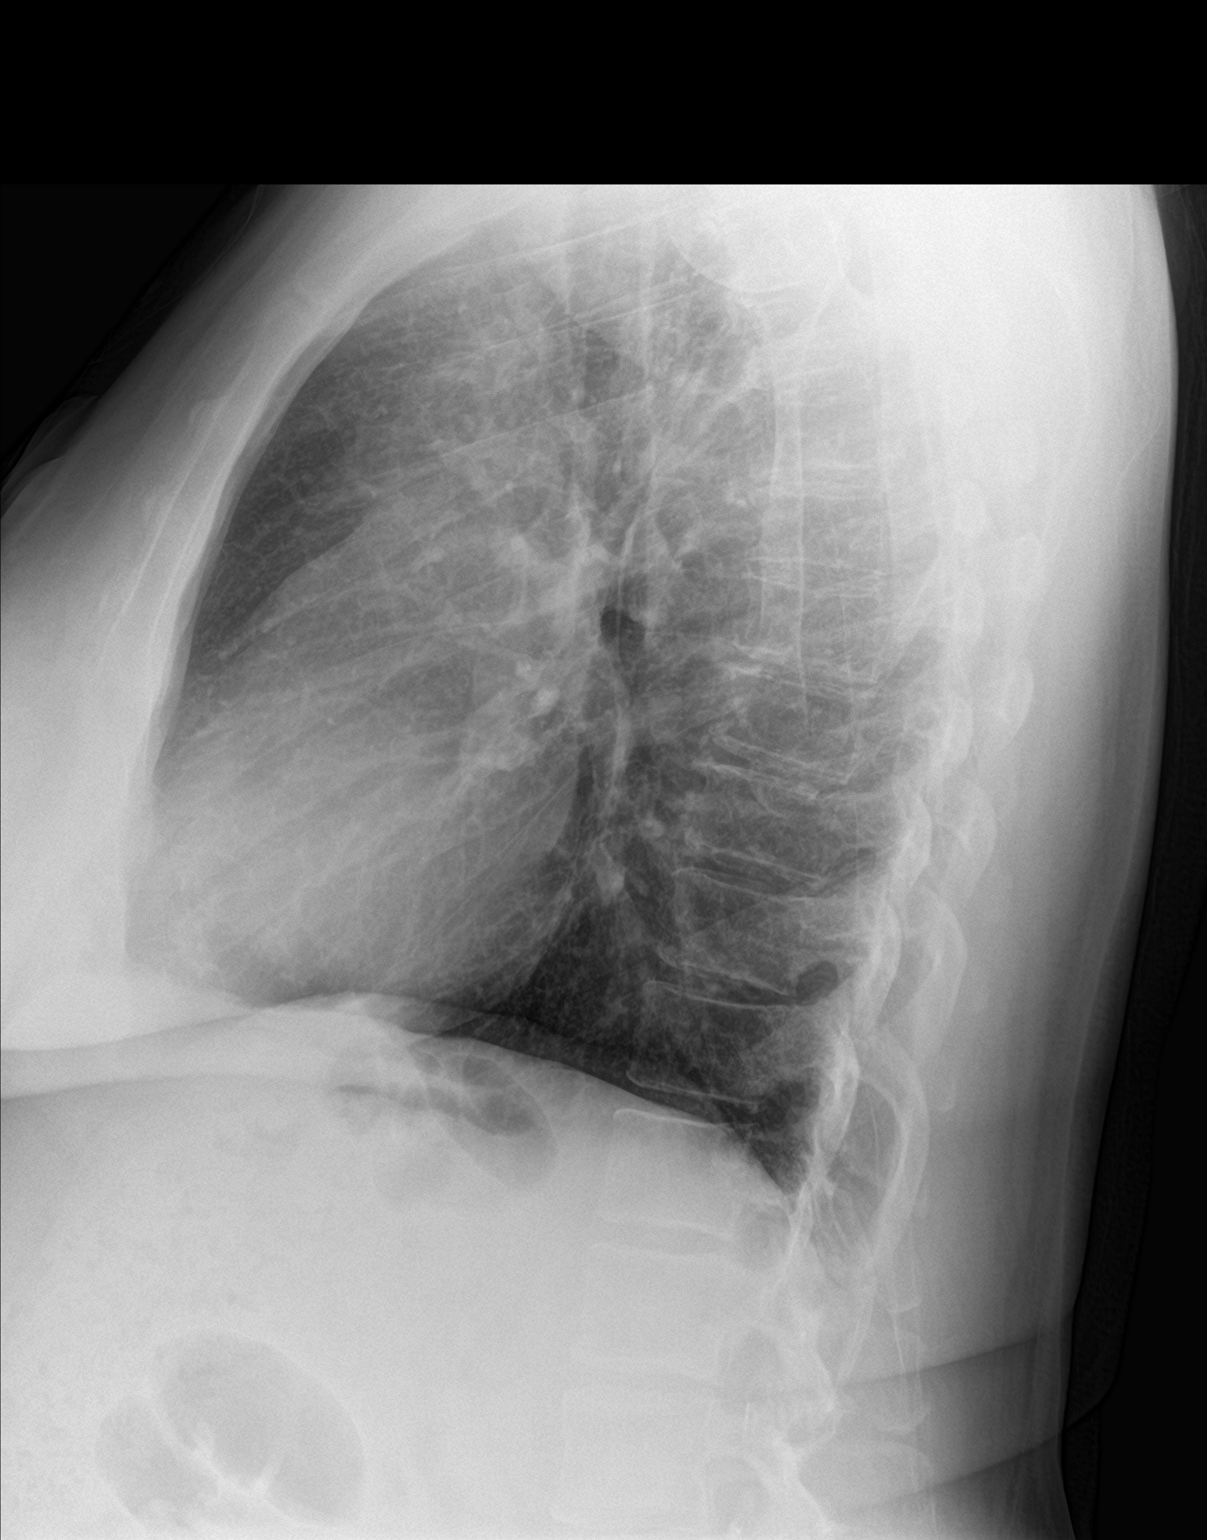

[2 of 2 positions shown; findings below may reference images not displayed]

FINDINGS: The heart size and mediastinal contours are within normal limits.
Both lungs are clear. No acute or aggressive osseous finding.
IMPRESSION: Stable.  No evidence of active disease.

## 2016-10-07 MED ORDER — OLMESARTAN-AMLODIPINE-HCTZ 40-5-25 MG PO TABS: 1.0000 | ORAL_TABLET | Freq: Every day | ORAL | 1 refills | Status: DC

## 2016-10-07 NOTE — Patient Instructions (Addendum)
Checking blood work today chest x-ray Not sure about the right leg I don't see swollen but sometimes we get more evaluation. Such as a Doppler test. Make an appointment for 2-3 weeks from now and bring in your blood pressure machine with readings we'll decide if we need to change her add to your medicine. There could be many causes of your symptoms will try to get to the bottom of it over time. Your exam is reassuring today except her blood pressure is up modestly. Take blood pressure readings twice a day for 10-14 days  and then periodically . Bring in the readings pleasure monitor to your next visit.    DASH Eating Plan DASH stands for "Dietary Approaches to Stop Hypertension." The DASH eating plan is a healthy eating plan that has been shown to reduce high blood pressure (hypertension). Additional health benefits may include reducing the risk of type 2 diabetes mellitus, heart disease, and stroke. The DASH eating plan may also help with weight loss. What do I need to know about the DASH eating plan? For the DASH eating plan, you will follow these general guidelines:  Choose foods with less than 150 milligrams of sodium per serving (as listed on the food label).  Use salt-free seasonings or herbs instead of table salt or sea salt.  Check with your health care provider or pharmacist before using salt substitutes.  Eat lower-sodium products. These are often labeled as "low-sodium" or "no salt added."  Eat fresh foods. Avoid eating a lot of canned foods.  Eat more vegetables, fruits, and low-fat dairy products.  Choose whole grains. Look for the word "whole" as the first word in the ingredient list.  Choose fish and skinless chicken or [place] more often than red meat. Limit fish, poultry, and meat to 6 oz (170 g) each day.  Limit sweets, desserts, sugars, and sugary drinks.  Choose heart-healthy fats.  Eat more home-cooked food and less restaurant, buffet, and fast food.  Limit  fried foods.  Do not fry foods. Cook foods using methods such as baking, boiling, grilling, and broiling instead.  When eating at a restaurant, ask that your food be prepared with less salt, or no salt if possible. What foods can I eat? Seek help from a dietitian for individual calorie needs. Grains  Whole grain or whole wheat bread. Brown rice. Whole grain or whole wheat pasta. Quinoa, bulgur, and whole grain cereals. Low-sodium cereals. Corn or whole wheat flour tortillas. Whole grain cornbread. Whole grain crackers. Low-sodium crackers. Vegetables  Fresh or frozen vegetables (raw, steamed, roasted, or grilled). Low-sodium or reduced-sodium tomato and vegetable juices. Low-sodium or reduced-sodium tomato sauce and paste. Low-sodium or reduced-sodium canned vegetables. Fruits  All fresh, canned (in natural juice), or frozen fruits. Meat and Other Protein Products  Ground beef (85% or leaner), grass-fed beef, or beef trimmed of fat. Skinless chicken or [place]. Ground chicken or [place]. Pork trimmed of fat. All fish and seafood. Eggs. Dried beans, peas, or lentils. Unsalted nuts and seeds. Unsalted canned beans. Dairy  Low-fat dairy products, such as skim or 1% milk, 2% or reduced-fat cheeses, low-fat ricotta or cottage cheese, or plain low-fat yogurt. Low-sodium or reduced-sodium cheeses. Fats and Oils  Tub margarines without trans fats. Light or reduced-fat mayonnaise and salad dressings (reduced sodium). Avocado. Safflower, olive, or canola oils. Natural peanut or almond butter. Other  Unsalted popcorn and pretzels. The items listed above may not be a complete list of recommended foods or beverages. Contact your  dietitian for more options.  What foods are not recommended? Grains  White bread. White pasta. White rice. Refined cornbread. Bagels and croissants. Crackers that contain trans fat. Vegetables  Creamed or fried vegetables. Vegetables in a cheese sauce. Regular canned vegetables.  Regular canned tomato sauce and paste. Regular tomato and vegetable juices. Fruits  Canned fruit in light or heavy syrup. Fruit juice. Meat and Other Protein Products  Fatty cuts of meat. Ribs, chicken wings, bacon, sausage, bologna, salami, chitterlings, fatback, hot dogs, bratwurst, and packaged luncheon meats. Salted nuts and seeds. Canned beans with salt. Dairy  Whole or 2% milk, cream, half-and-half, and cream cheese. Whole-fat or sweetened yogurt. Full-fat cheeses or blue cheese. Nondairy creamers and whipped toppings. Processed cheese, cheese spreads, or cheese curds. Condiments  Onion and garlic salt, seasoned salt, table salt, and sea salt. Canned and packaged gravies. Worcestershire sauce. Tartar sauce. Barbecue sauce. Teriyaki sauce. Soy sauce, including reduced sodium. Steak sauce. Fish sauce. Oyster sauce. Cocktail sauce. Horseradish. Ketchup and mustard. Meat flavorings and tenderizers. Bouillon cubes. Hot sauce. Tabasco sauce. Marinades. Taco seasonings. Relishes. Fats and Oils  Butter, stick margarine, lard, shortening, ghee, and bacon fat. Coconut, palm kernel, or palm oils. Regular salad dressings. Other  Pickles and olives. Salted popcorn and pretzels. The items listed above may not be a complete list of foods and beverages to avoid. Contact your dietitian for more information.  Where can I find more information? National Heart, Lung, and Blood Institute: travelstabloid.com This information is not intended to replace advice given to you by your health care provider. Make sure you discuss any questions you have with your health care provider. Document Released: 07/22/2011 Document Revised: 01/08/2016 Document Reviewed: 06/06/2013 Elsevier Interactive Patient Education  2017 [employer].

## 2016-10-21 NOTE — Progress Notes (Signed)
Chief Complaint  Patient presents with  . Hypertension    HPI: [name] 46 y.o. come in for Chronic disease management  Fu [name]  comes in today for follow up of  multiple medical problems.  See last visit :   HT taking medication daily without side effect. Notices her readings are better after taking medicine the first thing in the morning may be up in the 1 5110 range. Denies a side effect.  SOB no change no acute redness.  Choking I middle of night continues and husband says she snores no family history of sleep apnea.   ocass reflux   Right shoulder is been bothersome worse at night pain with elevation no specific injury occasional anti-inflammatory but prefers not to take medication.  Has blood pressure monitor that is a wrist cuff.  ROS: See pertinent positives and negatives per HPI. No syncope .   Past Medical History:  Diagnosis Date  . Hypertension   . Hypoglycemia   . Radiation 10/21/2015-10/23/2015   Anterior neck area 12 gray in 3 fractions  . Teratoma     Family History  Problem Relation Age of Onset  . Hypertension Other     Social History   Social History  . Marital status: Married    Spouse name: N/A  . Number of children: 2  . Years of education: N/A   Occupational History  . Therapist, music    Social History Main Topics  . Smoking status: Never Smoker  . Smokeless tobacco: Never Used  . Alcohol use Yes     Comment: socially  . Drug use: No  . Sexual activity: Yes    Birth control/ protection: Surgical   Other Topics Concern  . None   Social History Narrative  . None    Outpatient Medications Prior to Visit  Medication Sig Dispense Refill  . Chromium 200 MCG CAPS Take 200 mcg by mouth daily. Reported on 10/23/2015    . co-enzyme Q-10 30 MG capsule Take 30 mg by mouth 3 (three) times daily.    . Olmesartan-Amlodipine-HCTZ 40-5-25 MG TABS Take 1 tablet by mouth daily. 90 tablet 1   No facility-administered  medications prior to visit.      EXAM:  BP 106/68 (BP Location: Right Arm, Patient Position: Sitting, Cuff Size: Normal)   Pulse (!) 107   Temp 97 F (36.1 C) (Oral)   Ht 6\' 1"  (1.854 m)   Wt 245 lb 3.2 oz (111.2 kg)   BMI 32.35 kg/m   Body mass index is 32.35 kg/m. Repeat bp was 117/78 righ tarm large cuff.  GENERAL: vitals reviewed and listed above, alert, oriented, appears well hydrated and in no acute distress HEENT: atraumatic, conjunctiva  clear, no obvious abnormalities on inspection of external nose and ears OP : no lesion edema or exudate  NECK:  Well healed scar  ? If full  [place] notch  No jvd  LUNGS: clear to auscultation bilaterally, no wheezes, rales or rhonchi, t CV: HRRR, no clubbing cyanosis or  1+  peripheral edema nl cap refill  MS: moves all extremities without noticeable focal  Abnormality  Dec rom right shoulder   90 Degree PSYCH: pleasant and cooperative, no obvious depression or anxiety Lab Results  Component Value Date   WBC 5.9 10/07/2016   HGB 14.3 10/07/2016   HCT 42.1 10/07/2016   PLT 244.0 10/07/2016   GLUCOSE 102 (H) 10/07/2016   CHOL 219 (H) 10/07/2016  TRIG 184.0 (H) 10/07/2016   HDL 40.80 10/07/2016   LDLDIRECT 177.3 07/06/2013   LDLCALC 142 (H) 10/07/2016   ALT 22 10/07/2016   AST 16 10/07/2016   NA 140 10/07/2016   K 3.8 10/07/2016   CL 103 10/07/2016   CREATININE 0.72 10/07/2016   BUN 16 10/07/2016   CO2 30 10/07/2016   TSH 1.02 10/07/2016   BP Readings from Last 3 Encounters:  10/22/16 106/68  10/07/16 (!) 142/102  11/27/15 (!) 152/86   Wt Readings from Last 3 Encounters:  10/22/16 245 lb 3.2 oz (111.2 kg)  10/07/16 245 lb 3.2 oz (111.2 kg)  11/27/15 250 lb 14.4 oz (113.8 kg)  reviewed lab with patient   ASSESSMENT AND PLAN:  Discussed the following assessment and plan:  Essential hypertension - Plan: ECHOCARDIOGRAM COMPLETE  Shortness of breath - pulm referral and echo card to check  - Plan: ECHOCARDIOGRAM COMPLETE,  Ambulatory referral to Pulmonology  Nocturnal dyspnea/choking   - snoring suspect OSa but could be other anatomic hx neck surgery radiation  ?  frefer to pulm of eval.? osa and sob - Plan: ECHOCARDIOGRAM COMPLETE, Ambulatory referral to Pulmonology  Snoring - Plan: Ambulatory referral to Pulmonology  Right shoulder pain, unspecified chronicity Can try adding zantac 150 bid if needed  -Patient advised to return or notify health care team  if  new concerns arise.  Patient Instructions  Pressure is getting better. You can take the medicine at night you get lightheaded after taking medication. He will be contacted about an appointment with pulmonary clinicians to our sleep specialists and can address the nighttime choking and shortness of breath. He will be contacted about an appointment to get an echocardiogram which is a sound wave test to the heart.  Plan follow-up visit in about 2 months. Let us know in the meantime if you have problems unexpected. Sometimes choking can be  mechanical problem on the trachea such as a thyroid or such but I will leave that up to the lung doctors as to further evaluate.  Can try a topical anti-inflammatory to your right shoulder up to 4 times a day. If not improving in the next 1-2 weeks contact us for sports medicine referral.          Standley Brooking. Panosh M.D.

## 2016-10-22 ENCOUNTER — Encounter: Payer: Self-pay | Admitting: Internal Medicine

## 2016-10-22 ENCOUNTER — Ambulatory Visit (INDEPENDENT_AMBULATORY_CARE_PROVIDER_SITE_OTHER): Payer: BLUE CROSS/BLUE SHIELD | Admitting: Internal Medicine

## 2016-10-22 VITALS — BP 106/68 | HR 107 | Temp 97.0°F | Ht 73.0 in | Wt 245.2 lb

## 2016-10-22 DIAGNOSIS — M25511 Pain in right shoulder: Secondary | ICD-10-CM

## 2016-10-22 DIAGNOSIS — R0683 Snoring: Secondary | ICD-10-CM

## 2016-10-22 DIAGNOSIS — R0600 Dyspnea, unspecified: Secondary | ICD-10-CM | POA: Diagnosis not present

## 2016-10-22 DIAGNOSIS — I10 Essential (primary) hypertension: Secondary | ICD-10-CM

## 2016-10-22 DIAGNOSIS — R0602 Shortness of breath: Secondary | ICD-10-CM | POA: Diagnosis not present

## 2016-10-22 MED ORDER — DICLOFENAC SODIUM 1 % TD GEL: 4.0000 g | Freq: Four times a day (QID) | TRANSDERMAL | 1 refills | Status: DC

## 2016-10-22 NOTE — Patient Instructions (Addendum)
Pressure is getting better. You can take the medicine at night you get lightheaded after taking medication. He will be contacted about an appointment with pulmonary clinicians to our sleep specialists and can address the nighttime choking and shortness of breath. He will be contacted about an appointment to get an echocardiogram which is a sound wave test to the heart.  Plan follow-up visit in about 2 months. Let us know in the meantime if you have problems unexpected. Sometimes choking can be  mechanical problem on the trachea such as a thyroid or such but I will leave that up to the lung doctors as to further evaluate.  Can try a topical anti-inflammatory to your right shoulder up to 4 times a day. If not improving in the next 1-2 weeks contact us for sports medicine referral.

## 2016-11-10 ENCOUNTER — Other Ambulatory Visit (HOSPITAL_COMMUNITY): Payer: BLUE CROSS/BLUE SHIELD

## 2016-11-19 ENCOUNTER — Ambulatory Visit (HOSPITAL_COMMUNITY): Payer: BLUE CROSS/BLUE SHIELD

## 2016-11-19 ENCOUNTER — Telehealth (HOSPITAL_COMMUNITY): Payer: Self-pay | Admitting: Internal Medicine

## 2016-11-22 NOTE — Telephone Encounter (Signed)
  11/19/2016 11:33 AM Phone ([place]) Owen, Theresa R (Self) [phone] (W)   Left Message - Called pt and lmsg to see if she could come in for the 11:30 per tech. Someone fell off of the schedule.     By Verdene Rio           Close PreviousNext

## 2016-11-29 ENCOUNTER — Encounter: Payer: Self-pay | Admitting: Internal Medicine

## 2016-12-24 ENCOUNTER — Encounter: Payer: Self-pay | Admitting: Pulmonary Disease

## 2016-12-24 ENCOUNTER — Ambulatory Visit (INDEPENDENT_AMBULATORY_CARE_PROVIDER_SITE_OTHER): Payer: BLUE CROSS/BLUE SHIELD | Admitting: Pulmonary Disease

## 2016-12-24 VITALS — BP 124/78 | HR 75 | Resp 16 | Ht 72.0 in | Wt 245.0 lb

## 2016-12-24 DIAGNOSIS — R0683 Snoring: Secondary | ICD-10-CM

## 2016-12-24 DIAGNOSIS — R0609 Other forms of dyspnea: Secondary | ICD-10-CM | POA: Diagnosis not present

## 2016-12-24 NOTE — Progress Notes (Signed)
Past Surgical History She  has a past surgical history that includes Abdominal hysterectomy; Cesarean section; Dermoid cyst  excision; Hand surgery; Ectopic pregnancy surgery; Cryotherapy (2013); Abdominal hernia repair; and Excision mass neck (Bilateral, 10/20/2015).  Allergies  Allergen Reactions  . Tylox [Oxycodone-Acetaminophen] Swelling    Caused face to swell    Family History Her family history includes Hypertension in her other.  Social History She  reports that she has never smoked. She has never used smokeless tobacco. She reports that she drinks alcohol. She reports that she does not use drugs.  Review of systems Constitutional: Negative for fever and unexpected weight change.  HENT: Positive for dental problem. Negative for congestion, ear pain, nosebleeds, postnasal drip, rhinorrhea, sinus pressure, sneezing, sore throat and trouble swallowing.   Eyes: Negative for redness and itching.  Respiratory: Positive for shortness of breath. Negative for cough, chest tightness and wheezing.   Cardiovascular: Positive for chest pain. Negative for palpitations and leg swelling.  Gastrointestinal: Negative for nausea and vomiting.       Indigestion  Genitourinary: Negative for dysuria.  Musculoskeletal: Positive for arthralgias and joint swelling.  Skin: Positive for rash ( itching).  Neurological: Positive for headaches.  Hematological: Does not bruise/bleed easily.  Psychiatric/Behavioral: Negative for dysphoric mood. The patient is not nervous/anxious.     Current Outpatient Prescriptions on File Prior to Visit  Medication Sig  . Chromium 200 MCG CAPS Take 200 mcg by mouth daily. Reported on 10/23/2015  . co-enzyme Q-10 30 MG capsule Take 30 mg by mouth 3 (three) times daily.  . diclofenac sodium (VOLTAREN) 1 % GEL Apply 4 g topically 4 (four) times daily. To area as directed for joint pain.  . Olmesartan-Amlodipine-HCTZ 40-5-25 MG TABS Take 1 tablet by mouth daily.   No current  facility-administered medications on file prior to visit.     Chief Complaint  Patient presents with  . PULMONARY CONSULT    patient is for a pulmonary consult from Dr Regis Bill. Request handicap placard    Past medical history She  has a past medical history of Hypertension; Hypoglycemia; Radiation (10/21/2015-10/23/2015); and Teratoma.  Vital signs BP 124/78 (BP Location: Right Arm, Cuff Size: Normal)   Pulse 75   Resp 16   Ht 6' (1.829 m)   Wt 245 lb (111.1 kg)   SpO2 96%   BMI 33.23 kg/m   History of Present Illness [name] is a 46 y.o. female for evaluation of sleep problems and dyspnea.  She has noticed trouble with her breathing for a while.  She gets winded easily with walking.  She denies cough, wheeze, or sputum.  No history of asthma.  She gets intermittent leg swelling.  Denies history of thromboembolic disease.  She had lab tests and CXR in February >> unremarkable.  She is scheduled for echocardiogram.  She is on 3 medications for her blood pressure.  She does snore, and wakes up feeling like she has trouble breathing at night.  She is tired during the day, and can fall asleep when sitting quiet.  Epworth score 16 out of 24.  Physical Exam:  General - No distress ENT - No sinus tenderness, no oral exudate, no LAN, no thyromegaly, TM clear, pupils equal/reactive Cardiac - s1s2 regular, no murmur, pulses symmetric Chest - No wheeze/rales/dullness, good air entry, normal respiratory excursion Back - No focal tenderness Abd - Soft, non-tender, no organomegaly, + bowel sounds Ext - No edema Neuro - Normal strength, cranial nerves intact  Skin - No rashes Psych - Normal mood, and behavior  Dg Chest 2 View  Result Date: 10/07/2016 CLINICAL DATA:  Dyspnea for 1 month EXAM: CHEST  2 VIEW COMPARISON:  01/05/2015 FINDINGS: The heart size and mediastinal contours are within normal limits. Both lungs are clear. No acute or aggressive osseous finding. IMPRESSION:  Stable.  No evidence of active disease. Electronically Signed   By: Monte Fantasia M.D.   On: 10/07/2016 11:58   CMP Latest Ref Rng & Units 10/07/2016 10/17/2015 01/05/2015  Glucose 70 - 99 mg/dL 102(H) 122(H) 169(H)  BUN 6 - 23 mg/dL 16 16 17   Creatinine 0.40 - 1.20 mg/dL 0.72 0.78 0.82  Sodium 135 - 145 mEq/L 140 142 137  Potassium 3.5 - 5.1 mEq/L 3.8 3.7 3.3(L)  Chloride 96 - 112 mEq/L 103 100(L) 102  CO2 19 - 32 mEq/L 30 28 26   Calcium 8.4 - 10.5 mg/dL 9.7 10.1 9.6  Total Protein 6.0 - 8.3 g/dL 7.3 - -  Total Bilirubin 0.2 - 1.2 mg/dL 1.2 - -  Alkaline Phos 39 - 117 U/L 88 - -  AST 0 - 37 U/L 16 - -  ALT 0 - 35 U/L 22 - -    CBC Latest Ref Rng & Units 10/07/2016 01/05/2015 10/28/2014  WBC 4.0 - 10.5 K/uL 5.9 8.3 9.8  Hemoglobin 12.0 - 15.0 g/dL 14.3 14.3 14.9  Hematocrit 36.0 - 46.0 % 42.1 41.9 43.0  Platelets 150.0 - 400.0 K/uL 244.0 268 273    Lab Results  Component Value Date   TSH 1.02 10/07/2016     Discussion: She has dyspnea on exertion and refractory hypertension.  She has snoring, sleep disruption, and apnea.  She could have diastolic dysfunction exacerbated by sleep apnea as a contributor to her symptoms.  Assessment/plan:  Dyspnea on exertion. - f/u Echo - will get PFT - depending on results will determine if additional pulmonary testing is needed  Snoring. - will arrange for in lab sleep study   Patient Instructions  Will arrange for pulmonary function test and sleep study  Follow up in 6 to 8 weeks with Dr. Halford Chessman or Nurse Practitioner    Chesley Mires, M.D. Pager [phone] 12/24/2016, 12:29 PM

## 2016-12-24 NOTE — Progress Notes (Signed)
   Subjective:    Patient ID: [name], female    DOB: [birth year], 46 y.o.   MRN: [id number]  HPI    Review of Systems     Objective:   Physical Exam        Assessment & Plan:

## 2016-12-24 NOTE — Patient Instructions (Signed)
Will arrange for pulmonary function test and sleep study  Follow up in 6 to 8 weeks with Dr. Halford Chessman or Nurse Practitioner

## 2016-12-24 NOTE — Progress Notes (Signed)
   Subjective:    Patient ID: [name], female    DOB: 1970/11/24, 46 y.o.   MRN: [id number]  HPI    Review of Systems  Constitutional: Negative for fever and unexpected weight change.  HENT: Positive for dental problem. Negative for congestion, ear pain, nosebleeds, postnasal drip, rhinorrhea, sinus pressure, sneezing, sore throat and trouble swallowing.   Eyes: Negative for redness and itching.  Respiratory: Positive for shortness of breath. Negative for cough, chest tightness and wheezing.   Cardiovascular: Positive for chest pain. Negative for palpitations and leg swelling.  Gastrointestinal: Negative for nausea and vomiting.       Indigestion  Genitourinary: Negative for dysuria.  Musculoskeletal: Positive for arthralgias and joint swelling.  Skin: Positive for rash ( itching).  Neurological: Positive for headaches.  Hematological: Does not bruise/bleed easily.  Psychiatric/Behavioral: Negative for dysphoric mood. The patient is not nervous/anxious.        Objective:   Physical Exam        Assessment & Plan:

## 2016-12-30 NOTE — Progress Notes (Signed)
Chief Complaint  Patient presents with  . Follow-up    urine frequency, lower back pain    HPI: [name] 46 y.o. come in for Chronic disease management  number or issues    Fu  Ht  Seen dr Halford Chessman for snioring  To ge pfts and sleep study   The wound today and had blood on her keloid this morning. Wonders if that was the cause of some burning anterior chest pain.  Right shoulder is still problematic hard to lift despite using topicals.  Still has some swelling in her feet but not worse.  Over the last week or 2's been having some ear increase urinary frequency and low back pain but no dysuria or urgency incontinence but some urgency.   ROS: See pertinent positives and negatives per HPI. No fever some chills. She is getting hair thinning on the top of her head.  Past Medical History:  Diagnosis Date  . Hypertension   . Hypoglycemia   . Radiation 10/21/2015-10/23/2015   Anterior neck area 12 gray in 3 fractions  . Teratoma     Family History  Problem Relation Age of Onset  . Hypertension Other     Social History   Social History  . Marital status: Married    Spouse name: N/A  . Number of children: 2  . Years of education: N/A   Occupational History  . Therapist, music    Social History Main Topics  . Smoking status: Never Smoker  . Smokeless tobacco: Never Used  . Alcohol use Yes     Comment: socially  . Drug use: No  . Sexual activity: Yes    Birth control/ protection: Surgical   Other Topics Concern  . None   Social History Narrative  . None    Outpatient Medications Prior to Visit  Medication Sig Dispense Refill  . Chromium 200 MCG CAPS Take 200 mcg by mouth daily. Reported on 10/23/2015    . co-enzyme Q-10 30 MG capsule Take 30 mg by mouth 3 (three) times daily.    . diclofenac sodium (VOLTAREN) 1 % GEL Apply 4 g topically 4 (four) times daily. To area as directed for joint pain. 4 Tube 1  . Olmesartan-Amlodipine-HCTZ 40-5-25 MG TABS Take 1  tablet by mouth daily. 90 tablet 1   No facility-administered medications prior to visit.      EXAM:  BP 120/80 (BP Location: Right Arm, Patient Position: Sitting, Cuff Size: Large)   Pulse 82   Temp 98.3 F (36.8 C) (Oral)   Ht 6' (1.829 m)   Wt 249 lb (112.9 kg)   BMI 33.77 kg/m   Body mass index is 33.77 kg/m.  GENERAL: vitals reviewed and listed above, alert, oriented, appears well hydrated and in no acute distress HEENT: atraumatic, conjunctiva  clear, no obvious abnormalities on inspection of external nose and ears  NECK: no obvious masses on inspection palpation  LUNGS: clear to auscultation bilaterally, no wheezes, rales or rhonchi, good air movement CV: HRRR, no clubbing cyanosis orslight peripheral edema nl cap refill  MS: moves all extremities  decreased range of motion without pain of her right shoulder elevation past 90. Skin keloids anterior chest has a small area of bright red blood without any other change scars on her face and neck although look very good from baseline do have some early keloid formation.  PSYCH: pleasant and cooperative, no obvious depression or anxiety Lab Results  Component Value Date   WBC  5.9 10/07/2016   HGB 14.3 10/07/2016   HCT 42.1 10/07/2016   PLT 244.0 10/07/2016   GLUCOSE 102 (H) 10/07/2016   CHOL 219 (H) 10/07/2016   TRIG 184.0 (H) 10/07/2016   HDL 40.80 10/07/2016   LDLDIRECT 177.3 07/06/2013   LDLCALC 142 (H) 10/07/2016   ALT 22 10/07/2016   AST 16 10/07/2016   NA 140 10/07/2016   K 3.8 10/07/2016   CL 103 10/07/2016   CREATININE 0.72 10/07/2016   BUN 16 10/07/2016   CO2 30 10/07/2016   TSH 1.02 10/07/2016   BP Readings from Last 3 Encounters:  12/31/16 120/80  12/24/16 124/78  10/22/16 106/68   Wt Readings from Last 3 Encounters:  12/31/16 249 lb (112.9 kg)  12/24/16 245 lb (111.1 kg)  10/22/16 245 lb 3.2 oz (111.2 kg)    ASSESSMENT AND PLAN:  Discussed the following assessment and plan:  Frequency of  urination - Plan: POCT Urinalysis Dipstick (Automated), Urine culture, Urine culture  Essential hypertension - Continue same medication work on losing weight decreasing sodium in diet  Right shoulder pain, unspecified chronicity - Plan: Ambulatory referral to Sports Medicine  Shortness of breath  Nocturnal dyspnea/choking   - Under evaluation echo pending  Keloid of skin - w bleeding  BMI 33.0-33.9,adult - dsic goal weight to get below 30 aboutbelow 225 To have echo done end of may  Also further workup in regard to possible sleep apnea and pulmonary function tests for shortness of breath. UA is almost clear culture sent prescription for antibiotic given to hold onto if needed to add but I doubt if that needs to happen Her blood pressure is much better controlled she states in the 120 range at home. With minimal side effects We did discuss her BMI and goal weight would be quite helpful in many of her medical problems. She is chagrined about her keloids but is managing. -Patient advised to return or notify health care team  if  new concerns arise.  Patient Instructions   Stay on same blood pressure medicine as her blood pressure seems to be controlled quite well. Try a over-the-counter Polysporin Naida Sleight the bleeding area of your keloid and a nonstick dressing as you are doing. He will be contacted about appointment with sports medicine regard to your right shoulder problem. We are doing a urine culture on your urine has as very small amount of chemical blood which may be a false positive. If you're getting urinary pain fever burning with urination over the weekend you can begin the antibiotic.  We'll itching no when the urine culture results are back.  Healthy weight loss will be helpful for many other medical problems that you have. Operative mom is a body mass index down to 25 however if you get a body mass index below 30 that is significant for your health. That would be a weight  below 225.  Gt your echocardiogram also  .lsatbp  Lab Results  Component Value Date   WBC 5.9 10/07/2016   HGB 14.3 10/07/2016   HCT 42.1 10/07/2016   PLT 244.0 10/07/2016   GLUCOSE 102 (H) 10/07/2016   CHOL 219 (H) 10/07/2016   TRIG 184.0 (H) 10/07/2016   HDL 40.80 10/07/2016   LDLDIRECT 177.3 07/06/2013   LDLCALC 142 (H) 10/07/2016   ALT 22 10/07/2016   AST 16 10/07/2016   NA 140 10/07/2016   K 3.8 10/07/2016   CL 103 10/07/2016   CREATININE 0.72 10/07/2016   BUN 16 10/07/2016  CO2 30 10/07/2016   TSH 1.02 10/07/2016       Standley Brooking. Tannya Gonet M.D.

## 2016-12-31 ENCOUNTER — Ambulatory Visit (INDEPENDENT_AMBULATORY_CARE_PROVIDER_SITE_OTHER): Payer: BLUE CROSS/BLUE SHIELD | Admitting: Internal Medicine

## 2016-12-31 ENCOUNTER — Encounter: Payer: Self-pay | Admitting: Internal Medicine

## 2016-12-31 VITALS — BP 120/80 | HR 82 | Temp 98.3°F | Ht 72.0 in | Wt 249.0 lb

## 2016-12-31 DIAGNOSIS — R350 Frequency of micturition: Secondary | ICD-10-CM | POA: Diagnosis not present

## 2016-12-31 DIAGNOSIS — L910 Hypertrophic scar: Secondary | ICD-10-CM | POA: Diagnosis not present

## 2016-12-31 DIAGNOSIS — R0602 Shortness of breath: Secondary | ICD-10-CM

## 2016-12-31 DIAGNOSIS — Z6833 Body mass index (BMI) 33.0-33.9, adult: Secondary | ICD-10-CM | POA: Diagnosis not present

## 2016-12-31 DIAGNOSIS — M25511 Pain in right shoulder: Secondary | ICD-10-CM

## 2016-12-31 DIAGNOSIS — R0600 Dyspnea, unspecified: Secondary | ICD-10-CM | POA: Diagnosis not present

## 2016-12-31 DIAGNOSIS — I10 Essential (primary) hypertension: Secondary | ICD-10-CM | POA: Diagnosis not present

## 2016-12-31 LAB — POC URINALSYSI DIPSTICK (AUTOMATED)
BILIRUBIN UA: NEGATIVE
GLUCOSE UA: NEGATIVE
KETONES UA: NEGATIVE
Leukocytes, UA: NEGATIVE
Nitrite, UA: NEGATIVE
Protein, UA: NEGATIVE
Urobilinogen, UA: 0.2 E.U./dL
pH, UA: 6 (ref 5.0–8.0)

## 2016-12-31 MED ORDER — NITROFURANTOIN MONOHYD MACRO 100 MG PO CAPS: 100.0000 mg | ORAL_CAPSULE | Freq: Two times a day (BID) | ORAL | 0 refills | Status: AC

## 2016-12-31 NOTE — Patient Instructions (Addendum)
Stay on same blood pressure medicine as her blood pressure seems to be controlled quite well. Try a over-the-counter Polysporin Naida Sleight the bleeding area of your keloid and a nonstick dressing as you are doing. He will be contacted about appointment with sports medicine regard to your right shoulder problem. We are doing a urine culture on your urine has as very small amount of chemical blood which may be a false positive. If you're getting urinary pain fever burning with urination over the weekend you can begin the antibiotic.  We'll itching no when the urine culture results are back.  Healthy weight loss will be helpful for many other medical problems that you have. Operative mom is a body mass index down to 25 however if you get a body mass index below 30 that is significant for your health. That would be a weight below 225.  Gt your echocardiogram also  .lsatbp  Lab Results  Component Value Date   WBC 5.9 10/07/2016   HGB 14.3 10/07/2016   HCT 42.1 10/07/2016   PLT 244.0 10/07/2016   GLUCOSE 102 (H) 10/07/2016   CHOL 219 (H) 10/07/2016   TRIG 184.0 (H) 10/07/2016   HDL 40.80 10/07/2016   LDLDIRECT 177.3 07/06/2013   LDLCALC 142 (H) 10/07/2016   ALT 22 10/07/2016   AST 16 10/07/2016   NA 140 10/07/2016   K 3.8 10/07/2016   CL 103 10/07/2016   CREATININE 0.72 10/07/2016   BUN 16 10/07/2016   CO2 30 10/07/2016   TSH 1.02 10/07/2016

## 2017-01-01 LAB — URINE CULTURE: Organism ID, Bacteria: NO GROWTH

## 2017-01-07 ENCOUNTER — Ambulatory Visit (HOSPITAL_COMMUNITY): Payer: BLUE CROSS/BLUE SHIELD | Attending: Cardiology

## 2017-01-07 ENCOUNTER — Other Ambulatory Visit: Payer: Self-pay

## 2017-01-07 DIAGNOSIS — R0602 Shortness of breath: Secondary | ICD-10-CM | POA: Diagnosis not present

## 2017-01-07 DIAGNOSIS — I10 Essential (primary) hypertension: Secondary | ICD-10-CM

## 2017-01-07 DIAGNOSIS — R0600 Dyspnea, unspecified: Secondary | ICD-10-CM

## 2017-01-16 ENCOUNTER — Ambulatory Visit (HOSPITAL_BASED_OUTPATIENT_CLINIC_OR_DEPARTMENT_OTHER): Payer: BLUE CROSS/BLUE SHIELD | Attending: Pulmonary Disease | Admitting: Pulmonary Disease

## 2017-01-16 VITALS — Ht 72.0 in | Wt 250.0 lb

## 2017-01-16 DIAGNOSIS — R0683 Snoring: Secondary | ICD-10-CM | POA: Insufficient documentation

## 2017-01-16 DIAGNOSIS — R5383 Other fatigue: Secondary | ICD-10-CM | POA: Insufficient documentation

## 2017-01-16 DIAGNOSIS — G4733 Obstructive sleep apnea (adult) (pediatric): Secondary | ICD-10-CM | POA: Diagnosis not present

## 2017-01-16 DIAGNOSIS — I10 Essential (primary) hypertension: Secondary | ICD-10-CM | POA: Insufficient documentation

## 2017-01-17 ENCOUNTER — Ambulatory Visit (INDEPENDENT_AMBULATORY_CARE_PROVIDER_SITE_OTHER): Payer: BLUE CROSS/BLUE SHIELD | Admitting: Sports Medicine

## 2017-01-17 ENCOUNTER — Encounter: Payer: Self-pay | Admitting: Sports Medicine

## 2017-01-17 VITALS — BP 134/86 | HR 73 | Ht 73.0 in | Wt 250.4 lb

## 2017-01-17 DIAGNOSIS — M7501 Adhesive capsulitis of right shoulder: Secondary | ICD-10-CM | POA: Diagnosis not present

## 2017-01-17 DIAGNOSIS — L910 Hypertrophic scar: Secondary | ICD-10-CM

## 2017-01-17 DIAGNOSIS — M25511 Pain in right shoulder: Secondary | ICD-10-CM | POA: Diagnosis not present

## 2017-01-17 MED ORDER — AMITRIPTYLINE HCL 25 MG PO TABS: 25.0000 mg | ORAL_TABLET | Freq: Every day | ORAL | 2 refills | Status: DC

## 2017-01-17 MED ORDER — METHYLPREDNISOLONE 4 MG PO TBPK: ORAL_TABLET | ORAL | 0 refills | Status: DC

## 2017-01-17 NOTE — Patient Instructions (Addendum)
Please perform the exercise program that Jeneen Rinks has prepared for you and gone over in detail on a daily basis.  In addition to the handout you were provided you can access your program through: [email or web address]   Your unique program code is: [email or web address]    Adhesive Capsulitis Adhesive capsulitis is inflammation of the tendons and ligaments that surround the shoulder joint (shoulder capsule). This condition causes the shoulder to become stiff and painful to move. Adhesive capsulitis is also called frozen shoulder. What are the causes? This condition may be caused by:  An injury to the shoulder joint.  Straining the shoulder.  Not moving the shoulder for a period of time. This can happen if your arm was injured or in a sling.  Long-standing health problems, such as: ? Diabetes. ? Thyroid problems. ? Heart disease. ? Stroke. ? Rheumatoid arthritis. ? Lung disease.  In some cases, the cause may not be known. What increases the risk? This condition is more likely to develop in:  Women.  People who are older than 47 years of age.  What are the signs or symptoms? Symptoms of this condition include:  Pain in the shoulder when moving the arm. There may also be pain when parts of the shoulder are touched. The pain is worse at night or when at rest.  Soreness or aching in the shoulder.  Inability to move the shoulder normally.  Muscle spasms.  How is this diagnosed? This condition is diagnosed with a physical exam and imaging tests, such as an X-ray or MRI. How is this treated? This condition may be treated with:  Treatment of the underlying cause or condition.  Physical therapy. This involves performing exercises to get the shoulder moving again.  Medicine. Medicine may be given to relieve pain, inflammation, or muscle spasms.  Steroid injections into the shoulder joint.  Shoulder manipulation. This is a procedure to move the shoulder into another position. It is  done after you are given a medicine to make you fall asleep (general anesthetic). The joint may also be injected with salt water at high pressure to break down scarring.  Surgery. This may be done in severe cases when other treatments have failed.  Although most people recover completely from adhesive capsulitis, some may not regain the full movement of the shoulder. Follow these instructions at home:  Take over-the-counter and prescription medicines only as told by your health care provider.  If you are being treated with physical therapy, follow instructions from your physical therapist.  Avoid exercises that put a lot of demand on your shoulder, such as throwing. These exercises can make pain worse.  If directed, apply ice to the injured area: ? Put ice in a plastic bag. ? Place a towel between your skin and the bag. ? Leave the ice on for 20 minutes, 2-3 times per day. Contact a health care provider if:  You develop new symptoms.  Your symptoms get worse. This information is not intended to replace advice given to you by your health care provider. Make sure you discuss any questions you have with your health care provider. Document Released: 05/30/2009 Document Revised: 01/08/2016 Document Reviewed: 11/25/2014 Elsevier Interactive Patient Education  [employer].

## 2017-01-17 NOTE — Assessment & Plan Note (Signed)
Marked loss of range of motion on exam.  She actually has improved external rotation at 30 of abduction when performed actively.  Passively she only allows me to get her to approximately 20 of abduction and has almost no internal and external rotation.  Passively she is able to get to 85   Discussed multiple options with patient today but mainly due to her underlying keloid issues she is not interested in injection at this time.  Given no significant trauma will defer any type of x-rays.  If any lack of improvement three-view x-ray of the right shoulder at follow-up.    We discussed physical therapy as an option but fortunately she is only available on Saturdays.  Home exercises including range of motion and scapular stabilization and posterior chain emphasized with the athletic training staff today.  Steroid Dosepak and amitriptyline for nighttime pain as well.  +++++++++++++++++++++++++++++++++++++++++++++++++++++++++++++++ PROCEDURE NOTE: THERAPEUTIC EXERCISES (97110) 15 minutes spent for Therapeutic exercises as stated in above notes.  This included exercises focusing on stretching, strengthening, with significant focus on eccentric aspects.   Proper technique shown and discussed handout in great detail with ATC.  All questions were discussed and answered.

## 2017-01-17 NOTE — Progress Notes (Signed)
OFFICE VISIT NOTE [name]. [name], [facility] - 46 y.o. female MRN [id number]  Date of birth: 1971/02/16  Visit Date: 01/17/2017  PCP: Burnis Medin, MD   Referred by: Burnis Medin, MD  Burlene Arnt, CMA acting as scribe for Dr. Paulla Fore.  SUBJECTIVE:   Chief Complaint  Patient presents with  . pain in shoulder   HPI: As below and per problem based documentation when appropriate.  Pt. Presents today with complaint of right shoulder pain. Pain is mostly on the posterior aspect of the shoulder. She does have mild pain on the anterior apspect of the shoulder. Her sister noticed some swelling in the shoulder.  Pt reports that pain has been going on for several months but has gotten worse over the past month.  No known injury or trauma.   The pain is described as aching, stabbing pain and is rated as severe.  Worsened with ROM, driving, opening doors. Improves with rest, pain does not completely resolve with rest but it does improve.  Therapies tried include : Diclofenac gel with no relief.   Other associated symptoms include: pain radiates into the right side of the neck.  No recent xray of shoulder.   Pt denies fever, chills, unintentional weight loss or weight gain. Pt does c/o night sweats.     Review of Systems  Constitutional: Negative for chills and fever.  Respiratory: Positive for shortness of breath and wheezing.   Cardiovascular: Positive for palpitations. Negative for chest pain.  Musculoskeletal: Positive for joint pain and neck pain. Negative for falls.  Neurological: Negative for dizziness, tingling and headaches.  Endo/Heme/Allergies: Does not bruise/bleed easily.    Otherwise per HPI.  HISTORY & PERTINENT PRIOR DATA:  No specialty comments available. She reports that she has never smoked. She has never used smokeless tobacco. No results for input(s): HGBA1C, LABURIC  in the last 8760 hours. Medications & Allergies reviewed per EMR Patient Active Problem List   Diagnosis Date Noted  . Snoring 10/22/2016  . Nocturnal dyspnea/choking   10/22/2016  . Keloid of skin 09/25/2015  . Essential hypertension, benign 09/27/2014  . Teratoma 08/07/2014  . Severe hypertension 06/08/2013  . HYPERLIPIDEMIA, MILD, WITH LOW HDL 07/23/2009  . SHORTNESS OF BREATH 02/11/2009  . HYPERTENSION 05/07/2008  . KELOID SCAR 05/07/2008  . HEADACHE 05/22/2007   Past Medical History:  Diagnosis Date  . Hypertension   . Hypoglycemia   . Radiation 10/21/2015-10/23/2015   Anterior neck area 12 gray in 3 fractions  . Teratoma    Family History  Problem Relation Age of Onset  . Hypertension Other    Past Surgical History:  Procedure Laterality Date  . ABDOMINAL HERNIA REPAIR    . ABDOMINAL HYSTERECTOMY    . CESAREAN SECTION    . CRYOTHERAPY  2013   to neck  . DERMOID CYST  EXCISION    . ECTOPIC PREGNANCY SURGERY    . EXCISION MASS NECK Bilateral 10/20/2015   Procedure: EXCISION OF LARGE KELOIDS SEVERE RIGHT AND LEFT NECK WITH PLASTIC RECONSTRUCTION;  Surgeon: Cristine Polio, MD;  Location: [facility];  Service: Plastics;  Laterality: Bilateral;  . HAND SURGERY     amputation of right index finger   Social History   Occupational History  . Therapist, music    Social History Main Topics  . Smoking status: Never Smoker  . Smokeless tobacco: Never Used  .  Alcohol use Yes     Comment: socially  . Drug use: No  . Sexual activity: Yes    Birth control/ protection: Surgical    OBJECTIVE:  VS:  HT:    WT:   BMI:     BP:   HR: bpm  TEMP: ( )  RESP:  EXAM: Findings:  WDWN, NAD, Non-toxic appearing Alert & appropriately interactive Not depressed or anxious appearing No increased work of breathing. Pupils are equal. EOM intact without nystagmus No clubbing or cyanosis of the extremities appreciated No significant rashes/lesions/ulcerations  overlying the examined area. Radial pulses 2+/4.  No significant generalized UE edema. Sensation intact to light touch in upper extremities.  Right Shoulder Exam: Normal alignment, Normal Contours No overlying erythema/ecchymosis.  She has a large keloid along the posterior aspect of the right shoulder. Patient has marked guarding and limitations with passive range of motion of the shoulder including with compression and circumduction. No TTP over: Bony prominences Internal Rotation: Abnormal-limited range of motion, effectively 15 at 30 and 90 of abduction.  Strength is intact. External Rotation: 90 at 30 of abduction.  Strength is intact at 4 out of 5 Empty can: Unable to test Hawkins: Abnormal-unable to positioned appropriately test but positive at 45 of flexion Neers: Unable to test Speeds: Unable to test O'Brien's: Unable to test      No results found. ASSESSMENT & PLAN:   Problem List Items Addressed This Visit    Keloid of skin   Adhesive capsulitis of right shoulder   Relevant Medications   methylPREDNISolone (MEDROL DOSEPAK) 4 MG TBPK tablet    Other Visit Diagnoses    Right shoulder pain, unspecified chronicity    -  Primary      Follow-up: Return in about 6 weeks (around 02/28/2017).   CMA/ATC served as Education administrator during this visit. History, Physical, and Plan performed by medical provider. Documentation and orders reviewed and attested to.      [name], [facility] Physician

## 2017-01-24 ENCOUNTER — Telehealth: Payer: Self-pay | Admitting: Pulmonary Disease

## 2017-01-24 DIAGNOSIS — R0683 Snoring: Secondary | ICD-10-CM

## 2017-01-24 DIAGNOSIS — G4733 Obstructive sleep apnea (adult) (pediatric): Secondary | ICD-10-CM | POA: Insufficient documentation

## 2017-01-24 NOTE — Telephone Encounter (Signed)
PSG 01/16/17 >> 57.5, SaO2 low 80%.  CPAP 9 cm H2O >> AHI 1.2.   Will have my nurse inform pt that sleep study shows severe sleep apnea.  Options are 1) CPAP now, 2) ROV first.  If She is agreeable to CPAP, then please send order for CPAP 9 cm H2O with heated humidity and mask of choice.  Have download sent 1 month after starting CPAP and set up ROV 2 months after starting CPAP.  ROV can be with me or NP.

## 2017-01-24 NOTE — Procedures (Signed)
   Patient Name: [name], [name] Date: 01/16/2017 Gender: Female D.O.B: [birth year] Age (years): 57 Referring Provider: Chesley Mires MD, ABSM Height (inches): 70 Interpreting Physician: Chesley Mires MD, ABSM Weight (lbs): 250 RPSGT: Baxter Flattery BMI: 36 MRN: [id number] Neck Size: 17.00  CLINICAL INFORMATION Sleep Study Type: Split Night CPAP  Indication for sleep study: Fatigue, Hypertension, OSA, Snoring, Witnessed Apneas  Epworth Sleepiness Score: 13  SLEEP STUDY TECHNIQUE As per the AASM Manual for the Scoring of Sleep and Associated Events v2.3 (April 2016) with a hypopnea requiring 4% desaturations.  The channels recorded and monitored were frontal, central and occipital EEG, electrooculogram (EOG), submentalis EMG (chin), nasal and oral airflow, thoracic and abdominal wall motion, anterior tibialis EMG, snore microphone, electrocardiogram, and pulse oximetry. Continuous positive airway pressure (CPAP) was initiated when the patient met split night criteria and was titrated according to treat sleep-disordered breathing.  MEDICATIONS Medications self-administered by patient taken the night of the study : GOODYS  RESPIRATORY PARAMETERS Diagnostic  Total AHI (/hr): 57.5 RDI (/hr): 58.5 OA Index (/hr): 42.6 CA Index (/hr): 1.1 REM AHI (/hr): 95.4 NREM AHI (/hr): 49.6 Supine AHI (/hr): 57.5 Non-supine AHI (/hr): N/A Min O2 Sat (%): 80.00 Mean O2 (%): 93.55 Time below 88% (min): 8.1   Titration  Optimal Pressure (cm): 9 AHI at Optimal Pressure (/hr): 1.4 Min O2 at Optimal Pressure (%): 92.0 Supine % at Optimal (%): 100 Sleep % at Optimal (%): 98    SLEEP ARCHITECTURE The recording time for the entire night was 381.4 minutes.  During a baseline period of 191.5 minutes, the patient slept for 112.7 minutes in REM and nonREM, yielding a sleep efficiency of 58.9%. Sleep onset after lights out was 1.8 minutes with a REM latency of 75.0 minutes. The patient spent 19.07% of  the night in stage N1 sleep, 63.63% in stage N2 sleep, 0.00% in stage N3 and 17.30% in REM.  During the titration period of 184.5 minutes, the patient slept for 177.6 minutes in REM and nonREM, yielding a sleep efficiency of 96.3%. Sleep onset after CPAP initiation was 0.8 minutes with a REM latency of 27.5 minutes. The patient spent 3.17% of the night in stage N1 sleep, 53.48% in stage N2 sleep, 0.00% in stage N3 and 43.35% in REM.  CARDIAC DATA The 2 lead EKG demonstrated sinus rhythm. The mean heart rate was 66.14 beats per minute. Other EKG findings include: None.  LEG MOVEMENT DATA The total Periodic Limb Movements of Sleep (PLMS) were 0. The PLMS index was 0.00 .  IMPRESSIONS - Severe obstructive sleep apnea occurred during the diagnostic portion of the study (AHI = 57.5/hour). An optimal PAP pressure was selected for this patient ( 9 cm of water) - Moderate oxygen desaturation was noted during the diagnostic portion of the study (Min O2 =80.00%).  DIAGNOSIS - Obstructive Sleep Apnea (327.23 [G47.33 ICD-10])  RECOMMENDATIONS - Trial of CPAP therapy on 9 cm H2O. - She was fitted with a Medium size Resmed Full Face Mask AirFit F20 mask and heated humidification.  [Electronically signed] 01/24/2017 09:52 AM  Chesley Mires MD, ABSM Diplomate, American Board of Sleep Medicine   NPI: [id number]

## 2017-01-24 NOTE — Telephone Encounter (Signed)
Left message for patient to contact office for medical results.

## 2017-01-25 NOTE — Telephone Encounter (Signed)
Spoke with pt and she preferred to wait until her follow up appt with TP on 7/6 to discuss further. Pt at this time has no further questions at this time. Nothing further is needed

## 2017-01-25 NOTE — Telephone Encounter (Signed)
lmtcb x2 for pt. 

## 2017-01-25 NOTE — Telephone Encounter (Signed)
Pt returning call for results, please advise she can be reached @ [phone].[name]

## 2017-02-18 ENCOUNTER — Ambulatory Visit (INDEPENDENT_AMBULATORY_CARE_PROVIDER_SITE_OTHER): Payer: BLUE CROSS/BLUE SHIELD | Admitting: Adult Health

## 2017-02-18 ENCOUNTER — Encounter: Payer: Self-pay | Admitting: Adult Health

## 2017-02-18 ENCOUNTER — Ambulatory Visit (INDEPENDENT_AMBULATORY_CARE_PROVIDER_SITE_OTHER): Payer: BLUE CROSS/BLUE SHIELD | Admitting: Pulmonary Disease

## 2017-02-18 VITALS — BP 126/80 | HR 80 | Ht 72.0 in | Wt 248.0 lb

## 2017-02-18 DIAGNOSIS — R0602 Shortness of breath: Secondary | ICD-10-CM

## 2017-02-18 DIAGNOSIS — R0609 Other forms of dyspnea: Secondary | ICD-10-CM | POA: Diagnosis not present

## 2017-02-18 DIAGNOSIS — G4733 Obstructive sleep apnea (adult) (pediatric): Secondary | ICD-10-CM

## 2017-02-18 LAB — PULMONARY FUNCTION TEST
DL/VA % pred: 83 %
DL/VA: 4.68 ml/min/mmHg/L
DLCO UNC % PRED: 64 %
DLCO UNC: 22.64 ml/min/mmHg
DLCO cor % pred: 63 %
DLCO cor: 22.37 ml/min/mmHg
FEF 25-75 POST: 2.4 L/s
FEF 25-75 Pre: 3.09 L/sec
FEF2575-%Change-Post: -22 %
FEF2575-%PRED-POST: 75 %
FEF2575-%Pred-Pre: 97 %
FEV1-%Change-Post: -5 %
FEV1-%PRED-POST: 81 %
FEV1-%Pred-Pre: 86 %
FEV1-Post: 2.58 L
FEV1-Pre: 2.74 L
FEV1FVC-%Change-Post: 0 %
FEV1FVC-%PRED-PRE: 101 %
FEV6-%Change-Post: -5 %
FEV6-%Pred-Post: 80 %
FEV6-%Pred-Pre: 85 %
FEV6-POST: 3.11 L
FEV6-Pre: 3.28 L
FEV6FVC-%PRED-POST: 102 %
FEV6FVC-%Pred-Pre: 102 %
FVC-%CHANGE-POST: -5 %
FVC-%PRED-PRE: 83 %
FVC-%Pred-Post: 79 %
FVC-POST: 3.11 L
FVC-PRE: 3.29 L
PRE FEV1/FVC RATIO: 83 %
Post FEV1/FVC ratio: 83 %
Post FEV6/FVC ratio: 100 %
Pre FEV6/FVC Ratio: 100 %
RV % pred: 73 %
RV: 1.55 L
TLC % pred: 78 %
TLC: 4.9 L

## 2017-02-18 NOTE — Progress Notes (Signed)
@Patient  ID: [name], female    DOB: 12-Jan-1971, 46 y.o.   MRN: [id number]  Chief Complaint  Patient presents with  . Follow-up    OSA     Referring provider: Burnis Medin, MD  HPI: 46 year old never smoker seen for pulmonary consult 12/25/2006 for snoring and dyspnea.  TEST  NPSG 01/16/2017>AHI 57.5, SaO2 low 80%, C Pap 9 cm, AHI 1.2.  PFT 02/18/2017, FEV1 86%, ratio 83, FVC 83%, DLCO 64%, no significant bronchodilator response  02/18/2017 Follow up: OSA [name]  Patient returns for a two-month follow-up. Patient was seen May 11 for a pulmonary consult for snoring and dyspnea  He was set up for a sleep study that was done on June 3 that showed severe sleep apnea with AHI of 57/hr. optimal control on C Pap 9 cm H2O with AHI 1.2. He discussed her sleep study results. Went over diagnosis of sleep apnea and potential complications from untreated sleep apnea. We went over treatment options including weight loss C Pap machine. Patient would like to proceed with C Pap at bedtime.  Patient was having some episodes of intermittent dyspnea for last year. Patient was set up for a pulmonary function test that showed no significant airflow obstruction or restriction. Was noted bronchodilator response. She did have a moderate diffusing defect. With a DLCO at 64%. Recent chest x-ray February 2018 showed clear lungs. Patient's weight is 248 pounds. There was no anemia. No cough or wheezing .  Recent echo 01/07/2017 showed mild LVH, grade 1 diastolic dysfunction, EF 46-27%, pulmonary artery pressure 26 mmHg.   Allergies  Allergen Reactions  . Tylox [Oxycodone-Acetaminophen] Swelling    Caused face to swell    Immunization History  Administered Date(s) Administered  . Td 08/16/2005    Past Medical History:  Diagnosis Date  . Hypertension   . Hypoglycemia   . Radiation 10/21/2015-10/23/2015   Anterior neck area 12 gray in 3 fractions  . Teratoma     Tobacco History: History    Smoking Status  . Never Smoker  Smokeless Tobacco  . Never Used   Counseling given: Not Answered   Outpatient Encounter Prescriptions as of 02/18/2017  Medication Sig  . amitriptyline (ELAVIL) 25 MG tablet Take 1 tablet (25 mg total) by mouth at bedtime.  . Chromium 200 MCG CAPS Take 200 mcg by mouth daily. Reported on 10/23/2015  . co-enzyme Q-10 30 MG capsule Take 30 mg by mouth 3 (three) times daily.  . diclofenac sodium (VOLTAREN) 1 % GEL Apply 4 g topically 4 (four) times daily. To area as directed for joint pain.  . Olmesartan-Amlodipine-HCTZ 40-5-25 MG TABS Take 1 tablet by mouth daily.  . [DISCONTINUED] methylPREDNISolone (MEDROL DOSEPAK) 4 MG TBPK tablet Take by mouth as directed. Take 6 tablets on the first day prescribed then as directed. (Patient not taking: Reported on 02/18/2017)   No facility-administered encounter medications on file as of 02/18/2017.      Review of Systems  Constitutional:   No  weight loss, night sweats,  Fevers, chills, fatigue, or  lassitude.  HEENT:   No headaches,  Difficulty swallowing,  Tooth/dental problems, or  Sore throat,                No sneezing, itching, ear ache, nasal congestion, post nasal drip,   CV:  No chest pain,  Orthopnea, PND, swelling in lower extremities, anasarca, dizziness, palpitations, syncope.   GI  No heartburn, indigestion, abdominal pain, nausea, vomiting, diarrhea, change  in bowel habits, loss of appetite, bloody stools.   Resp: No shortness of breath with exertion or at rest.  No excess mucus, no productive cough,  No non-productive cough,  No coughing up of blood.  No change in color of mucus.  No wheezing.  No chest wall deformity  Skin: no rash or lesions.  GU: no dysuria, change in color of urine, no urgency or frequency.  No flank pain, no hematuria   MS:  No joint pain or swelling.  No decreased range of motion.  No back pain.    Physical Exam  BP 126/80 (BP Location: Left Arm, Cuff Size: Normal)    Pulse 80   Ht 6' (1.829 m)   Wt 248 lb (112.5 kg)   SpO2 95%   BMI 33.63 kg/m   GEN: A/Ox3; pleasant , NAD, obese ,    HEENT:  /AT,  EACs-clear, TMs-wnl, NOSE-clear, THROAT-clear, no lesions, no postnasal drip or exudate noted. Class 3 MP airway   NECK:  Supple w/ fair ROM; no JVD; normal carotid impulses w/o bruits; no thyromegaly or nodules palpated; no lymphadenopathy.  Keloid scars   RESP  Clear  P & A; w/o, wheezes/ rales/ or rhonchi. no accessory muscle use, no dullness to percussion  CARD:  RRR, no m/r/g, tr  peripheral edema, pulses intact, no cyanosis or clubbing.  GI:   Soft & nt; nml bowel sounds; no organomegaly or masses detected.   Musco: Warm bil, no deformities or joint swelling noted.   Neuro: alert, no focal deficits noted.    Skin: Warm, no lesions or rashes    Lab Results:  CBC    Component Value Date/Time   WBC 5.9 10/07/2016 1048   RBC 4.72 10/07/2016 1048   HGB 14.3 10/07/2016 1048   HCT 42.1 10/07/2016 1048   PLT 244.0 10/07/2016 1048   MCV 89.2 10/07/2016 1048   MCH 30.2 01/05/2015 1546   MCHC 33.9 10/07/2016 1048   RDW 13.7 10/07/2016 1048   LYMPHSABS 2.5 10/07/2016 1048   MONOABS 0.4 10/07/2016 1048   EOSABS 0.1 10/07/2016 1048   BASOSABS 0.0 10/07/2016 1048    BMET    Component Value Date/Time   NA 140 10/07/2016 1048   K 3.8 10/07/2016 1048   CL 103 10/07/2016 1048   CO2 30 10/07/2016 1048   GLUCOSE 102 (H) 10/07/2016 1048   BUN 16 10/07/2016 1048   CREATININE 0.72 10/07/2016 1048   CALCIUM 9.7 10/07/2016 1048   GFRNONAA >60 10/17/2015 1600   GFRAA >60 10/17/2015 1600    BNP    Component Value Date/Time   BNP 5.6 01/05/2015 1546    ProBNP No results found for: PROBNP  Imaging: No results found.   Assessment & Plan:   OSA (obstructive sleep apnea) Severe OSA  Pt education on OSA and CPAP treatment   Plan  Patient Instructions  Begin C Pap at bedtime Try to wear at least 4-6 hours. Work on weight  loss Do not drive a sleepy Follow up with Dr. Halford Chessman  Or Tanique Matney NP in 2 months and As needed       Morbid obesity ([place]) Wt loss   SHORTNESS OF BREATH No airflow obstruction or restriction on PFT  Isolated moderate diffucing defect ? Etiology  CXR is clear . No ambulatory desats on room air .  Echo ok .  Continue to follow . If persists may need CPST.  +/- HRCT chest .      Yenni Carra  Ugochukwu Chichester, NP 02/18/2017

## 2017-02-18 NOTE — Progress Notes (Signed)
PFT done today. 

## 2017-02-18 NOTE — Assessment & Plan Note (Signed)
No airflow obstruction or restriction on PFT  Isolated moderate diffucing defect ? Etiology  CXR is clear . No ambulatory desats on room air .  Echo ok .  Continue to follow . If persists may need CPST.  +/- HRCT chest .

## 2017-02-18 NOTE — Assessment & Plan Note (Signed)
Wt loss  

## 2017-02-18 NOTE — Patient Instructions (Addendum)
Begin C Pap at bedtime Try to wear at least 4-6 hours. Work on weight loss Do not drive a sleepy Follow up with Dr. Halford Chessman  Or Rhonda Linan NP in 2 months and As needed

## 2017-02-18 NOTE — Assessment & Plan Note (Addendum)
Severe OSA  Pt education on OSA and CPAP treatment   Plan  Patient Instructions  Begin C Pap at bedtime Try to wear at least 4-6 hours. Work on weight loss Do not drive a sleepy Follow up with Dr. Halford Chessman  Or Parrett NP in 2 months and As needed

## 2017-02-18 NOTE — Progress Notes (Signed)
I have reviewed and agree with assessment/plan.  Chesley Mires, MD [facility] Pulmonary/Critical Care 02/18/2017, 11:20 AM Pager:  [phone]

## 2017-02-28 ENCOUNTER — Ambulatory Visit: Payer: BLUE CROSS/BLUE SHIELD | Admitting: Sports Medicine

## 2017-03-02 ENCOUNTER — Telehealth: Payer: Self-pay | Admitting: Pulmonary Disease

## 2017-03-02 NOTE — Telephone Encounter (Signed)
-----   Message from [place] sent at 03/02/2017 11:30 AM EDT ----- Regarding: RE: Cpap We have been unable to get in contact with the pt and her home number will not take VM.  I have reached out to our RT Dpt to see if they can try the Mobile number for contact.  [phone].  She can always call in for [place] to get her scheduled.  Thank you! ----- Message ----- From: Harland German Sent: 03/02/2017  11:18 AM To: Melissa Stenson Subject: Cpap                                           We just received a phone message from this patient saying she still hasn't heard from you guys about her cpap. Do you know the status? Thanks Chantel

## 2017-03-02 NOTE — Telephone Encounter (Signed)
Staff message sent to [facility] @ [facility]

## 2017-03-02 NOTE — Telephone Encounter (Signed)
lmom tcb x1 to pt, please provide patient with [facility] number to schedule her appt to obtain her cpap, they have tried calling her but have not received any call backs

## 2017-03-03 NOTE — Telephone Encounter (Signed)
lmomtcb x 2 for the pt to make her aware to call [facility]

## 2017-03-04 NOTE — Telephone Encounter (Signed)
Called and spoke with pt and she stated that she is set up for next Friday at 1 to get set up with her cpap.

## 2017-04-01 ENCOUNTER — Ambulatory Visit: Payer: BLUE CROSS/BLUE SHIELD | Admitting: Sports Medicine

## 2017-04-01 DIAGNOSIS — Z0289 Encounter for other administrative examinations: Secondary | ICD-10-CM

## 2017-04-01 NOTE — Progress Notes (Deleted)
  OFFICE VISIT NOTE [name], [place] at [facility] - 46 y.o. female MRN [id number]  Date of birth: 1970/12/26  Visit Date: 04/01/2017  PCP: Burnis Medin, MD   Referred by: Burnis Medin, MD  Burlene Arnt, CMA acting as scribe for Dr. Paulla Fore.  SUBJECTIVE:  No chief complaint on file.  HPI: As below and per problem based documentation when appropriate.  HPI  ROS  Otherwise per HPI.  HISTORY & PERTINENT PRIOR DATA:  No specialty comments available. She reports that she has never smoked. She has never used smokeless tobacco. No results for input(s): HGBA1C, LABURIC in the last 8760 hours. Medications & Allergies reviewed per EMR Patient Active Problem List   Diagnosis Date Noted  . Morbid obesity ([place]) 02/18/2017  . OSA (obstructive sleep apnea) 01/24/2017  . 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 Snoring 10/22/2016  . Nocturnal dyspnea/choking   10/22/2016  . Keloid of skin 09/25/2015  . Essential hypertension, benign 09/27/2014  . Teratoma 08/07/2014  . Severe hypertension 06/08/2013  . HYPERLIPIDEMIA, MILD, WITH LOW HDL 07/23/2009  . SHORTNESS OF BREATH 02/11/2009  . HYPERTENSION 05/07/2008  . KELOID SCAR 05/07/2008  . HEADACHE 05/22/2007   Past Medical History:  Diagnosis Date  . Hypertension   . Hypoglycemia   . Radiation 10/21/2015-10/23/2015   Anterior neck area 12 gray in 3 fractions  . Teratoma    Family History  Problem Relation Age of Onset  . Hypertension Other    Past Surgical History:  Procedure Laterality Date  . ABDOMINAL HERNIA REPAIR    . ABDOMINAL HYSTERECTOMY    . CESAREAN SECTION    . CRYOTHERAPY  2013   to neck  . DERMOID CYST  EXCISION    . ECTOPIC PREGNANCY SURGERY    . EXCISION MASS NECK Bilateral 10/20/2015   Procedure: EXCISION OF LARGE KELOIDS SEVERE RIGHT AND LEFT NECK WITH PLASTIC RECONSTRUCTION;  Surgeon: Cristine Polio, MD;   Location: [place];  Service: Plastics;  Laterality: Bilateral;  . HAND SURGERY     amputation of right index finger   Social History   Occupational History  . Therapist, music    Social History Main Topics  . Smoking status: Never Smoker  . Smokeless tobacco: Never Used  . Alcohol use Yes     Comment: socially  . Drug use: No  . Sexual activity: Yes    Birth control/ protection: Surgical    OBJECTIVE:  VS:  HT:    WT:   BMI:     BP:   HR: bpm  TEMP: ( )  RESP:  EXAM: No additional findings.    No results found. ASSESSMENT & PLAN:  { }

## 2017-04-22 ENCOUNTER — Encounter: Payer: Self-pay | Admitting: Sports Medicine

## 2017-04-22 ENCOUNTER — Ambulatory Visit (INDEPENDENT_AMBULATORY_CARE_PROVIDER_SITE_OTHER): Payer: BLUE CROSS/BLUE SHIELD | Admitting: Sports Medicine

## 2017-04-22 ENCOUNTER — Ambulatory Visit: Payer: BLUE CROSS/BLUE SHIELD | Admitting: Adult Health

## 2017-04-22 VITALS — BP 110/80 | HR 95 | Ht 72.0 in | Wt 249.4 lb

## 2017-04-22 DIAGNOSIS — I10 Essential (primary) hypertension: Secondary | ICD-10-CM

## 2017-04-22 DIAGNOSIS — M25511 Pain in right shoulder: Secondary | ICD-10-CM | POA: Diagnosis not present

## 2017-04-22 DIAGNOSIS — M7501 Adhesive capsulitis of right shoulder: Secondary | ICD-10-CM | POA: Diagnosis not present

## 2017-04-22 DIAGNOSIS — L910 Hypertrophic scar: Secondary | ICD-10-CM | POA: Diagnosis not present

## 2017-04-22 NOTE — Progress Notes (Signed)
OFFICE VISIT NOTE Juanda Bond. Zikeria Keough, [place] at [place] - 46 y.o. female MRN [id number]  Date of birth: May 23, 1971  Visit Date: 04/22/2017  PCP: Burnis Medin, MD   Referred by: Burnis Medin, MD  Burlene Arnt, CMA acting as scribe for Dr. Paulla Fore.  SUBJECTIVE:   Chief Complaint  Patient presents with  . Follow-up    RT shoulder pain   HPI: As below and per problem based documentation when appropriate.  Lili is an established patient presenting today in follow-up of RT shoulder pain. She was last seen 01/17/2017 and prescribed Medrol Dosepak and given home exercises.  She reports improvement in RT shoulder pain and stiffness after taking steroid dosepak. She was prescribed Amitriptyline but only took a few of those because she was noticing improvement with the steroid. She is doing home exercises with no trouble. She does still have limited ROM but this has improved. She has no pain when the arm is parallel to the floor but pain begins if she raises her arm much higher. She has been doing water aerobics on Saturday and feels like this has been helping. She has radiation of pain into the upper back and neck. No radiation of pain into the arm.     Review of Systems  Constitutional: Negative for chills and fever.  Respiratory: Positive for shortness of breath. Negative for wheezing.   Cardiovascular: Negative for chest pain and palpitations.  Gastrointestinal: Negative.   Musculoskeletal: Positive for joint pain. Negative for falls.  Neurological: Negative for dizziness, tingling and headaches.  Endo/Heme/Allergies: Does not bruise/bleed easily.    Otherwise per HPI.  HISTORY & PERTINENT PRIOR DATA:  No specialty comments available. She reports that she has never smoked. She has never used smokeless tobacco. No results for input(s): HGBA1C, LABURIC in the last 8760 hours. Medications & Allergies  reviewed per EMR Patient Active Problem List   Diagnosis Date Noted  . Morbid obesity ([place]) 02/18/2017  . OSA (obstructive sleep apnea) 01/24/2017  . 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 Snoring 10/22/2016  . Nocturnal dyspnea/choking   10/22/2016  . Keloid of skin 09/25/2015  . Essential hypertension, benign 09/27/2014  . Teratoma 08/07/2014  . Severe hypertension 06/08/2013  . HYPERLIPIDEMIA, MILD, WITH LOW HDL 07/23/2009  . SHORTNESS OF BREATH 02/11/2009  . HYPERTENSION 05/07/2008  . KELOID SCAR 05/07/2008  . HEADACHE 05/22/2007   Past Medical History:  Diagnosis Date  . Hypertension   . Hypoglycemia   . Radiation 10/21/2015-10/23/2015   Anterior neck area 12 gray in 3 fractions  . Teratoma    Family History  Problem Relation Age of Onset  . Hypertension Other    Past Surgical History:  Procedure Laterality Date  . ABDOMINAL HERNIA REPAIR    . ABDOMINAL HYSTERECTOMY    . CESAREAN SECTION    . CRYOTHERAPY  2013   to neck  . DERMOID CYST  EXCISION    . ECTOPIC PREGNANCY SURGERY    . EXCISION MASS NECK Bilateral 10/20/2015   Procedure: EXCISION OF LARGE KELOIDS SEVERE RIGHT AND LEFT NECK WITH PLASTIC RECONSTRUCTION;  Surgeon: Cristine Polio, MD;  Location: [place];  Service: Plastics;  Laterality: Bilateral;  . HAND SURGERY     amputation of right index finger   Social History   Occupational History  . Therapist, music    Social History Main Topics  . Smoking status: Never  Smoker  . Smokeless tobacco: Never Used  . Alcohol use Yes     Comment: socially  . Drug use: No  . Sexual activity: Yes    Birth control/ protection: Surgical    OBJECTIVE:  VS:  HT:6' (182.9 cm)   WT:249 lb 6.4 oz (113.1 kg)  BMI:33.82    BP:110/80  HR:95bpm  TEMP: ( )  RESP:96 % EXAM: Findings:  Right shoulder range of motion is markedly limited with total shoulder arc 30 degrees of approximately 40 degrees total.  Her internal rotation and  external rotation strength is intact.  Upper extremity sensation is normal.  She has no pain with brachial plexus squeeze or arm squeeze test.  Minimal pain with axial load and circumduction but once again this is mild.  She has a small keloid over the posterior aspect of the shoulder which appears to be in the location of the prior subacromial injection performed years ago I suspect.        ASSESSMENT & PLAN:     ICD-10-CM   1. Keloid of skin L91.0   2. Right shoulder pain, unspecified chronicity M25.511   3. Adhesive capsulitis of right shoulder M75.01   4. Essential hypertension, benign I10   5. Morbid obesity (HCC) E66.01    ================================================================= Adhesive capsulitis of right shoulder Normal range of motion is significant limited which is consistent with adhesive capsulitis.  Her strength is intact and I do not think that she has a significant rotator cuff tear.  We discussed the options for intra-articular injection which she would like to defer especially given the propensity she has were keloid for motion.  I agree at this time that as long she is pain-free and showing no worsening range of motion symptoms that continue conservative care is appropriate.  Physical therapy was offered but she would like to defer.  She will follow-up forconsideration of intra-articular injection for any worsening features otherwise as needed.  I would like to avoid any systemic use of steroids again especially in setting of obesity she would also benefit from avoiding NSAIDs given her hypertension.   ================================================================= Patient Instructions  If you are not improving or if you have any worsening I would like to see you sooner rather than later.  Please call to schedule an appointment for any of the above reasons.  Continue with the therapeutic exercises we provided you on a daily  basis.  ================================================================= Future Appointments Date Time Provider [place]  06/13/2017 9:00 AM Parrett, Fonnie Mu, NP [facility] None    Follow-up: Return if symptoms worsen or fail to improve.   CMA/ATC served as Education administrator during this visit. History, Physical, and Plan performed by medical provider. Documentation and orders reviewed and attested to.      Teresa Coombs,  Sports Medicine Physician

## 2017-04-22 NOTE — Patient Instructions (Signed)
If you are not improving or if you have any worsening I would like to see you sooner rather than later.  Please call to schedule an appointment for any of the above reasons.  Continue with the therapeutic exercises we provided you on a daily basis.

## 2017-05-06 ENCOUNTER — Ambulatory Visit: Payer: BLUE CROSS/BLUE SHIELD | Admitting: Internal Medicine

## 2017-05-06 ENCOUNTER — Encounter: Payer: Self-pay | Admitting: Internal Medicine

## 2017-05-09 ENCOUNTER — Ambulatory Visit: Payer: BLUE CROSS/BLUE SHIELD | Admitting: Adult Health

## 2017-05-15 NOTE — Assessment & Plan Note (Addendum)
Normal range of motion is significant limited which is consistent with adhesive capsulitis.  Her strength is intact and I do not think that she has a significant rotator cuff tear.  We discussed the options for intra-articular injection which she would like to defer especially given the propensity she has were keloid for motion.  I agree at this time that as long she is pain-free and showing no worsening range of motion symptoms that continue conservative care is appropriate.  Physical therapy was offered but she would like to defer.  She will follow-up forconsideration of intra-articular injection for any worsening features otherwise as needed.  I would like to avoid any systemic use of steroids again especially in setting of obesity she would also benefit from avoiding NSAIDs given her hypertension.

## 2017-06-13 ENCOUNTER — Encounter: Payer: Self-pay | Admitting: Adult Health

## 2017-06-13 ENCOUNTER — Ambulatory Visit (INDEPENDENT_AMBULATORY_CARE_PROVIDER_SITE_OTHER): Payer: BLUE CROSS/BLUE SHIELD | Admitting: Adult Health

## 2017-06-13 DIAGNOSIS — G4733 Obstructive sleep apnea (adult) (pediatric): Secondary | ICD-10-CM | POA: Diagnosis not present

## 2017-06-13 NOTE — Progress Notes (Signed)
@Patient  ID: [name], female    DOB: [birth year], 46 y.o.   MRN: [id number]  Chief Complaint  Patient presents with  . Follow-up    OSA     Referring provider: Burnis Medin, MD  HPI: 46 year old never smoker seen for pulmonary consult 12/25/2006 for snoring and dyspnea found to have severe OSA .   TEST  NPSG 01/16/2017>AHI 57.5, SaO2 low 80%, C Pap 9 cm, AHI 1.2.  PFT 02/18/2017, FEV1 86%, ratio 83, FVC 83%, DLCO 64%, no significant bronchodilator response  06/13/2017 Follow up : OSA  Pt returns for 3 month follow up for severe OSA . She was started on CPAP At bedtime  Last ov.  She feels the CPAP is working . She is sleeping better. She has less daytime sleepiness.  Wears the CPAP most nights. Gets in [place] each night .  She feels more rested. Not as sleepy during daytime.   Changed from full face mask to nasal mask due to face irritation.  Download shows good compliance with avg usage at 5hr . AHI 4.4. On CPAP @ 9cmH2O.   She was having some intermittent dyspnea for ~1 yr. . Chest xray was clear. PFT showed no airflow obstruction or restriction . DLCO was decreased. Echo showed Gr 1 DD.  This has been improving since starting CPAP . Has not had any events since last visit.     Allergies  Allergen Reactions  . Tylox [Oxycodone-Acetaminophen] Swelling    Caused face to swell    Immunization History  Administered Date(s) Administered  . Td 08/16/2005    Past Medical History:  Diagnosis Date  . Hypertension   . Hypoglycemia   . Radiation 10/21/2015-10/23/2015   Anterior neck area 12 gray in 3 fractions  . Teratoma     Tobacco History: History  Smoking Status  . Never Smoker  Smokeless Tobacco  . Never Used   Counseling given: Not Answered   Outpatient Encounter Prescriptions as of 06/13/2017  Medication Sig  . Chromium 200 MCG CAPS Take 200 mcg by mouth daily. Reported on 10/23/2015  . co-enzyme Q-10 30 MG capsule Take 30 mg by mouth 3 (three)  times daily.  . diclofenac sodium (VOLTAREN) 1 % GEL Apply 4 g topically 4 (four) times daily. To area as directed for joint pain.  . Olmesartan-Amlodipine-HCTZ 40-5-25 MG TABS Take 1 tablet by mouth daily.  . [DISCONTINUED] amitriptyline (ELAVIL) 25 MG tablet Take 1 tablet (25 mg total) by mouth at bedtime. (Patient not taking: Reported on 04/22/2017)   No facility-administered encounter medications on file as of 06/13/2017.      Review of Systems  Constitutional:   No  weight loss, night sweats,  Fevers, chills, fatigue, or  lassitude.  HEENT:   No headaches,  Difficulty swallowing,  Tooth/dental problems, or  Sore throat,                No sneezing, itching, ear ache, nasal congestion, post nasal drip,   CV:  No chest pain,  Orthopnea, PND, swelling in lower extremities, anasarca, dizziness, palpitations, syncope.   GI  No heartburn, indigestion, abdominal pain, nausea, vomiting, diarrhea, change in bowel habits, loss of appetite, bloody stools.   Resp: No shortness of breath with exertion or at rest.  No excess mucus, no productive cough,  No non-productive cough,  No coughing up of blood.  No change in color of mucus.  No wheezing.  No chest wall deformity  Skin: no rash  or lesions.  GU: no dysuria, change in color of urine, no urgency or frequency.  No flank pain, no hematuria   MS:  No joint pain or swelling.  No decreased range of motion.  No back pain.    Physical Exam  BP 138/72 (BP Location: Left Arm, Cuff Size: Large)   Pulse 79   Ht 6\' 1"  (1.854 m)   Wt 249 lb (112.9 kg)   SpO2 98%   BMI 32.85 kg/m   GEN: A/Ox3; pleasant , NAD, obese    HEENT:  [place]/AT,  EACs-clear, TMs-wnl, NOSE-clear, THROAT-clear, no lesions, no postnasal drip or exudate noted. Class 3 MP airway   NECK:  Supple w/ fair ROM; no JVD; normal carotid impulses w/o bruits; no thyromegaly or nodules palpated; no lymphadenopathy.    RESP  Clear  P & A; w/o, wheezes/ rales/ or rhonchi. no accessory  muscle use, no dullness to percussion  CARD:  RRR, no m/r/g, no peripheral edema, pulses intact, no cyanosis or clubbing.  GI:   Soft & nt; nml bowel sounds; no organomegaly or masses detected.   Musco: Warm bil, no deformities or joint swelling noted.   Neuro: alert, no focal deficits noted.    Skin: Warm, +keloids     Lab Results:    ProBNP No results found for: PROBNP  Imaging: No results found.   Assessment & Plan:   OSA (obstructive sleep apnea) Improved control on CPAP   Plan  Patient Instructions  Continue on C Pap at bedtime Keep up good work .  Try to wear at least 4-6 hours. Work on healthy weight .  Do not drive a sleepy Follow up with Dr. Halford Chessman  Or Parrett NP in 6 months and As needed         Morbid obesity ([place]) Wt loss.      Rexene Edison, NP 06/13/2017

## 2017-06-13 NOTE — Progress Notes (Signed)
I have reviewed and agree with assessment/plan.  Chesley Mires, MD [facility] Pulmonary/Critical Care 06/13/2017, 11:05 AM Pager:  [phone]

## 2017-06-13 NOTE — Patient Instructions (Signed)
Continue on C Pap at bedtime Keep up good work .  Try to wear at least 4-6 hours. Work on healthy weight .  Do not drive a sleepy Follow up with Dr. Halford Chessman  Or Parrett NP in 6 months and As needed

## 2017-06-13 NOTE — Assessment & Plan Note (Signed)
Wt loss  

## 2017-06-13 NOTE — Assessment & Plan Note (Signed)
Improved control on CPAP   Plan  Patient Instructions  Continue on C Pap at bedtime Keep up good work .  Try to wear at least 4-6 hours. Work on healthy weight .  Do not drive a sleepy Follow up with Dr. Halford Chessman  Or Doylene Splinter NP in 6 months and As needed

## 2017-07-03 ENCOUNTER — Emergency Department (HOSPITAL_COMMUNITY)
Admission: EM | Admit: 2017-07-03 | Discharge: 2017-07-03 | Disposition: A | Discharge status: Home / Self Care | Payer: BLUE CROSS/BLUE SHIELD | Attending: Emergency Medicine | Admitting: Emergency Medicine

## 2017-07-03 ENCOUNTER — Encounter (HOSPITAL_COMMUNITY): Payer: Self-pay | Admitting: Emergency Medicine

## 2017-07-03 ENCOUNTER — Other Ambulatory Visit: Payer: Self-pay

## 2017-07-03 DIAGNOSIS — M62838 Other muscle spasm: Secondary | ICD-10-CM | POA: Diagnosis not present

## 2017-07-03 DIAGNOSIS — Z79899 Other long term (current) drug therapy: Secondary | ICD-10-CM | POA: Diagnosis not present

## 2017-07-03 DIAGNOSIS — I10 Essential (primary) hypertension: Secondary | ICD-10-CM | POA: Insufficient documentation

## 2017-07-03 DIAGNOSIS — M542 Cervicalgia: Secondary | ICD-10-CM | POA: Diagnosis present

## 2017-07-03 MED ORDER — CYCLOBENZAPRINE HCL 5 MG PO TABS: 5.0000 mg | ORAL_TABLET | Freq: Three times a day (TID) | ORAL | 0 refills | Status: DC | PRN

## 2017-07-03 MED ORDER — IBUPROFEN 600 MG PO TABS: 600.0000 mg | ORAL_TABLET | Freq: Three times a day (TID) | ORAL | 0 refills | Status: DC | PRN

## 2017-07-03 MED ORDER — IBUPROFEN 800 MG PO TABS
800.0000 mg | ORAL_TABLET | Freq: Once | ORAL | Status: AC
  Administered 2017-07-03: 800 mg via ORAL
  Filled 2017-07-03: qty 1

## 2017-07-03 NOTE — Discharge Instructions (Signed)
You have neck pain, possibly from a cervical strain and/or pinched nerve.  ° °SEEK IMMEDIATE MEDICAL ATTENTION IF: °You develop difficulties swallowing or breathing.  °You have new or worse numbness, weakness, tingling, or movement problems in your arms or legs.  °You develop increasing pain which is uncontrolled with medications.  °You have change in bowel or bladder function, or other concerns. ° ° ° °

## 2017-07-03 NOTE — ED Notes (Signed)
Patient has HTN, has not taken her meds this morning.

## 2017-07-03 NOTE — ED Triage Notes (Signed)
Reports having sudden onset of neck pain during a meeting on Thursday.  Having spasms and stiffness in neck since.

## 2017-07-03 NOTE — ED Provider Notes (Signed)
[facility] EMERGENCY DEPARTMENT Provider Note   CSN: [id number] Arrival date & time: 07/03/17  0631     History   Chief Complaint Chief Complaint  Patient presents with  . Neck Pain    HPI [name] is a 46 y.o. female.  The history is provided by the patient.  Neck Pain   The current episode started more than 2 days ago. The problem occurs constantly. The problem has been gradually worsening. The pain is associated with nothing. There has been no fever. The pain is present in the generalized neck. The quality of the pain is described as aching. The pain is severe. The symptoms are aggravated by bending, twisting and position. Pertinent negatives include no chest pain, no headaches, no bowel incontinence, no bladder incontinence, no paresis and no weakness. She has tried heat for the symptoms. The treatment provided mild relief.   Pt reports onset of neck pain while at meeting Denies fall/trauma She reports it is worse with movement No fever/vomiting No significant HA No focal weakness No cp/sob  Past Medical History:  Diagnosis Date  . Hypertension   . Hypoglycemia   . Radiation 10/21/2015-10/23/2015   Anterior neck area 12 gray in 3 fractions  . Teratoma     Patient Active Problem List   Diagnosis Date Noted  . Morbid obesity ([place]) 02/18/2017  . OSA (obstructive sleep apnea) 01/24/2017  . 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 Snoring 10/22/2016  . Nocturnal dyspnea/choking   10/22/2016  . Keloid of skin 09/25/2015  . Essential hypertension, benign 09/27/2014  . Teratoma 08/07/2014  . Severe hypertension 06/08/2013  . HYPERLIPIDEMIA, MILD, WITH LOW HDL 07/23/2009  . SHORTNESS OF BREATH 02/11/2009  . HYPERTENSION 05/07/2008  . KELOID SCAR 05/07/2008  . HEADACHE 05/22/2007    Past Surgical History:  Procedure Laterality Date  . ABDOMINAL HERNIA REPAIR    . ABDOMINAL HYSTERECTOMY    . CESAREAN SECTION    . CRYOTHERAPY   2013   to neck  . DERMOID CYST  EXCISION    . ECTOPIC PREGNANCY SURGERY    . EXCISION OF LARGE KELOIDS SEVERE RIGHT AND LEFT NECK WITH PLASTIC RECONSTRUCTION Bilateral 10/20/2015   Performed by Cristine Polio, MD at [facility]  . HAND SURGERY     amputation of right index finger    OB History    No data available       Home Medications    Prior to Admission medications   Medication Sig Start Date End Date Taking? Authorizing Provider  Chromium 200 MCG CAPS Take 200 mcg by mouth daily. Reported on 10/23/2015    [provider]  co-enzyme Q-10 30 MG capsule Take 30 mg by mouth 3 (three) times daily.    [provider]  cyclobenzaprine (FLEXERIL) 5 MG tablet Take 1 tablet (5 mg total) 3 (three) times daily as needed by mouth for muscle spasms. 07/03/17   Ripley Fraise, MD  diclofenac sodium (VOLTAREN) 1 % GEL Apply 4 g topically 4 (four) times daily. To area as directed for joint pain. 10/22/16   Panosh, Standley Brooking, MD  ibuprofen (ADVIL,MOTRIN) 600 MG tablet Take 1 tablet (600 mg total) every 8 (eight) hours as needed by mouth for moderate pain. 07/03/17   Ripley Fraise, MD  Olmesartan-Amlodipine-HCTZ 40-5-25 MG TABS Take 1 tablet by mouth daily. 10/07/16   Panosh, Standley Brooking, MD    Family History Family History  Problem Relation Age of Onset  .  Hypertension Other     Social History Social History   Tobacco Use  . Smoking status: Never Smoker  . Smokeless tobacco: Never Used  Substance Use Topics  . Alcohol use: Yes    Comment: socially  . Drug use: No     Allergies   Tylox [oxycodone-acetaminophen]   Review of Systems Review of Systems  Constitutional: Negative for fever.  Eyes: Negative for visual disturbance.  Respiratory: Negative for shortness of breath.   Cardiovascular: Negative for chest pain.  Gastrointestinal: Negative for bowel incontinence.  Genitourinary: Negative for bladder incontinence.  Musculoskeletal: Positive for  neck pain.  Neurological: Negative for weakness and headaches.  All other systems reviewed and are negative.    Physical Exam Updated Vital Signs BP (!) 185/118   Pulse 78   Temp 98.3 F (36.8 C) (Oral)   Resp 18   Ht 1.829 m (6')   Wt 111.6 kg (246 lb)   SpO2 99%   BMI 33.36 kg/m   Physical Exam CONSTITUTIONAL: Well developed/well nourished HEAD: Normocephalic/atraumatic EYES: EOMI/PERRL ENMT: Mucous membranes moist NECK: supple no meningeal signs SPINE/BACK:diffuse cervical spinal/paraspinal tenderness CV: S1/S2 noted, no murmurs/rubs/gallops noted LUNGS: Lungs are clear to auscultation bilaterally, no apparent distress ABDOMEN: soft, nontender NEURO: Pt is awake/alert/appropriate, moves all extremitiesx4.  No facial droop.   Equal power (5/5) with hand grip, wrist flex/extension, elbow flex/extension.  No focal sensory deficit to light touch is noted in either UE.   Equal (2+) biceps/brachioradialis reflex in bilateral UE EXTREMITIES: pulses normal/equal, full ROM SKIN: warm, color normal PSYCH: no abnormalities of mood noted, alert and oriented to situation   ED Treatments / Results  Labs (all labs ordered are listed, but only abnormal results are displayed) Labs Reviewed - No data to display  EKG  EKG Interpretation None       Radiology No results found.  Procedures Procedures (including critical care time)  Medications Ordered in ED Medications  ibuprofen (ADVIL,MOTRIN) tablet 800 mg (800 mg Oral Given 07/03/17 0700)     Initial Impression / Assessment and Plan / ED Course  I have reviewed the triage vital signs and the nursing notes.      Pt stable No neuro deficits No significant At this point, my suspicion for acute neurologic/vascular emergency is low Will treat as muscle spasm We discussed strict ER return precautions   Final Clinical Impressions(s) / ED Diagnoses   Final diagnoses:  Muscle spasms of neck    ED Discharge  Orders        Ordered    ibuprofen (ADVIL,MOTRIN) 600 MG tablet  Every 8 hours PRN     07/03/17 0701    cyclobenzaprine (FLEXERIL) 5 MG tablet  3 times daily PRN     07/03/17 0701       Ripley Fraise, MD 07/03/17 [phone]

## 2017-07-03 NOTE — ED Notes (Signed)
ED Provider at bedside. 

## 2017-08-10 ENCOUNTER — Other Ambulatory Visit: Payer: Self-pay | Admitting: Adult Health

## 2017-08-10 ENCOUNTER — Ambulatory Visit (INDEPENDENT_AMBULATORY_CARE_PROVIDER_SITE_OTHER)
Admission: RE | Admit: 2017-08-10 | Discharge: 2017-08-10 | Disposition: A | Discharge status: Home / Self Care | Payer: BLUE CROSS/BLUE SHIELD | Source: Ambulatory Visit | Attending: Adult Health | Admitting: Adult Health

## 2017-08-10 ENCOUNTER — Telehealth: Payer: Self-pay | Admitting: Family Medicine

## 2017-08-10 ENCOUNTER — Ambulatory Visit: Payer: BLUE CROSS/BLUE SHIELD | Admitting: Adult Health

## 2017-08-10 VITALS — BP 136/94 | Temp 98.1°F | Wt 250.0 lb

## 2017-08-10 DIAGNOSIS — M549 Dorsalgia, unspecified: Secondary | ICD-10-CM | POA: Insufficient documentation

## 2017-08-10 LAB — POC URINALSYSI DIPSTICK (AUTOMATED)
Bilirubin, UA: NEGATIVE
Glucose, UA: NEGATIVE
KETONES UA: NEGATIVE
LEUKOCYTES UA: NEGATIVE
Nitrite, UA: NEGATIVE
Protein, UA: NEGATIVE
Urobilinogen, UA: 0.2 E.U./dL
pH, UA: 5.5 (ref 5.0–8.0)

## 2017-08-10 IMAGING — DX DG ABDOMEN 1V
2 series · 2 of 2 positions shown · non-contrast
Comparison: Abdominopelvic CT scan [DATE]

CLINICAL DATA: Lower abdominal and back pain with urinary
frequency, diaphoresis. Duration of symptoms 2-3 weeks.

EXAM:
ABDOMEN - 1 VIEW

[abdomen kub (1 of 2)]
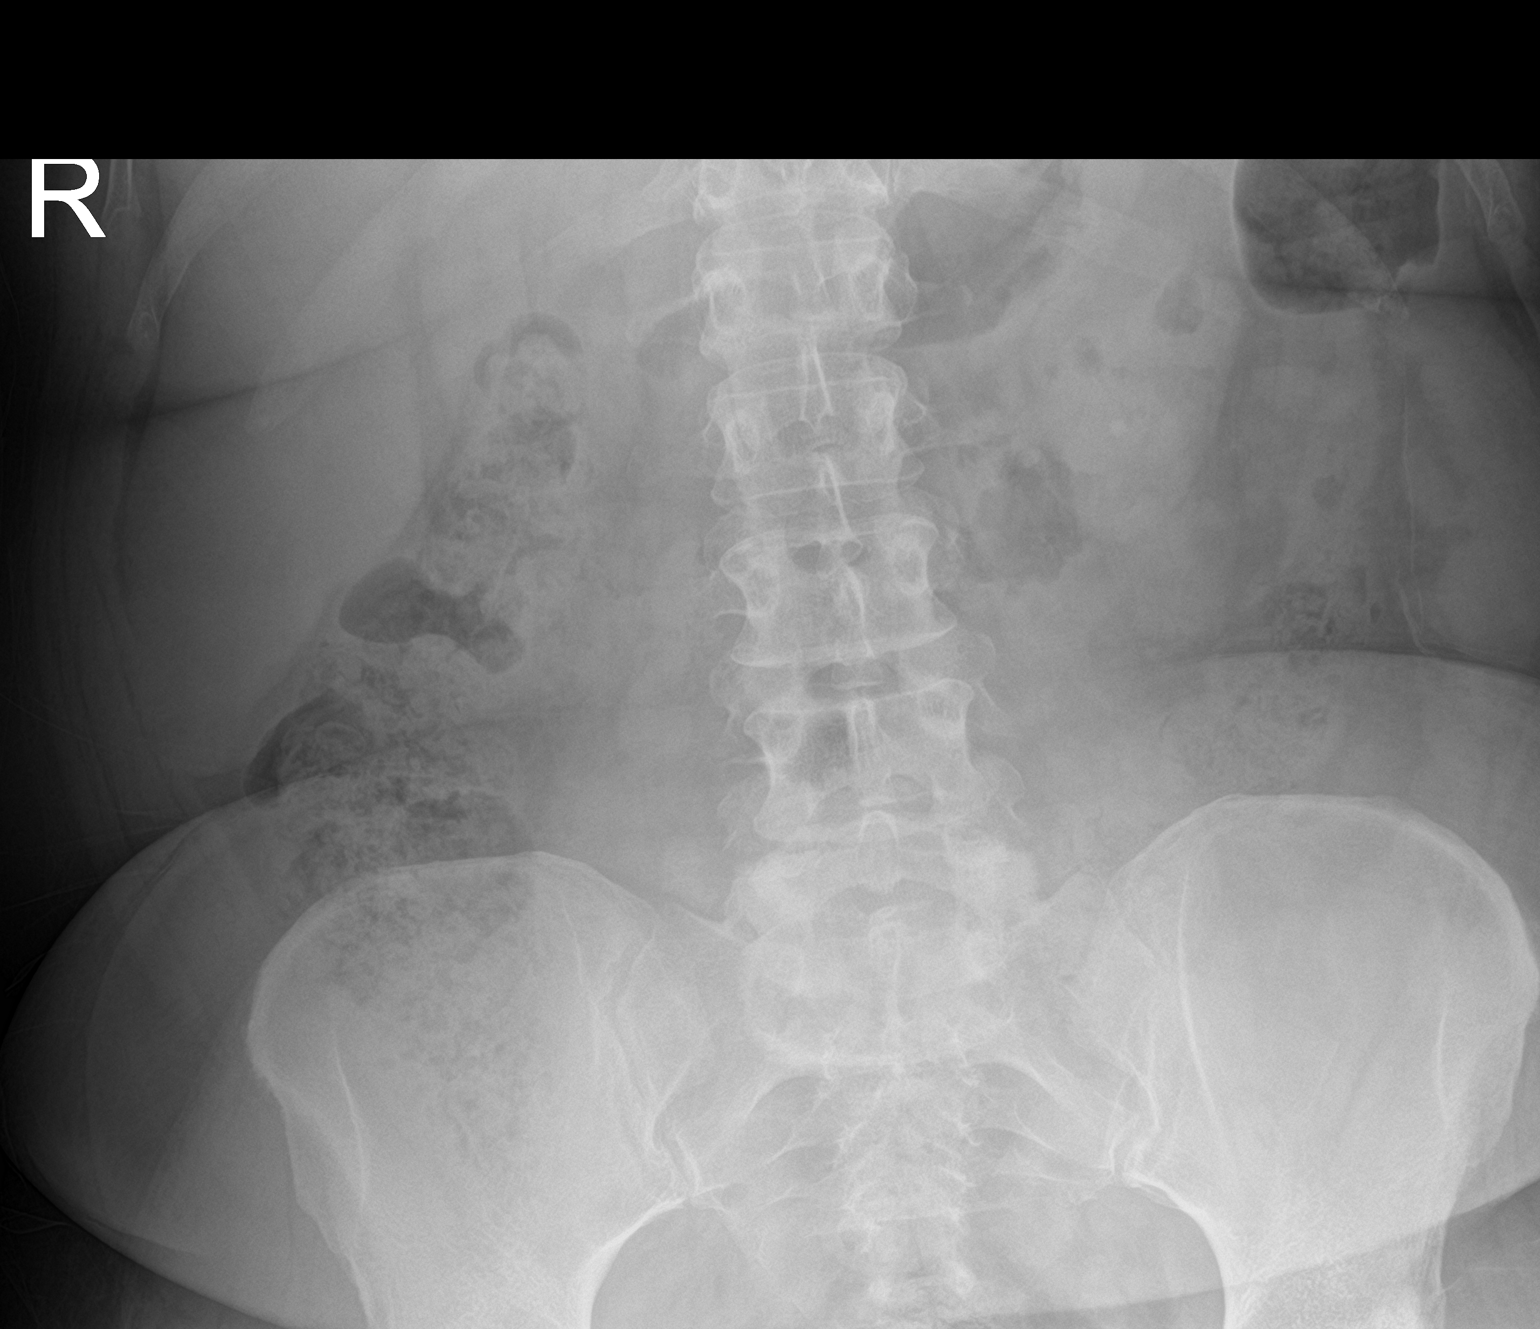

[abdomen kub (2 of 2)]
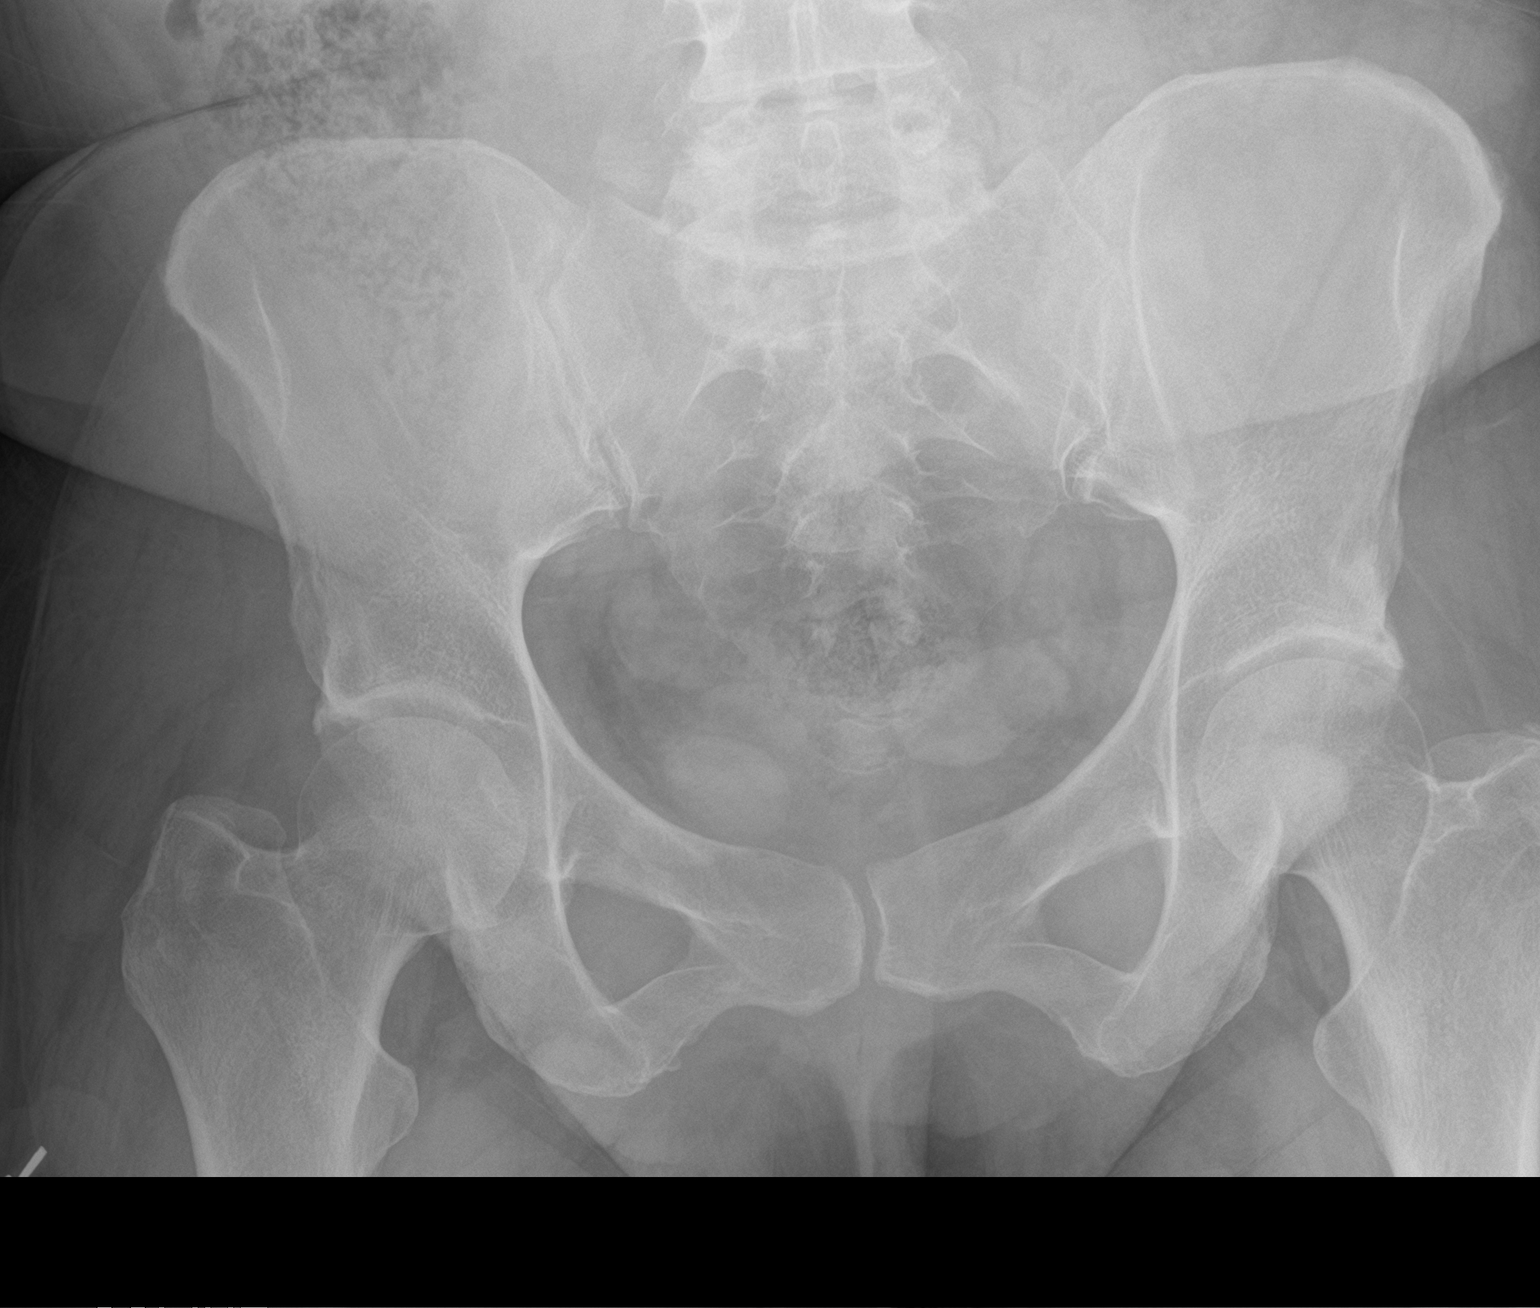

[2 of 2 positions shown; findings below may reference images not displayed]

FINDINGS: There is an approximately 3 mm diameter lower pole kidney stone on
the left. A somewhat larger faint calcification projects in the
expected location of the ureteropelvic junction on the left. No
right-sided kidney stones are observed. No ureteral or bladder
stones are demonstrated. The bowel gas pattern is normal. There
degenerative changes of the lower lumbar spine.
IMPRESSION: Lower pole kidney stone on the left and possible left UPJ stone as
well.

## 2017-08-10 MED ORDER — TAMSULOSIN HCL 0.4 MG PO CAPS: 0.4000 mg | ORAL_CAPSULE | Freq: Every day | ORAL | 3 refills | Status: DC

## 2017-08-10 NOTE — Progress Notes (Signed)
Subjective:    Patient ID: [name], female    DOB: 07-18-1971, 46 y.o.   MRN: [id number]  HPI  46 year old female who  has a past medical history of Hypertension, Hypoglycemia, Radiation (10/21/2015-10/23/2015), and Teratoma. She presents to the office today for urinary symptoms. She reports her symptoms include that of frequency and urgency. She denies amy dysuria or hematuria. She does report a " pressure like" discomfort in her lower back. This discomfort is intermittent.   She denies any history of kidney stones but does have a family history of kidney stones with her father.   She denies any fevers, chills, or abdominal pain. She has been experiencing intermittent episodes of hot flashes with sweating  Review of Systems See HPI   Past Medical History:  Diagnosis Date  . Hypertension   . Hypoglycemia   . Radiation 10/21/2015-10/23/2015   Anterior neck area 12 gray in 3 fractions  . Teratoma     Social History   Socioeconomic History  . Marital status: Married    Spouse name: Not on file  . Number of children: 2  . Years of education: Not on file  . Highest education level: Not on file  Social Needs  . Financial resource strain: Not on file  . Food insecurity - worry: Not on file  . Food insecurity - inability: Not on file  . Transportation needs - medical: Not on file  . Transportation needs - non-medical: Not on file  Occupational History  . Occupation: Therapist, music  Tobacco Use  . Smoking status: Never Smoker  . Smokeless tobacco: Never Used  Substance and Sexual Activity  . Alcohol use: Yes    Comment: socially  . Drug use: No  . Sexual activity: Yes    Birth control/protection: Surgical  Other Topics Concern  . Not on file  Social History Narrative  . Not on file    Past Surgical History:  Procedure Laterality Date  . ABDOMINAL HERNIA REPAIR    . ABDOMINAL HYSTERECTOMY    . CESAREAN SECTION    . CRYOTHERAPY  2013   to neck  . DERMOID  CYST  EXCISION    . ECTOPIC PREGNANCY SURGERY    . EXCISION MASS NECK Bilateral 10/20/2015   Procedure: EXCISION OF LARGE KELOIDS SEVERE RIGHT AND LEFT NECK WITH PLASTIC RECONSTRUCTION;  Surgeon: Cristine Polio, MD;  Location: [facility];  Service: Plastics;  Laterality: Bilateral;  . HAND SURGERY     amputation of right index finger    Family History  Problem Relation Age of Onset  . Hypertension Other     Allergies  Allergen Reactions  . Tylox [Oxycodone-Acetaminophen] Swelling    Caused face to swell    Current Outpatient Medications on File Prior to Visit  Medication Sig Dispense Refill  . Chromium 200 MCG CAPS Take 200 mcg by mouth daily. Reported on 10/23/2015    . co-enzyme Q-10 30 MG capsule Take 30 mg by mouth 3 (three) times daily.    . cyclobenzaprine (FLEXERIL) 5 MG tablet Take 1 tablet (5 mg total) 3 (three) times daily as needed by mouth for muscle spasms. 10 tablet 0  . diclofenac sodium (VOLTAREN) 1 % GEL Apply 4 g topically 4 (four) times daily. To area as directed for joint pain. 4 Tube 1  . ibuprofen (ADVIL,MOTRIN) 600 MG tablet Take 1 tablet (600 mg total) every 8 (eight) hours as needed by mouth for moderate pain. 21 tablet  0  . Olmesartan-Amlodipine-HCTZ 40-5-25 MG TABS Take 1 tablet by mouth daily. 90 tablet 1   No current facility-administered medications on file prior to visit.     BP (!) 136/94   Temp 98.1 F (36.7 C) (Oral)   Wt 250 lb (113.4 kg)   BMI 33.91 kg/m       Objective:   Physical Exam  Constitutional: She is oriented to person, place, and time. She appears well-developed and well-nourished. No distress.  Cardiovascular: Normal rate, regular rhythm, normal heart sounds and intact distal pulses. Exam reveals no gallop and no friction rub.  No murmur heard. Pulmonary/Chest: Effort normal and breath sounds normal. No respiratory distress. She has no wheezes. She has no rales.  Abdominal: Soft. Bowel sounds are normal. She  exhibits no distension and no mass. There is generalized tenderness. There is CVA tenderness. There is no rigidity, no rebound and no guarding.  Neurological: She is alert and oriented to person, place, and time.  Skin: Skin is warm. No rash noted. She is diaphoretic. No erythema. No pallor.  Psychiatric: She has a normal mood and affect. Her behavior is normal. Judgment and thought content normal.  Nursing note and vitals reviewed.     Assessment & Plan:  1. Other acute back pain - Will get KUB to r/o kidney stone - POCT Urinalysis Dipstick (Automated)  2 + blood.  - CBC with Differential/Platelet - Basic Metabolic Panel - DG Abd 1 View; Future - Culture, Urine - Will treat as needed when KUB and labs come back   Dorothyann Peng, NP
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
## 2017-08-11 LAB — URINE CULTURE
MICRO NUMBER:: 81448436
Result:: NO GROWTH
SPECIMEN QUALITY:: ADEQUATE

## 2017-10-16 ENCOUNTER — Telehealth: Payer: BLUE CROSS/BLUE SHIELD | Admitting: Family

## 2017-10-16 DIAGNOSIS — R6889 Other general symptoms and signs: Secondary | ICD-10-CM

## 2017-10-16 NOTE — Progress Notes (Signed)
E visit for Flu like symptoms   We are sorry that you are not feeling well.  Here is how we plan to help! Based on what you have shared with me it looks like you may have a respiratory virus that may be influenza.  Influenza or "the flu" is   an infection caused by a respiratory virus. The flu virus is highly contagious and persons who did not receive their yearly flu vaccination may "catch" the flu from close contact.  We have anti-viral medications to treat the viruses that cause this infection. They are not a "cure" and only shorten the course of the infection. These prescriptions are most effective when they are given within the first 2 days of "flu" symptoms. Antiviral medication are indicated if you have a high risk of complications from the flu. You should  also consider an antiviral medication if you are in close contact with someone who is at risk. These medications can help patients avoid complications from the flu  but have side effects that you should know. Possible side effects from Tamiflu or oseltamivir include nausea, vomiting, diarrhea, dizziness, headaches, eye redness, sleep problems or other respiratory symptoms. You should not take Tamiflu if you have an allergy to oseltamivir or any to the ingredients in Tamiflu.  Based upon your symptoms and potential risk factors I recommend that you follow the flu symptoms recommendation that I have listed below.  ANYONE WHO HAS FLU SYMPTOMS SHOULD: . Stay home. The flu is highly contagious and going out or to work exposes others! . Be sure to drink plenty of fluids. Water is fine as well as fruit juices, sodas and electrolyte beverages. You may want to stay away from caffeine or alcohol. If you are nauseated, try taking small sips of liquids. How do you know if you are getting enough fluid? Your urine should be a pale yellow or almost colorless. . Get rest. . Taking a steamy shower or using a humidifier may help nasal congestion and ease  sore throat pain. Using a saline nasal spray works much the same way. . Cough drops, hard candies and sore throat lozenges may ease your cough. . Line up a caregiver. Have someone check on you regularly.   GET HELP RIGHT AWAY IF: . You cannot keep down liquids or your medications. . You become short of breath . Your fell like you are going to pass out or loose consciousness. . Your symptoms persist after you have completed your treatment plan MAKE SURE YOU   Understand these instructions.  Will watch your condition.  Will get help right away if you are not doing well or get worse.  Your e-visit answers were reviewed by a board certified advanced clinical practitioner to complete your personal care plan.  Depending on the condition, your plan could have included both over the counter or prescription medications.  If there is a problem please reply  once you have received a response from your provider.  Your safety is important to [place].  If you have drug allergies check your prescription carefully.    You can use MyChart to ask questions about today's visit, request a non-urgent call back, or ask for a work or school excuse for 24 hours related to this e-Visit. If it has been greater than 24 hours you will need to follow up with your provider, or enter a new e-Visit to address those concerns.  You will get an e-mail in the next two days asking about  your experience.  I hope that your e-visit has been valuable and will speed your recovery. Thank you for using e-visits.

## 2017-10-23 ENCOUNTER — Telehealth (HOSPITAL_COMMUNITY): Payer: Self-pay | Admitting: *Deleted

## 2017-10-23 ENCOUNTER — Ambulatory Visit (HOSPITAL_COMMUNITY)
Admission: EM | Admit: 2017-10-23 | Discharge: 2017-10-23 | Disposition: A | Discharge status: Home / Self Care | Payer: BLUE CROSS/BLUE SHIELD | Attending: Physician Assistant | Admitting: Physician Assistant

## 2017-10-23 ENCOUNTER — Encounter (HOSPITAL_COMMUNITY): Payer: Self-pay | Admitting: Emergency Medicine

## 2017-10-23 DIAGNOSIS — J069 Acute upper respiratory infection, unspecified: Secondary | ICD-10-CM | POA: Diagnosis not present

## 2017-10-23 DIAGNOSIS — J22 Unspecified acute lower respiratory infection: Secondary | ICD-10-CM

## 2017-10-23 MED ORDER — BENZONATATE 200 MG PO CAPS: 200.0000 mg | ORAL_CAPSULE | Freq: Three times a day (TID) | ORAL | 0 refills | Status: DC | PRN

## 2017-10-23 MED ORDER — AZITHROMYCIN 250 MG PO TABS: ORAL_TABLET | ORAL | 0 refills | Status: AC

## 2017-10-23 MED ORDER — AZITHROMYCIN 250 MG PO TABS: ORAL_TABLET | ORAL | 0 refills | Status: DC

## 2017-10-23 NOTE — ED Triage Notes (Signed)
Pt here with URI sx x 1 week 

## 2017-10-23 NOTE — Discharge Instructions (Signed)
It appears that you have an allergy to narcotics.  Unfortunately I cannot prescribe you narcotic cough syrup.  Please start the antibiotic tonight and take as directed.  Make sure that you are taking some form of Delsym at night.  Okay to take the Tessalon as prescribed which should help with cough as well.  Make sure that you are taking Tylenol 1000 mg every 8 hours.

## 2017-10-23 NOTE — ED Provider Notes (Signed)
10/23/2017 6:38 PM   DOB: 05-Dec-1970 / MRN: [id number]  SUBJECTIVE:  [name] is a 47 y.o. female presenting for cough, nasal congestion.  States that symptoms have been present now for 11 days.  She felt that she was getting better at about day 7 and then suddenly seemed to get worse again.  States the cough is keeping her up at night and tells me she got very little sleep last night.  She also has cold chills.  She is allergic to tylox [oxycodone-acetaminophen].   She  has a past medical history of Hypertension, Hypoglycemia, Radiation (10/21/2015-10/23/2015), and Teratoma.    She  reports that  has never smoked. she has never used smokeless tobacco. She reports that she drinks alcohol. She reports that she does not use drugs. She  reports that she currently engages in sexual activity. She reports using the following method of birth control/protection: Surgical. The patient  has a past surgical history that includes Abdominal hysterectomy; Cesarean section; Dermoid cyst  excision; Hand surgery; Ectopic pregnancy surgery; Cryotherapy (2013); Abdominal hernia repair; and Excision mass neck (Bilateral, 10/20/2015).  Her family history includes Hypertension in her other.  Review of Systems  Respiratory: Negative for cough.   Cardiovascular: Negative for chest pain, palpitations, orthopnea and leg swelling.    OBJECTIVE:  BP (!) 136/101 (BP Location: Left Arm)   Pulse 85   Temp 98.4 F (36.9 C) (Oral)   Resp 18   SpO2 100%   Physical Exam  Constitutional: She is active.  Non-toxic appearance.  HENT:  Right Ear: Hearing, tympanic membrane, external ear and ear canal normal.  Left Ear: Hearing, tympanic membrane, external ear and ear canal normal.  Nose: Nose normal. Right sinus exhibits no maxillary sinus tenderness and no frontal sinus tenderness. Left sinus exhibits no maxillary sinus tenderness and no frontal sinus tenderness.  Mouth/Throat: Uvula is midline, oropharynx is clear and  moist and mucous membranes are normal. Mucous membranes are not dry. No oropharyngeal exudate, posterior oropharyngeal edema or tonsillar abscesses.  Cardiovascular: Normal rate, regular rhythm, S1 normal, S2 normal, normal heart sounds and intact distal pulses. Exam reveals no gallop, no friction rub and no decreased pulses.  No murmur heard. Pulmonary/Chest: Effort normal. No stridor. No tachypnea. No respiratory distress. She has no wheezes. She has no rales.  Abdominal: She exhibits no distension.  Musculoskeletal: She exhibits no edema.  Lymphadenopathy:       Head (right side): No submandibular and no tonsillar adenopathy present.       Head (left side): No submandibular and no tonsillar adenopathy present.    She has no cervical adenopathy.  Neurological: She is alert.  Skin: Skin is warm and dry. She is not diaphoretic. No pallor.    No results found for this or any previous visit (from the past 72 hour(s)).  No results found.  ASSESSMENT AND PLAN:  No orders of the defined types were placed in this encounter.    URI with cough and congestion - She has had some upper and lower respiratory tract symptoms now for about 11 days.  She is not getting better and in fact has had a double sickening.  I am going to cover for a atypical respiratory tract infection.  Advise she come back if she has not feeling better in a few days.  Lower respiratory tract infection      The patient is advised to call or return to clinic if she does not see an improvement  in symptoms, or to seek the care of the closest emergency department if she worsens with the above plan.   Philis Fendt, MHS, PA-C 10/23/2017 6:38 PM    Theresa Coop, PA-C 10/23/17 1839

## 2017-12-02 ENCOUNTER — Other Ambulatory Visit: Payer: Self-pay | Admitting: Internal Medicine

## 2017-12-05 NOTE — Telephone Encounter (Signed)
Please advise.  LOV w/ PCP 12/31/2016. No future appts scheduled.

## 2017-12-05 NOTE — Telephone Encounter (Signed)
Overdue for Med check and medication lab monitoring  .  Make appt   For visit  Med check or PV  Needed before further refills   Can refill 30 days worth  In the interim until  She an come in

## 2017-12-07 NOTE — Telephone Encounter (Signed)
Pt called and scheduled appt 12/20/17 for cpe. Pt is now out of medication. Please send in today. It was requested 12/02/17.

## 2017-12-12 ENCOUNTER — Ambulatory Visit: Payer: BLUE CROSS/BLUE SHIELD | Admitting: Adult Health

## 2017-12-19 NOTE — Progress Notes (Signed)
Chief Complaint  Patient presents with  . Annual Exam    Pt wants metal testing. / Discuss BP meds - pt having hair loss from HCTZ    HPI: Patient  [name]  47 y.o. comes in today for Preventive Health Care visit  And Chronic disease management  Med problems and concerns   OSA not using cpap regluarly  Keloids see dr Towanda Malkin  No recnet rx  Hx radiatino neck   Noted balding   About a year  Of    Top of head      valding in large spot . ? If could be from hctz and should she stop this med   Neg fam balding  Mom side?  Still having sob doe  And  Sweats with activity   See last year   Echo and  pfts done   bp seems controlled   Hx of  hysteretomy ? If  Total   Had a teratoma when younger   Health Maintenance  Topic Date Due  . HIV Screening  06/13/1985  . TETANUS/TDAP  08/17/2015  . PAP SMEAR  08/21/2016  . INFLUENZA VACCINE  03/16/2018   Health Maintenance Review LIFESTYLE:  Exercise:   Walking at work  Tobacco/ETS: no Alcohol:  Social once a while  Sugar beverages:1/2 ;12 tea and lemonade  Sleep:  5-6 avera  Drug use: no HH of   3    Pet dog Work: ft 10 -12 days   transition        ROS:   sweatas ongogin sob  All throughout   Over exerted   Numb are metatarsal area right foot no sig pain   Has had numbnesss over metatarsal right foot off and on when walks no pain  weakness GEN/ HEENT: No fever, significant weight changes sweats headaches vision problems hearing changes, CV/ PULM; No chest pain shortness of breath cough, syncope,edema  change in exercise tolerance. GI /GU: No adominal pain, vomiting, change in bowel habits. No blood in the stool. No significant GU symptoms. SKIN/HEME: ,no acute skin rashes suspicious lesions or bleeding. No lymphadenopathy, nodules, masses.  NEURO/ PSYCH:  No neurologic signs such as weakness numbness. No depression anxiety. IMM/ Allergy: No unusual infections.  Allergy .   REST of 12 system review negative except as per  HPI   Past Medical History:  Diagnosis Date  . Hypertension   . Hypoglycemia   . Radiation 10/21/2015-10/23/2015   Anterior neck area 12 gray in 3 fractions  . Teratoma     Past Surgical History:  Procedure Laterality Date  . ABDOMINAL HERNIA REPAIR    . ABDOMINAL HYSTERECTOMY    . CESAREAN SECTION    . CRYOTHERAPY  2013   to neck  . DERMOID CYST  EXCISION    . ECTOPIC PREGNANCY SURGERY    . EXCISION MASS NECK Bilateral 10/20/2015   Procedure: EXCISION OF LARGE KELOIDS SEVERE RIGHT AND LEFT NECK WITH PLASTIC RECONSTRUCTION;  Surgeon: Cristine Polio, MD;  Location: [facility];  Service: Plastics;  Laterality: Bilateral;  . HAND SURGERY     amputation of right index finger    Family History  Problem Relation Age of Onset  . Hypertension Other     Social History   Socioeconomic History  . Marital status: Married    Spouse name: Not on file  . Number of children: 2  . Years of education: Not on file  . Highest education level: Not on  file  Occupational History  . Occupation: Therapist, music  Social Needs  . Financial resource strain: Not on file  . Food insecurity:    Worry: Not on file    Inability: Not on file  . Transportation needs:    Medical: Not on file    Non-medical: Not on file  Tobacco Use  . Smoking status: Never Smoker  . Smokeless tobacco: Never Used  Substance and Sexual Activity  . Alcohol use: Yes    Comment: socially  . Drug use: No  . Sexual activity: Yes    Birth control/protection: Surgical  Lifestyle  . Physical activity:    Days per week: Not on file    Minutes per session: Not on file  . Stress: Not on file  Relationships  . Social connections:    Talks on phone: Not on file    Gets together: Not on file    Attends religious service: Not on file    Active member of club or organization: Not on file    Attends meetings of clubs or organizations: Not on file    Relationship status: Not on file  Other Topics Concern   . Not on file  Social History Narrative  . Not on file    Outpatient Medications Prior to Visit  Medication Sig Dispense Refill  . diclofenac sodium (VOLTAREN) 1 % GEL Apply 4 g topically 4 (four) times daily. To area as directed for joint pain. 4 Tube 1  . ibuprofen (ADVIL,MOTRIN) 600 MG tablet Take 1 tablet (600 mg total) every 8 (eight) hours as needed by mouth for moderate pain. 21 tablet 0  . cyclobenzaprine (FLEXERIL) 5 MG tablet Take 1 tablet (5 mg total) 3 (three) times daily as needed by mouth for muscle spasms. (Patient not taking: Reported on 12/20/2017) 10 tablet 0  . Olmesartan-amLODIPine-HCTZ 40-5-25 MG TABS TAKE 1 TABLET BY MOUTH DAILY (Patient not taking: Reported on 12/20/2017) 30 tablet 0  . benzonatate (TESSALON) 200 MG capsule Take 1 capsule (200 mg total) by mouth 3 (three) times daily as needed for cough. (Patient not taking: Reported on 12/20/2017) 20 capsule 0  . Chromium 200 MCG CAPS Take 200 mcg by mouth daily. Reported on 10/23/2015    . co-enzyme Q-10 30 MG capsule Take 30 mg by mouth 3 (three) times daily.    . tamsulosin (FLOMAX) 0.4 MG CAPS capsule Take 1 capsule (0.4 mg total) by mouth daily. (Patient not taking: Reported on 12/20/2017) 30 capsule 3   No facility-administered medications prior to visit.      EXAM:  BP 118/82 (BP Location: Left Arm, Patient Position: Sitting, Cuff Size: Large)   Pulse 88   Temp 98.3 F (36.8 C) (Oral)   Ht 5' 10.67" (1.795 m)   Wt 248 lb 1.6 oz (112.5 kg)   BMI 34.93 kg/m   Body mass index is 34.93 kg/m. Wt Readings from Last 3 Encounters:  12/20/17 248 lb 1.6 oz (112.5 kg)  08/10/17 250 lb (113.4 kg)  07/03/17 246 lb (111.6 kg)    Physical Exam: Vital signs reviewed SWN:IOEV is a well-developed well-nourished alert cooperative    who appearsr stated age in no acute distress.  HEENT: normocephalic atraumatic , Eyes: PERRL EOM's full, conjunctiva clear, Nares: paten,t no deformity discharge or tenderness., Ears: no  deformity EAC's clear TMs with normal landmarks. Mouth: clear OP, no lesions, edema.  Moist mucous membranes. Dentition in adequate repair. NECK: supple without masses, thyromegaly or bruits. CHEST/PULM:  Clear  to auscultation and percussion breath sounds equal no wheeze , rales or rhonchi. No chest wall deformities or tenderness. Breast: normal by inspection . No dimpling, discharge, masses, tenderness or discharge .  X keloids  CV: PMI is nondisplaced, S1 S2 no gallops, murmurs, rubs. Peripheral pulses are full without delay.No JVD .  ABDOMEN: Bowel sounds normal nontender  No guard or rebound, no hepato splenomegal no CVA tenderness.  No hernia. Extremtities:  No clubbing cyanosis or edema, no acute joint swelling or redness no focal atrophy NEURO:  Oriented x3, cranial nerves 3-12 appear to be intact, no obvious focal weakness,gait within normal limits no abnormal reflexes or asymmetrical SKIN: No acute rashes normal turgor, color, no bruising or petechiae.   Many 3+ keloids breast abd  And neck   .  Wearing wig   Top of head about 5-6 cm ovoid patch of baldness with few hairs  No scaling or scar   PSYCH: Oriented, good eye contact, no obvious depression anxiety, cognition and judgment appear normal. LN: no cervical axillary inguinal adenopathy  Lab Results  Component Value Date   WBC 6.7 12/20/2017   HGB 14.6 12/20/2017   HCT 42.8 12/20/2017   PLT 288.0 12/20/2017   GLUCOSE 98 12/20/2017   CHOL 228 (H) 12/20/2017   TRIG 170.0 (H) 12/20/2017   HDL 40.10 12/20/2017   LDLDIRECT 177.3 07/06/2013   LDLCALC 154 (H) 12/20/2017   ALT 24 12/20/2017   AST 18 12/20/2017   NA 139 12/20/2017   K 3.6 12/20/2017   CL 99 12/20/2017   CREATININE 0.77 12/20/2017   BUN 18 12/20/2017   CO2 32 12/20/2017   TSH 1.27 12/20/2017   HGBA1C 6.5 12/20/2017    BP Readings from Last 3 Encounters:  12/20/17 118/82  10/23/17 (!) 136/101  08/10/17 (!) 136/94    Wt Readings from Last 3 Encounters:    12/20/17 248 lb 1.6 oz (112.5 kg)  08/10/17 250 lb (113.4 kg)  07/03/17 246 lb (111.6 kg)     ASSESSMENT AND PLAN:  Discussed the following assessment and plan:  Visit for preventive health examination - Plan: Basic metabolic panel, CBC with Differential/Platelet, Hemoglobin A1c, Hepatic function panel, Lipid panel, TSH, T4, free, Testosterone, DHEA-sulfate, Follicle Stimulating Hormone  Medication management - Plan: Basic metabolic panel, CBC with Differential/Platelet, Hemoglobin A1c, Hepatic function panel, Lipid panel, TSH, T4, free, Testosterone, DHEA-sulfate, Follicle Stimulating Hormone  Morbid obesity ([place]) - Plan: Basic metabolic panel, CBC with Differential/Platelet, Hemoglobin A1c, Hepatic function panel, Lipid panel, TSH, T4, free, Testosterone, DHEA-sulfate, Follicle Stimulating Hormone  Essential hypertension, benign - controlled  - Plan: Basic metabolic panel, CBC with Differential/Platelet, Hemoglobin A1c, Hepatic function panel, Lipid panel, TSH, T4, free, Testosterone, DHEA-sulfate, Follicle Stimulating Hormone, Ambulatory referral to Cardiology  Balding - top of head  larege round area nor totally bald  . frontal area still has hair   ? if could be from med  consdier changes  need derm input if not obvious cause  - Plan: Basic metabolic panel, CBC with Differential/Platelet, Hemoglobin A1c, Hepatic function panel, Lipid panel, TSH, T4, free, Testosterone, DHEA-sulfate, Follicle Stimulating Hormone  Sweats, menopausal ? exertional - Plan: Basic metabolic panel, CBC with Differential/Platelet, Hemoglobin A1c, Hepatic function panel, Lipid panel, TSH, T4, free, Testosterone, DHEA-sulfate, Follicle Stimulating Hormone, Ambulatory referral to Cardiology  Keloid of skin - severe condition hx of rx with radtionation - Plan: Basic metabolic panel, CBC with Differential/Platelet, Hemoglobin A1c, Hepatic function panel, Lipid panel, TSH, T4, free, Testosterone, DHEA-sulfate,  Follicle  Stimulating Hormone  Shortness of breath - w acativity and sweating   with exertion ?  advisability of stress testing  hsa some risk  see past echo and ht  - Plan: Basic metabolic panel, CBC with Differential/Platelet, Hemoglobin A1c, Hepatic function panel, Lipid panel, TSH, T4, free, Testosterone, DHEA-sulfate, Follicle Stimulating Hormone, Ambulatory referral to Cardiology  OSA (obstructive sleep apnea) - not using  cpap  all the time   Personal history of radiation therapy - neck for keloids  Will plan  Cards  Referral advisability ro stress test etc with doe and sweating that may not be menopausal ( not sure if had surgical menopause of   Partial hysterectomy) Consider derm consult about the bald area  And advice about med contributing and changes   Patient Care Team: Timmey Lamba, Standley Brooking, MD as PCP - General (Internal Medicine) Patient Instructions  Will notify you  of labs when available. Will be contacted by cardiology about advisability of further eval  For the  Sweating and  Dyspnea with activity .   possible stress testing   Checking other labs  And depending on  results may have you see dermatology about the   Balding on top of head .    Or medication that could be a cause . Fu after all evaluation  As to further   Help.     Health Maintenance, Female Adopting a healthy lifestyle and getting preventive care can go a long way to promote health and wellness. Talk with your health care provider about what schedule of regular examinations is right for you. This is a good chance for you to check in with your provider about disease prevention and staying healthy. In between checkups, there are plenty of things you can do on your own. Experts have done a lot of research about which lifestyle changes and preventive measures are most likely to keep you healthy. Ask your health care provider for more information. Weight and diet Eat a healthy diet  Be sure to include plenty of vegetables,  fruits, low-fat dairy products, and lean protein.  Do not eat a lot of foods high in solid fats, added sugars, or salt.  Get regular exercise. This is one of the most important things you can do for your health. ? Most adults should exercise for at least 150 minutes each week. The exercise should increase your heart rate and make you sweat (moderate-intensity exercise). ? Most adults should also do strengthening exercises at least twice a week. This is in addition to the moderate-intensity exercise.  Maintain a healthy weight  Body mass index (BMI) is a measurement that can be used to identify possible weight problems. It estimates body fat based on height and weight. Your health care provider can help determine your BMI and help you achieve or maintain a healthy weight.  For females 35 years of age and older: ? A BMI below 18.5 is considered underweight. ? A BMI of 18.5 to 24.9 is normal. ? A BMI of 25 to 29.9 is considered overweight. ? A BMI of 30 and above is considered obese.  Watch levels of cholesterol and blood lipids  You should start having your blood tested for lipids and cholesterol at 48 years of age, then have this test every 5 years.  You may need to have your cholesterol levels checked more often if: ? Your lipid or cholesterol levels are high. ? You are older than 48 years of age. ? You are  at high risk for heart disease.  Cancer screening Lung Cancer  Lung cancer screening is recommended for adults 29-50 years old who are at high risk for lung cancer because of a history of smoking.  A yearly low-dose CT scan of the lungs is recommended for people who: ? Currently smoke. ? Have quit within the past 15 years. ? Have at least a 30-pack-year history of smoking. A pack year is smoking an average of one pack of cigarettes a day for 1 year.  Yearly screening should continue until it has been 15 years since you quit.  Yearly screening should stop if you develop a  health problem that would prevent you from having lung cancer treatment.  Breast Cancer  Practice breast self-awareness. This means understanding how your breasts normally appear and feel.  It also means doing regular breast self-exams. Let your health care provider know about any changes, no matter how small.  If you are in your 20s or 30s, you should have a clinical breast exam (CBE) by a health care provider every 1-3 years as part of a regular health exam.  If you are 48 or older, have a CBE every year. Also consider having a breast X-ray (mammogram) every year.  If you have a family history of breast cancer, talk to your health care provider about genetic screening.  If you are at high risk for breast cancer, talk to your health care provider about having an MRI and a mammogram every year.  Breast cancer gene (BRCA) assessment is recommended for women who have family members with BRCA-related cancers. BRCA-related cancers include: ? Breast. ? Ovarian. ? Tubal. ? Peritoneal cancers.  Results of the assessment will determine the need for genetic counseling and BRCA1 and BRCA2 testing.  Cervical Cancer Your health care provider may recommend that you be screened regularly for cancer of the pelvic organs (ovaries, uterus, and vagina). This screening involves a pelvic examination, including checking for microscopic changes to the surface of your cervix (Pap test). You may be encouraged to have this screening done every 3 years, beginning at age 31.  For women ages 50-65, health care providers may recommend pelvic exams and Pap testing every 3 years, or they may recommend the Pap and pelvic exam, combined with testing for human papilloma virus (HPV), every 5 years. Some types of HPV increase your risk of cervical cancer. Testing for HPV may also be done on women of any age with unclear Pap test results.  Other health care providers may not recommend any screening for nonpregnant women who  are considered low risk for pelvic cancer and who do not have symptoms. Ask your health care provider if a screening pelvic exam is right for you.  If you have had past treatment for cervical cancer or a condition that could lead to cancer, you need Pap tests and screening for cancer for at least 20 years after your treatment. If Pap tests have been discontinued, your risk factors (such as having a new sexual partner) need to be reassessed to determine if screening should resume. Some women have medical problems that increase the chance of getting cervical cancer. In these cases, your health care provider may recommend more frequent screening and Pap tests.  Colorectal Cancer  This type of cancer can be detected and often prevented.  Routine colorectal cancer screening usually begins at 48 years of age and continues through 48 years of age.  Your health care provider may recommend screening at an  earlier age if you have risk factors for colon cancer.  Your health care provider may also recommend using home test kits to check for hidden blood in the stool.  A small camera at the end of a tube can be used to examine your colon directly (sigmoidoscopy or colonoscopy). This is done to check for the earliest forms of colorectal cancer.  Routine screening usually begins at age 57.  Direct examination of the colon should be repeated every 5-10 years through 48 years of age. However, you may need to be screened more often if early forms of precancerous polyps or small growths are found.  Skin Cancer  Check your skin from head to toe regularly.  Tell your health care provider about any new moles or changes in moles, especially if there is a change in a mole's shape or color.  Also tell your health care provider if you have a mole that is larger than the size of a pencil eraser.  Always use sunscreen. Apply sunscreen liberally and repeatedly throughout the day.  Protect yourself by wearing long  sleeves, pants, a wide-brimmed hat, and sunglasses whenever you are outside.  Heart disease, diabetes, and high blood pressure  High blood pressure causes heart disease and increases the risk of stroke. High blood pressure is more likely to develop in: ? People who have blood pressure in the high end of the normal range (130-139/85-89 mm Hg). ? People who are overweight or obese. ? People who are African American.  If you are 13-46 years of age, have your blood pressure checked every 3-5 years. If you are 59 years of age or older, have your blood pressure checked every year. You should have your blood pressure measured twice-once when you are at a hospital or clinic, and once when you are not at a hospital or clinic. Record the average of the two measurements. To check your blood pressure when you are not at a hospital or clinic, you can use: ? An automated blood pressure machine at a pharmacy. ? A home blood pressure monitor.  If you are between 44 years and 41 years old, ask your health care provider if you should take aspirin to prevent strokes.  Have regular diabetes screenings. This involves taking a blood sample to check your fasting blood sugar level. ? If you are at a normal weight and have a low risk for diabetes, have this test once every three years after 48 years of age. ? If you are overweight and have a high risk for diabetes, consider being tested at a younger age or more often. Preventing infection Hepatitis B  If you have a higher risk for hepatitis B, you should be screened for this virus. You are considered at high risk for hepatitis B if: ? You were born in a country where hepatitis B is common. Ask your health care provider which countries are considered high risk. ? Your parents were born in a high-risk country, and you have not been immunized against hepatitis B (hepatitis B vaccine). ? You have HIV or AIDS. ? You use needles to inject street drugs. ? You live with  someone who has hepatitis B. ? You have had sex with someone who has hepatitis B. ? You get hemodialysis treatment. ? You take certain medicines for conditions, including cancer, organ transplantation, and autoimmune conditions.  Hepatitis C  Blood testing is recommended for: ? Everyone born from [birth year] through 1966. ? Anyone with known risk factors for hepatitis  C.  Sexually transmitted infections (STIs)  You should be screened for sexually transmitted infections (STIs) including gonorrhea and chlamydia if: ? You are sexually active and are younger than 48 years of age. ? You are older than 48 years of age and your health care provider tells you that you are at risk for this type of infection. ? Your sexual activity has changed since you were last screened and you are at an increased risk for chlamydia or gonorrhea. Ask your health care provider if you are at risk.  If you do not have HIV, but are at risk, it may be recommended that you take a prescription medicine daily to prevent HIV infection. This is called pre-exposure prophylaxis (PrEP). You are considered at risk if: ? You are sexually active and do not regularly use condoms or know the HIV status of your partner(s). ? You take drugs by injection. ? You are sexually active with a partner who has HIV.  Talk with your health care provider about whether you are at high risk of being infected with HIV. If you choose to begin PrEP, you should first be tested for HIV. You should then be tested every 3 months for as long as you are taking PrEP. Pregnancy  If you are premenopausal and you may become pregnant, ask your health care provider about preconception counseling.  If you may become pregnant, take 400 to 800 micrograms (mcg) of folic acid every day.  If you want to prevent pregnancy, talk to your health care provider about birth control (contraception). Osteoporosis and menopause  Osteoporosis is a disease in which the bones lose  minerals and strength with aging. This can result in serious bone fractures. Your risk for osteoporosis can be identified using a bone density scan.  If you are 16 years of age or older, or if you are at risk for osteoporosis and fractures, ask your health care provider if you should be screened.  Ask your health care provider whether you should take a calcium or vitamin D supplement to lower your risk for osteoporosis.  Menopause may have certain physical symptoms and risks.  Hormone replacement therapy may reduce some of these symptoms and risks. Talk to your health care provider about whether hormone replacement therapy is right for you. Follow these instructions at home:  Schedule regular health, dental, and eye exams.  Stay current with your immunizations.  Do not use any tobacco products including cigarettes, chewing tobacco, or electronic cigarettes.  If you are pregnant, do not drink alcohol.  If you are breastfeeding, limit how much and how often you drink alcohol.  Limit alcohol intake to no more than 1 drink per day for nonpregnant women. One drink equals 12 ounces of beer, 5 ounces of wine, or 1 ounces of hard liquor.  Do not use street drugs.  Do not share needles.  Ask your health care provider for help if you need support or information about quitting drugs.  Tell your health care provider if you often feel depressed.  Tell your health care provider if you have ever been abused or do not feel safe at home. This information is not intended to replace advice given to you by your health care provider. Make sure you discuss any questions you have with your health care provider. Document Released: 02/15/2011 Document Revised: 01/08/2016 Document Reviewed: 05/06/2015 Elsevier Interactive Patient Education  2018 [employer].     Will plan   Cardiology for sweating and  Sob  Lab  And then    [employer]. Samari Gorby M.D.

## 2017-12-20 ENCOUNTER — Encounter: Payer: Self-pay | Admitting: Internal Medicine

## 2017-12-20 ENCOUNTER — Ambulatory Visit (INDEPENDENT_AMBULATORY_CARE_PROVIDER_SITE_OTHER): Payer: BLUE CROSS/BLUE SHIELD | Admitting: Internal Medicine

## 2017-12-20 VITALS — BP 118/82 | HR 88 | Temp 98.3°F | Ht 70.67 in | Wt 248.1 lb

## 2017-12-20 DIAGNOSIS — L659 Nonscarring hair loss, unspecified: Secondary | ICD-10-CM

## 2017-12-20 DIAGNOSIS — I10 Essential (primary) hypertension: Secondary | ICD-10-CM

## 2017-12-20 DIAGNOSIS — Z79899 Other long term (current) drug therapy: Secondary | ICD-10-CM

## 2017-12-20 DIAGNOSIS — G4733 Obstructive sleep apnea (adult) (pediatric): Secondary | ICD-10-CM | POA: Diagnosis not present

## 2017-12-20 DIAGNOSIS — L910 Hypertrophic scar: Secondary | ICD-10-CM

## 2017-12-20 DIAGNOSIS — Z0000 Encounter for general adult medical examination without abnormal findings: Secondary | ICD-10-CM | POA: Diagnosis not present

## 2017-12-20 DIAGNOSIS — R0602 Shortness of breath: Secondary | ICD-10-CM

## 2017-12-20 DIAGNOSIS — N951 Menopausal and female climacteric states: Secondary | ICD-10-CM

## 2017-12-20 DIAGNOSIS — Z923 Personal history of irradiation: Secondary | ICD-10-CM | POA: Diagnosis not present

## 2017-12-20 LAB — BASIC METABOLIC PANEL
BUN: 18 mg/dL (ref 6–23)
CHLORIDE: 99 meq/L (ref 96–112)
CO2: 32 mEq/L (ref 19–32)
Calcium: 10.1 mg/dL (ref 8.4–10.5)
Creatinine, Ser: 0.77 mg/dL (ref 0.40–1.20)
GFR: 103.11 mL/min (ref >60.00)
GLUCOSE: 98 mg/dL (ref 70–99)
POTASSIUM: 3.6 meq/L (ref 3.5–5.1)
SODIUM: 139 meq/L (ref 135–145)

## 2017-12-20 LAB — CBC WITH DIFFERENTIAL/PLATELET
Basophils Absolute: 0 10*3/uL (ref 0.0–0.1)
Basophils Relative: 0.4 % (ref 0.0–3.0)
EOS PCT: 1.2 % (ref 0.0–5.0)
Eosinophils Absolute: 0.1 10*3/uL (ref 0.0–0.7)
HEMATOCRIT: 42.8 % (ref 36.0–46.0)
Hemoglobin: 14.6 g/dL (ref 12.0–15.0)
LYMPHS ABS: 3 10*3/uL (ref 0.7–4.0)
LYMPHS PCT: 44.4 % (ref 12.0–46.0)
MCHC: 34.2 g/dL (ref 30.0–36.0)
MCV: 89.8 fl (ref 78.0–100.0)
MONOS PCT: 7 % (ref 3.0–12.0)
Monocytes Absolute: 0.5 10*3/uL (ref 0.1–1.0)
NEUTROS ABS: 3.2 10*3/uL (ref 1.4–7.7)
NEUTROS PCT: 47 % (ref 43.0–77.0)
PLATELETS: 288 10*3/uL (ref 150.0–400.0)
RBC: 4.77 Mil/uL (ref 3.87–5.11)
RDW: 13.6 % (ref 11.5–15.5)
WBC: 6.7 10*3/uL (ref 4.0–10.5)

## 2017-12-20 LAB — HEPATIC FUNCTION PANEL
ALBUMIN: 4.6 g/dL (ref 3.5–5.2)
ALT: 24 U/L (ref 0–35)
AST: 18 U/L (ref 0–37)
Alkaline Phosphatase: 95 U/L (ref 39–117)
Bilirubin, Direct: 0.2 mg/dL (ref 0.0–0.3)
TOTAL PROTEIN: 7.7 g/dL (ref 6.0–8.3)
Total Bilirubin: 0.9 mg/dL (ref 0.2–1.2)

## 2017-12-20 LAB — LIPID PANEL
CHOL/HDL RATIO: 6
Cholesterol: 228 mg/dL — ABNORMAL HIGH (ref 0–200)
HDL: 40.1 mg/dL (ref >39.00)
LDL Cholesterol: 154 mg/dL — ABNORMAL HIGH (ref 0–99)
NONHDL: 187.76
TRIGLYCERIDES: 170 mg/dL — AB (ref 0.0–149.0)
VLDL: 34 mg/dL (ref 0.0–40.0)

## 2017-12-20 LAB — HEMOGLOBIN A1C: Hgb A1c MFr Bld: 6.5 % (ref 4.6–6.5)

## 2017-12-20 LAB — FOLLICLE STIMULATING HORMONE: FSH: 50.8 m[IU]/mL

## 2017-12-20 LAB — TSH: TSH: 1.27 u[IU]/mL (ref 0.35–4.50)

## 2017-12-20 LAB — TESTOSTERONE: TESTOSTERONE: 54.19 ng/dL — AB (ref 15.00–40.00)

## 2017-12-20 LAB — T4, FREE: FREE T4: 0.91 ng/dL (ref 0.60–1.60)

## 2017-12-20 NOTE — Patient Instructions (Addendum)
Will notify you  of labs when available. Will be contacted by cardiology about advisability of further eval  For the  Sweating and  Dyspnea with activity .   possible stress testing   Checking other labs  And depending on  results may have you see dermatology about the   Balding on top of head .    Or medication that could be a cause . Fu after all evaluation  As to further   Help.     Health Maintenance, Female Adopting a healthy lifestyle and getting preventive care can go a long way to promote health and wellness. Talk with your health care provider about what schedule of regular examinations is right for you. This is a good chance for you to check in with your provider about disease prevention and staying healthy. In between checkups, there are plenty of things you can do on your own. Experts have done a lot of research about which lifestyle changes and preventive measures are most likely to keep you healthy. Ask your health care provider for more information. Weight and diet Eat a healthy diet  Be sure to include plenty of vegetables, fruits, low-fat dairy products, and lean protein.  Do not eat a lot of foods high in solid fats, added sugars, or salt.  Get regular exercise. This is one of the most important things you can do for your health. ? Most adults should exercise for at least 150 minutes each week. The exercise should increase your heart rate and make you sweat (moderate-intensity exercise). ? Most adults should also do strengthening exercises at least twice a week. This is in addition to the moderate-intensity exercise.  Maintain a healthy weight  Body mass index (BMI) is a measurement that can be used to identify possible weight problems. It estimates body fat based on height and weight. Your health care provider can help determine your BMI and help you achieve or maintain a healthy weight.  For females 48 years of age and older: ? A BMI below 18.5 is considered  underweight. ? A BMI of 18.5 to 24.9 is normal. ? A BMI of 25 to 29.9 is considered overweight. ? A BMI of 30 and above is considered obese.  Watch levels of cholesterol and blood lipids  You should start having your blood tested for lipids and cholesterol at 48 years of age, then have this test every 5 years.  You may need to have your cholesterol levels checked more often if: ? Your lipid or cholesterol levels are high. ? You are older than 48 years of age. ? You are at high risk for heart disease.  Cancer screening Lung Cancer  Lung cancer screening is recommended for adults 48-26 years old who are at high risk for lung cancer because of a history of smoking.  A yearly low-dose CT scan of the lungs is recommended for people who: ? Currently smoke. ? Have quit within the past 15 years. ? Have at least a 30-pack-year history of smoking. A pack year is smoking an average of one pack of cigarettes a day for 1 year.  Yearly screening should continue until it has been 15 years since you quit.  Yearly screening should stop if you develop a health problem that would prevent you from having lung cancer treatment.  Breast Cancer  Practice breast self-awareness. This means understanding how your breasts normally appear and feel.  It also means doing regular breast self-exams. Let your health care provider know  about any changes, no matter how small.  If you are in your 48s or 30s, you should have a clinical breast exam (CBE) by a health care provider every 1-3 years as part of a regular health exam.  If you are 48 or older, have a CBE every year. Also consider having a breast X-ray (mammogram) every year.  If you have a family history of breast cancer, talk to your health care provider about genetic screening.  If you are at high risk for breast cancer, talk to your health care provider about having an MRI and a mammogram every year.  Breast cancer gene (BRCA) assessment is  recommended for women who have family members with BRCA-related cancers. BRCA-related cancers include: ? Breast. ? Ovarian. ? Tubal. ? Peritoneal cancers.  Results of the assessment will determine the need for genetic counseling and BRCA1 and BRCA2 testing.  Cervical Cancer Your health care provider may recommend that you be screened regularly for cancer of the pelvic organs (ovaries, uterus, and vagina). This screening involves a pelvic examination, including checking for microscopic changes to the surface of your cervix (Pap test). You may be encouraged to have this screening done every 3 years, beginning at age 48.  For women ages 9-65, health care providers may recommend pelvic exams and Pap testing every 3 years, or they may recommend the Pap and pelvic exam, combined with testing for human papilloma virus (HPV), every 5 years. Some types of HPV increase your risk of cervical cancer. Testing for HPV may also be done on women of any age with unclear Pap test results.  Other health care providers may not recommend any screening for nonpregnant women who are considered low risk for pelvic cancer and who do not have symptoms. Ask your health care provider if a screening pelvic exam is right for you.  If you have had past treatment for cervical cancer or a condition that could lead to cancer, you need Pap tests and screening for cancer for at least 20 years after your treatment. If Pap tests have been discontinued, your risk factors (such as having a new sexual partner) need to be reassessed to determine if screening should resume. Some women have medical problems that increase the chance of getting cervical cancer. In these cases, your health care provider may recommend more frequent screening and Pap tests.  Colorectal Cancer  This type of cancer can be detected and often prevented.  Routine colorectal cancer screening usually begins at 48 years of age and continues through 48 years of  age.  Your health care provider may recommend screening at an earlier age if you have risk factors for colon cancer.  Your health care provider may also recommend using home test kits to check for hidden blood in the stool.  A small camera at the end of a tube can be used to examine your colon directly (sigmoidoscopy or colonoscopy). This is done to check for the earliest forms of colorectal cancer.  Routine screening usually begins at age 50.  Direct examination of the colon should be repeated every 5-10 years through 48 years of age. However, you may need to be screened more often if early forms of precancerous polyps or small growths are found.  Skin Cancer  Check your skin from head to toe regularly.  Tell your health care provider about any new moles or changes in moles, especially if there is a change in a mole's shape or color.  Also tell your health care  provider if you have a mole that is larger than the size of a pencil eraser.  Always use sunscreen. Apply sunscreen liberally and repeatedly throughout the day.  Protect yourself by wearing long sleeves, pants, a wide-brimmed hat, and sunglasses whenever you are outside.  Heart disease, diabetes, and high blood pressure  High blood pressure causes heart disease and increases the risk of stroke. High blood pressure is more likely to develop in: ? People who have blood pressure in the high end of the normal range (130-139/85-89 mm Hg). ? People who are overweight or obese. ? People who are African American.  If you are 58-81 years of age, have your blood pressure checked every 3-5 years. If you are 62 years of age or older, have your blood pressure checked every year. You should have your blood pressure measured twice-once when you are at a hospital or clinic, and once when you are not at a hospital or clinic. Record the average of the two measurements. To check your blood pressure when you are not at a hospital or clinic, you  can use: ? An automated blood pressure machine at a pharmacy. ? A home blood pressure monitor.  If you are between 61 years and 38 years old, ask your health care provider if you should take aspirin to prevent strokes.  Have regular diabetes screenings. This involves taking a blood sample to check your fasting blood sugar level. ? If you are at a normal weight and have a low risk for diabetes, have this test once every three years after 48 years of age. ? If you are overweight and have a high risk for diabetes, consider being tested at a younger age or more often. Preventing infection Hepatitis B  If you have a higher risk for hepatitis B, you should be screened for this virus. You are considered at high risk for hepatitis B if: ? You were born in a country where hepatitis B is common. Ask your health care provider which countries are considered high risk. ? Your parents were born in a high-risk country, and you have not been immunized against hepatitis B (hepatitis B vaccine). ? You have HIV or AIDS. ? You use needles to inject street drugs. ? You live with someone who has hepatitis B. ? You have had sex with someone who has hepatitis B. ? You get hemodialysis treatment. ? You take certain medicines for conditions, including cancer, organ transplantation, and autoimmune conditions.  Hepatitis C  Blood testing is recommended for: ? Everyone born from [birth year] through 1966. ? Anyone with known risk factors for hepatitis C.  Sexually transmitted infections (STIs)  You should be screened for sexually transmitted infections (STIs) including gonorrhea and chlamydia if: ? You are sexually active and are younger than 48 years of age. ? You are older than 48 years of age and your health care provider tells you that you are at risk for this type of infection. ? Your sexual activity has changed since you were last screened and you are at an increased risk for chlamydia or gonorrhea. Ask your  health care provider if you are at risk.  If you do not have HIV, but are at risk, it may be recommended that you take a prescription medicine daily to prevent HIV infection. This is called pre-exposure prophylaxis (PrEP). You are considered at risk if: ? You are sexually active and do not regularly use condoms or know the HIV status of your partner(s). ? You take  drugs by injection. ? You are sexually active with a partner who has HIV.  Talk with your health care provider about whether you are at high risk of being infected with HIV. If you choose to begin PrEP, you should first be tested for HIV. You should then be tested every 3 months for as long as you are taking PrEP. Pregnancy  If you are premenopausal and you may become pregnant, ask your health care provider about preconception counseling.  If you may become pregnant, take 400 to 800 micrograms (mcg) of folic acid every day.  If you want to prevent pregnancy, talk to your health care provider about birth control (contraception). Osteoporosis and menopause  Osteoporosis is a disease in which the bones lose minerals and strength with aging. This can result in serious bone fractures. Your risk for osteoporosis can be identified using a bone density scan.  If you are 54 years of age or older, or if you are at risk for osteoporosis and fractures, ask your health care provider if you should be screened.  Ask your health care provider whether you should take a calcium or vitamin D supplement to lower your risk for osteoporosis.  Menopause may have certain physical symptoms and risks.  Hormone replacement therapy may reduce some of these symptoms and risks. Talk to your health care provider about whether hormone replacement therapy is right for you. Follow these instructions at home:  Schedule regular health, dental, and eye exams.  Stay current with your immunizations.  Do not use any tobacco products including cigarettes, chewing  tobacco, or electronic cigarettes.  If you are pregnant, do not drink alcohol.  If you are breastfeeding, limit how much and how often you drink alcohol.  Limit alcohol intake to no more than 1 drink per day for nonpregnant women. One drink equals 12 ounces of beer, 5 ounces of wine, or 1 ounces of hard liquor.  Do not use street drugs.  Do not share needles.  Ask your health care provider for help if you need support or information about quitting drugs.  Tell your health care provider if you often feel depressed.  Tell your health care provider if you have ever been abused or do not feel safe at home. This information is not intended to replace advice given to you by your health care provider. Make sure you discuss any questions you have with your health care provider. Document Released: 02/15/2011 Document Revised: 01/08/2016 Document Reviewed: 05/06/2015 Elsevier Interactive Patient Education  [employer].

## 2017-12-21 LAB — DHEA-SULFATE: DHEA SO4: 263 ug/dL — AB (ref 19–231)

## 2017-12-29 ENCOUNTER — Other Ambulatory Visit: Payer: Self-pay | Admitting: Internal Medicine

## 2018-02-22 ENCOUNTER — Encounter (HOSPITAL_COMMUNITY): Payer: Self-pay | Admitting: Emergency Medicine

## 2018-02-22 ENCOUNTER — Other Ambulatory Visit: Payer: Self-pay | Admitting: Internal Medicine

## 2018-02-22 ENCOUNTER — Other Ambulatory Visit: Payer: Self-pay

## 2018-02-22 ENCOUNTER — Ambulatory Visit (HOSPITAL_COMMUNITY)
Admission: EM | Admit: 2018-02-22 | Discharge: 2018-02-22 | Disposition: A | Discharge status: Home / Self Care | Payer: BLUE CROSS/BLUE SHIELD | Attending: Family Medicine | Admitting: Family Medicine

## 2018-02-22 DIAGNOSIS — Z3202 Encounter for pregnancy test, result negative: Secondary | ICD-10-CM | POA: Diagnosis not present

## 2018-02-22 DIAGNOSIS — R109 Unspecified abdominal pain: Secondary | ICD-10-CM | POA: Diagnosis not present

## 2018-02-22 LAB — POCT URINALYSIS DIP (DEVICE)
BILIRUBIN URINE: NEGATIVE
GLUCOSE, UA: NEGATIVE mg/dL
KETONES UR: NEGATIVE mg/dL
LEUKOCYTES UA: NEGATIVE
NITRITE: NEGATIVE
Protein, ur: NEGATIVE mg/dL
Specific Gravity, Urine: >=1.03 (ref 1.005–1.030)
Urobilinogen, UA: 0.2 mg/dL (ref 0.0–1.0)
pH: 5.5 (ref 5.0–8.0)

## 2018-02-22 LAB — POCT PREGNANCY, URINE: PREG TEST UR: NEGATIVE

## 2018-02-22 MED ORDER — DICLOFENAC SODIUM 75 MG PO TBEC: 75.0000 mg | DELAYED_RELEASE_TABLET | Freq: Two times a day (BID) | ORAL | 0 refills | Status: DC

## 2018-02-22 MED ORDER — CYCLOBENZAPRINE HCL 10 MG PO TABS: 10.0000 mg | ORAL_TABLET | Freq: Three times a day (TID) | ORAL | 0 refills | Status: DC

## 2018-02-22 NOTE — ED Triage Notes (Signed)
Right flank pain noticed yesterday.  Patient feels bad today.  No burning with urination

## 2018-02-22 NOTE — ED Notes (Signed)
Pt discharged by provider.

## 2018-03-08 NOTE — ED Provider Notes (Signed)
[facility]   [id number] 02/22/18 Arrival Time: 1843  ASSESSMENT & PLAN:  1. Flank pain     Meds ordered this encounter  Medications  . diclofenac (VOLTAREN) 75 MG EC tablet    Sig: Take 1 tablet (75 mg total) by mouth 2 (two) times daily.    Dispense:  14 tablet    Refill:  0  . cyclobenzaprine (FLEXERIL) 10 MG tablet    Sig: Take 1 tablet (10 mg total) by mouth 3 (three) times daily.    Dispense:  20 tablet    Refill:  0   Discussed possibility of kidney stone. Observe.  Follow-up Information    [facility].   Specialty:  Emergency Medicine Why:  If symptoms worsen. Contact information: [address] [id number] [place] [address] [phone]         Ensure adequate fluid intake. ROM as tolerated. Reviewed expectations re: course of current medical issues. Questions answered. Outlined signs and symptoms indicating need for more acute intervention. Patient verbalized understanding. After Visit Summary given.   SUBJECTIVE: History from: patient.  [name] is a 47 y.o. female who presents with complaint of intermittent right sided flank/back discomfort. Onset gradual beginning yesterday. Injury/trama: no. History of back problems: no. Previous back surgery: no. Discomfort described as aching without radiation. Certain movements exacerbate the described discomfort. Better with rest. Extremity sensation changes or weakness: none. Ambulatory without difficulty. Normal bowel/bladder habits. No gross hematuria reported. No associated abdominal pain/n/v. Self treatment: has not tried OTCs for relief of pain.  Patient reports no fevers, IV drug use, recent back surgeries or procedures, urinary incontinence, or bowel incontinence.  ROS: As per HPI.   OBJECTIVE:  Vitals:   02/22/18 1935  BP: 120/79  Pulse: 97  Resp: 18  Temp: 98.3 F (36.8 C)  TempSrc: Oral  SpO2: 99%    General  appearance: alert; no distress Neck: supple with FROM; without midline tenderness Lungs: unlabored respirations; symmetrical air entry Abdomen: soft, non-tender; bowel sounds normal; no masses or organomegaly; no guarding or rebound tenderness Back: right sided lower tenderness present over lumbar musculature and flank; FROM at hips with mild discomfort reported; bruising: none; without midline tenderness Extremities: no cyanosis or edema; symmetrical with no gross deformities Skin: warm and dry Neurologic: normal gait; normal symmetric reflexes; normal LE strength and sensation Psychological: alert and cooperative; normal mood and affect  Labs: Results for orders placed or performed during the hospital encounter of 02/22/18  POCT urinalysis dip (device)  Result Value Ref Range   Glucose, UA NEGATIVE NEGATIVE mg/dL   Bilirubin Urine NEGATIVE NEGATIVE   Ketones, ur NEGATIVE NEGATIVE mg/dL   Specific Gravity, Urine >=1.030 1.005 - 1.030   Hgb urine dipstick SMALL (A) NEGATIVE   pH 5.5 5.0 - 8.0   Protein, ur NEGATIVE NEGATIVE mg/dL   Urobilinogen, UA 0.2 0.0 - 1.0 mg/dL   Nitrite NEGATIVE NEGATIVE   Leukocytes, UA NEGATIVE NEGATIVE  Pregnancy, urine POC  Result Value Ref Range   Preg Test, Ur NEGATIVE NEGATIVE   Labs Reviewed  POCT URINALYSIS DIP (DEVICE) - Abnormal; Notable for the following components:      Result Value   Hgb urine dipstick SMALL (*)    All other components within normal limits  POCT PREGNANCY, URINE   Allergies  Allergen Reactions  . Tylox [Oxycodone-Acetaminophen] Swelling    Caused face to swell    Past Medical History:  Diagnosis Date  .  Hypertension   . Hypoglycemia   . Radiation 10/21/2015-10/23/2015   Anterior neck area 12 gray in 3 fractions  . Teratoma    Social History   Socioeconomic History  . Marital status: Married    Spouse name: Not on file  . Number of children: 2  . Years of education: Not on file  . Highest education level: Not  on file  Occupational History  . Occupation: Therapist, music  Social Needs  . Financial resource strain: Not on file  . Food insecurity:    Worry: Not on file    Inability: Not on file  . Transportation needs:    Medical: Not on file    Non-medical: Not on file  Tobacco Use  . Smoking status: Never Smoker  . Smokeless tobacco: Never Used  Substance and Sexual Activity  . Alcohol use: Yes    Comment: socially  . Drug use: No  . Sexual activity: Yes    Birth control/protection: Surgical  Lifestyle  . Physical activity:    Days per week: Not on file    Minutes per session: Not on file  . Stress: Not on file  Relationships  . Social connections:    Talks on phone: Not on file    Gets together: Not on file    Attends religious service: Not on file    Active member of club or organization: Not on file    Attends meetings of clubs or organizations: Not on file    Relationship status: Not on file  . Intimate partner violence:    Fear of current or ex partner: Not on file    Emotionally abused: Not on file    Physically abused: Not on file    Forced sexual activity: Not on file  Other Topics Concern  . Not on file  Social History Narrative  . Not on file   Family History  Problem Relation Age of Onset  . Hypertension Other    Past Surgical History:  Procedure Laterality Date  . ABDOMINAL HERNIA REPAIR    . ABDOMINAL HYSTERECTOMY    . CESAREAN SECTION    . CRYOTHERAPY  2013   to neck  . DERMOID CYST  EXCISION    . ECTOPIC PREGNANCY SURGERY    . EXCISION MASS NECK Bilateral 10/20/2015   Procedure: EXCISION OF LARGE KELOIDS SEVERE RIGHT AND LEFT NECK WITH PLASTIC RECONSTRUCTION;  Surgeon: Cristine Polio, MD;  Location: [facility];  Service: Plastics;  Laterality: Bilateral;  . HAND SURGERY     amputation of right index finger     Vanessa Kick, MD 03/08/18 [phone]

## 2018-04-21 ENCOUNTER — Telehealth: Payer: Self-pay | Admitting: Internal Medicine

## 2018-04-21 MED ORDER — OLMESARTAN-AMLODIPINE-HCTZ 40-5-25 MG PO TABS: 1.0000 | ORAL_TABLET | Freq: Every day | ORAL | 2 refills | Status: DC

## 2018-04-21 NOTE — Telephone Encounter (Signed)
Omesartan-Amlodipine-HCTZ refill Last Refill: 02/23/18 # 30; RF x 1 Last OV: 12/20/17 PCP: Dr. Regis Bill Pharmacy: [facility] on [address] and [address]  Above medication refilled per protocol

## 2018-04-21 NOTE — Telephone Encounter (Signed)
Copied from [place] [id number]. Topic: Quick Communication - Rx Refill/Question >> Apr 21, 2018  2:43 PM Wynetta Emery, [place] C wrote: Medication: Olmesartan-amLODIPine-HCTZ 40-5-25 MG TABS -- pt says that she is completely out of her medication   Has the patient contacted their pharmacy? No  (Agent: If no, request that the patient contact the pharmacy for the refill.) (Agent: If yes, when and what did the pharmacy advise?)  Preferred Pharmacy (with phone number or street name): [facility], [place] [facility] [address] [facility] [address] [place] [address]: Please be advised that RX refills may take up to 3 business days. We ask that you follow-up with your pharmacy.

## 2018-05-03 ENCOUNTER — Ambulatory Visit (HOSPITAL_COMMUNITY)
Admission: EM | Admit: 2018-05-03 | Discharge: 2018-05-03 | Disposition: A | Discharge status: Home / Self Care | Payer: BLUE CROSS/BLUE SHIELD | Attending: Family Medicine | Admitting: Family Medicine

## 2018-05-03 ENCOUNTER — Other Ambulatory Visit: Payer: Self-pay

## 2018-05-03 ENCOUNTER — Encounter (HOSPITAL_COMMUNITY): Payer: Self-pay | Admitting: Emergency Medicine

## 2018-05-03 DIAGNOSIS — H9202 Otalgia, left ear: Secondary | ICD-10-CM

## 2018-05-03 DIAGNOSIS — J3489 Other specified disorders of nose and nasal sinuses: Secondary | ICD-10-CM | POA: Diagnosis not present

## 2018-05-03 MED ORDER — MELOXICAM 7.5 MG PO TABS: 7.5000 mg | ORAL_TABLET | Freq: Every day | ORAL | 0 refills | Status: DC

## 2018-05-03 MED ORDER — PREDNISONE 50 MG PO TABS: 50.0000 mg | ORAL_TABLET | Freq: Every day | ORAL | 0 refills | Status: DC

## 2018-05-03 MED ORDER — KETOROLAC TROMETHAMINE 30 MG/ML IJ SOLN
INTRAMUSCULAR | Status: AC
  Filled 2018-05-03: qty 1

## 2018-05-03 MED ORDER — KETOROLAC TROMETHAMINE 30 MG/ML IJ SOLN
30.0000 mg | Freq: Once | INTRAMUSCULAR | Status: AC
  Administered 2018-05-03: 30 mg via INTRAMUSCULAR

## 2018-05-03 MED ORDER — FLUTICASONE PROPIONATE 50 MCG/ACT NA SUSP: 2.0000 | Freq: Every day | NASAL | 0 refills | Status: DC

## 2018-05-03 NOTE — ED Provider Notes (Signed)
[place]    CSN: [id number] Arrival date & time: 05/03/18  [place]     History   Chief Complaint Chief Complaint  Patient presents with  . Otalgia    left    HPI [name] is a 47 y.o. female.   47 year old female comes in with 2 to 3-week history of left ear pain.  States started while she was driving, had a painful pop in the ear.  At that time, went to an urgent care and was not able to tolerate the full ear exam, and was given amoxicillin for treatment.  She was given an injection of Toradol with good relief.  States symptoms have slowly worsened, and now with some cold chills, rhinorrhea, nasal congestion.  Has some left facial pain, and has noticed some pain to the upper jaw.  Denies fever, chills, night sweats.  Denies decrease in hearing.  Has not taken anything else for the symptoms.     Past Medical History:  Diagnosis Date  . Hypertension   . Hypoglycemia   . Radiation 10/21/2015-10/23/2015   Anterior neck area 12 gray in 3 fractions  . Teratoma     Patient Active Problem List   Diagnosis Date Noted  . Notalgia 08/10/2017  . Morbid obesity ([place]) 02/18/2017  . OSA (obstructive sleep apnea) 01/24/2017  . 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 Snoring 10/22/2016  . Nocturnal dyspnea/choking   10/22/2016  . Keloid of skin 09/25/2015  . Essential hypertension, benign 09/27/2014  . Teratoma 08/07/2014  . Severe hypertension 06/08/2013  . HYPERLIPIDEMIA, MILD, WITH LOW HDL 07/23/2009  . SHORTNESS OF BREATH 02/11/2009  . HYPERTENSION 05/07/2008  . KELOID SCAR 05/07/2008  . HEADACHE 05/22/2007    Past Surgical History:  Procedure Laterality Date  . ABDOMINAL HERNIA REPAIR    . ABDOMINAL HYSTERECTOMY    . CESAREAN SECTION    . CRYOTHERAPY  2013   to neck  . DERMOID CYST  EXCISION    . ECTOPIC PREGNANCY SURGERY    . EXCISION MASS NECK Bilateral 10/20/2015   Procedure: EXCISION OF LARGE KELOIDS SEVERE RIGHT AND LEFT NECK WITH  PLASTIC RECONSTRUCTION;  Surgeon: Cristine Polio, MD;  Location: [facility];  Service: Plastics;  Laterality: Bilateral;  . HAND SURGERY     amputation of right index finger    OB History   None      Home Medications    Prior to Admission medications   Medication Sig Start Date End Date Taking? Authorizing Provider  Olmesartan-amLODIPine-HCTZ 40-5-25 MG TABS Take 1 tablet by mouth daily. 04/21/18  Yes Panosh, Standley Brooking, MD  fluticasone (FLONASE) 50 MCG/ACT nasal spray Place 2 sprays into both nostrils daily. 05/03/18   Tasia Catchings, Mildred Tuccillo V, PA-C  meloxicam (MOBIC) 7.5 MG tablet Take 1 tablet (7.5 mg total) by mouth daily. 05/03/18   Tasia Catchings, Yuki Purves V, PA-C  predniSONE (DELTASONE) 50 MG tablet Take 1 tablet (50 mg total) by mouth daily. 05/03/18   Ok Edwards, PA-C    Family History Family History  Problem Relation Age of Onset  . Hypertension Other     Social History Social History   Tobacco Use  . Smoking status: Never Smoker  . Smokeless tobacco: Never Used  Substance Use Topics  . Alcohol use: Yes    Comment: socially  . Drug use: No     Allergies   Tylox [oxycodone-acetaminophen]   Review of Systems Review of Systems  Reason unable to perform  ROS: See HPI as above.     Physical Exam Triage Vital Signs ED Triage Vitals  Enc Vitals Group     BP 05/03/18 1928 (!) 157/97     Pulse Rate 05/03/18 1928 84     Resp --      Temp 05/03/18 1928 98.3 F (36.8 C)     Temp Source 05/03/18 1928 Oral     SpO2 05/03/18 1928 100 %     Weight --      Height --      Head Circumference --      Peak Flow --      Pain Score 05/03/18 1929 4     Pain Loc --      Pain Edu? --      Excl. in [place]? --    No data found.  Updated Vital Signs BP (!) 157/97 (BP Location: Left Arm)   Pulse 84   Temp 98.3 F (36.8 C) (Oral)   SpO2 100%   Physical Exam  Constitutional: She is oriented to person, place, and time. She appears well-developed and well-nourished. No distress.  HENT:    Head: Normocephalic and atraumatic.  Right Ear: Tympanic membrane, external ear and ear canal normal. No mastoid tenderness. Tympanic membrane is not erythematous and not bulging.  Left Ear: Tympanic membrane, external ear and ear canal normal. No mastoid tenderness. Tympanic membrane is not erythematous and not bulging.  Nose: Right sinus exhibits no maxillary sinus tenderness and no frontal sinus tenderness. Left sinus exhibits maxillary sinus tenderness and frontal sinus tenderness.  Mouth/Throat: Uvula is midline, oropharynx is clear and moist and mucous membranes are normal. No tonsillar exudate.  Overall poor dentition.  Mild tenderness to palpation of left upper gums without swelling, fluctuance.  No facial swelling.  Floor of mouth soft to palpation without tenderness.  Eyes: Pupils are equal, round, and reactive to light. Conjunctivae are normal.  Neck: Normal range of motion. Neck supple.  Cardiovascular: Normal rate, regular rhythm and normal heart sounds. Exam reveals no gallop and no friction rub.  No murmur heard. Pulmonary/Chest: Effort normal and breath sounds normal. She has no decreased breath sounds. She has no wheezes. She has no rhonchi. She has no rales.  Lymphadenopathy:    She has no cervical adenopathy.  Neurological: She is alert and oriented to person, place, and time.  Skin: Skin is warm and dry.  Psychiatric: She has a normal mood and affect. Her behavior is normal. Judgment normal.     UC Treatments / Results  Labs (all labs ordered are listed, but only abnormal results are displayed) Labs Reviewed - No data to display  EKG None  Radiology No results found.  Procedures Procedures (including critical care time)  Medications Ordered in UC Medications  ketorolac (TORADOL) 30 MG/ML injection 30 mg (30 mg Intramuscular Given 05/03/18 2009)    Initial Impression / Assessment and Plan / UC Course  I have reviewed the triage vital signs and the nursing  notes.  Pertinent labs & imaging results that were available during my care of the patient were reviewed by me and considered in my medical decision making (see chart for details).    Discussed possible eustachian tube dysfunction causing symptoms.  Toradol injection in office today.  Will start prednisone as directed, Flonase as directed.  Mobic for pain.  Patient to follow-up with ENT for further evaluation if symptoms not improving.  Final Clinical Impressions(s) / UC Diagnoses   Final diagnoses:  Otalgia of left ear  Sinus pressure    ED Prescriptions    Medication Sig Dispense Auth. Provider   predniSONE (DELTASONE) 50 MG tablet Take 1 tablet (50 mg total) by mouth daily. 5 tablet [place] Urieta V, PA-C   fluticasone (FLONASE) 50 MCG/ACT nasal spray Place 2 sprays into both nostrils daily. 1 g Lauretta Sallas V, PA-C   meloxicam (MOBIC) 7.5 MG tablet Take 1 tablet (7.5 mg total) by mouth daily. 15 tablet Tobin Chad, [place] 05/03/18 2046

## 2018-05-03 NOTE — ED Triage Notes (Signed)
Pt reports left ear pain x2 weeks.  Pt was seen in another UC for this and was given a Tramadol injection for pain and an antibiotic.    Pt states she is still having pain.

## 2018-05-03 NOTE — Discharge Instructions (Signed)
Toradol injection in office today. Prednisone as directed. Flonase as directed. Mobic for pain as needed. Do not take ibuprofen (motrin/advil)/ naproxen (aleve) while on mobic. You can also start allergy medicine for symptoms as well. Follow up with ear nose and throat if symptoms not improving.

## 2018-05-10 ENCOUNTER — Telehealth: Payer: BLUE CROSS/BLUE SHIELD | Admitting: Family

## 2018-05-10 DIAGNOSIS — H1030 Unspecified acute conjunctivitis, unspecified eye: Secondary | ICD-10-CM

## 2018-05-10 MED ORDER — POLYMYXIN B-TRIMETHOPRIM 10000-0.1 UNIT/ML-% OP SOLN: 1.0000 [drp] | OPHTHALMIC | 0 refills | Status: DC

## 2018-05-10 NOTE — Progress Notes (Signed)

## 2018-05-28 ENCOUNTER — Encounter (HOSPITAL_COMMUNITY): Payer: Self-pay | Admitting: Family Medicine

## 2018-05-28 ENCOUNTER — Ambulatory Visit (HOSPITAL_COMMUNITY)
Admission: EM | Admit: 2018-05-28 | Discharge: 2018-05-28 | Disposition: A | Discharge status: Home / Self Care | Payer: BLUE CROSS/BLUE SHIELD | Attending: Family Medicine | Admitting: Family Medicine

## 2018-05-28 DIAGNOSIS — K089 Disorder of teeth and supporting structures, unspecified: Secondary | ICD-10-CM

## 2018-05-28 DIAGNOSIS — G8929 Other chronic pain: Secondary | ICD-10-CM

## 2018-05-28 MED ORDER — TRAMADOL HCL 50 MG PO TABS: 50.0000 mg | ORAL_TABLET | Freq: Four times a day (QID) | ORAL | 0 refills | Status: DC | PRN

## 2018-05-28 MED ORDER — KETOROLAC TROMETHAMINE 60 MG/2ML IM SOLN
60.0000 mg | Freq: Once | INTRAMUSCULAR | Status: AC
Start: 2018-05-28 — End: 2018-05-28
  Administered 2018-05-28: 60 mg via INTRAMUSCULAR

## 2018-05-28 MED ORDER — KETOROLAC TROMETHAMINE 60 MG/2ML IM SOLN
INTRAMUSCULAR | Status: AC
  Filled 2018-05-28: qty 2

## 2018-05-28 NOTE — ED Triage Notes (Signed)
Pt c/o ongoing ear pain x2 weeks, been seen here before for it. Pt has appt with ENT this week. Pt also c/o dental pain

## 2018-05-28 NOTE — ED Provider Notes (Addendum)
[place]    CSN: [id number] Arrival date & time: 05/28/18  1608     History   Chief Complaint Chief Complaint  Patient presents with  . Otalgia  . Dental Pain    HPI [name] is a 47 y.o. female.   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  Yesterday she saw a dentist to diagnosed her with a dental abscess and tooth #17.  She was started on amoxicillin.  She complains about continuing pain and would like something to help her deal with that.     Past Medical History:  Diagnosis Date  . Hypertension   . Hypoglycemia   . Radiation 10/21/2015-10/23/2015   Anterior neck area 12 gray in 3 fractions  . Teratoma     Patient Active Problem List   Diagnosis Date Noted  . Notalgia 08/10/2017  . Morbid obesity ([place]) 02/18/2017  . OSA (obstructive sleep apnea) 01/24/2017  . 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 Snoring 10/22/2016  . Nocturnal dyspnea/choking   10/22/2016  . Keloid of skin 09/25/2015  . Essential hypertension, benign 09/27/2014  . Teratoma 08/07/2014  . Severe hypertension 06/08/2013  . HYPERLIPIDEMIA, MILD, WITH LOW HDL 07/23/2009  . SHORTNESS OF BREATH 02/11/2009  . HYPERTENSION 05/07/2008  . KELOID SCAR 05/07/2008  . HEADACHE 05/22/2007    Past Surgical History:  Procedure Laterality Date  . ABDOMINAL HERNIA REPAIR    . ABDOMINAL HYSTERECTOMY    . CESAREAN SECTION    . CRYOTHERAPY  2013   to neck  . DERMOID CYST  EXCISION    . ECTOPIC PREGNANCY SURGERY    . EXCISION MASS NECK Bilateral 10/20/2015   Procedure: EXCISION OF LARGE KELOIDS SEVERE RIGHT AND LEFT NECK WITH PLASTIC RECONSTRUCTION;  Surgeon: Cristine Polio, MD;  Location: [facility];  Service: Plastics;  Laterality: Bilateral;  . HAND SURGERY     amputation of right index finger    OB History   None      Home Medications    Prior to Admission medications   Medication  Sig Start Date End Date Taking? Authorizing Provider  amoxicillin (AMOXIL) 500 MG capsule Take 500 mg by mouth 3 (three) times daily.   Yes [provider]  fluticasone (FLONASE) 50 MCG/ACT nasal spray Place 2 sprays into both nostrils daily. 05/03/18   Tasia Catchings, Amy V, PA-C  Olmesartan-amLODIPine-HCTZ 40-5-25 MG TABS Take 1 tablet by mouth daily. 04/21/18   Panosh, Standley Brooking, MD  traMADol (ULTRAM) 50 MG tablet Take 1 tablet (50 mg total) by mouth every 6 (six) hours as needed. 05/28/18   Robyn Haber, MD  trimethoprim-polymyxin b (POLYTRIM) ophthalmic solution Place 1 drop into the right eye every 4 (four) hours. 05/10/18   Sharion Balloon, FNP    Family History Family History  Problem Relation Age of Onset  . Hypertension Other     Social History Social History   Tobacco Use  . Smoking status: Never Smoker  . Smokeless tobacco: Never Used  Substance Use Topics  . Alcohol use: Yes    Comment: socially  . Drug use: No     Allergies   Tylox [oxycodone-acetaminophen]   Review of Systems Review of Systems   Physical Exam Triage Vital Signs ED Triage Vitals  Enc Vitals Group     BP      Pulse      Resp  Temp      Temp src      SpO2      Weight      Height      Head Circumference      Peak Flow      Pain Score      Pain Loc      Pain Edu?      Excl. in [place]?    No data found.  Updated Vital Signs BP (!) 159/95   Pulse 78   Temp 97.8 F (36.6 C)   Resp 16   SpO2 100%   Visual Acuity Right Eye Distance:   Left Eye Distance:   Bilateral Distance:    Right Eye Near:   Left Eye Near:    Bilateral Near:     Physical Exam  Constitutional: She is oriented to person, place, and time. She appears well-developed and well-nourished. She appears distressed.  HENT:  Right Ear: External ear normal.  Left Ear: External ear normal.  Broken tooth #17 with cavity  Eyes: Conjunctivae are normal.  Neck: Normal range of motion. Neck supple.  Pulmonary/Chest:  Effort normal.  Musculoskeletal: Normal range of motion.  Neurological: She is alert and oriented to person, place, and time.  Skin: Skin is warm and dry.  Nursing note and vitals reviewed.    UC Treatments / Results  Labs (all labs ordered are listed, but only abnormal results are displayed) Labs Reviewed - No data to display  EKG None  Radiology No results found.  Procedures Procedures (including critical care time)  Medications Ordered in UC Medications  ketorolac (TORADOL) injection 60 mg (has no administration in time range)    Initial Impression / Assessment and Plan / UC Course  I have reviewed the triage vital signs and the nursing notes.  Pertinent labs & imaging results that were available during my care of the patient were reviewed by me and considered in my medical decision making (see chart for details).     Final Clinical Impressions(s) / UC Diagnoses   Final diagnoses:  Chronic dental pain     Discharge Instructions     You will need to follow-up with dentistry and your primary care doctor from here.    ED Prescriptions    Medication Sig Dispense Auth. Provider   traMADol (ULTRAM) 50 MG tablet Take 1 tablet (50 mg total) by mouth every 6 (six) hours as needed. 15 tablet Robyn Haber, MD     Controlled Substance Prescriptions [place] Controlled Substance Registry consulted? Yes, I have consulted the [place] Controlled Substances Registry for this patient, and feel the risk/benefit ratio today is favorable for proceeding with this prescription for a controlled substance.   Robyn Haber, MD 05/28/18 1649    Robyn Haber, MD 05/28/18 [phone]

## 2018-05-28 NOTE — Discharge Instructions (Addendum)
You will need to follow-up with dentistry and your primary care doctor from here.

## 2018-05-30 ENCOUNTER — Emergency Department (HOSPITAL_COMMUNITY)
Admission: EM | Admit: 2018-05-30 | Discharge: 2018-05-30 | Disposition: A | Discharge status: Home / Self Care | Payer: BLUE CROSS/BLUE SHIELD | Attending: Emergency Medicine | Admitting: Emergency Medicine

## 2018-05-30 ENCOUNTER — Ambulatory Visit: Payer: BLUE CROSS/BLUE SHIELD | Admitting: Family Medicine

## 2018-05-30 ENCOUNTER — Encounter: Payer: Self-pay | Admitting: Family Medicine

## 2018-05-30 ENCOUNTER — Other Ambulatory Visit: Payer: Self-pay

## 2018-05-30 ENCOUNTER — Encounter (HOSPITAL_COMMUNITY): Payer: Self-pay | Admitting: Emergency Medicine

## 2018-05-30 VITALS — BP 142/90 | HR 80 | Temp 98.8°F | Wt 251.0 lb

## 2018-05-30 DIAGNOSIS — K029 Dental caries, unspecified: Secondary | ICD-10-CM | POA: Diagnosis not present

## 2018-05-30 DIAGNOSIS — K0889 Other specified disorders of teeth and supporting structures: Secondary | ICD-10-CM | POA: Diagnosis not present

## 2018-05-30 DIAGNOSIS — H66002 Acute suppurative otitis media without spontaneous rupture of ear drum, left ear: Secondary | ICD-10-CM | POA: Diagnosis not present

## 2018-05-30 DIAGNOSIS — K047 Periapical abscess without sinus: Secondary | ICD-10-CM

## 2018-05-30 DIAGNOSIS — H6692 Otitis media, unspecified, left ear: Secondary | ICD-10-CM | POA: Insufficient documentation

## 2018-05-30 DIAGNOSIS — R51 Headache: Secondary | ICD-10-CM | POA: Insufficient documentation

## 2018-05-30 DIAGNOSIS — H6690 Otitis media, unspecified, unspecified ear: Secondary | ICD-10-CM

## 2018-05-30 DIAGNOSIS — R519 Headache, unspecified: Secondary | ICD-10-CM

## 2018-05-30 DIAGNOSIS — I10 Essential (primary) hypertension: Secondary | ICD-10-CM | POA: Diagnosis not present

## 2018-05-30 DIAGNOSIS — Z79899 Other long term (current) drug therapy: Secondary | ICD-10-CM | POA: Diagnosis not present

## 2018-05-30 MED ORDER — NAPROXEN 500 MG PO TABS: 500.0000 mg | ORAL_TABLET | Freq: Two times a day (BID) | ORAL | 0 refills | Status: DC

## 2018-05-30 MED ORDER — CEFUROXIME AXETIL 500 MG PO TABS: 500.0000 mg | ORAL_TABLET | Freq: Two times a day (BID) | ORAL | 0 refills | Status: AC

## 2018-05-30 MED ORDER — KETOROLAC TROMETHAMINE 30 MG/ML IJ SOLN
30.0000 mg | Freq: Once | INTRAMUSCULAR | Status: AC
  Administered 2018-05-30: 30 mg via INTRAMUSCULAR
  Filled 2018-05-30: qty 1

## 2018-05-30 NOTE — ED Triage Notes (Signed)
Pt reports she had a "popping in my ear." about a month ago. Pt reports she was given antibiotics. Pt reports she was seen at dentist and told she has a cavity. Pt reports she has an appointment with ENT. Pt reports she developed a headache on Friday.

## 2018-05-30 NOTE — Patient Instructions (Signed)
Dental Abscess A dental abscess is a collection of pus in or around a tooth. What are the causes? This condition is caused by a bacterial infection around the root of the tooth that involves the inner part of the tooth (pulp). It may result from:  Severe tooth decay.  Trauma to the tooth that allows bacteria to enter into the pulp, such as a broken or chipped tooth.  Severe gum disease around a tooth.  What are the signs or symptoms? Symptoms of this condition include:  Severe pain in and around the infected tooth.  Swelling and redness around the infected tooth, in the mouth, or in the face.  Tenderness.  Pus drainage.  Bad breath.  Bitter taste in the mouth.  Difficulty swallowing.  Difficulty opening the mouth.  Nausea.  Vomiting.  Chills.  Swollen neck glands.  Fever.  How is this diagnosed? This condition is diagnosed with examination of the infected tooth. During the exam, your dentist may tap on the infected tooth. Your dentist will also ask about your medical and dental history and may order X-rays. How is this treated? This condition is treated by eliminating the infection. This may be done with:  Antibiotic medicine.  A root canal. This may be performed to save the tooth.  Pulling (extracting) the tooth. This may also involve draining the abscess. This is done if the tooth cannot be saved.  Follow these instructions at home:  Take medicines only as directed by your dentist.  If you were prescribed antibiotic medicine, finish all of it even if you start to feel better.  Rinse your mouth (gargle) often with salt water to relieve pain or swelling.  Do not drive or operate heavy machinery while taking pain medicine.  Do not apply heat to the outside of your mouth.  Keep all follow-up visits as directed by your dentist. This is important. Contact a health care provider if:  Your pain is worse and is not helped by medicine. Get help right away  if:  You have a fever or chills.  Your symptoms suddenly get worse.  You have a very bad headache.  You have problems breathing or swallowing.  You have trouble opening your mouth.  You have swelling in your neck or around your eye. This information is not intended to replace advice given to you by your health care provider. Make sure you discuss any questions you have with your health care provider. Document Released: 08/02/2005 Document Revised: 12/11/2015 Document Reviewed: 07/30/2014 Elsevier Interactive Patient Education  2018 [employer].  Otitis Media, Adult Otitis media is redness, soreness, and puffiness (swelling) in the space just behind your eardrum (middle ear). It may be caused by allergies or infection. It often happens along with a cold. Follow these instructions at home:  Take your medicine as told. Finish it even if you start to feel better.  Only take over-the-counter or prescription medicines for pain, discomfort, or fever as told by your doctor.  Follow up with your doctor as told. Contact a doctor if:  You have otitis media only in one ear, or bleeding from your nose, or both.  You notice a lump on your neck.  You are not getting better in 3-5 days.  You feel worse instead of better. Get help right away if:  You have pain that is not helped with medicine.  You have puffiness, redness, or pain around your ear.  You get a stiff neck.  You cannot move part of  your face (paralysis).  You notice that the bone behind your ear hurts when you touch it. This information is not intended to replace advice given to you by your health care provider. Make sure you discuss any questions you have with your health care provider. Document Released: 01/19/2008 Document Revised: 01/08/2016 Document Reviewed: 02/27/2013 Elsevier Interactive Patient Education  2017 [employer].

## 2018-05-30 NOTE — Progress Notes (Signed)
Subjective:    Patient ID: [name], female    DOB: 1970-09-05, 47 y.o.   MRN: [id number]  No chief complaint on file.   HPI Patient was seen today for acute concern.  Pt endorses L ear pain, HA, and L sided dental abscess/cavity.  Pt was seen by Telemedicine a few wks ago, given amoxicillin 850 mg.  On Saturday pt seen by dentist, dx'd with infection, abscess, and cavity-restarted on Amoxicillin 500 mg.  Pt was seen by UC Monday, given Toradol inj and d/c with toradol.  Pt seen in ED today, dx'd with dental abscess, L otitis media with bleeding noted.  Pt given IM toradol and naproxen.  Pt comes to clinic this afternoon stating the toradol is wearing off and she needs a referral to ENT.  Pt does not note any improvement in symptoms since starting the 2nd course of amoxicillin.  Pt has a dental appointment on Thursday.  Past Medical History:  Diagnosis Date  . Hypertension   . Hypoglycemia   . Radiation 10/21/2015-10/23/2015   Anterior neck area 12 gray in 3 fractions  . Teratoma     Allergies  Allergen Reactions  . Tylox [Oxycodone-Acetaminophen] Swelling    Caused face to swell    ROS General: Denies fever, chills, night sweats, changes in weight, changes in appetite HEENT: Denies changes in vision, rhinorrhea, sore throat  + headache, L ear pain, L sided tooth pain. CV: Denies CP, palpitations, SOB, orthopnea Pulm: Denies SOB, cough, wheezing GI: Denies abdominal pain, nausea, vomiting, diarrhea, constipation GU: Denies dysuria, hematuria, frequency, vaginal discharge Msk: Denies muscle cramps, joint pains Neuro: Denies weakness, numbness, tingling Skin: Denies rashes, bruising Psych: Denies depression, anxiety, hallucinations     Objective:    Blood pressure (!) 142/90, pulse 80, temperature 98.8 F (37.1 C), temperature source Oral, weight 251 lb (113.9 kg), SpO2 96 %.    Gen. Pleasant, well-nourished, in no distress, normal affect  HEENT: [place]/AT, hypertrophic  scar along right jaw-line, poor dentition, numerous caries, tenderness of the left upper molar/gumline.  TTP of left side of face,  nares patent without drainage.  Right TM normal.  Left TM full with mild erythema. Lungs: no accessory muscle use Cardiovascular: RRR, no peripheral edema  Wt Readings from Last 3 Encounters:  05/30/18 251 lb (113.9 kg)  12/20/17 248 lb 1.6 oz (112.5 kg)  08/10/17 250 lb (113.4 kg)    Lab Results  Component Value Date   WBC 6.7 12/20/2017   HGB 14.6 12/20/2017   HCT 42.8 12/20/2017   PLT 288.0 12/20/2017   GLUCOSE 98 12/20/2017   CHOL 228 (H) 12/20/2017   TRIG 170.0 (H) 12/20/2017   HDL 40.10 12/20/2017   LDLDIRECT 177.3 07/06/2013   LDLCALC 154 (H) 12/20/2017   ALT 24 12/20/2017   AST 18 12/20/2017   NA 139 12/20/2017   K 3.6 12/20/2017   CL 99 12/20/2017   CREATININE 0.77 12/20/2017   BUN 18 12/20/2017   CO2 32 12/20/2017   TSH 1.27 12/20/2017   HGBA1C 6.5 12/20/2017    Assessment/Plan:  Infected dental carries  -Discussed the importance of keeping dental appointment on Thursday. -Okay to use Tylenol or ibuprofen as needed for any pain/discomfort. -Given handout - Plan: cefUROXime (CEFTIN) 500 MG tablet  Non-recurrent acute suppurative otitis media of left ear without spontaneous rupture of tympanic membrane  -given no improvement in symptoms and 2nd round of amoxicillin, will change abs to a cephalosporin. -Given handout - Plan: cefUROXime (  CEFTIN) 500 MG tablet, Ambulatory referral to ENT  Follow-up PRN with PCP  Grier Mitts, MD

## 2018-05-30 NOTE — Discharge Instructions (Addendum)
You were seen today for ear pain, dental pain, and headache.  Continue your amoxicillin at home.  Switch from ibuprofen to naproxen.  Not take both.  Continue nasal saline and Flonase.  Follow-up with your dentist and primary physician as well as ENT.

## 2018-05-30 NOTE — ED Provider Notes (Signed)
[facility] EMERGENCY DEPARTMENT Provider Note   CSN: [id number] Arrival date & time: 05/30/18  0202     History   Chief Complaint Chief Complaint  Patient presents with  . Otalgia  . Headache    HPI [name] is a 47 y.o. female.  HPI  This is a 47 year old female who presents with headache.  Patient reports 1 month history of ear pain.  She states that she "hurt my ear pop" and had subsequent pain.  She was treated by telemedicine with antibiotics.  She is due to see ENT on Thursday.  She had recurrence of pain over the left jaw and tooth.  She saw a dentist and was diagnosed with dental infection, cavity, and abscess.  She is currently on amoxicillin.  She states that she has had increasing pain over the left side of her jaw and ear as well as posterior head.  She rates her pain at 9 out of 10.  She has been taking ibuprofen with minimal relief.  She did go to urgent care yesterday and had relief with Toradol.  She was discharged with tramadol which she states is not helping.  She denies any neck pain or fevers.  Denies difficulty swallowing.  Past Medical History:  Diagnosis Date  . Hypertension   . Hypoglycemia   . Radiation 10/21/2015-10/23/2015   Anterior neck area 12 gray in 3 fractions  . Teratoma     Patient Active Problem List   Diagnosis Date Noted  . Notalgia 08/10/2017  . Morbid obesity ([place]) 02/18/2017  . OSA (obstructive sleep apnea) 01/24/2017  . 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 Snoring 10/22/2016  . Nocturnal dyspnea/choking   10/22/2016  . Keloid of skin 09/25/2015  . Essential hypertension, benign 09/27/2014  . Teratoma 08/07/2014  . Severe hypertension 06/08/2013  . HYPERLIPIDEMIA, MILD, WITH LOW HDL 07/23/2009  . SHORTNESS OF BREATH 02/11/2009  . HYPERTENSION 05/07/2008  . KELOID SCAR 05/07/2008  . HEADACHE 05/22/2007    Past Surgical History:  Procedure Laterality Date  . ABDOMINAL HERNIA REPAIR      . ABDOMINAL HYSTERECTOMY    . CESAREAN SECTION    . CRYOTHERAPY  2013   to neck  . DERMOID CYST  EXCISION    . ECTOPIC PREGNANCY SURGERY    . EXCISION MASS NECK Bilateral 10/20/2015   Procedure: EXCISION OF LARGE KELOIDS SEVERE RIGHT AND LEFT NECK WITH PLASTIC RECONSTRUCTION;  Surgeon: Cristine Polio, MD;  Location: [facility];  Service: Plastics;  Laterality: Bilateral;  . HAND SURGERY     amputation of right index finger     OB History   None      Home Medications    Prior to Admission medications   Medication Sig Start Date End Date Taking? Authorizing Provider  amoxicillin (AMOXIL) 500 MG capsule Take 500 mg by mouth 3 (three) times daily.    [provider]  fluticasone (FLONASE) 50 MCG/ACT nasal spray Place 2 sprays into both nostrils daily. 05/03/18   Tasia Catchings, Amy V, PA-C  naproxen (NAPROSYN) 500 MG tablet Take 1 tablet (500 mg total) by mouth 2 (two) times daily. 05/30/18   Maudene Stotler, Barbette Hair, MD  Olmesartan-amLODIPine-HCTZ 40-5-25 MG TABS Take 1 tablet by mouth daily. 04/21/18   Panosh, Standley Brooking, MD  traMADol (ULTRAM) 50 MG tablet Take 1 tablet (50 mg total) by mouth every 6 (six) hours as needed. 05/28/18   Robyn Haber, MD  trimethoprim-polymyxin b Mayra Neer) ophthalmic  solution Place 1 drop into the right eye every 4 (four) hours. 05/10/18   Sharion Balloon, FNP    Family History Family History  Problem Relation Age of Onset  . Hypertension Other     Social History Social History   Tobacco Use  . Smoking status: Never Smoker  . Smokeless tobacco: Never Used  Substance Use Topics  . Alcohol use: Yes    Comment: socially  . Drug use: No     Allergies   Tylox [oxycodone-acetaminophen]   Review of Systems Review of Systems  Constitutional: Negative for chills and fever.  HENT: Positive for dental problem and ear pain. Negative for trouble swallowing.   Respiratory: Negative for shortness of breath.   Cardiovascular: Negative for  chest pain.  Gastrointestinal: Negative for nausea and vomiting.  Musculoskeletal: Negative for neck pain.  Neurological: Positive for headaches.  All other systems reviewed and are negative.    Physical Exam Updated Vital Signs BP 139/79 (BP Location: Right Arm)   Pulse 64   Temp 98.1 F (36.7 C) (Oral)   Resp 16   SpO2 95%   Physical Exam  Constitutional: She is oriented to person, place, and time. She appears well-developed and well-nourished.  Obese, nontoxic-appearing  HENT:  Head: Normocephalic and atraumatic.  Mouth/Throat: Oropharynx is clear and moist.  Generally poor dentition with multiple dental caries, there is tenderness to palpation over the left upper molar along the gumline, no palpable abscess, no fullness noted under the tongue, no significant facial swelling, left TM appears full, it is intact, mild erythema with some bleeding noted at 1:00, no mastoid tenderness  Eyes: Pupils are equal, round, and reactive to light.  Neck: Normal range of motion. Neck supple.  Cardiovascular: Normal rate and regular rhythm.  Pulmonary/Chest: Effort normal. No respiratory distress.  Neurological: She is alert and oriented to person, place, and time.  Skin: Skin is warm and dry.  Psychiatric: She has a normal mood and affect.  Nursing note and vitals reviewed.    ED Treatments / Results  Labs (all labs ordered are listed, but only abnormal results are displayed) Labs Reviewed - No data to display  EKG None  Radiology No results found.  Procedures Procedures (including critical care time)  Medications Ordered in ED Medications  ketorolac (TORADOL) 30 MG/ML injection 30 mg (30 mg Intramuscular Given 05/30/18 0501)     Initial Impression / Assessment and Plan / ED Course  I have reviewed the triage vital signs and the nursing notes.  Pertinent labs & imaging results that were available during my care of the patient were reviewed by me and considered in my  medical decision making (see chart for details).     Patient presents with headache, ear pain, and dental pain.  She is due to see ENT regarding 1 month of ear pain.  She saw her dentist and was placed on amoxicillin.  On exam she is nontoxic-appearing.  Denies infectious symptoms.  Vital signs are reassuring.  She has some tenderness of the gumline.  No signs of deep space infection.  She also has evidence of a left ear effusion as well as mild erythema and some blood.  She denies any barotrauma.  TM appears intact.  Difficult to know whether the headache is a result of the toothache or what appears to be otitis media.  She is on appropriate antibiotics.  At this time does not appear ill and does not have any evidence of deep space  infection or mastoiditis.  Patient was given IM Toradol and had good improvement of her headache.  She reports that she is taking scheduled anti-inflammatories.  We will switch from ibuprofen to naproxen.  She has tramadol.  I encouraged her to continue her antibiotic which would cover both dental and otitis media.  I also recommend that she start Flonase and nasal saline to clear the middle ear.  Follow-up with ENT and dentistry as recommended.  After history, exam, and medical workup I feel the patient has been appropriately medically screened and is safe for discharge home. Pertinent diagnoses were discussed with the patient. Patient was given return precautions.   Final Clinical Impressions(s) / ED Diagnoses   Final diagnoses:  Acute nonintractable headache, unspecified headache type  Subacute otitis media, unspecified otitis media type  Pain, dental    ED Discharge Orders         Ordered    naproxen (NAPROSYN) 500 MG tablet  2 times daily     05/30/18 0601           Dariann Huckaba, Barbette Hair, MD 05/30/18 [phone]

## 2018-05-30 NOTE — ED Notes (Signed)
Provider bedside.

## 2018-05-31 ENCOUNTER — Encounter: Payer: Self-pay | Admitting: Family Medicine

## 2018-09-24 ENCOUNTER — Encounter (HOSPITAL_COMMUNITY): Payer: Self-pay | Admitting: Emergency Medicine

## 2018-09-24 ENCOUNTER — Emergency Department (HOSPITAL_COMMUNITY)
Admission: EM | Admit: 2018-09-24 | Discharge: 2018-09-24 | Disposition: A | Discharge status: Home / Self Care | Payer: BLUE CROSS/BLUE SHIELD | Attending: Emergency Medicine | Admitting: Emergency Medicine

## 2018-09-24 ENCOUNTER — Other Ambulatory Visit: Payer: Self-pay

## 2018-09-24 ENCOUNTER — Ambulatory Visit (HOSPITAL_COMMUNITY): Payer: BLUE CROSS/BLUE SHIELD

## 2018-09-24 ENCOUNTER — Ambulatory Visit (INDEPENDENT_AMBULATORY_CARE_PROVIDER_SITE_OTHER): Payer: BLUE CROSS/BLUE SHIELD

## 2018-09-24 ENCOUNTER — Emergency Department (HOSPITAL_COMMUNITY): Payer: BLUE CROSS/BLUE SHIELD

## 2018-09-24 ENCOUNTER — Encounter (HOSPITAL_COMMUNITY): Payer: Self-pay

## 2018-09-24 ENCOUNTER — Ambulatory Visit (INDEPENDENT_AMBULATORY_CARE_PROVIDER_SITE_OTHER)
Admission: EM | Admit: 2018-09-24 | Discharge: 2018-09-24 | Disposition: A | Discharge status: Home / Self Care | Payer: BLUE CROSS/BLUE SHIELD | Source: Home / Self Care

## 2018-09-24 DIAGNOSIS — R509 Fever, unspecified: Secondary | ICD-10-CM | POA: Diagnosis not present

## 2018-09-24 DIAGNOSIS — I10 Essential (primary) hypertension: Secondary | ICD-10-CM | POA: Diagnosis not present

## 2018-09-24 DIAGNOSIS — Z79899 Other long term (current) drug therapy: Secondary | ICD-10-CM | POA: Insufficient documentation

## 2018-09-24 DIAGNOSIS — M79602 Pain in left arm: Secondary | ICD-10-CM | POA: Diagnosis not present

## 2018-09-24 DIAGNOSIS — R072 Precordial pain: Secondary | ICD-10-CM | POA: Diagnosis not present

## 2018-09-24 DIAGNOSIS — R5383 Other fatigue: Secondary | ICD-10-CM

## 2018-09-24 DIAGNOSIS — R079 Chest pain, unspecified: Secondary | ICD-10-CM

## 2018-09-24 DIAGNOSIS — S29019A Strain of muscle and tendon of unspecified wall of thorax, initial encounter: Secondary | ICD-10-CM

## 2018-09-24 LAB — CBC
HCT: 44.4 % (ref 36.0–46.0)
Hemoglobin: 14.7 g/dL (ref 12.0–15.0)
MCH: 29.3 pg (ref 26.0–34.0)
MCHC: 33.1 g/dL (ref 30.0–36.0)
MCV: 88.4 fL (ref 80.0–100.0)
Platelets: 279 10*3/uL (ref 150–400)
RBC: 5.02 MIL/uL (ref 3.87–5.11)
RDW: 12.5 % (ref 11.5–15.5)
WBC: 8.6 10*3/uL (ref 4.0–10.5)
nRBC: 0 % (ref 0.0–0.2)

## 2018-09-24 LAB — COMPREHENSIVE METABOLIC PANEL
ALK PHOS: 101 U/L (ref 38–126)
ALT: 25 U/L (ref 0–44)
AST: 25 U/L (ref 15–41)
Albumin: 4.1 g/dL (ref 3.5–5.0)
Anion gap: 12 (ref 5–15)
BUN: 10 mg/dL (ref 6–20)
CALCIUM: 9.8 mg/dL (ref 8.9–10.3)
CO2: 28 mmol/L (ref 22–32)
CREATININE: 0.78 mg/dL (ref 0.44–1.00)
Chloride: 102 mmol/L (ref 98–111)
GFR calc Af Amer: >60 mL/min (ref >60)
GFR calc non Af Amer: >60 mL/min (ref >60)
Glucose, Bld: 102 mg/dL — ABNORMAL HIGH (ref 70–99)
Potassium: 3.6 mmol/L (ref 3.5–5.1)
Sodium: 142 mmol/L (ref 135–145)
TOTAL PROTEIN: 7.9 g/dL (ref 6.5–8.1)
Total Bilirubin: 0.9 mg/dL (ref 0.3–1.2)

## 2018-09-24 LAB — I-STAT TROPONIN, ED: Troponin i, poc: 0.02 ng/mL (ref 0.00–0.08)

## 2018-09-24 IMAGING — DX DG CHEST 2V
2 series · 2 of 2 positions shown · non-contrast
Comparison: [DATE]

CLINICAL DATA: Chest pain

EXAM:
CHEST - 2 VIEW

[chest pa]
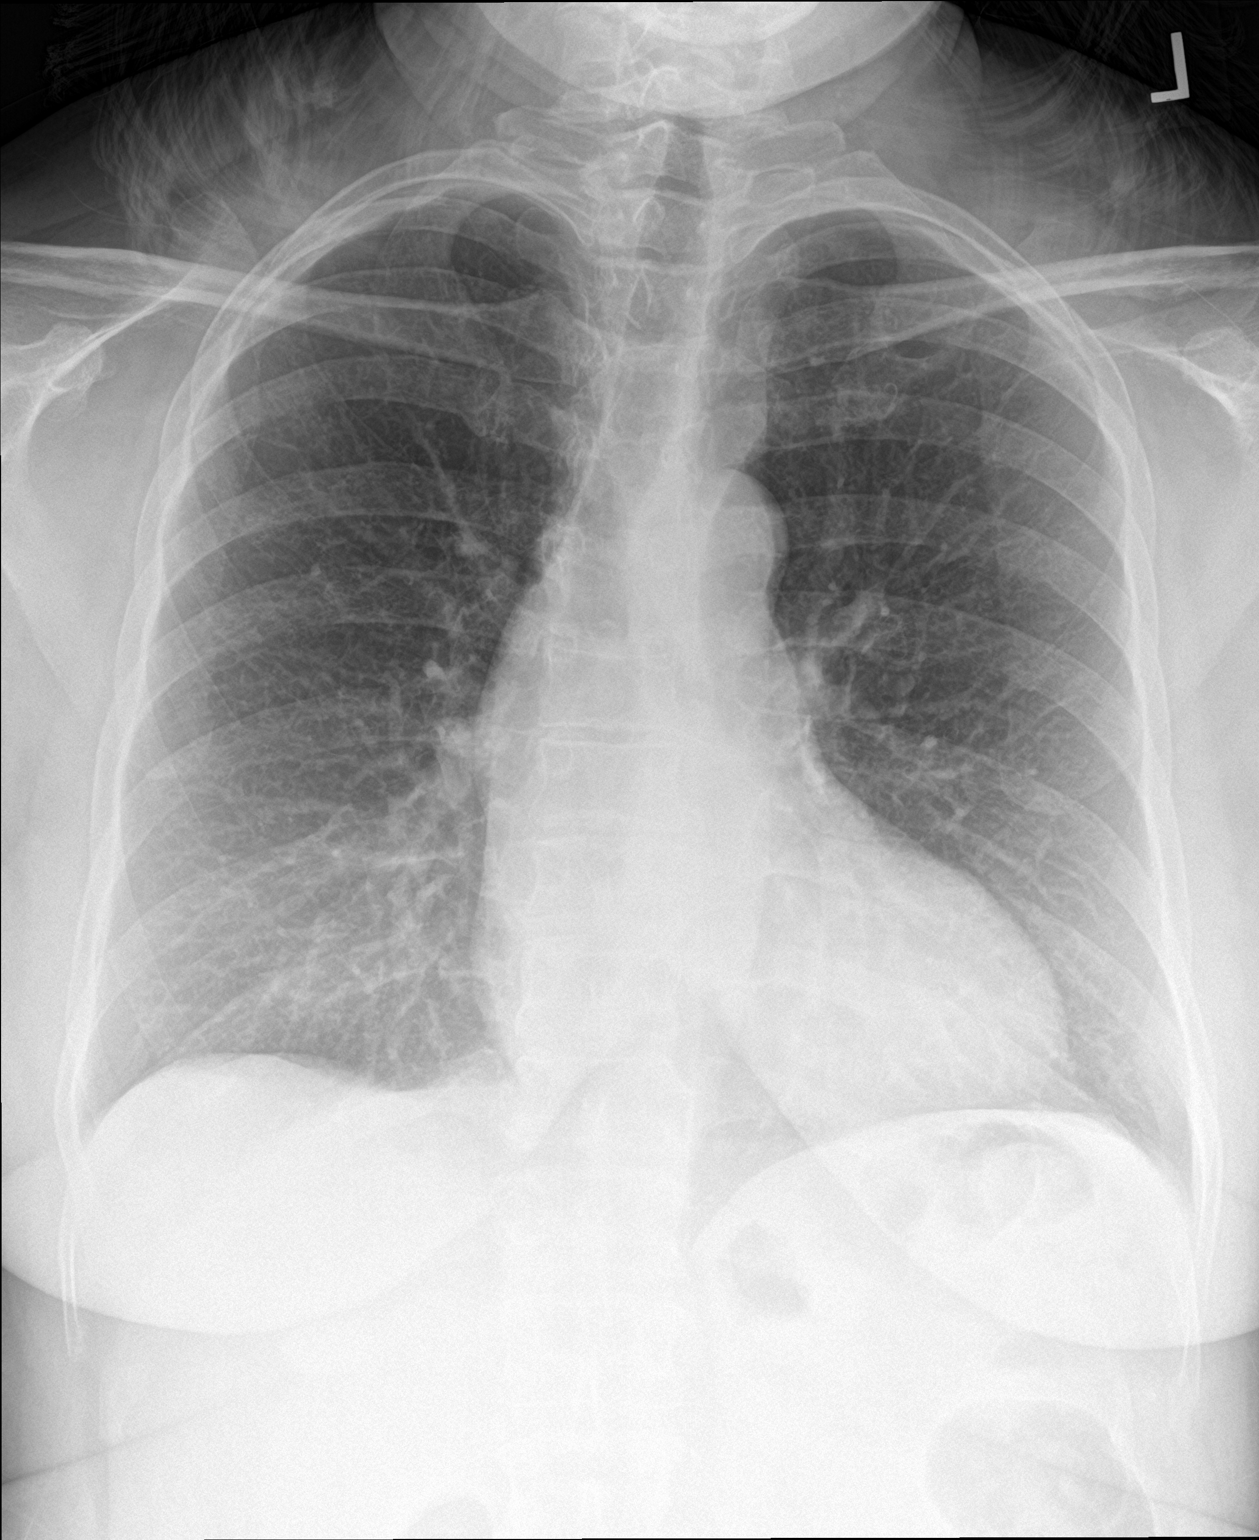

[chest lat]
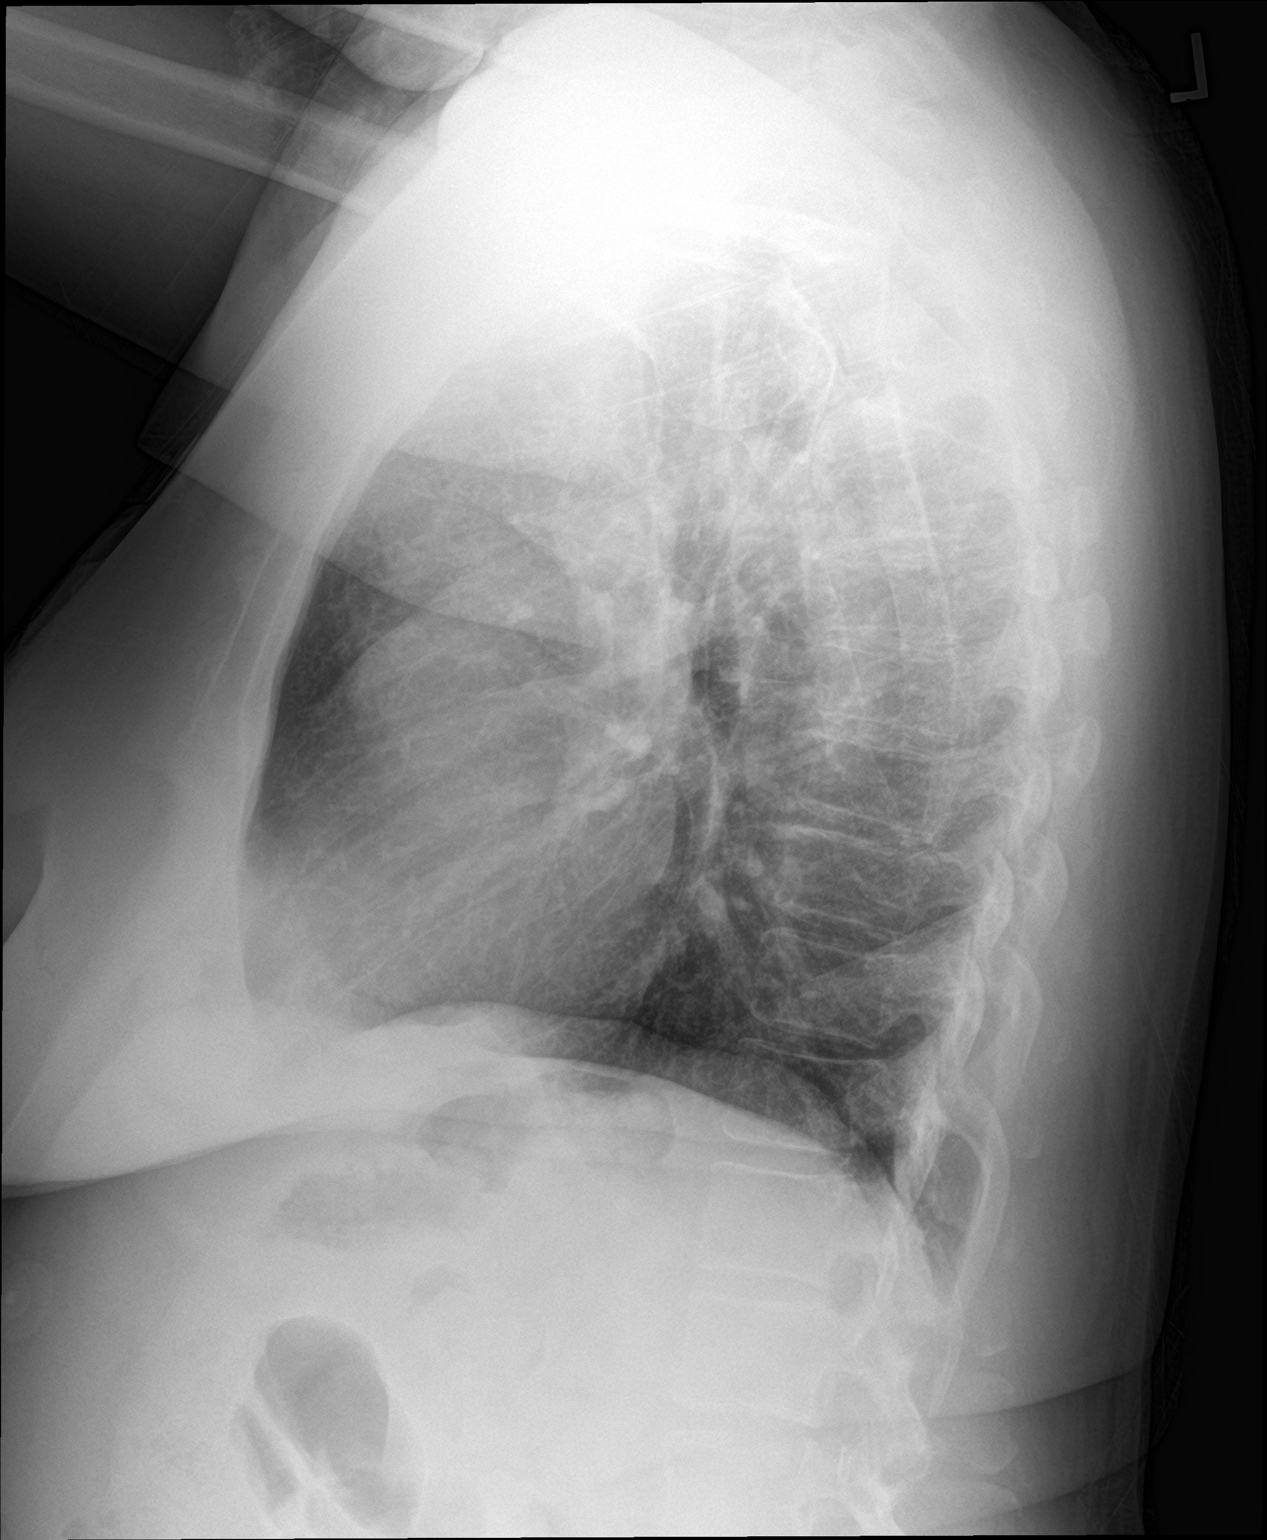

[2 of 2 positions shown; findings below may reference images not displayed]

FINDINGS: Lungs are clear.  No pleural effusion or pneumothorax.

The heart is normal in size.

Visualized osseous structures are within normal limits.
IMPRESSION: Normal chest radiographs.

## 2018-09-24 IMAGING — CR DG CHEST 2V
2 series · 2 of 2 positions shown · non-contrast
Comparison: [DATE]

CLINICAL DATA: Left chest pain

EXAM:
CHEST - 2 VIEW

[chest pa]
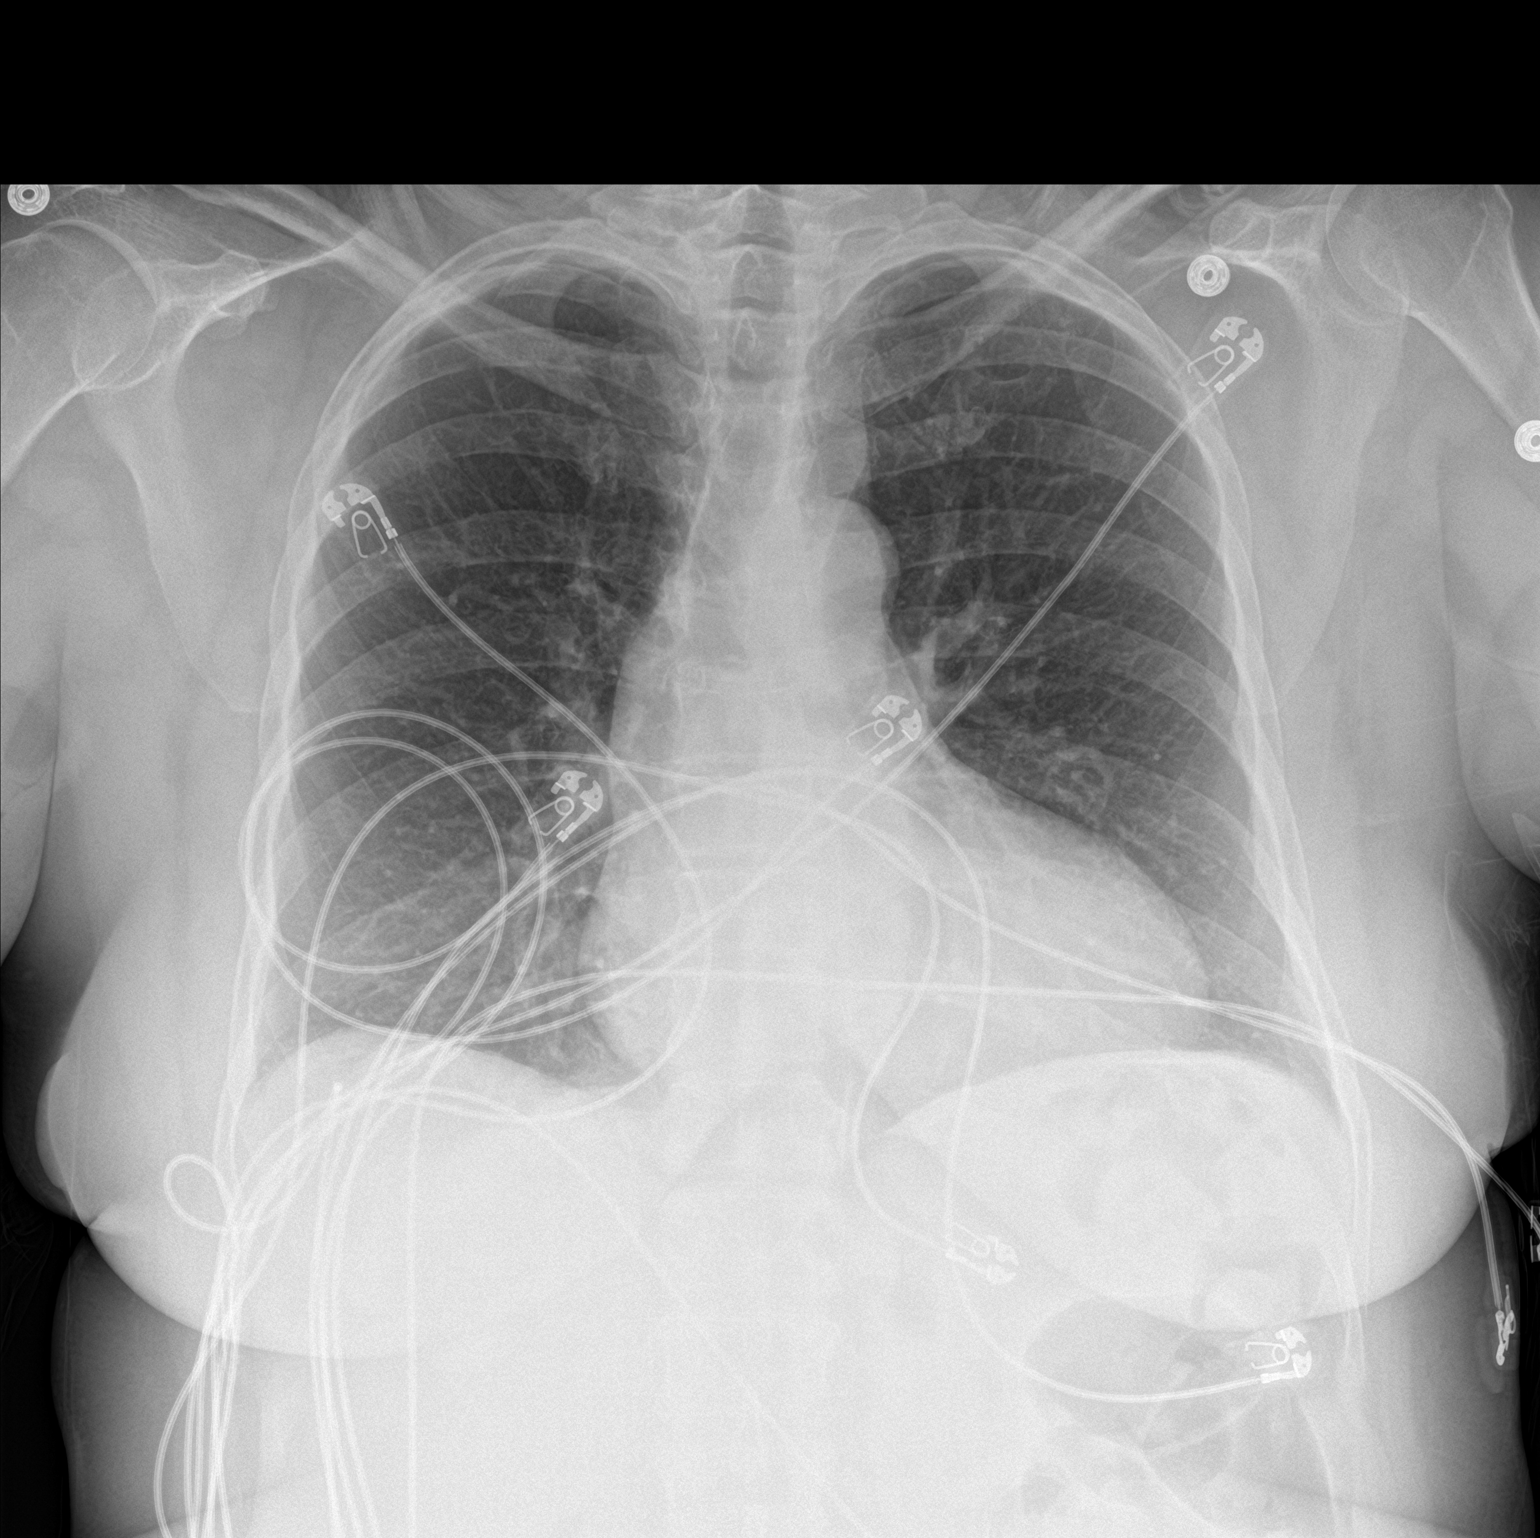

[chest lat]
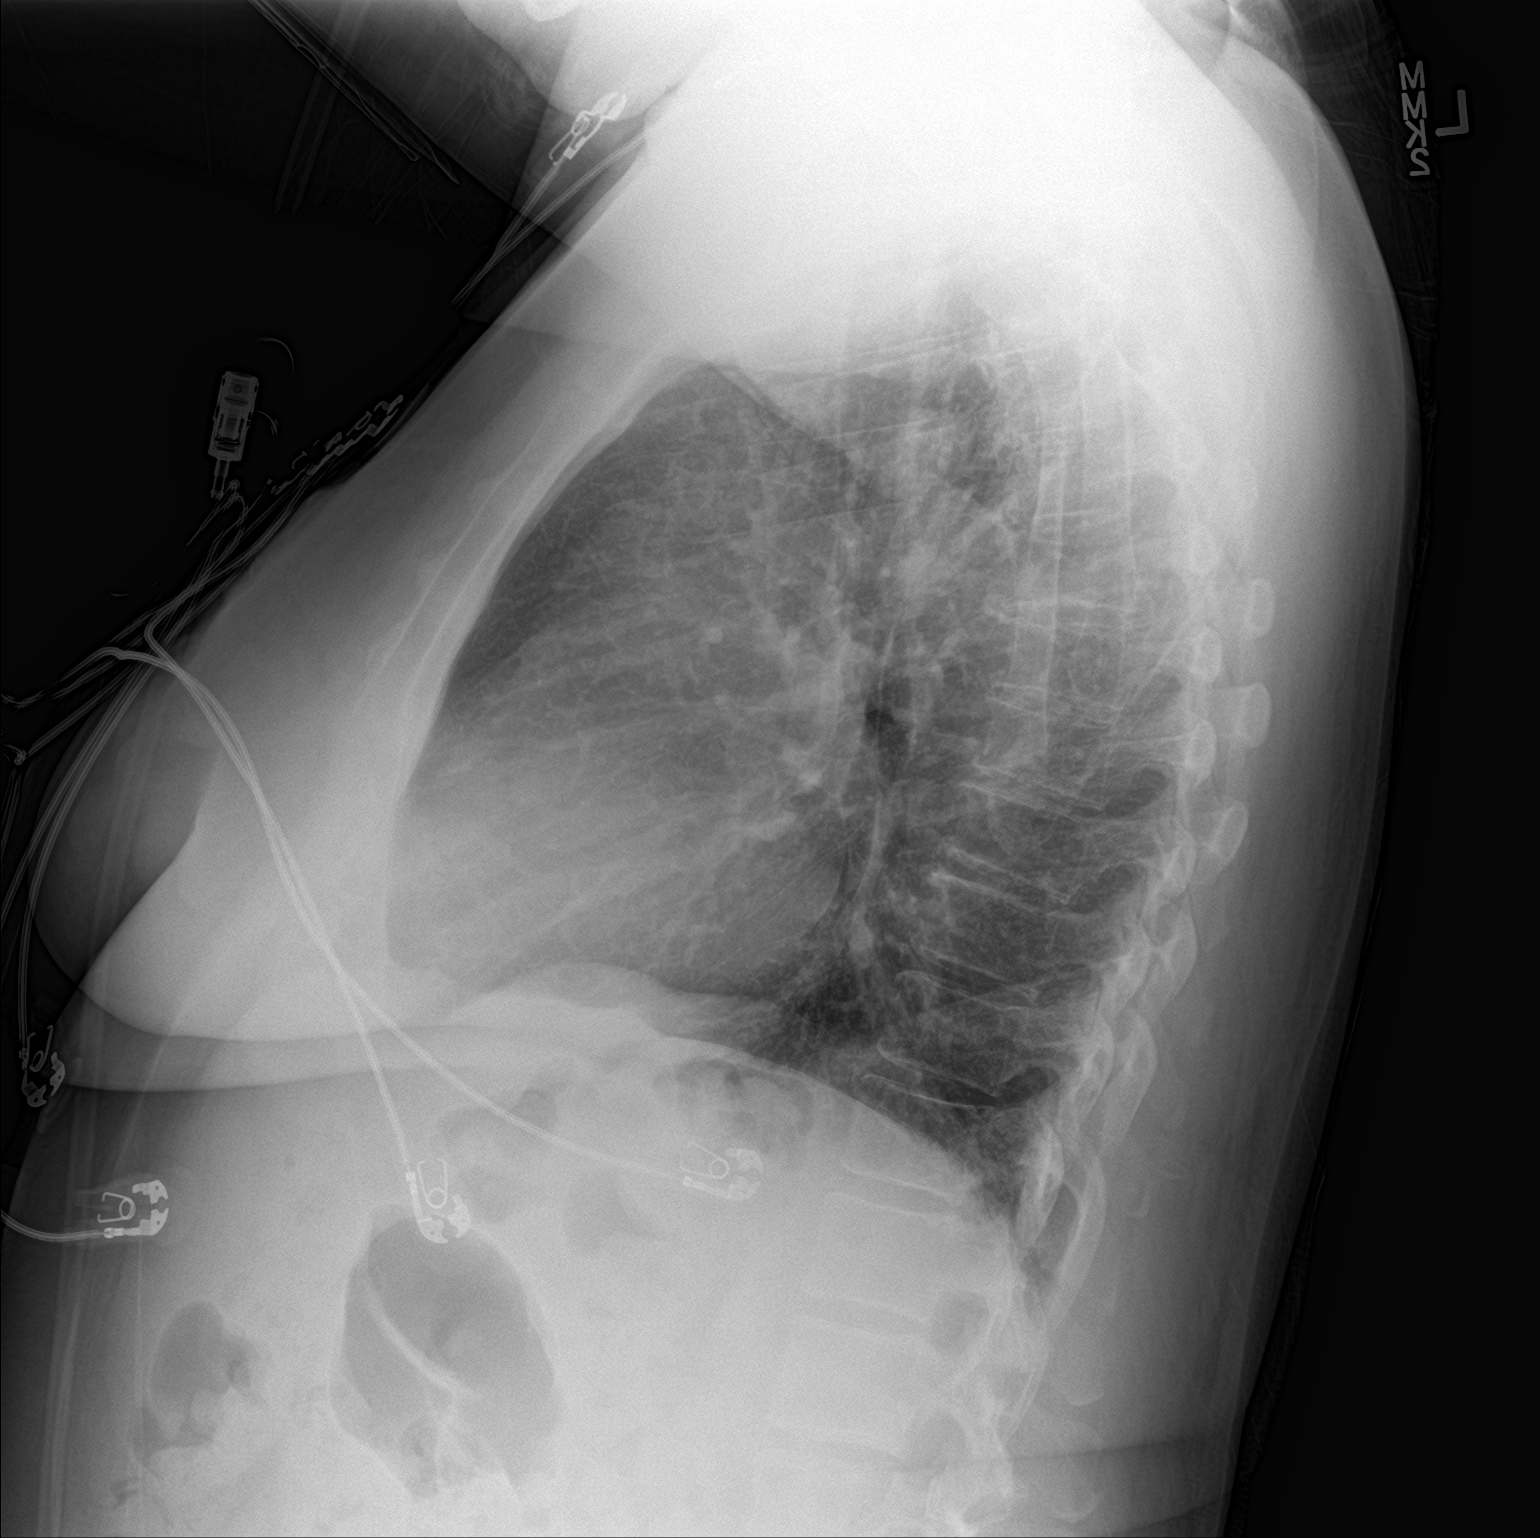

[2 of 2 positions shown; findings below may reference images not displayed]

FINDINGS: Lungs are clear.  No pleural effusion or pneumothorax.

The heart is normal in size.

Visualized osseous structures are within normal limits.
IMPRESSION: Normal chest radiographs.

## 2018-09-24 MED ORDER — METHOCARBAMOL 500 MG PO TABS
750.0000 mg | ORAL_TABLET | Freq: Once | ORAL | Status: AC
  Administered 2018-09-24: 750 mg via ORAL
  Filled 2018-09-24: qty 2

## 2018-09-24 MED ORDER — METHOCARBAMOL 750 MG PO TABS: 750.0000 mg | ORAL_TABLET | Freq: Three times a day (TID) | ORAL | 0 refills | Status: DC | PRN

## 2018-09-24 MED ORDER — IBUPROFEN 400 MG PO TABS
400.0000 mg | ORAL_TABLET | Freq: Once | ORAL | Status: AC
  Administered 2018-09-24: 400 mg via ORAL
  Filled 2018-09-24: qty 1

## 2018-09-24 NOTE — Discharge Instructions (Addendum)
I would like for you to go to the ER for further evaluation of your chest pain Your chest x-ray was normal EKG was inconclusive I have no other explanation for your chest pain. They need to do some blood work and possible cardiology consult.

## 2018-09-24 NOTE — ED Notes (Signed)
Patient verbalizes understanding of discharge instructions. Opportunity for questioning and answers were provided. Armband removed by staff, pt discharged from ED.  

## 2018-09-24 NOTE — Discharge Instructions (Addendum)
It was our pleasure to provide your ER care today - we hope that you feel better.  Take motrin or aleve as need. You may also try robaxin as need for muscle pain/spasm - no driving when taking.  Your blood pressure is high today - continue your blood pressure medication, limit salt intake, and follow up with primary care doctor in the coming week. Also follow up with your doctor  this week regarding recent back/chest discomfort.   Return to ER if worse, new symptoms, trouble breathing, high fevers, recurrent/persistent chest pain, other concern.

## 2018-09-24 NOTE — ED Provider Notes (Signed)
[facility] EMERGENCY DEPARTMENT Provider Note   CSN: [id number] Arrival date & time: 09/24/18  1727     History   Chief Complaint Chief Complaint  Patient presents with  . Chest Pain    HPI [name] is a 48 y.o. female.  Patient c/o pain to left upper chest, left upper back and shoulder area for the past few days. Pain occurs at rest, constant, dull to sharp, radiates to left shoulder. Pain is not pleuritic. No associated sob, nv or diaphoresis. No cough or uri symptoms. No fever or chills. No heartburn. Denies chest or back strain. No hx cad or fam hx premature cad. No hx dvt or pe. No recent surgery, prolonged immobility, trauma or travel. No ocp. Non smoker.   The history is provided by the patient.  Chest Pain  Associated symptoms: no abdominal pain, no cough, no fever, no headache, no numbness, no shortness of breath, no vomiting and no weakness     Past Medical History:  Diagnosis Date  . Hypertension   . Hypoglycemia   . Radiation 10/21/2015-10/23/2015   Anterior neck area 12 gray in 3 fractions  . Teratoma     Patient Active Problem List   Diagnosis Date Noted  . Notalgia 08/10/2017  . Morbid obesity ([place]) 02/18/2017  . OSA (obstructive sleep apnea) 01/24/2017  . 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 Snoring 10/22/2016  . Nocturnal dyspnea/choking   10/22/2016  . Keloid of skin 09/25/2015  . Essential hypertension, benign 09/27/2014  . Teratoma 08/07/2014  . Severe hypertension 06/08/2013  . HYPERLIPIDEMIA, MILD, WITH LOW HDL 07/23/2009  . SHORTNESS OF BREATH 02/11/2009  . HYPERTENSION 05/07/2008  . KELOID SCAR 05/07/2008  . HEADACHE 05/22/2007    Past Surgical History:  Procedure Laterality Date  . ABDOMINAL HERNIA REPAIR    . ABDOMINAL HYSTERECTOMY    . CESAREAN SECTION    . CRYOTHERAPY  2013   to neck  . DERMOID CYST  EXCISION    . ECTOPIC PREGNANCY SURGERY    . EXCISION MASS NECK Bilateral 10/20/2015   Procedure: EXCISION OF LARGE KELOIDS SEVERE RIGHT AND LEFT NECK WITH PLASTIC RECONSTRUCTION;  Surgeon: Cristine Polio, MD;  Location: [facility];  Service: Plastics;  Laterality: Bilateral;  . HAND SURGERY     amputation of right index finger     OB History   No obstetric history on file.      Home Medications    Prior to Admission medications   Medication Sig Start Date End Date Taking? Authorizing Provider  naproxen (NAPROSYN) 500 MG tablet Take 1 tablet (500 mg total) by mouth 2 (two) times daily. 05/30/18   Horton, Barbette Hair, MD  Olmesartan-amLODIPine-HCTZ 40-5-25 MG TABS Take 1 tablet by mouth daily. 04/21/18   Panosh, Standley Brooking, MD    Family History Family History  Problem Relation Age of Onset  . Hypertension Other     Social History Social History   Tobacco Use  . Smoking status: Never Smoker  . Smokeless tobacco: Never Used  Substance Use Topics  . Alcohol use: Yes    Comment: socially  . Drug use: No     Allergies   Tylox [oxycodone-acetaminophen]   Review of Systems Review of Systems  Constitutional: Negative for fever.  HENT: Negative for sore throat.   Eyes: Negative for redness.  Respiratory: Negative for cough and shortness of breath.   Cardiovascular: Positive for chest pain. Negative for leg swelling.  Gastrointestinal: Negative for abdominal pain and vomiting.  Genitourinary: Negative for flank pain.  Musculoskeletal: Negative for neck pain.  Skin: Negative for rash.  Neurological: Negative for weakness, numbness and headaches.  Hematological: Does not bruise/bleed easily.  Psychiatric/Behavioral: Negative for confusion.     Physical Exam Updated Vital Signs BP (!) 146/105   Pulse 81   Temp 99.4 F (37.4 C) (Oral)   Resp 18   Ht 1.829 m (6')   Wt 113.4 kg   SpO2 99%   BMI 33.91 kg/m   Physical Exam Vitals signs and nursing note reviewed.  Constitutional:      Appearance: Normal appearance. She is  well-developed.  HENT:     Head: Atraumatic.     Nose: Nose normal.     Mouth/Throat:     Mouth: Mucous membranes are moist.  Eyes:     General: No scleral icterus.    Conjunctiva/sclera: Conjunctivae normal.     Pupils: Pupils are equal, round, and reactive to light.  Neck:     Musculoskeletal: Normal range of motion and neck supple. No neck rigidity or muscular tenderness.     Trachea: No tracheal deviation.  Cardiovascular:     Rate and Rhythm: Normal rate and regular rhythm.     Pulses: Normal pulses.     Heart sounds: Normal heart sounds. No murmur. No friction rub. No gallop.   Pulmonary:     Effort: Pulmonary effort is normal. No respiratory distress.     Breath sounds: Normal breath sounds.  Abdominal:     General: Bowel sounds are normal. There is no distension.     Palpations: Abdomen is soft.     Tenderness: There is no abdominal tenderness. There is no guarding.  Genitourinary:    Comments: No cva tenderness.  Musculoskeletal:        General: No swelling.     Right lower leg: No edema.     Left lower leg: No edema.     Comments: Marked tenderness medial and inf to left scapula, reproducing symptoms. No skin changes or sts noted.   Lymphadenopathy:     Cervical: No cervical adenopathy.  Skin:    General: Skin is warm and dry.     Findings: No rash.  Neurological:     Mental Status: She is alert.     Comments: Alert, speech normal.   Psychiatric:        Mood and Affect: Mood normal.      ED Treatments / Results  Labs (all labs ordered are listed, but only abnormal results are displayed) Results for orders placed or performed during the hospital encounter of 09/24/18  CBC  Result Value Ref Range   WBC 8.6 4.0 - 10.5 K/uL   RBC 5.02 3.87 - 5.11 MIL/uL   Hemoglobin 14.7 12.0 - 15.0 g/dL   HCT 44.4 36.0 - 46.0 %   MCV 88.4 80.0 - 100.0 fL   MCH 29.3 26.0 - 34.0 pg   MCHC 33.1 30.0 - 36.0 g/dL   RDW 12.5 11.5 - 15.5 %   Platelets 279 150 - 400 K/uL    nRBC 0.0 0.0 - 0.2 %  Comprehensive metabolic panel  Result Value Ref Range   Sodium 142 135 - 145 mmol/L   Potassium 3.6 3.5 - 5.1 mmol/L   Chloride 102 98 - 111 mmol/L   CO2 28 22 - 32 mmol/L   Glucose, Bld 102 (H) 70 - 99 mg/dL   BUN 10 6 -  20 mg/dL   Creatinine, Ser 0.78 0.44 - 1.00 mg/dL   Calcium 9.8 8.9 - 10.3 mg/dL   Total Protein 7.9 6.5 - 8.1 g/dL   Albumin 4.1 3.5 - 5.0 g/dL   AST 25 15 - 41 U/L   ALT 25 0 - 44 U/L   Alkaline Phosphatase 101 38 - 126 U/L   Total Bilirubin 0.9 0.3 - 1.2 mg/dL   GFR calc non Af Amer >60 >60 mL/min   GFR calc Af Amer >60 >60 mL/min   Anion gap 12 5 - 15  I-stat troponin, ED  Result Value Ref Range   Troponin i, poc 0.02 0.00 - 0.08 ng/mL   Comment 3           Dg Chest 2 View  Result Date: 09/24/2018 CLINICAL DATA:  Left chest pain EXAM: CHEST - 2 VIEW COMPARISON:  09/24/2018 FINDINGS: Lungs are clear.  No pleural effusion or pneumothorax. The heart is normal in size. Visualized osseous structures are within normal limits. IMPRESSION: Normal chest radiographs. Electronically Signed   By: Julian Hy M.D.   On: 09/24/2018 18:42   Dg Chest 2 View  Result Date: 09/24/2018 CLINICAL DATA:  Chest pain EXAM: CHEST - 2 VIEW COMPARISON:  10/07/2016 FINDINGS: Lungs are clear.  No pleural effusion or pneumothorax. The heart is normal in size. Visualized osseous structures are within normal limits. IMPRESSION: Normal chest radiographs. Electronically Signed   By: Julian Hy M.D.   On: 09/24/2018 17:05    ED ECG REPORT   Date: 09/24/2018  Rate: 89  Rhythm: normal sinus rhythm  QRS Axis: normal  Intervals: normal  ST/T Wave abnormalities: normal  Conduction Disutrbances:none  Narrative Interpretation:   Old EKG Reviewed: unchanged  I have personally reviewed the EKG tracing   Radiology Dg Chest 2 View  Result Date: 09/24/2018 CLINICAL DATA:  Chest pain EXAM: CHEST - 2 VIEW COMPARISON:  10/07/2016 FINDINGS: Lungs are clear.  No  pleural effusion or pneumothorax. The heart is normal in size. Visualized osseous structures are within normal limits. IMPRESSION: Normal chest radiographs. Electronically Signed   By: Julian Hy M.D.   On: 09/24/2018 17:05    Procedures Procedures (including critical care time)  Medications Ordered in ED Medications  methocarbamol (ROBAXIN) tablet 750 mg (has no administration in time range)  ibuprofen (ADVIL,MOTRIN) tablet 400 mg (has no administration in time range)     Initial Impression / Assessment and Plan / ED Course  I have reviewed the triage vital signs and the nursing notes.  Pertinent labs & imaging results that were available during my care of the patient were reviewed by me and considered in my medical decision making (see chart for details).  Labs. Ecg. Cxr.   Reviewed nursing notes and prior charts for additional history.   Labs reviewed - after constant symptoms x 3 days, trop is neg. Symptoms/eval does not appear c/w acs, and does appear most c/w msk pain.   cxr reviewed - no pna.   Recheck pt, pain improved. No sob or increased wob. Afebrile.   Pt currently appears stable for d/c.   rec close pcp f/u. Return precautions provided.      Final Clinical Impressions(s) / ED Diagnoses   Final diagnoses:  None    ED Discharge Orders    None       Lajean Saver, MD 09/24/18 1901

## 2018-09-24 NOTE — ED Provider Notes (Signed)
[place]    CSN: [id number] Arrival date & time: 09/24/18  1452     History   Chief Complaint Chief Complaint  Patient presents with  . Chest Pain    HPI Samia R Dutter is a 48 y.o. female.   Is a 48 year old female with past medical history of hypertension, hyperlipidemia, OSA.  She presents with approximately 2 days of left chest discomfort that radiates through to her shoulder blades and into her left upper arm.Marland Kitchen  Describes the discomfort as aching and constant.  It is not worsened by deep breathing or moving.  It is not reproducible.  She denies any associated cough, congestion, fevers.  She has had some generalized myalgias and chills.  She does have low-grade fever here in clinic today.  She is overall fatigue.  Denies any shortness of breath, calf pain or swelling.  Denies any history of blood clots.  Denies any hormone use.  Denies any recent traveling.  ROS per HPI      Past Medical History:  Diagnosis Date  . Hypertension   . Hypoglycemia   . Radiation 10/21/2015-10/23/2015   Anterior neck area 12 gray in 3 fractions  . Teratoma     Patient Active Problem List   Diagnosis Date Noted  . Notalgia 08/10/2017  . Morbid obesity ([place]) 02/18/2017  . OSA (obstructive sleep apnea) 01/24/2017  . 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 Snoring 10/22/2016  . Nocturnal dyspnea/choking   10/22/2016  . Keloid of skin 09/25/2015  . Essential hypertension, benign 09/27/2014  . Teratoma 08/07/2014  . Severe hypertension 06/08/2013  . HYPERLIPIDEMIA, MILD, WITH LOW HDL 07/23/2009  . SHORTNESS OF BREATH 02/11/2009  . HYPERTENSION 05/07/2008  . KELOID SCAR 05/07/2008  . HEADACHE 05/22/2007    Past Surgical History:  Procedure Laterality Date  . ABDOMINAL HERNIA REPAIR    . ABDOMINAL HYSTERECTOMY    . CESAREAN SECTION    . CRYOTHERAPY  2013   to neck  . DERMOID CYST  EXCISION    . ECTOPIC PREGNANCY SURGERY    . EXCISION MASS NECK Bilateral  10/20/2015   Procedure: EXCISION OF LARGE KELOIDS SEVERE RIGHT AND LEFT NECK WITH PLASTIC RECONSTRUCTION;  Surgeon: Cristine Polio, MD;  Location: [facility];  Service: Plastics;  Laterality: Bilateral;  . HAND SURGERY     amputation of right index finger    OB History   No obstetric history on file.      Home Medications    Prior to Admission medications   Medication Sig Start Date End Date Taking? Authorizing Provider  naproxen (NAPROSYN) 500 MG tablet Take 1 tablet (500 mg total) by mouth 2 (two) times daily. 05/30/18  Yes Horton, Barbette Hair, MD  Olmesartan-amLODIPine-HCTZ 40-5-25 MG TABS Take 1 tablet by mouth daily. 04/21/18  Yes Panosh, Standley Brooking, MD    Family History Family History  Problem Relation Age of Onset  . Hypertension Other     Social History Social History   Tobacco Use  . Smoking status: Never Smoker  . Smokeless tobacco: Never Used  Substance Use Topics  . Alcohol use: Yes    Comment: socially  . Drug use: No     Allergies   Tylox [oxycodone-acetaminophen]   Review of Systems Review of Systems   Physical Exam Triage Vital Signs ED Triage Vitals  Enc Vitals Group     BP 09/24/18 1500 (!) 148/102     Pulse Rate 09/24/18 1500 90  Resp 09/24/18 1500 18     Temp 09/24/18 1500 99.6 F (37.6 C)     Temp Source 09/24/18 1500 Temporal     SpO2 09/24/18 1500 98 %     Weight --      Height --      Head Circumference --      Peak Flow --      Pain Score 09/24/18 1457 4     Pain Loc --      Pain Edu? --      Excl. in [place]? --    No data found.  Updated Vital Signs BP (!) 148/102 (BP Location: Left Arm)   Pulse 90   Temp 99.6 F (37.6 C) (Temporal)   Resp 18   SpO2 98%   Visual Acuity Right Eye Distance:   Left Eye Distance:   Bilateral Distance:    Right Eye Near:   Left Eye Near:    Bilateral Near:     Physical Exam Vitals signs and nursing note reviewed.  Constitutional:      General: She is not in acute  distress.    Appearance: She is well-developed.     Comments: Appears to not feel well  HENT:     Head: Normocephalic and atraumatic.  Eyes:     Conjunctiva/sclera: Conjunctivae normal.  Neck:     Musculoskeletal: Normal range of motion and neck supple.  Cardiovascular:     Rate and Rhythm: Normal rate and regular rhythm.     Heart sounds: Normal heart sounds. No murmur. No friction rub. No gallop. No S3 or S4 sounds.   Pulmonary:     Effort: Pulmonary effort is normal. No respiratory distress.     Breath sounds: Normal breath sounds.  Chest:     Comments: Nontender to chest wall Abdominal:     Palpations: Abdomen is soft.     Tenderness: There is no abdominal tenderness.  Musculoskeletal:     Right lower leg: She exhibits no tenderness. No edema.     Left lower leg: She exhibits no tenderness. No edema.  Skin:    General: Skin is warm and dry.     Findings: No rash.  Neurological:     Mental Status: She is alert.  Psychiatric:        Mood and Affect: Mood normal.      UC Treatments / Results  Labs (all labs ordered are listed, but only abnormal results are displayed) Labs Reviewed - No data to display  EKG None  Radiology Dg Chest 2 View  Result Date: 09/24/2018 CLINICAL DATA:  Chest pain EXAM: CHEST - 2 VIEW COMPARISON:  10/07/2016 FINDINGS: Lungs are clear.  No pleural effusion or pneumothorax. The heart is normal in size. Visualized osseous structures are within normal limits. IMPRESSION: Normal chest radiographs. Electronically Signed   By: Julian Hy M.D.   On: 09/24/2018 17:05    Procedures Procedures (including critical care time)  Medications Ordered in UC Medications - No data to display  Initial Impression / Assessment and Plan / UC Course  I have reviewed the triage vital signs and the nursing notes.  Pertinent labs & imaging results that were available during my care of the patient were reviewed by me and considered in my medical decision  making (see chart for details).     Patient is a 49 year old female with past medical history of hyperlipidemia, high blood pressure.  She has had dull aching left-sided chest pain radiating into  her back and down her left arm for the past 2 days. The pain is not reproducible and is not worsened by taking a deep breath or moving.  She has not had any recent URI symptoms Her EKG showed normal sinus rhythm with LVHand  prolonged QT X-ray was normal Vital signs here are mostly stable with blood pressure 148/102.  She had a low-grade fever here. I am unable to find a reason other than cardiac for her chest pain so I am sending her to the ER for further evaluation with cardiac enzymes and possible cardiology consult Patient understanding and agreeable to plan  Final Clinical Impressions(s) / UC Diagnoses   Final diagnoses:  Chest pain, unspecified type     Discharge Instructions     I would like for you to go to the ER for further evaluation of your chest pain Your chest x-ray was normal EKG was inconclusive I have no other explanation for your chest pain. They need to do some blood work and possible cardiology consult.      ED Prescriptions    None     Controlled Substance Prescriptions [place] Controlled Substance Registry consulted? Not Applicable   Orvan July, NP 09/24/18 1717

## 2018-09-24 NOTE — ED Triage Notes (Signed)
The patient presented to the [facility] with a complaint of chest tightness that radiates into her left shoulder blade and left arm tingling. The patient reported that she "feels bad."

## 2018-09-24 NOTE — ED Triage Notes (Signed)
CP radiating to back/shoulder blade and left arm 4/10. "dull aching" Came for urgent care after cxr ruled out ok and ekg undefinitive. States started few days ago, no N/V, no sweats, chills fevers noted. Hx: HTN and hypoglycemia

## 2019-01-15 ENCOUNTER — Other Ambulatory Visit: Payer: Self-pay

## 2019-01-15 ENCOUNTER — Encounter (HOSPITAL_COMMUNITY): Payer: Self-pay | Admitting: *Deleted

## 2019-01-15 ENCOUNTER — Inpatient Hospital Stay (HOSPITAL_COMMUNITY)
Admission: EM | Admit: 2019-01-15 | Discharge: 2019-01-20 | DRG: 177 | Disposition: A | Discharge status: Home / Self Care | Payer: BC Managed Care – PPO | Attending: Internal Medicine | Admitting: Internal Medicine

## 2019-01-15 DIAGNOSIS — U071 COVID-19: Secondary | ICD-10-CM | POA: Diagnosis not present

## 2019-01-15 DIAGNOSIS — S24153A Other incomplete lesion at T7-T10 level of thoracic spinal cord, initial encounter: Secondary | ICD-10-CM | POA: Diagnosis present

## 2019-01-15 DIAGNOSIS — G0489 Other myelitis: Secondary | ICD-10-CM | POA: Diagnosis present

## 2019-01-15 DIAGNOSIS — J1282 Pneumonia due to coronavirus disease 2019: Secondary | ICD-10-CM | POA: Diagnosis present

## 2019-01-15 DIAGNOSIS — R29898 Other symptoms and signs involving the musculoskeletal system: Secondary | ICD-10-CM | POA: Diagnosis present

## 2019-01-15 DIAGNOSIS — E669 Obesity, unspecified: Secondary | ICD-10-CM | POA: Diagnosis present

## 2019-01-15 DIAGNOSIS — J1289 Other viral pneumonia: Secondary | ICD-10-CM | POA: Diagnosis present

## 2019-01-15 DIAGNOSIS — M545 Low back pain, unspecified: Secondary | ICD-10-CM

## 2019-01-15 DIAGNOSIS — Z6834 Body mass index (BMI) 34.0-34.9, adult: Secondary | ICD-10-CM

## 2019-01-15 DIAGNOSIS — R202 Paresthesia of skin: Secondary | ICD-10-CM

## 2019-01-15 DIAGNOSIS — M5441 Lumbago with sciatica, right side: Secondary | ICD-10-CM | POA: Diagnosis present

## 2019-01-15 DIAGNOSIS — I10 Essential (primary) hypertension: Secondary | ICD-10-CM | POA: Diagnosis present

## 2019-01-15 MED ORDER — OXYCODONE-ACETAMINOPHEN 5-325 MG PO TABS
1.0000 | ORAL_TABLET | ORAL | Status: AC | PRN
  Administered 2019-01-15 – 2019-01-17 (×2): 1 via ORAL
  Filled 2019-01-15 (×2): qty 1

## 2019-01-15 NOTE — ED Notes (Signed)
Pt requested pain meds at triage. Has hx of allergy to tylox, states she has taken percocet in past with no side effects.

## 2019-01-15 NOTE — ED Triage Notes (Signed)
Pt reports onset today of mid back pain that radiates to right side and down right leg. Has numbness to bilateral feet. Denies injury.

## 2019-01-16 ENCOUNTER — Emergency Department (HOSPITAL_COMMUNITY): Payer: BC Managed Care – PPO

## 2019-01-16 ENCOUNTER — Inpatient Hospital Stay (HOSPITAL_COMMUNITY): Payer: BC Managed Care – PPO

## 2019-01-16 DIAGNOSIS — G373 Acute transverse myelitis in demyelinating disease of central nervous system: Secondary | ICD-10-CM

## 2019-01-16 DIAGNOSIS — S24153A Other incomplete lesion at T7-T10 level of thoracic spinal cord, initial encounter: Secondary | ICD-10-CM | POA: Diagnosis present

## 2019-01-16 DIAGNOSIS — Z6834 Body mass index (BMI) 34.0-34.9, adult: Secondary | ICD-10-CM | POA: Diagnosis not present

## 2019-01-16 DIAGNOSIS — R202 Paresthesia of skin: Secondary | ICD-10-CM

## 2019-01-16 DIAGNOSIS — R29898 Other symptoms and signs involving the musculoskeletal system: Secondary | ICD-10-CM | POA: Diagnosis present

## 2019-01-16 DIAGNOSIS — I10 Essential (primary) hypertension: Secondary | ICD-10-CM | POA: Diagnosis present

## 2019-01-16 DIAGNOSIS — J1289 Other viral pneumonia: Secondary | ICD-10-CM | POA: Diagnosis not present

## 2019-01-16 DIAGNOSIS — E669 Obesity, unspecified: Secondary | ICD-10-CM | POA: Diagnosis present

## 2019-01-16 DIAGNOSIS — U071 COVID-19: Secondary | ICD-10-CM | POA: Diagnosis present

## 2019-01-16 DIAGNOSIS — M5441 Lumbago with sciatica, right side: Secondary | ICD-10-CM | POA: Diagnosis present

## 2019-01-16 DIAGNOSIS — G0489 Other myelitis: Secondary | ICD-10-CM | POA: Diagnosis present

## 2019-01-16 DIAGNOSIS — J1282 Pneumonia due to coronavirus disease 2019: Secondary | ICD-10-CM | POA: Diagnosis present

## 2019-01-16 LAB — URINE CULTURE

## 2019-01-16 LAB — CBC WITH DIFFERENTIAL/PLATELET
Abs Immature Granulocytes: 0.01 10*3/uL (ref 0.00–0.07)
Basophils Absolute: 0 10*3/uL (ref 0.0–0.1)
Basophils Relative: 0 %
Eosinophils Absolute: 0 10*3/uL (ref 0.0–0.5)
Eosinophils Relative: 0 %
HCT: 40.3 % (ref 36.0–46.0)
Hemoglobin: 13.6 g/dL (ref 12.0–15.0)
Immature Granulocytes: 0 %
Lymphocytes Relative: 49 %
Lymphs Abs: 2.6 10*3/uL (ref 0.7–4.0)
MCH: 30.4 pg (ref 26.0–34.0)
MCHC: 33.7 g/dL (ref 30.0–36.0)
MCV: 90.2 fL (ref 80.0–100.0)
Monocytes Absolute: 0.3 10*3/uL (ref 0.1–1.0)
Monocytes Relative: 6 %
Neutro Abs: 2.4 10*3/uL (ref 1.7–7.7)
Neutrophils Relative %: 45 %
Platelets: 169 10*3/uL (ref 150–400)
RBC: 4.47 MIL/uL (ref 3.87–5.11)
RDW: 13.5 % (ref 11.5–15.5)
WBC: 5.3 10*3/uL (ref 4.0–10.5)
nRBC: 0 % (ref 0.0–0.2)

## 2019-01-16 LAB — URINALYSIS, ROUTINE W REFLEX MICROSCOPIC
Bilirubin Urine: NEGATIVE
Glucose, UA: NEGATIVE mg/dL
Ketones, ur: NEGATIVE mg/dL
Nitrite: NEGATIVE
Protein, ur: 30 mg/dL — AB
Specific Gravity, Urine: 1.026 (ref 1.005–1.030)
pH: 5 (ref 5.0–8.0)

## 2019-01-16 LAB — COMPREHENSIVE METABOLIC PANEL
ALT: 39 U/L (ref 0–44)
AST: 38 U/L (ref 15–41)
Albumin: 3.7 g/dL (ref 3.5–5.0)
Alkaline Phosphatase: 68 U/L (ref 38–126)
Anion gap: 12 (ref 5–15)
BUN: 14 mg/dL (ref 6–20)
CO2: 24 mmol/L (ref 22–32)
Calcium: 8.8 mg/dL — ABNORMAL LOW (ref 8.9–10.3)
Chloride: 101 mmol/L (ref 98–111)
Creatinine, Ser: 0.85 mg/dL (ref 0.44–1.00)
GFR calc Af Amer: >60 mL/min (ref >60)
GFR calc non Af Amer: >60 mL/min (ref >60)
Glucose, Bld: 108 mg/dL — ABNORMAL HIGH (ref 70–99)
Potassium: 3.8 mmol/L (ref 3.5–5.1)
Sodium: 137 mmol/L (ref 135–145)
Total Bilirubin: 1.1 mg/dL (ref 0.3–1.2)
Total Protein: 7 g/dL (ref 6.5–8.1)

## 2019-01-16 LAB — FIBRINOGEN: Fibrinogen: 398 mg/dL (ref 210–475)

## 2019-01-16 LAB — PROCALCITONIN: Procalcitonin: <0.1 ng/mL

## 2019-01-16 LAB — SARS CORONAVIRUS 2 BY RT PCR (HOSPITAL ORDER, PERFORMED IN ~~LOC~~ HOSPITAL LAB): SARS Coronavirus 2: POSITIVE — AB

## 2019-01-16 LAB — C-REACTIVE PROTEIN: CRP: 0.8 mg/dL (ref <1.0)

## 2019-01-16 LAB — I-STAT BETA HCG BLOOD, ED (MC, WL, AP ONLY): I-stat hCG, quantitative: <5 m[IU]/mL (ref <5)

## 2019-01-16 LAB — LACTIC ACID, PLASMA: Lactic Acid, Venous: 1.1 mmol/L (ref 0.5–1.9)

## 2019-01-16 LAB — TRIGLYCERIDES: Triglycerides: 98 mg/dL (ref <150)

## 2019-01-16 LAB — FERRITIN: Ferritin: 499 ng/mL — ABNORMAL HIGH (ref 11–307)

## 2019-01-16 LAB — STREP PNEUMONIAE URINARY ANTIGEN: Strep Pneumo Urinary Antigen: NEGATIVE

## 2019-01-16 LAB — LACTATE DEHYDROGENASE: LDH: 244 U/L — ABNORMAL HIGH (ref 98–192)

## 2019-01-16 LAB — D-DIMER, QUANTITATIVE: D-Dimer, Quant: 0.71 ug/mL-FEU — ABNORMAL HIGH (ref 0.00–0.50)

## 2019-01-16 IMAGING — DX PORTABLE CHEST - 1 VIEW
1 series · 1 of 1 positions shown · non-contrast
Comparison: [DATE]

CLINICAL DATA: Chest tightness

EXAM:
PORTABLE CHEST 1 VIEW

[chest ap]
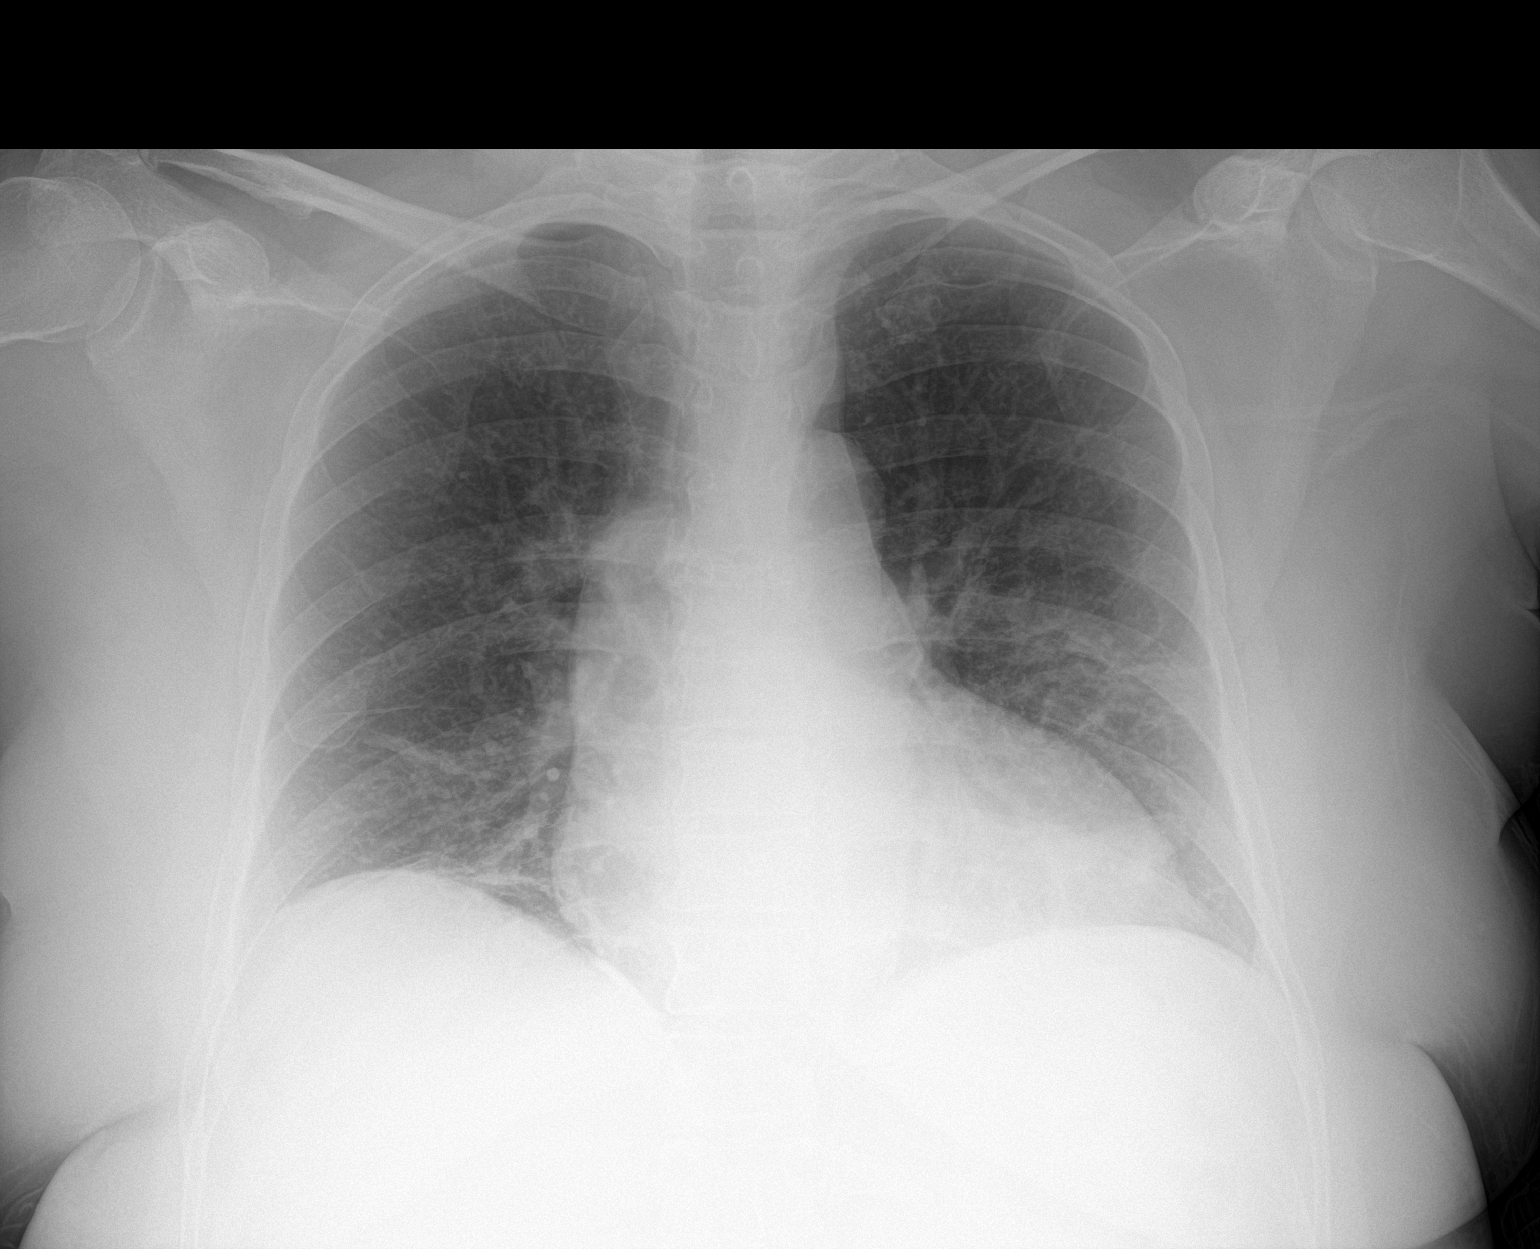

[1 of 1 positions shown; findings below may reference images not displayed]

FINDINGS: There is atelectatic change in the lung bases. There is mild
airspace opacity in the left base. The lungs elsewhere are clear.
Heart is mildly enlarged with pulmonary vascularity normal. No
adenopathy. No bone lesions.
IMPRESSION: Airspace opacity in the left base, felt to represent pneumonia.
Bibasilar atelectasis also present. Lungs elsewhere are clear.
Stable cardiac prominence. No adenopathy evident.

## 2019-01-16 IMAGING — MR MRI LUMBAR SPINE WITHOUT CONTRAST
4 of 5 series · 27 of 48 positions shown · non-contrast
Comparison: None.

CLINICAL DATA: Mid back pain radiating down the right leg

EXAM:
MRI LUMBAR SPINE WITHOUT CONTRAST
TECHNIQUE: Multiplanar, multisequence MR imaging of the lumbar spine was
performed. No intravenous contrast was administered.

[Series 5: T2 · sagittal · 4.0mm · 0.73mm/px · 6 of 14 slices shown (1 of 2)]
[im 1/14]
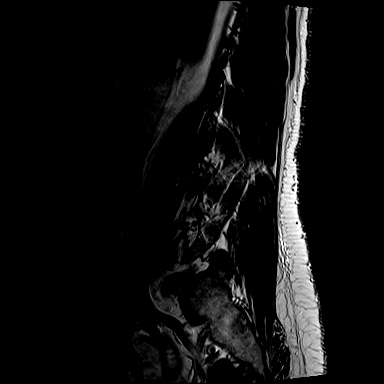
[im 3/14]
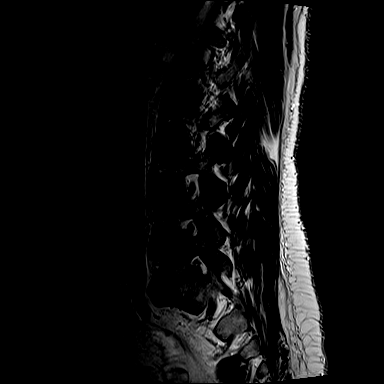
[im 6/14]
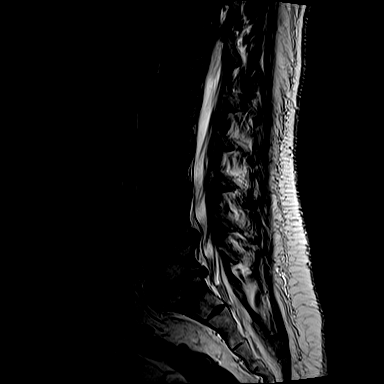
[im 8/14]
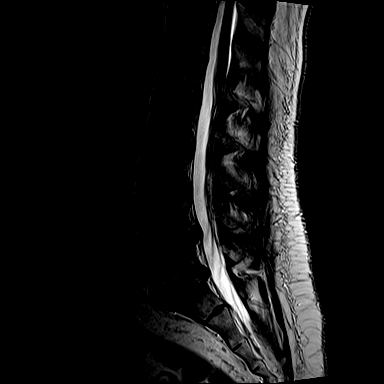
[im 11/14]
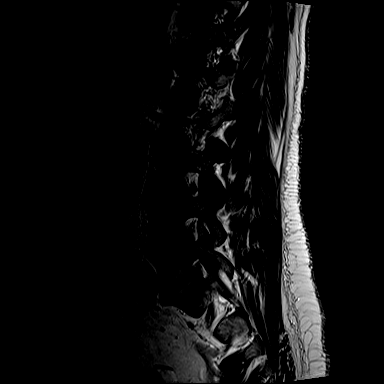
[im 14/14]
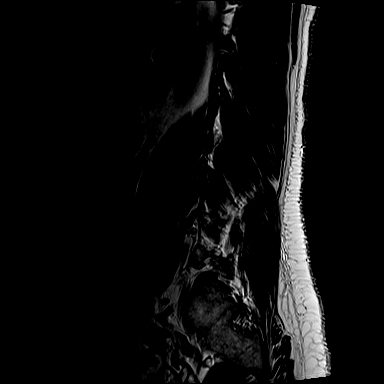

[Series 7: T1 · sagittal · 4.0mm · 0.88mm/px · 7 of 14 slices shown (1 of 2)]
[im 1/14]
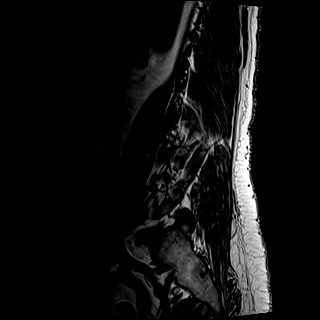
[im 3/14]
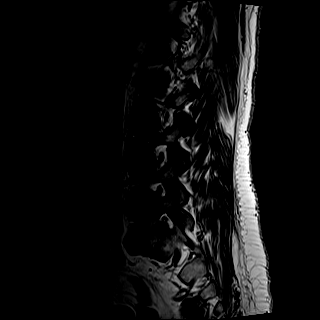
[im 5/14]
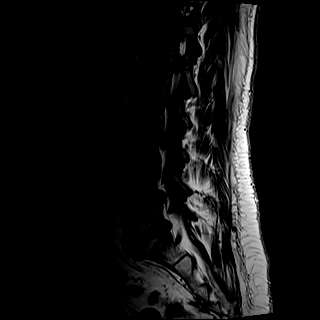
[im 7/14]
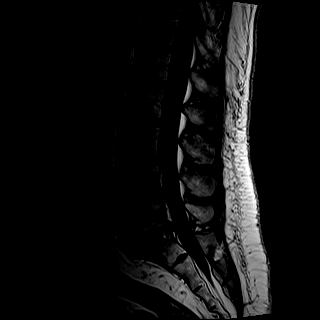
[im 9/14]
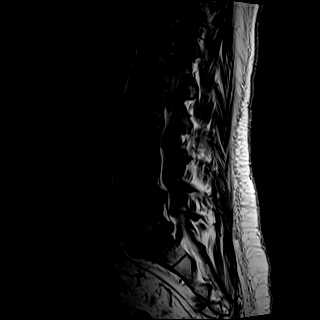
[im 11/14]
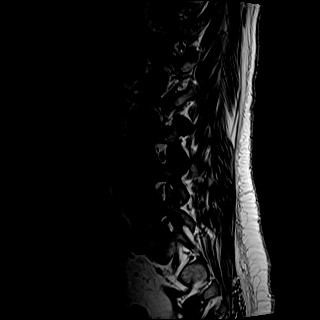
[im 14/14]
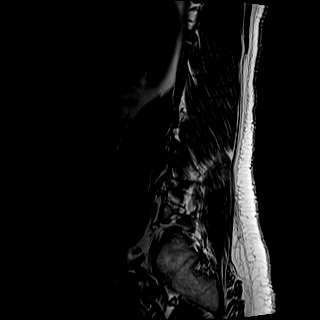

[Series 8: T2 · axial · 4.0mm · 0.57mm/px · z∈[-76,+100]mm · 8 of 30 slices shown (2 of 2)]
[im 1/30]
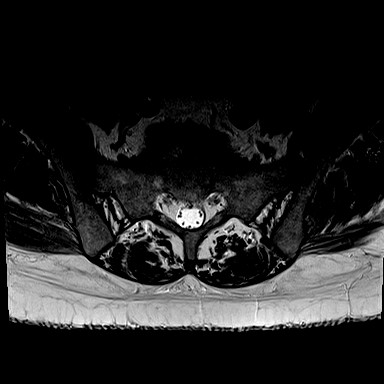
[im 5/30]
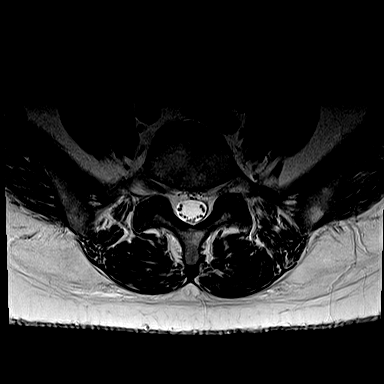
[im 9/30]
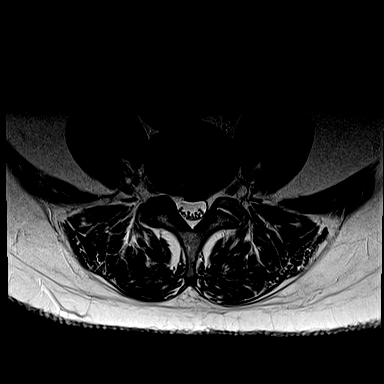
[im 14/30]
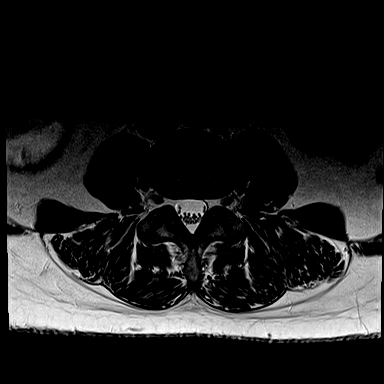
[im 16/30]
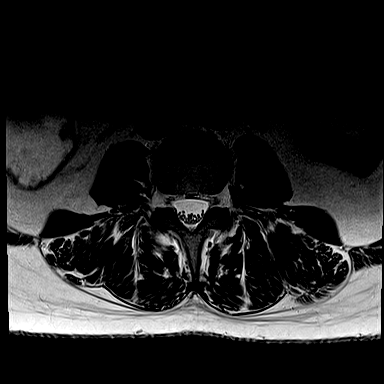
[im 21/30]
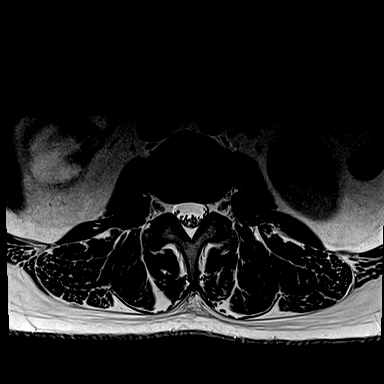
[im 25/30]
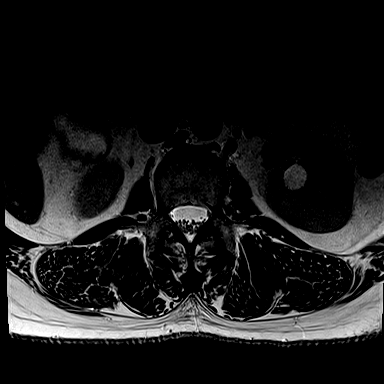
[im 30/30]
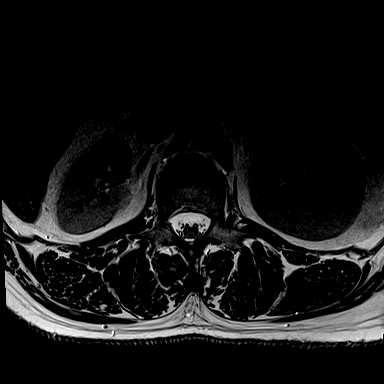

[Series 9: T1 · axial · 4.0mm · 0.34mm/px · z∈[-76,+75]mm · 6 of 30 slices shown (2 of 2)]
[im 1/30]
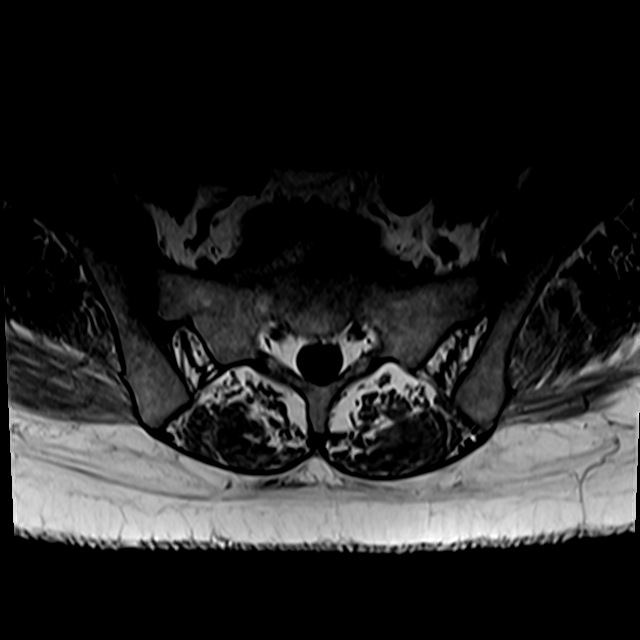
[im 5/30]
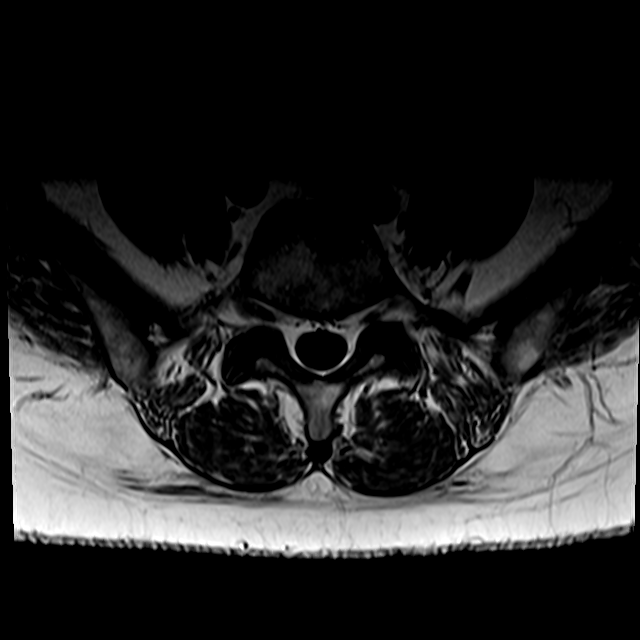
[im 9/30]
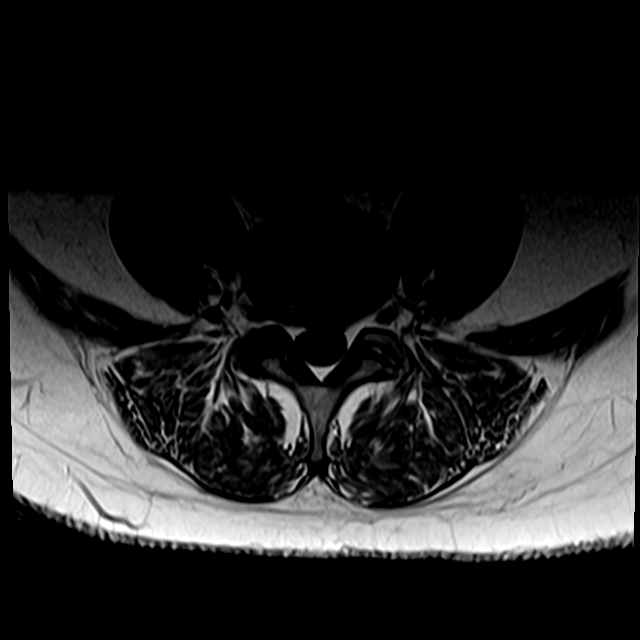
[im 14/30]
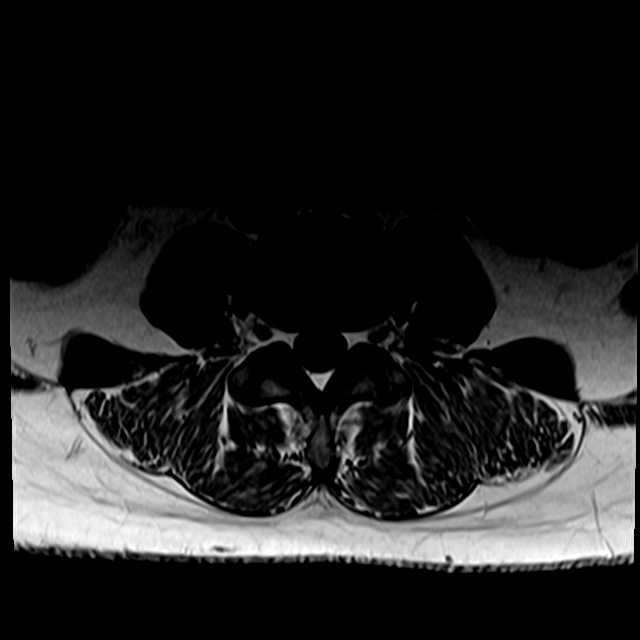
[im 16/30]
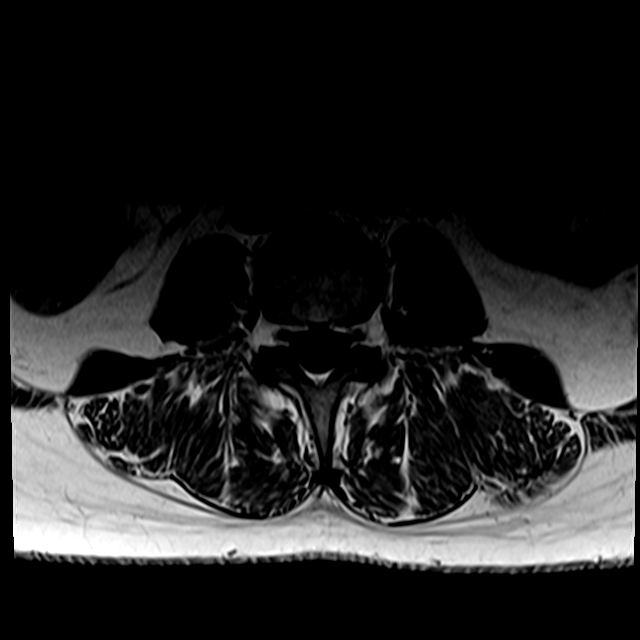
[im 25/30]
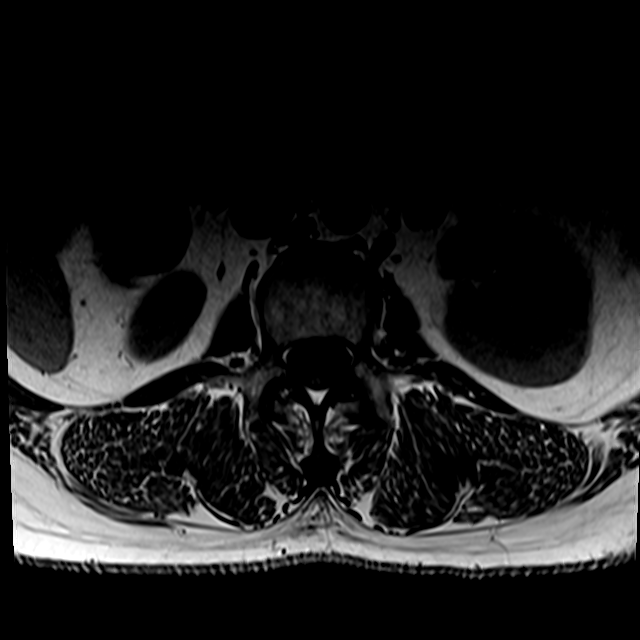

[27 of 48 positions shown; findings below may reference images not displayed]

FINDINGS: Segmentation:  5 lumbar type vertebrae based on prior KUB

Alignment:  Normal

Vertebrae:  No fracture, evidence of discitis, or bone lesion.

Conus medullaris and cauda equina: Conus extends to the L1-2 level.
Conus and cauda equina appear normal.

Paraspinal and other soft tissues: Negative

Disc levels:

L4-5: Disc narrowing and bulging with a central up turning
protrusion. No impingement.

L5-S1: Disc narrowing and bulging with endplate degeneration. No
neural compression.
IMPRESSION: L4-5 and L5-S1 moderate disc degeneration without neural compression
to explain right leg symptoms.

## 2019-01-16 IMAGING — MR MRI HEAD WITHOUT AND WITH CONTRAST
26 of 30 series · 30 of 48 positions shown · IV contrast (gadavist)
Comparison: MRI thoracic spine earlier today. MRI cervical spine
reported separately.

CLINICAL DATA: Spinal cord abnormality on thoracic MRI, T10 level,
possible demyelinating lesion. Assess for disease elsewhere in CNS.
Symptoms are low back pain and leg weakness.

EXAM:
MRI HEAD WITHOUT AND WITH CONTRAST
TECHNIQUE: Multiplanar, multiecho pulse sequences of the brain and surrounding
structures were obtained without and with intravenous contrast.
CONTRAST:  Gadavist 10 mL.

[Series 5: ax dwi_tracew · axial · 3.0mm · 1.44mm/px · z∈[-67,+94]mm · 2 of 92 slices shown]
[im 1/92]
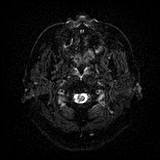
[im 92/92]
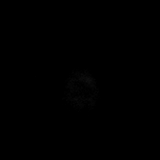

[Series 6: ax dwi_adc · axial · 3.0mm · 1.44mm/px · 1 of 46 slices shown]
[im 1/46]
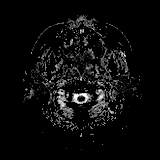

[Series 7: DWI · coronal · 4.0mm · 0.88mm/px · 1 of 64 slices shown (1 of 2)]
[im 1/64]
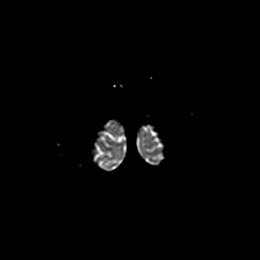

[Series 8: DWI · coronal · 4.0mm · 0.88mm/px · 1 of 32 slices shown (2 of 2)]
[im 1/32]
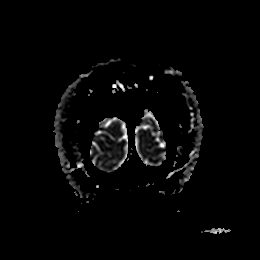

[Series 9: T1 · sagittal · 5.0mm · 0.72mm/px · 1 of 23 slices shown (1 of 4)]
[im 1/23]
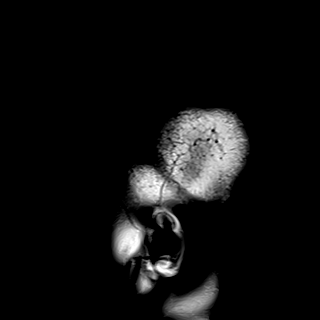

[Series 10: T2 · axial · 4.0mm · 0.72mm/px · 1 of 32 slices shown (1 of 4)]
[im 1/32]
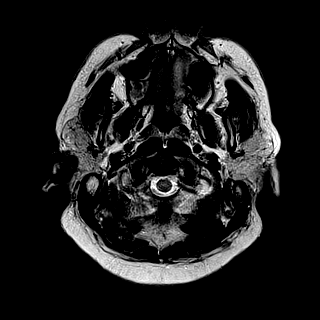

[Series 11: FLAIR · axial · 3.0mm · 0.45mm/px · 1 of 44 slices shown]
[im 1/44]
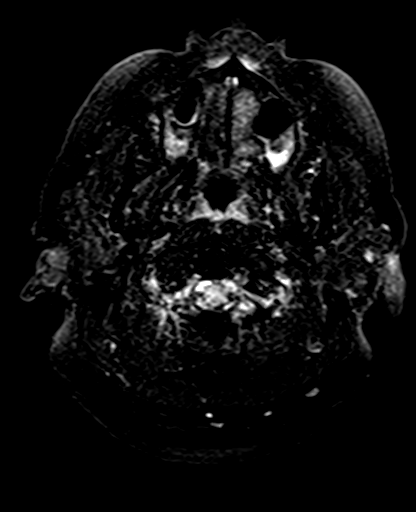

[Series 12: mag_images · axial · 3.0mm · 0.90mm/px · 1 of 60 slices shown]
[im 1/60]
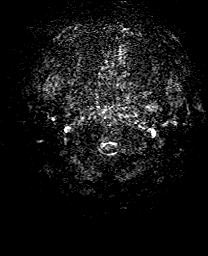

[Series 13: pha_images · axial · 3.0mm · 0.90mm/px · 1 of 59 slices shown]
[im 1/59]
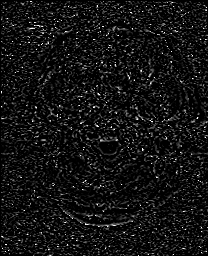

[Series 14: swi_images · axial · 3.0mm · 0.90mm/px · z∈[-78,+98]mm · 2 of 60 slices shown]
[im 1/60]
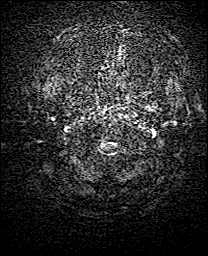
[im 60/60]
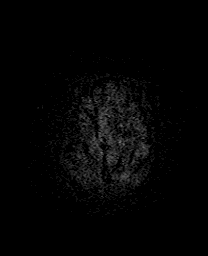

[Series 15: mip_images(sw) · axial · 24.0mm · 0.90mm/px · 1 of 53 slices shown]
[im 1/53]
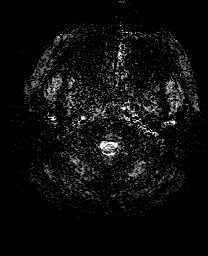

[Series 20: t2_space_dark-fluid_sag_p2_ns-ir · sagittal · 1.0mm · 0.49mm/px · 3 of 144 slices shown]
[im 1/144]
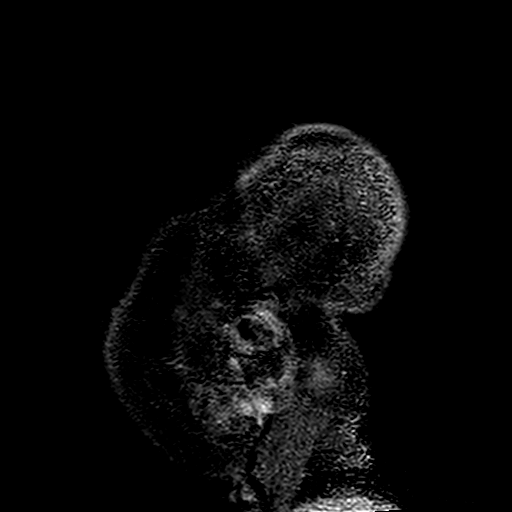
[im 48/144]
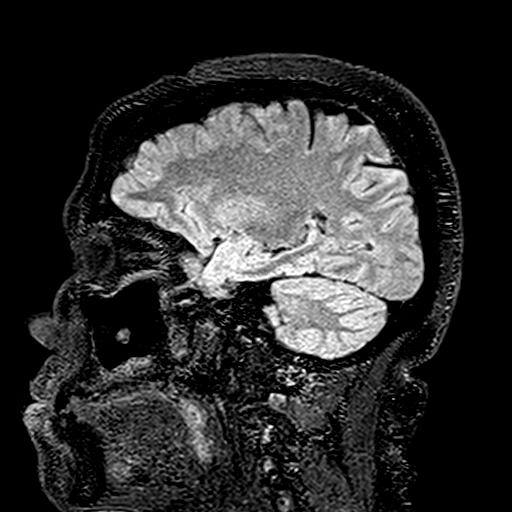
[im 96/144]
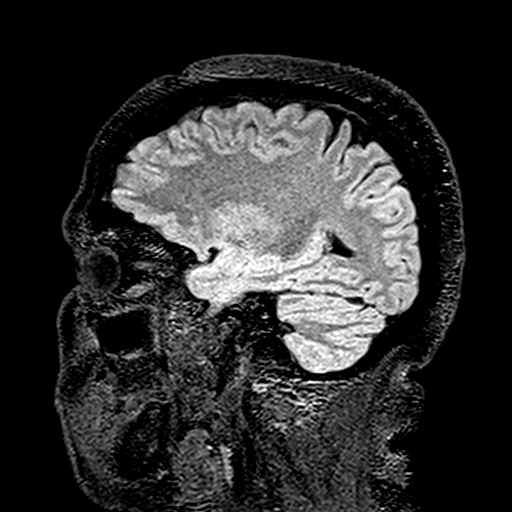

[Series 24: T2 · coronal · 5.0mm · 0.34mm/px · 1 of 29 slices shown (2 of 4)]
[im 1/29]
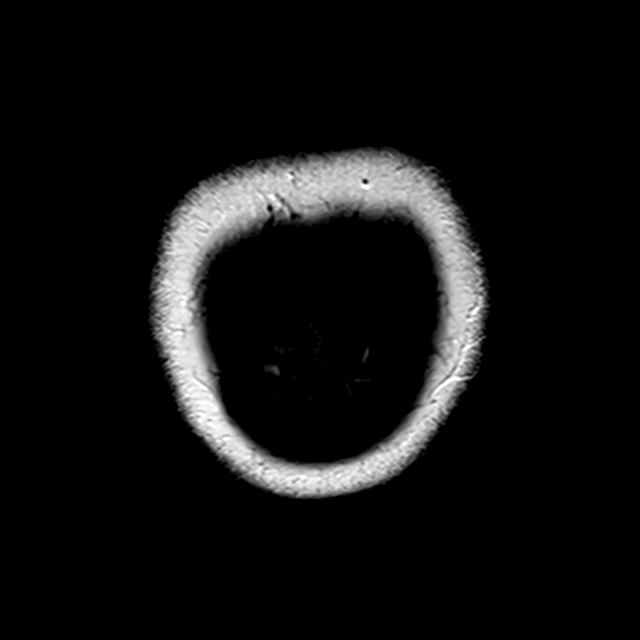

[Series 32: T2 · sagittal · 3.0mm · 0.69mm/px · 1 of 15 slices shown (3 of 4)]
[im 1/15]
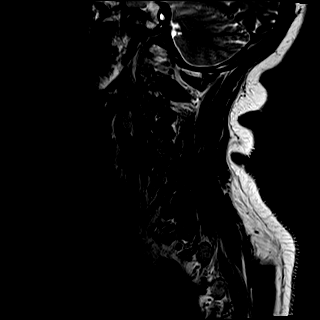

[Series 33: T1 · sagittal · 3.0mm · 0.69mm/px · 1 of 15 slices shown (2 of 4)]
[im 1/15]
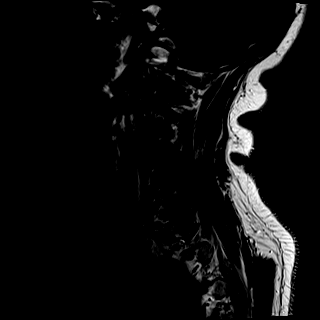

[Series 34: STIR · sagittal · 3.0mm · 0.86mm/px · 1 of 15 slices shown]
[im 1/15]
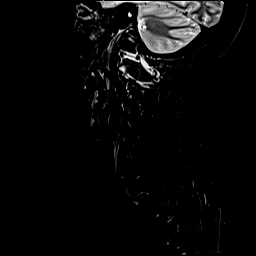

[Series 35: T2 · axial · 3.0mm · 0.66mm/px · 1 of 40 slices shown (4 of 4)]
[im 1/40]
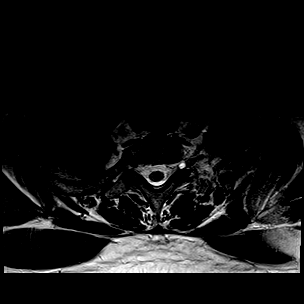

[Series 36: GRE · axial · 3.0mm · 0.39mm/px · 1 of 40 slices shown]
[im 1/40]
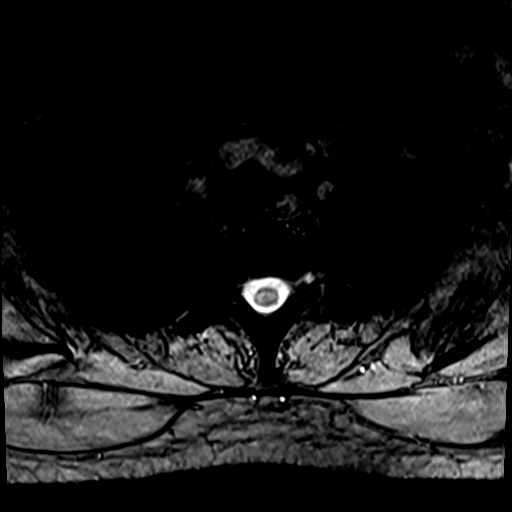

[Series 37: T1 · axial · 3.0mm · 0.39mm/px · 1 of 40 slices shown (3 of 4)]
[im 1/40]
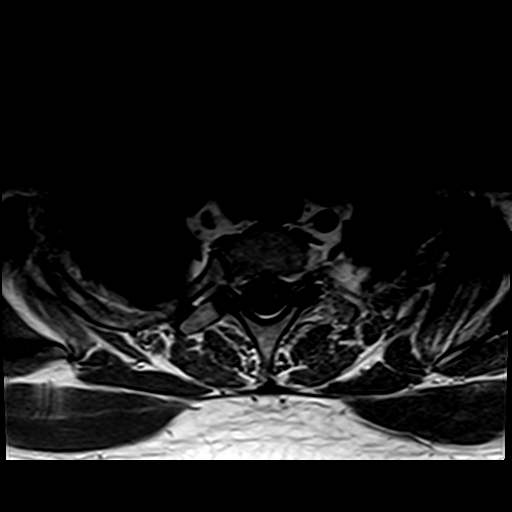

[Series 53: T1 · axial · non-contrast · 4.0mm · 0.31mm/px · 1 of 50 slices shown (4 of 4)]
[im 1/50]
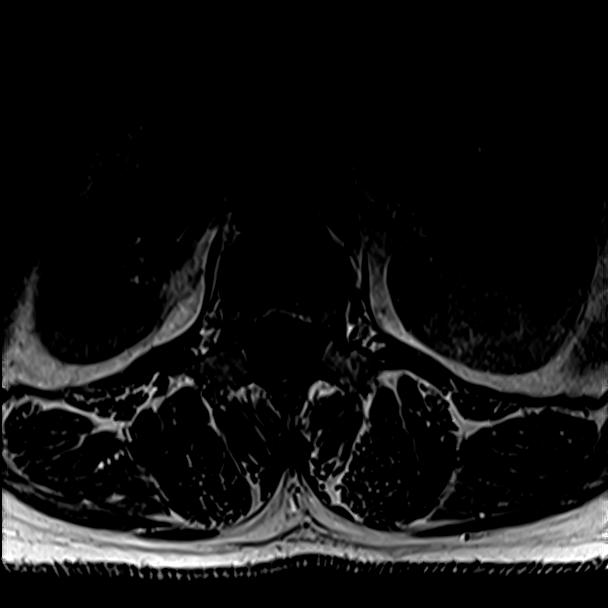

[Series 69: T1 fat-sat post-contrast · sagittal · 3.0mm · 0.76mm/px · 1 of 17 slices shown (1 of 2)]
[im 1/17]
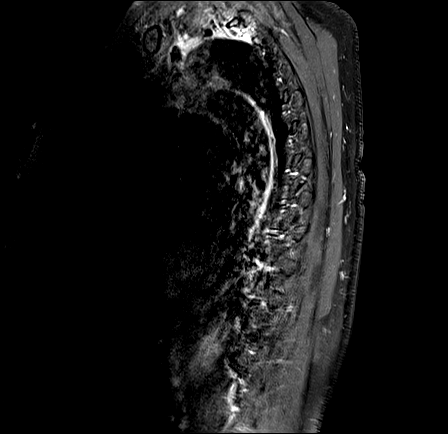

[Series 70: T1 post-contrast · axial · 4.0mm · 0.31mm/px · 1 of 50 slices shown (1 of 4)]
[im 1/50]
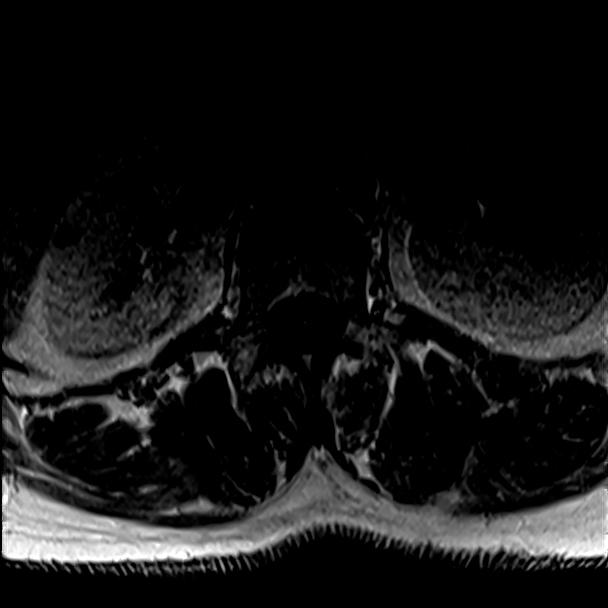

[Series 71: T1 fat-sat post-contrast · sagittal · 3.0mm · 0.43mm/px · 1 of 15 slices shown (2 of 2)]
[im 1/15]
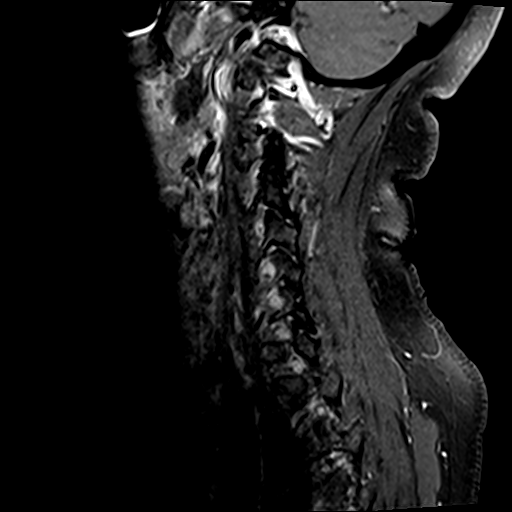

[Series 72: T1 post-contrast · axial · 3.0mm · 0.39mm/px · 1 of 40 slices shown (2 of 4)]
[im 1/40]
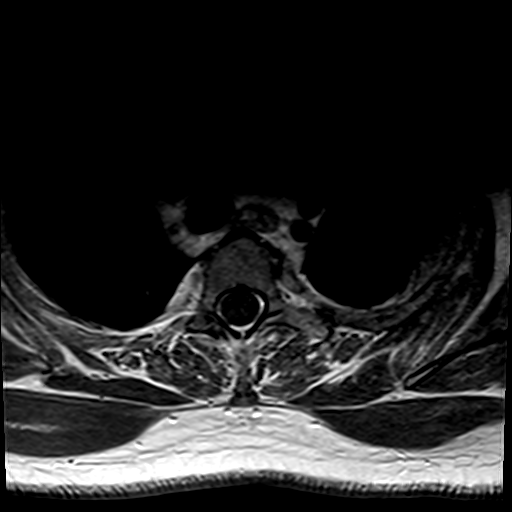

[Series 78: T1 post-contrast · coronal · 5.0mm · 0.34mm/px · 1 of 28 slices shown (3 of 4)]
[im 1/28]
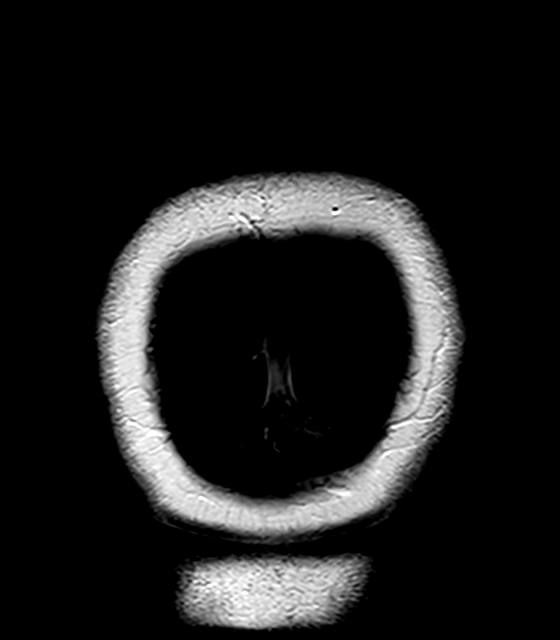

[Series 79: T1 post-contrast · sagittal · 5.0mm · 0.72mm/px · 1 of 23 slices shown (4 of 4)]
[im 1/23]
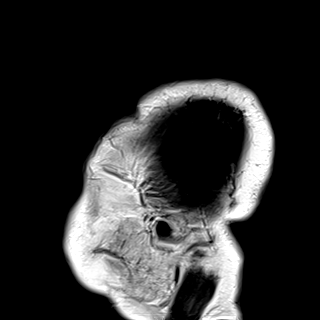

[30 of 48 positions shown; findings below may reference images not displayed]

FINDINGS: The patient was unable to remain motionless for the exam. Small or
subtle lesions could be overlooked.

Brain: No evidence for acute infarction, hemorrhage, mass lesion,
hydrocephalus, or extra-axial fluid. Normal for age cerebral volume.
No white matter disease. Specifically no demyelinating lesions.

Post infusion, no abnormal enhancement of the brain or meninges.

Vascular: Flow voids are maintained.

Skull and upper cervical spine: Normal marrow signal. LEFT frontal
subgaleal scalp lesion consistent with lipoma.

Sinuses/Orbits: Negative.

Other: None.
IMPRESSION: Motion degraded exam demonstrates no acute or focal intracranial
abnormality.

Specifically, no evidence for active or occult demyelinating lesion
in the cerebral, cerebellar or brainstem white matter to suggest
multiple sclerosis, CNS infection, or other significant abnormality.

## 2019-01-16 IMAGING — MR MRI THORACIC SPINE WITHOUT CONTRAST
5 of 6 series · 24 of 48 positions shown · non-contrast
Comparison: Chest x-ray [DATE]

CLINICAL DATA: Mid back pain radiating down to the right leg.
Numbness in the bilateral feet

EXAM:
MRI THORACIC SPINE WITHOUT CONTRAST
TECHNIQUE: Multiplanar, multisequence MR imaging of the thoracic spine was
performed. No intravenous contrast was administered.

[Series 14: T1 · sagittal · 6.0mm · 1.23mm/px · 2 of 9 slices shown (1 of 2)]
[im 1/9]
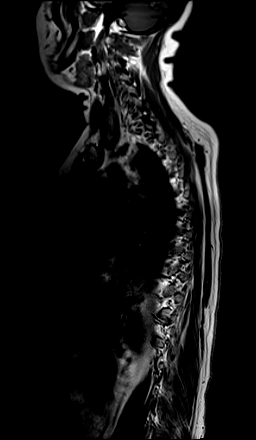
[im 9/9]
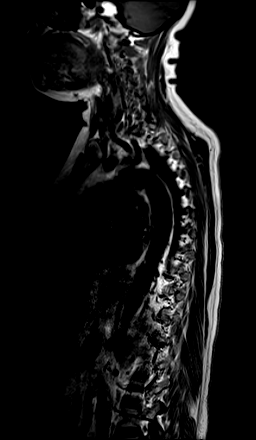

[Series 15: T2 · sagittal · 3.0mm · 0.76mm/px · 6 of 17 slices shown (1 of 2)]
[im 1/17]
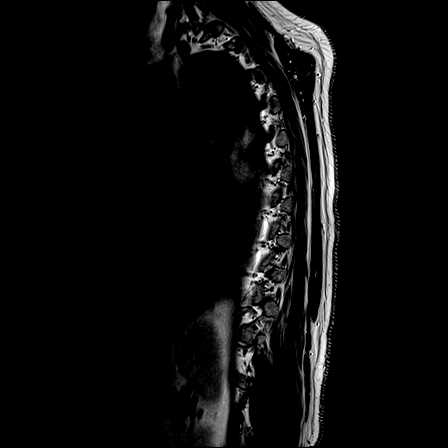
[im 4/17]
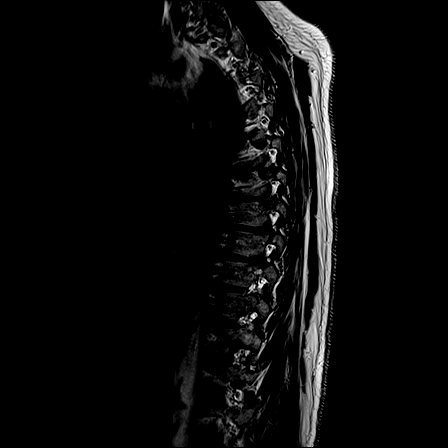
[im 7/17]
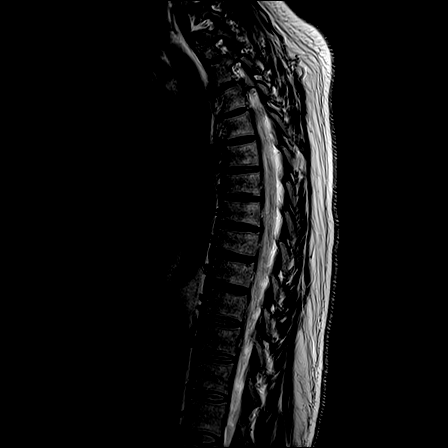
[im 10/17]
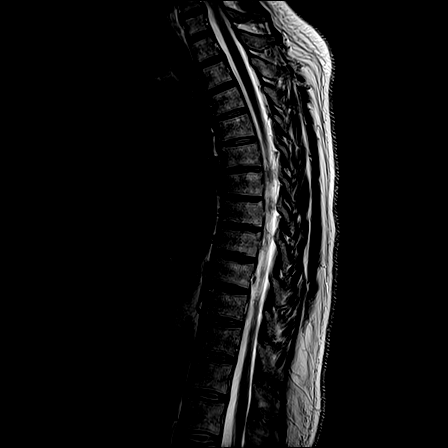
[im 13/17]
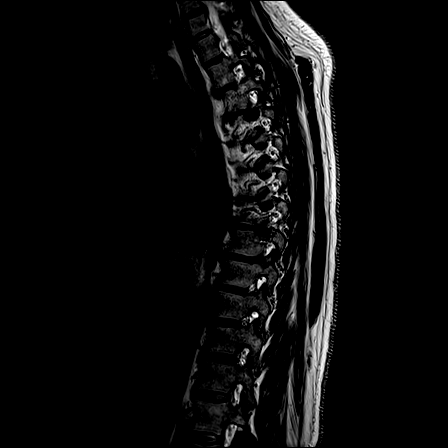
[im 17/17]
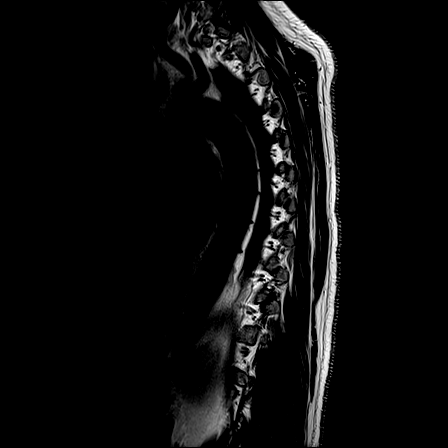

[Series 16: T1 · sagittal · 3.0mm · 0.76mm/px · 6 of 17 slices shown (2 of 2)]
[im 1/17]
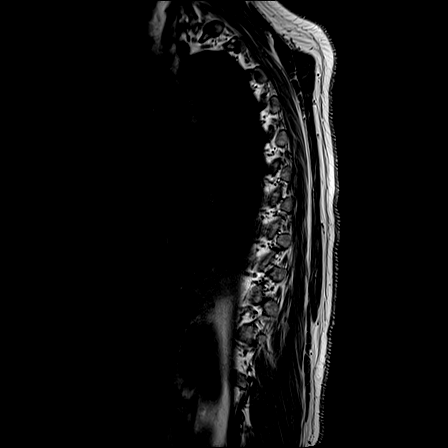
[im 4/17]
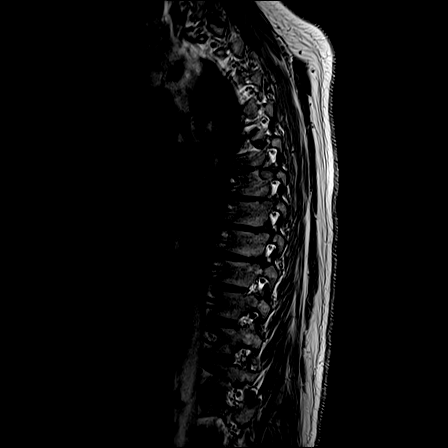
[im 7/17]
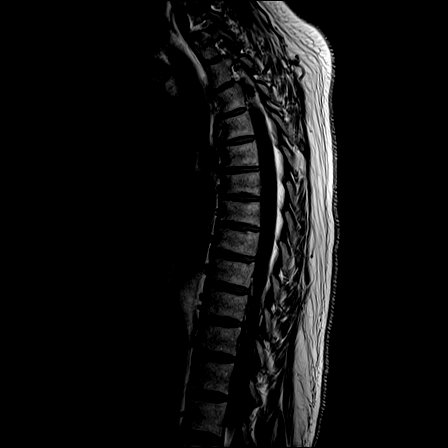
[im 10/17]
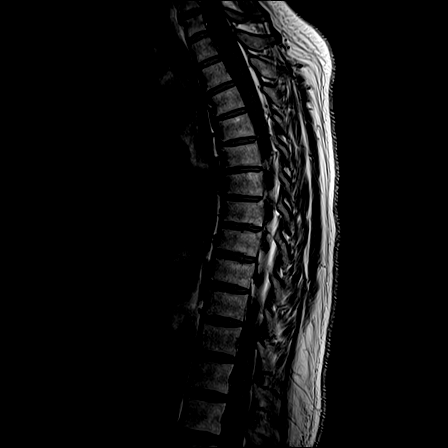
[im 13/17]
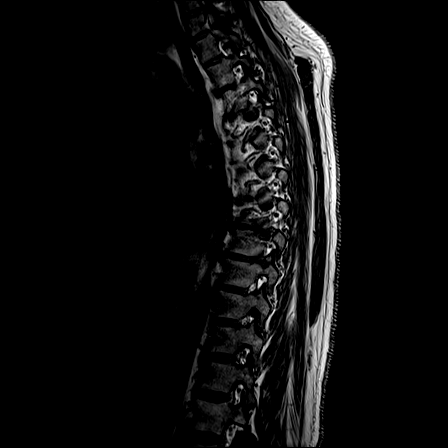
[im 17/17]
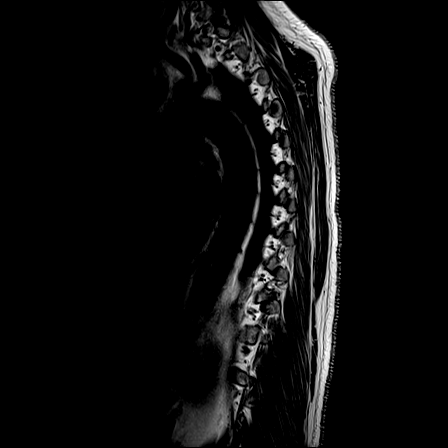

[Series 17: STIR · sagittal · 3.0mm · 0.38mm/px · 2 of 17 slices shown]
[im 1/17]
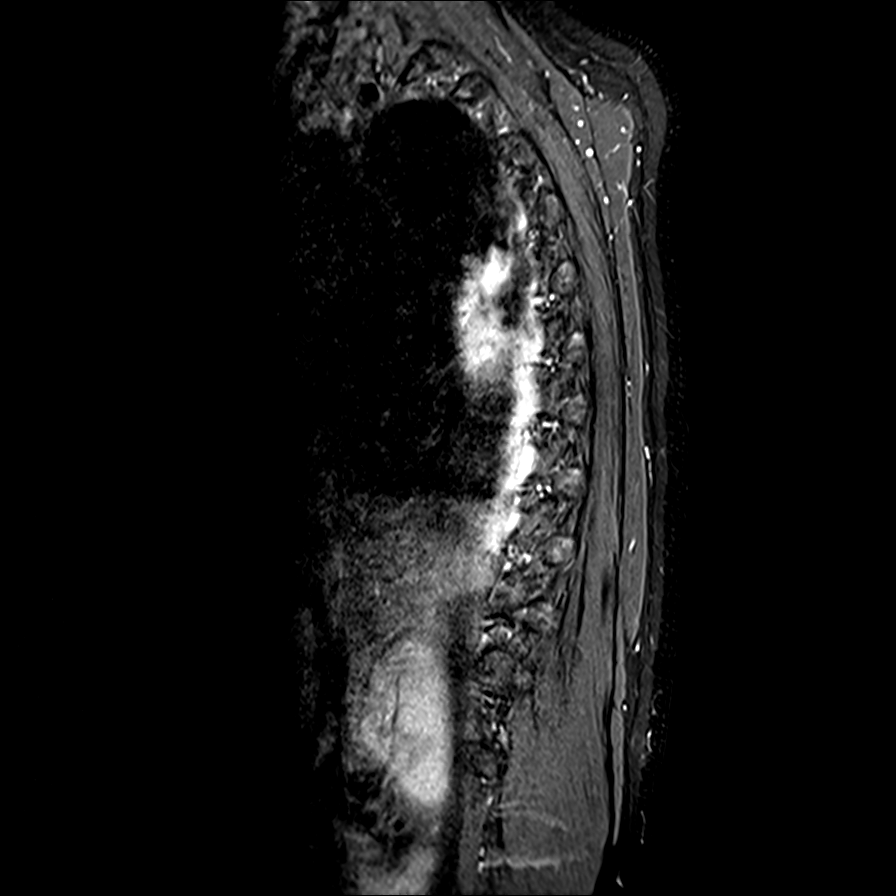
[im 4/17]
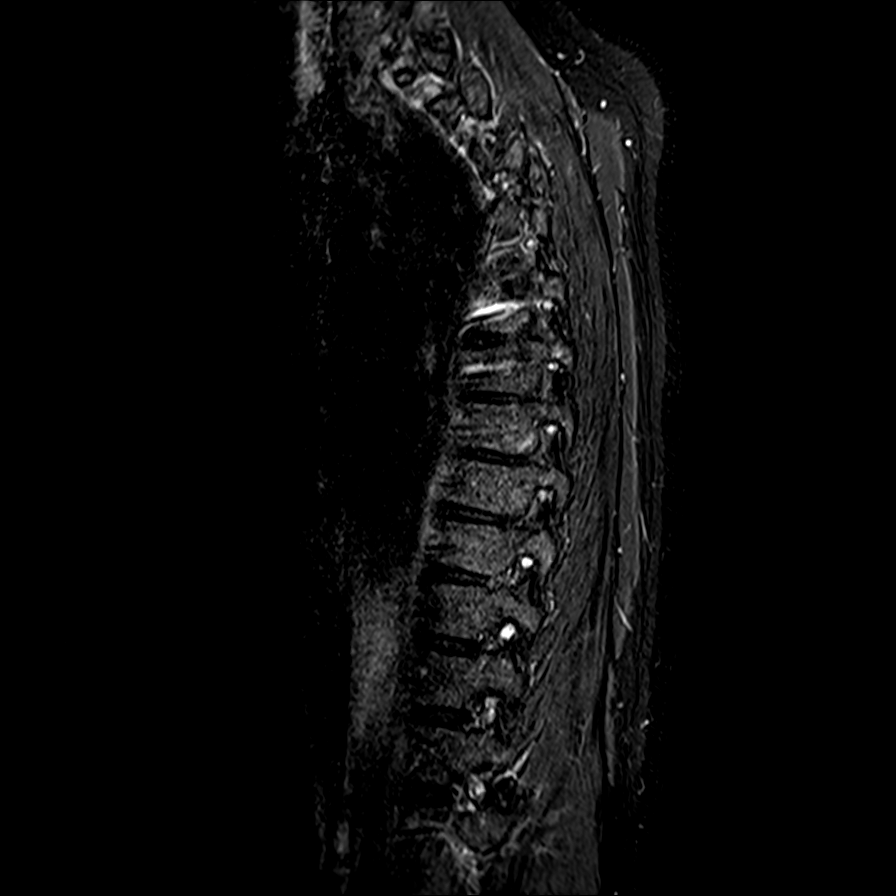

[Series 18: T2 · axial · 4.0mm · 0.59mm/px · z∈[-220,+25]mm · 8 of 39 slices shown (2 of 2)]
[im 1/39]
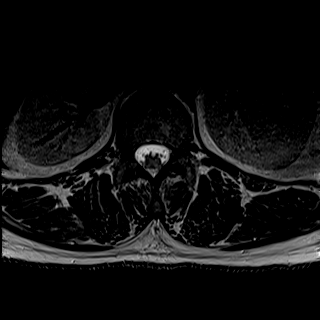
[im 6/39]
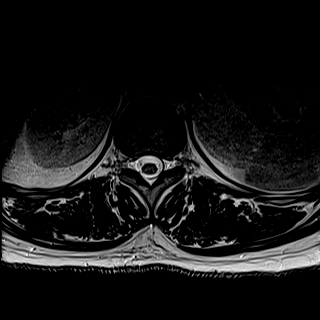
[im 12/39]
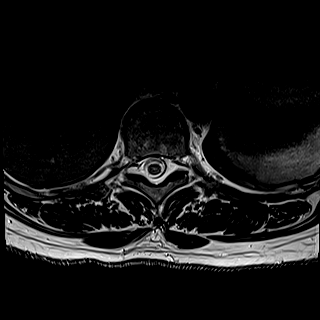
[im 18/39]
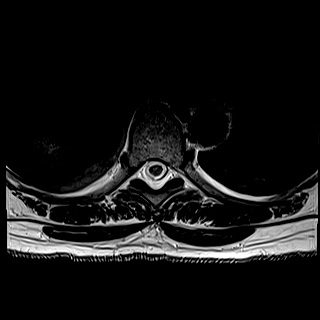
[im 21/39]
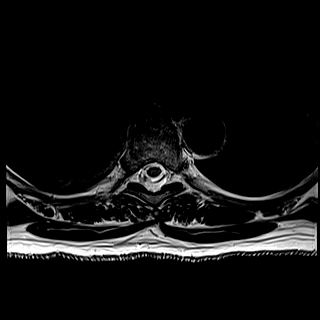
[im 27/39]
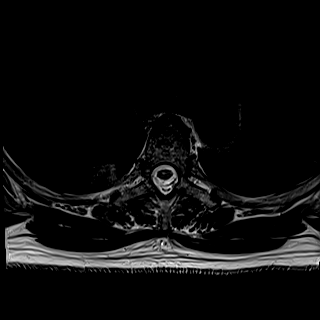
[im 33/39]
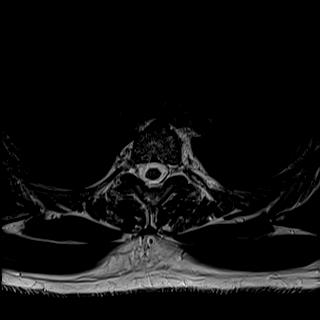
[im 39/39]
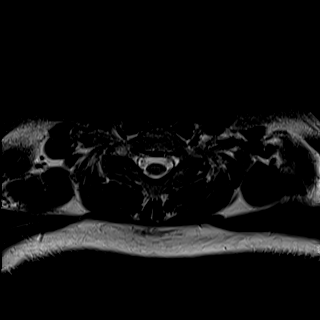

[24 of 48 positions shown; findings below may reference images not displayed]

FINDINGS: Alignment:  Negative for listhesis.

Vertebrae: No fracture, evidence of discitis, or bone lesion.

Cord: Best seen on sagittal STIR, but also seen on sagittal T2
weighted imaging there is a short segment hyperintensity in the
right cord at T10. This area is not seen on axial slices due to
slice selection. No cord expansion or volume loss.

Paraspinal and other soft tissues: There is patchy bilateral lung
opacity, most likely pneumonia-possibly atypical.

Disc levels:

Midthoracic disc narrowing with small protrusions at T6-7 to T8-9.

These results were called by telephone at the time of interpretation
on [DATE] at [DATE] to Dr. KLPIGBB , who verbally
acknowledged these results.
IMPRESSION: 1. Short segment right hemi cord signal abnormality at T10 which
would usually reflect a demyelinating process. Suggest brain MR and
postcontrast imaging.
2. Patchy bilateral lung opacity compatible with pneumonia. [N1]
should be considered specifically.

## 2019-01-16 MED ORDER — VITAMIN C 500 MG PO TABS
500.0000 mg | ORAL_TABLET | Freq: Every day | ORAL | Status: DC
  Administered 2019-01-16 – 2019-01-20 (×5): 500 mg via ORAL
  Filled 2019-01-16 (×5): qty 1

## 2019-01-16 MED ORDER — ZINC SULFATE 220 (50 ZN) MG PO CAPS
220.0000 mg | ORAL_CAPSULE | Freq: Every day | ORAL | Status: DC
  Administered 2019-01-16 – 2019-01-20 (×5): 220 mg via ORAL
  Filled 2019-01-16 (×6): qty 1

## 2019-01-16 MED ORDER — GADOBUTROL 1 MMOL/ML IV SOLN
10.0000 mL | Freq: Once | INTRAVENOUS | Status: AC | PRN
  Administered 2019-01-16: 10 mL via INTRAVENOUS

## 2019-01-16 MED ORDER — SODIUM CHLORIDE 0.9 % IV SOLN
500.0000 mg | Freq: Once | INTRAVENOUS | Status: AC
  Administered 2019-01-16: 500 mg via INTRAVENOUS
  Filled 2019-01-16 (×2): qty 500

## 2019-01-16 MED ORDER — OLMESARTAN-AMLODIPINE-HCTZ 40-5-25 MG PO TABS: 1.0000 | ORAL_TABLET | Freq: Every day | ORAL | Status: DC

## 2019-01-16 MED ORDER — HYDROCHLOROTHIAZIDE 25 MG PO TABS
25.0000 mg | ORAL_TABLET | Freq: Every day | ORAL | Status: DC
  Administered 2019-01-16 – 2019-01-20 (×5): 25 mg via ORAL
  Filled 2019-01-16 (×6): qty 1

## 2019-01-16 MED ORDER — AZITHROMYCIN 250 MG PO TABS: 250.0000 mg | ORAL_TABLET | Freq: Every day | ORAL | Status: DC

## 2019-01-16 MED ORDER — GUAIFENESIN-DM 100-10 MG/5ML PO SYRP
10.0000 mL | ORAL_SOLUTION | ORAL | Status: DC | PRN
  Administered 2019-01-17 – 2019-01-19 (×3): 10 mL via ORAL
  Filled 2019-01-16 (×3): qty 10

## 2019-01-16 MED ORDER — ENOXAPARIN SODIUM 40 MG/0.4ML ~~LOC~~ SOLN: 40.0000 mg | SUBCUTANEOUS | Status: DC

## 2019-01-16 MED ORDER — SODIUM CHLORIDE 0.9 % IV SOLN
1.0000 g | INTRAVENOUS | Status: DC
  Filled 2019-01-16: qty 10

## 2019-01-16 MED ORDER — HYDROCOD POLST-CPM POLST ER 10-8 MG/5ML PO SUER
5.0000 mL | Freq: Two times a day (BID) | ORAL | Status: DC | PRN
  Administered 2019-01-18 – 2019-01-19 (×2): 5 mL via ORAL
  Filled 2019-01-16 (×2): qty 5

## 2019-01-16 MED ORDER — LORAZEPAM 1 MG PO TABS
1.0000 mg | ORAL_TABLET | Freq: Once | ORAL | Status: AC | PRN
  Administered 2019-01-16: 1 mg via ORAL
  Filled 2019-01-16: qty 1

## 2019-01-16 MED ORDER — SODIUM CHLORIDE 0.9% FLUSH
3.0000 mL | Freq: Two times a day (BID) | INTRAVENOUS | Status: DC
  Administered 2019-01-16 – 2019-01-20 (×8): 3 mL via INTRAVENOUS

## 2019-01-16 MED ORDER — IRBESARTAN 300 MG PO TABS
300.0000 mg | ORAL_TABLET | Freq: Every day | ORAL | Status: DC
  Administered 2019-01-16 – 2019-01-20 (×5): 300 mg via ORAL
  Filled 2019-01-16 (×6): qty 1

## 2019-01-16 MED ORDER — SODIUM CHLORIDE 0.9% FLUSH: 10.0000 mL | INTRAVENOUS | Status: DC | PRN

## 2019-01-16 MED ORDER — AMLODIPINE BESYLATE 5 MG PO TABS
5.0000 mg | ORAL_TABLET | Freq: Every day | ORAL | Status: DC
  Administered 2019-01-16 – 2019-01-20 (×5): 5 mg via ORAL
  Filled 2019-01-16 (×5): qty 1

## 2019-01-16 MED ORDER — SODIUM CHLORIDE 0.9% FLUSH
10.0000 mL | Freq: Two times a day (BID) | INTRAVENOUS | Status: DC
  Administered 2019-01-16 – 2019-01-20 (×6): 10 mL

## 2019-01-16 MED ORDER — ONDANSETRON HCL 4 MG/2ML IJ SOLN
4.0000 mg | Freq: Four times a day (QID) | INTRAMUSCULAR | Status: DC | PRN
  Administered 2019-01-17: 4 mg via INTRAVENOUS
  Filled 2019-01-16 (×2): qty 2

## 2019-01-16 MED ORDER — ACETAMINOPHEN 500 MG PO TABS
1000.0000 mg | ORAL_TABLET | Freq: Four times a day (QID) | ORAL | Status: DC | PRN
  Administered 2019-01-16 – 2019-01-19 (×4): 1000 mg via ORAL
  Filled 2019-01-16 (×5): qty 2

## 2019-01-16 MED ORDER — ALBUTEROL SULFATE HFA 108 (90 BASE) MCG/ACT IN AERS
2.0000 | INHALATION_SPRAY | Freq: Four times a day (QID) | RESPIRATORY_TRACT | Status: DC | PRN
  Filled 2019-01-16: qty 6.7

## 2019-01-16 MED ORDER — SODIUM CHLORIDE 0.9 % IV SOLN
1.0000 g | Freq: Once | INTRAVENOUS | Status: AC
  Administered 2019-01-16: 1 g via INTRAVENOUS
  Filled 2019-01-16 (×2): qty 10

## 2019-01-16 MED ORDER — ONDANSETRON HCL 4 MG PO TABS: 4.0000 mg | ORAL_TABLET | Freq: Four times a day (QID) | ORAL | Status: DC | PRN

## 2019-01-16 NOTE — ED Notes (Signed)
IV team at pt bedside 

## 2019-01-16 NOTE — ED Notes (Signed)
Pt returned to room from MRI.

## 2019-01-16 NOTE — H&P (Signed)
History and Physical    [name] CBJ:[id number] DOB: March 17, 1971 DOA: 01/15/2019  Referring MD/NP/PA: Adin [name], PA-C PCP: Burnis Medin, MD  Patient coming from: Home   Chief Complaint: Numbness and weakness of lower extremity  I have personally briefly reviewed patient's old medical records in [place]   HPI: [name] is a 48 y.o. female with medical history significant of hypertension, keloids, and remote history of teratoma s/p removal; who presents with complaints of lower extremity numbness and weakness.  Symptoms started yesterday morning when she woke up and had some trouble getting out of bed and walking.  Complained of lower back pain with pain shooting down to her right foot.  Denies any recent falls or trauma.  She reports that the sensation in her right leg was decreased most notably in the foot and also noted some decreased sensation along the left leg.  Due to difficulty in walking she came into the emergency department for further evaluation.  Denies any fever, sick contacts, cough, shortness of breath, chest pain, nausea, vomiting,  abdominal pain, diarrhea, or dysuria symptoms.  She reports that she may have come in contact with somebody at the grocery store and that was COVID positive or possibly from 1 of her [employer].  Denies any history of MS to her knowledge in her family.  ED Course: Upon admission to the emergency department patient was seen to have a temperature up to 99.9 F, and all other vital signs maintained.  Labs revealed relatively normal CBC and CMP.  COVID-19 screening positive.  Chest x-ray noted airspace opacities on the left concerning for pneumonia.  MRI imaging of the thoracic and lumbar spine revealed hemicord abnormality of T10, bilateral pulmonary infiltrates, and degenerative changes of the L4-S1.  Neurology consulted for MRI findings.  Patient was started on antibiotics of Rocephin and azithromycin.  Inflammatory  markers pending.  TRH called to admit.   Review of Systems  Constitutional: Negative for chills and fever.  HENT: Negative for congestion and sinus pain.   Eyes: Negative for double vision and pain.  Respiratory: Negative for cough and shortness of breath.   Cardiovascular: Negative for chest pain and leg swelling.  Gastrointestinal: Negative for abdominal pain, diarrhea, nausea and vomiting.  Genitourinary: Negative for dysuria and frequency.  Musculoskeletal: Positive for back pain. Negative for falls.  Skin: Negative for itching and rash.  Neurological: Positive for sensory change and weakness.  Psychiatric/Behavioral: Negative for depression and substance abuse.    Past Medical History:  Diagnosis Date   Hypertension    Hypoglycemia    Radiation 10/21/2015-10/23/2015   Anterior neck area 12 gray in 3 fractions   Teratoma     Past Surgical History:  Procedure Laterality Date   ABDOMINAL HERNIA REPAIR     ABDOMINAL HYSTERECTOMY     CESAREAN SECTION     CRYOTHERAPY  2013   to neck   DERMOID CYST  EXCISION     ECTOPIC PREGNANCY SURGERY     EXCISION MASS NECK Bilateral 10/20/2015   Procedure: EXCISION OF LARGE KELOIDS SEVERE RIGHT AND LEFT NECK WITH PLASTIC RECONSTRUCTION;  Surgeon: Cristine Polio, MD;  Location: [facility];  Service: Plastics;  Laterality: Bilateral;   HAND SURGERY     amputation of right index finger     reports that she has never smoked. She has never used smokeless tobacco. She reports current alcohol use. She reports that she does not use drugs.  Allergies  Allergen Reactions   Tylox [Oxycodone-Acetaminophen] Swelling    Caused face to swell    Family History  Problem Relation Age of Onset   Hypertension Other     Prior to Admission medications   Medication Sig Start Date End Date Taking? Authorizing Provider  acetaminophen (TYLENOL) 500 MG tablet Take 1,000 mg by mouth every 6 (six) hours as needed for mild pain or  moderate pain.   Yes [provider]  Olmesartan-amLODIPine-HCTZ 40-5-25 MG TABS Take 1 tablet by mouth daily. 04/21/18  Yes Panosh, Standley Brooking, MD  methocarbamol (ROBAXIN) 750 MG tablet Take 1 tablet (750 mg total) by mouth 3 (three) times daily as needed (muscle spasm/pain). Patient not taking: Reported on 01/16/2019 09/24/18   Lajean Saver, MD  naproxen (NAPROSYN) 500 MG tablet Take 1 tablet (500 mg total) by mouth 2 (two) times daily. Patient not taking: Reported on 09/24/2018 05/30/18   Horton, Barbette Hair, MD    Physical Exam:  Constitutional: Obese female in no acute distress Vitals:   01/16/19 0145 01/16/19 0230 01/16/19 0816 01/16/19 0921  BP:   110/82 132/72  Pulse: 79 82 96 90  Resp:   20 20  Temp:      TempSrc:      SpO2: 95% 93% 99% 99%   Eyes: PERRL, lids and conjunctivae normal ENMT: Mucous membranes are moist. Posterior pharynx clear of any exudate or lesions. Normal dentition.  Neck: normal, supple, no masses, no thyromegaly Respiratory: clear to auscultation bilaterally, no wheezing, no crackles. Normal respiratory effort. No accessory muscle use.  Cardiovascular: Regular rate and rhythm, no murmurs / rubs / gallops. No extremity edema. 2+ pedal pulses. No carotid bruits.  Abdomen: no tenderness, no masses palpated. No hepatosplenomegaly. Bowel sounds positive.  Musculoskeletal: no clubbing / cyanosis. No joint deformity upper and lower extremities. Good ROM, no contractures. Normal muscle tone.  Skin: no rashes, lesions, ulcers. No induration Neurologic: CN 2-12 grossly intact.  Sensation abnormal most notably in the right foot.  Otherwise strength 5/5 in both upper extremities and 4+/ 5 in bilateral lower extremities. Psychiatric: Normal judgment and insight. Alert and oriented x 3. Normal mood.     Labs on Admission: I have personally reviewed following labs and imaging studies  CBC: Recent Labs  Lab 01/16/19 0712  WBC 5.3  NEUTROABS 2.4  HGB 13.6  HCT  40.3  MCV 90.2  PLT 062   Basic Metabolic Panel: Recent Labs  Lab 01/16/19 0712  NA 137  K 3.8  CL 101  CO2 24  GLUCOSE 108*  BUN 14  CREATININE 0.85  CALCIUM 8.8*   GFR: CrCl cannot be calculated (Unknown ideal weight.). Liver Function Tests: Recent Labs  Lab 01/16/19 0712  AST 38  ALT 39  ALKPHOS 68  BILITOT 1.1  PROT 7.0  ALBUMIN 3.7   No results for input(s): LIPASE, AMYLASE in the last 168 hours. No results for input(s): AMMONIA in the last 168 hours. Coagulation Profile: No results for input(s): INR, PROTIME in the last 168 hours. Cardiac Enzymes: No results for input(s): CKTOTAL, CKMB, CKMBINDEX, TROPONINI in the last 168 hours. BNP (last 3 results) No results for input(s): PROBNP in the last 8760 hours. HbA1C: No results for input(s): HGBA1C in the last 72 hours. CBG: No results for input(s): GLUCAP in the last 168 hours. Lipid Profile: No results for input(s): CHOL, HDL, LDLCALC, TRIG, CHOLHDL, LDLDIRECT in the last 72 hours. Thyroid Function Tests: No results for input(s): TSH, T4TOTAL, FREET4, T3FREE,  THYROIDAB in the last 72 hours. Anemia Panel: No results for input(s): VITAMINB12, FOLATE, FERRITIN, TIBC, IRON, RETICCTPCT in the last 72 hours. Urine analysis:    Component Value Date/Time   COLORURINE AMBER (A) 01/16/2019 0010   APPEARANCEUR HAZY (A) 01/16/2019 0010   LABSPEC 1.026 01/16/2019 0010   PHURINE 5.0 01/16/2019 0010   GLUCOSEU NEGATIVE 01/16/2019 0010   HGBUR SMALL (A) 01/16/2019 0010   HGBUR 1+ 05/16/2009 0849   BILIRUBINUR NEGATIVE 01/16/2019 0010   BILIRUBINUR N 08/10/2017 1126   KETONESUR NEGATIVE 01/16/2019 0010   PROTEINUR 30 (A) 01/16/2019 0010   UROBILINOGEN 0.2 02/22/2018 1958   NITRITE NEGATIVE 01/16/2019 0010   LEUKOCYTESUR SMALL (A) 01/16/2019 0010   Sepsis Labs: Recent Results (from the past 240 hour(s))  SARS Coronavirus 2 (CEPHEID- Performed in [facility] hospital lab), Hosp Order     Status: Abnormal    Collection Time: 01/16/19  7:12 AM  Result Value Ref Range Status   SARS Coronavirus 2 POSITIVE (A) NEGATIVE Final    Comment: RESULT CALLED TO, READ BACK BY AND VERIFIED WITH: RN N ZOHBI [id number] MLM (NOTE) If result is NEGATIVE SARS-CoV-2 target nucleic acids are NOT DETECTED. The SARS-CoV-2 RNA is generally detectable in upper and lower  respiratory specimens during the acute phase of infection. The lowest  concentration of SARS-CoV-2 viral copies this assay can detect is 250  copies / mL. A negative result does not preclude SARS-CoV-2 infection  and should not be used as the sole basis for treatment or other  patient management decisions.  A negative result may occur with  improper specimen collection / handling, submission of specimen other  than nasopharyngeal swab, presence of viral mutation(s) within the  areas targeted by this assay, and inadequate number of viral copies  (<250 copies / mL). A negative result must be combined with clinical  observations, patient history, and epidemiological information. If result is POSITIVE SARS-CoV-2 target nucleic acids are DETECTED. The SARS-C oV-2 RNA is generally detectable in upper and lower  respiratory specimens during the acute phase of infection.  Positive  results are indicative of active infection with SARS-CoV-2.  Clinical  correlation with patient history and other diagnostic information is  necessary to determine patient infection status.  Positive results do  not rule out bacterial infection or co-infection with other viruses. If result is PRESUMPTIVE POSTIVE SARS-CoV-2 nucleic acids MAY BE PRESENT.   A presumptive positive result was obtained on the submitted specimen  and confirmed on repeat testing.  While 2019 novel coronavirus  (SARS-CoV-2) nucleic acids may be present in the submitted sample  additional confirmatory testing may be necessary for epidemiological  and / or clinical management purposes  to  differentiate between  SARS-CoV-2 and other Sarbecovirus currently known to infect humans.  If clinically indicated additional testing with an alternate test  methodology [phone]) is advised.  The SARS-CoV-2 RNA is generally  detectable in upper and lower respiratory specimens during the acute  phase of infection. The expected result is Negative. Fact Sheet for Patients:  StrictlyIdeas.no Fact Sheet for Healthcare Providers: BankingDealers.co.za This test is not yet approved or cleared by the [place] FDA and has been authorized for detection and/or diagnosis of SARS-CoV-2 by FDA under an Emergency Use Authorization (EUA).  This EUA will remain in effect (meaning this test can be used) for the duration of the COVID-19 declaration under Section 564(b)(1) of the Act, 21 U.S.C. section 360bbb-3(b)(1), unless the authorization is terminated or revoked sooner.  Performed at [facility], [place] [address], [place], [place] [address]      Radiological Exams on Admission: Mr Thoracic Spine Wo Contrast  Result Date: 01/16/2019 CLINICAL DATA:  Mid back pain radiating down to the right leg. Numbness in the bilateral feet EXAM: MRI THORACIC SPINE WITHOUT CONTRAST TECHNIQUE: Multiplanar, multisequence MR imaging of the thoracic spine was performed. No intravenous contrast was administered. COMPARISON:  Chest x-ray 09/24/2018 FINDINGS: Alignment:  Negative for listhesis. Vertebrae: No fracture, evidence of discitis, or bone lesion. Cord: Best seen on sagittal STIR, but also seen on sagittal T2 weighted imaging there is a short segment hyperintensity in the right cord at T10. This area is not seen on axial slices due to slice selection. No cord expansion or volume loss. Paraspinal and other soft tissues: There is patchy bilateral lung opacity, most likely pneumonia-possibly atypical. Disc levels: Midthoracic disc narrowing with small protrusions at  T6-7 to T8-9. These results were called by telephone at the time of interpretation on 01/16/2019 at 7:11 am to Dr. Abigail Butts , who verbally acknowledged these results. IMPRESSION: 1. Short segment right hemi cord signal abnormality at T10 which would usually reflect a demyelinating process. Suggest brain MR and postcontrast imaging. 2. Patchy bilateral lung opacity compatible with pneumonia. COVID-19 should be considered specifically. Electronically Signed   By: Monte Fantasia M.D.   On: 01/16/2019 07:12   Mr Lumbar Spine Wo Contrast  Result Date: 01/16/2019 CLINICAL DATA:  Mid back pain radiating down the right leg EXAM: MRI LUMBAR SPINE WITHOUT CONTRAST TECHNIQUE: Multiplanar, multisequence MR imaging of the lumbar spine was performed. No intravenous contrast was administered. COMPARISON:  None. FINDINGS: Segmentation:  5 lumbar type vertebrae based on prior KUB Alignment:  Normal Vertebrae:  No fracture, evidence of discitis, or bone lesion. Conus medullaris and cauda equina: Conus extends to the L1-2 level. Conus and cauda equina appear normal. Paraspinal and other soft tissues: Negative Disc levels: L4-5: Disc narrowing and bulging with a central up turning protrusion. No impingement. L5-S1: Disc narrowing and bulging with endplate degeneration. No neural compression. 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 Electronically Signed   By: Monte Fantasia M.D.   On: 01/16/2019 05:19   Dg Chest Port 1 View  Result Date: 01/16/2019 CLINICAL DATA:  Chest tightness EXAM: PORTABLE CHEST 1 VIEW COMPARISON:  September 24, 2018 FINDINGS: There is atelectatic change in the lung bases. There is mild airspace opacity in the left base. The lungs elsewhere are clear. Heart is mildly enlarged with pulmonary vascularity normal. No adenopathy. No bone lesions. IMPRESSION: Airspace opacity in the left base, felt to represent pneumonia. Bibasilar  atelectasis also present. Lungs elsewhere are clear. Stable cardiac prominence. No adenopathy evident. Electronically Signed   By: Lowella Grip III M.D.   On: 01/16/2019 07:50    EKG: Independently reviewed.  Sinus rhythm at 95 bpm  Assessment/Plan Lower extremity weakness, abnormality of thoracic spine: Patient presented with complaints of weakness on the right leg worse than left.  MRI imaging reveals a hemicord abnormality at T10.  Neurology consulted.  Questioning transverse myelitis versus MS.  Patient reports no known family history of MS -Admit to a telemetry bed -Neurochecks -Follow-up MRI of the brain and cervical spine with and without contrast -Follow-up MRI of the thoracic spine with contrast -Appreciate neurology consultative services, will follow for further recommendation  Pneumonia due to COVID-19: Imaging studies revealing bilateral infiltrates concerning for pneumonia.  COVID 19 testing positive.  Lactic acid within normal limits.  Patient currently maintaining O2 saturations on room air.  -Droplet precautions -Check urine Legionella and strep pneumo -Follow-up inflammatory markers -Continue empiric antibiotics of Rocephin and azithromycin  -Albuterol inhaler as needed -Vitamin C and zinc -Antitussives as needed  DVT prophylaxis:lovenox Code Status:full Family Communication: No family present at bedside Disposition Plan: Possible discharge home in 2 to 3 days Consults called: Neurology Admission status: Inpatient  Norval Morton MD [facility] Pager [phone]   If 7PM-7AM, please contact night-coverage [email or web address] Password [email or web address]  01/16/2019, 10:40 AM

## 2019-01-16 NOTE — ED Notes (Signed)
Pt POSITIVE for COViD-19; Notified ED provider.

## 2019-01-16 NOTE — ED Notes (Signed)
ED TO INPATIENT HANDOFF REPORT  ED Nurse Name and Phone #: Percell Locus, RN   S Name/Age/Gender [name] 48 y.o. female Room/Bed: 021C/021C  Code Status   Code Status: Full Code  Home/SNF/Other Home Patient oriented to: self, place, time and situation Is this baseline? Yes   Triage Complete: Triage complete  Chief Complaint back pain numb feet  Triage Note Pt reports onset today of mid back pain that radiates to right side and down right leg. Has numbness to bilateral feet. Denies injury.    Allergies Allergies  Allergen Reactions  . Tylox [Oxycodone-Acetaminophen] Swelling    Caused face to swell    Level of Care/Admitting Diagnosis ED Disposition    ED Disposition Condition [facility] Area: [place] [[id number]]  Level of Care: Telemetry Medical [104]  Covid Evaluation: Confirmed COVID Positive  Isolation Risk Level: Low Risk/Droplet (Less than 4L [place] supplementation)  Diagnosis: Lower extremity weakness [295188]  Admitting Physician: Norval Morton [[id number]]  Attending Physician: Norval Morton [[id number]]  Estimated length of stay: past midnight tomorrow  Certification:: I certify this patient will need inpatient services for at least 2 midnights  PT Class (Do Not Modify): Inpatient [101]  PT Acc Code (Do Not Modify): Private [1]       B Medical/Surgery History Past Medical History:  Diagnosis Date  . Hypertension   . Hypoglycemia   . Radiation 10/21/2015-10/23/2015   Anterior neck area 12 gray in 3 fractions  . Teratoma    Past Surgical History:  Procedure Laterality Date  . ABDOMINAL HERNIA REPAIR    . ABDOMINAL HYSTERECTOMY    . CESAREAN SECTION    . CRYOTHERAPY  2013   to neck  . DERMOID CYST  EXCISION    . ECTOPIC PREGNANCY SURGERY    . EXCISION MASS NECK Bilateral 10/20/2015   Procedure: EXCISION OF LARGE KELOIDS SEVERE RIGHT AND LEFT NECK WITH PLASTIC RECONSTRUCTION;  Surgeon: Cristine Polio, MD;   Location: [facility];  Service: Plastics;  Laterality: Bilateral;  . HAND SURGERY     amputation of right index finger     A IV Location/Drains/Wounds Patient Lines/Drains/Airways Status   Active Line/Drains/Airways    Name:   Placement date:   Placement time:   Site:   Days:   Incision (Closed) 10/20/15 Neck Bilateral   10/20/15    0909     1184          Intake/Output Last 24 hours No intake or output data in the 24 hours ending 01/16/19 1101  Labs/Imaging Results for orders placed or performed during the hospital encounter of 01/15/19 (from the past 48 hour(s))  Urinalysis, Routine w reflex microscopic     Status: Abnormal   Collection Time: 01/16/19 12:10 AM  Result Value Ref Range   Color, Urine AMBER (A) YELLOW    Comment: BIOCHEMICALS MAY BE AFFECTED BY COLOR   APPearance HAZY (A) CLEAR   Specific Gravity, Urine 1.026 1.005 - 1.030   pH 5.0 5.0 - 8.0   Glucose, UA NEGATIVE NEGATIVE mg/dL   Hgb urine dipstick SMALL (A) NEGATIVE   Bilirubin Urine NEGATIVE NEGATIVE   Ketones, ur NEGATIVE NEGATIVE mg/dL   Protein, ur 30 (A) NEGATIVE mg/dL   Nitrite NEGATIVE NEGATIVE   Leukocytes,Ua SMALL (A) NEGATIVE   RBC / HPF 0-5 0 - 5 RBC/hpf   WBC, UA 6-10 0 - 5 WBC/hpf   Bacteria, UA RARE (A) NONE SEEN  Squamous Epithelial / LPF 0-5 0 - 5   Mucus PRESENT     Comment: Performed at [facility], [place] [address] [place],  [address]  SARS Coronavirus 2 (CEPHEID- Performed in [facility] hospital lab), Hosp Order     Status: Abnormal   Collection Time: 01/16/19  7:12 AM  Result Value Ref Range   SARS Coronavirus 2 POSITIVE (A) NEGATIVE    Comment: RESULT CALLED TO, READ BACK BY AND VERIFIED WITH: RN N Dorinne Graeff [id number] (NOTE) If result is NEGATIVE SARS-CoV-2 target nucleic acids are NOT DETECTED. The SARS-CoV-2 RNA is generally detectable in upper and lower  respiratory specimens during the acute phase of infection. The lowest  concentration of  SARS-CoV-2 viral copies this assay can detect is 250  copies / mL. A negative result does not preclude SARS-CoV-2 infection  and should not be used as the sole basis for treatment or other  patient management decisions.  A negative result may occur with  improper specimen collection / handling, submission of specimen other  than nasopharyngeal swab, presence of viral mutation(s) within the  areas targeted by this assay, and inadequate number of viral copies  (<250 copies / mL). A negative result must be combined with clinical  observations, patient history, and epidemiological information. If result is POSITIVE SARS-CoV-2 target nucleic acids are DETECTED. The SARS-C oV-2 RNA is generally detectable in upper and lower  respiratory specimens during the acute phase of infection.  Positive  results are indicative of active infection with SARS-CoV-2.  Clinical  correlation with patient history and other diagnostic information is  necessary to determine patient infection status.  Positive results do  not rule out bacterial infection or co-infection with other viruses. If result is PRESUMPTIVE POSTIVE SARS-CoV-2 nucleic acids MAY BE PRESENT.   A presumptive positive result was obtained on the submitted specimen  and confirmed on repeat testing.  While 2019 novel coronavirus  (SARS-CoV-2) nucleic acids may be present in the submitted sample  additional confirmatory testing may be necessary for epidemiological  and / or clinical management purposes  to differentiate between  SARS-CoV-2 and other Sarbecovirus currently known to infect humans.  If clinically indicated additional testing with an alternate test  methodology [id number]) is advised.  The SARS-CoV-2 RNA is generally  detectable in upper and lower respiratory specimens during the acute  phase of infection. The expected result is Negative. Fact Sheet for Patients:  StrictlyIdeas.no Fact Sheet for Healthcare  Providers: BankingDealers.co.za This test is not yet approved or cleared by the [place] FDA and has been authorized for detection and/or diagnosis of SARS-CoV-2 by FDA under an Emergency Use Authorization (EUA).  This EUA will remain in effect (meaning this test can be used) for the duration of the COVID-19 declaration under Section 564(b)(1) of the Act, 21 U.S.C. section 360bbb-3(b)(1), unless the authorization is terminated or revoked sooner. Performed at [facility], [place] [address] [place],  [address]   CBC with Differential     Status: None   Collection Time: 01/16/19  7:12 AM  Result Value Ref Range   WBC 5.3 4.0 - 10.5 K/uL   RBC 4.47 3.87 - 5.11 MIL/uL   Hemoglobin 13.6 12.0 - 15.0 g/dL   HCT 40.3 36.0 - 46.0 %   MCV 90.2 80.0 - 100.0 fL   MCH 30.4 26.0 - 34.0 pg   MCHC 33.7 30.0 - 36.0 g/dL   RDW 13.5 11.5 - 15.5 %  Platelets 169 150 - 400 K/uL   nRBC 0.0 0.0 - 0.2 %   Neutrophils Relative % 45 %   Neutro Abs 2.4 1.7 - 7.7 K/uL   Lymphocytes Relative 49 %   Lymphs Abs 2.6 0.7 - 4.0 K/uL   Monocytes Relative 6 %   Monocytes Absolute 0.3 0.1 - 1.0 K/uL   Eosinophils Relative 0 %   Eosinophils Absolute 0.0 0.0 - 0.5 K/uL   Basophils Relative 0 %   Basophils Absolute 0.0 0.0 - 0.1 K/uL   Immature Granulocytes 0 %   Abs Immature Granulocytes 0.01 0.00 - 0.07 K/uL    Comment: Performed at [facility] [address] [place], [place] [address]  Comprehensive metabolic panel     Status: Abnormal   Collection Time: 01/16/19  7:12 AM  Result Value Ref Range   Sodium 137 135 - 145 mmol/L   Potassium 3.8 3.5 - 5.1 mmol/L   Chloride 101 98 - 111 mmol/L   CO2 24 22 - 32 mmol/L   Glucose, Bld 108 (H) 70 - 99 mg/dL   BUN 14 6 - 20 mg/dL   Creatinine, Ser 0.85 0.44 - 1.00 mg/dL   Calcium 8.8 (L) 8.9 - 10.3 mg/dL   Total Protein 7.0 6.5 - 8.1 g/dL   Albumin 3.7 3.5 - 5.0 g/dL   AST 38 15 - 41 U/L   ALT 39 0 - 44 U/L    Alkaline Phosphatase 68 38 - 126 U/L   Total Bilirubin 1.1 0.3 - 1.2 mg/dL   GFR calc non Af Amer >60 >60 mL/min   GFR calc Af Amer >60 >60 mL/min   Anion gap 12 5 - 15    Comment: Performed at [facility] [address], [place], [place] [address]  I-Stat beta hCG blood, ED     Status: None   Collection Time: 01/16/19  9:59 AM  Result Value Ref Range   I-stat hCG, quantitative <5.0 <5 mIU/mL   Comment 3            Comment:   GEST. AGE      CONC.  (mIU/mL)   <=1 WEEK        5 - 50     2 WEEKS       50 - 500     3 WEEKS       100 - 10,000     4 WEEKS     1,000 - 30,000        FEMALE AND NON-PREGNANT FEMALE:     LESS THAN 5 mIU/mL    Mr Thoracic Spine Wo Contrast  Result Date: 01/16/2019 CLINICAL DATA:  Mid back pain radiating down to the right leg. Numbness in the bilateral feet EXAM: MRI THORACIC SPINE WITHOUT CONTRAST TECHNIQUE: Multiplanar, multisequence MR imaging of the thoracic spine was performed. No intravenous contrast was administered. COMPARISON:  Chest x-ray 09/24/2018 FINDINGS: Alignment:  Negative for listhesis. Vertebrae: No fracture, evidence of discitis, or bone lesion. Cord: Best seen on sagittal STIR, but also seen on sagittal T2 weighted imaging there is a short segment hyperintensity in the right cord at T10. This area is not seen on axial slices due to slice selection. No cord expansion or volume loss. Paraspinal and other soft tissues: There is patchy bilateral lung opacity, most likely pneumonia-possibly atypical. Disc levels: Midthoracic disc narrowing with small protrusions at T6-7 to T8-9. These results were called by telephone at the time of interpretation  on 01/16/2019 at 7:11 am to Dr. Abigail Butts , who verbally acknowledged these results. IMPRESSION: 1. Short segment right hemi cord signal abnormality at T10 which would usually reflect a demyelinating process. Suggest brain MR and postcontrast imaging. 2. Patchy bilateral lung opacity compatible with  pneumonia. COVID-19 should be considered specifically. Electronically Signed   By: Monte Fantasia M.D.   On: 01/16/2019 07:12   Mr Lumbar Spine Wo Contrast  Result Date: 01/16/2019 CLINICAL DATA:  Mid back pain radiating down the right leg EXAM: MRI LUMBAR SPINE WITHOUT CONTRAST TECHNIQUE: Multiplanar, multisequence MR imaging of the lumbar spine was performed. No intravenous contrast was administered. COMPARISON:  None. FINDINGS: Segmentation:  5 lumbar type vertebrae based on prior KUB Alignment:  Normal Vertebrae:  No fracture, evidence of discitis, or bone lesion. Conus medullaris and cauda equina: Conus extends to the L1-2 level. Conus and cauda equina appear normal. Paraspinal and other soft tissues: Negative Disc levels: L4-5: Disc narrowing and bulging with a central up turning protrusion. No impingement. L5-S1: Disc narrowing and bulging with endplate degeneration. No neural compression. 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 Electronically Signed   By: Monte Fantasia M.D.   On: 01/16/2019 05:19   Dg Chest Port 1 View  Result Date: 01/16/2019 CLINICAL DATA:  Chest tightness EXAM: PORTABLE CHEST 1 VIEW COMPARISON:  September 24, 2018 FINDINGS: There is atelectatic change in the lung bases. There is mild airspace opacity in the left base. The lungs elsewhere are clear. Heart is mildly enlarged with pulmonary vascularity normal. No adenopathy. No bone lesions. IMPRESSION: Airspace opacity in the left base, felt to represent pneumonia. Bibasilar atelectasis also present. Lungs elsewhere are clear. Stable cardiac prominence. No adenopathy evident. Electronically Signed   By: Lowella Grip III M.D.   On: 01/16/2019 07:50    Pending Labs Unresulted Labs (From admission, onward)    Start     Ordered   01/17/19 0500  HIV antibody (Routine Testing)  Tomorrow morning,   R     01/16/19 1055   01/17/19 0500  CBC with  Differential/Platelet  Daily,   R     01/16/19 1055   01/17/19 0500  Comprehensive metabolic panel  Daily,   R     01/16/19 1055   01/17/19 0500  C-reactive protein  Daily,   R     01/16/19 1055   01/17/19 0500  CK  Daily,   R     01/16/19 1055   01/17/19 0500  D-dimer, quantitative (not at [facility])  Daily,   R     01/16/19 1055   01/17/19 0500  Ferritin  Daily,   R     01/16/19 1055   01/17/19 0500  Interleukin-6, Plasma  Daily,   R     01/16/19 1055   01/17/19 0500  Magnesium  Daily,   R     01/16/19 1055   01/17/19 0500  Phosphorus  Daily,   R     01/16/19 1055   01/17/19 0500  Triglycerides  Daily,   R     01/16/19 1055   01/16/19 1050  Strep pneumoniae urinary antigen  Once,   R     01/16/19 1055   01/16/19 1050  Legionella Pneumophila Serogp 1 Ur Ag  Once,   R     01/16/19 1055   01/16/19 0951  Lactic acid, plasma  Now then every 2 hours,   STAT  01/16/19 0951   01/16/19 0951  Blood Culture (routine x 2)  BLOOD CULTURE X 2,   STAT     01/16/19 0951   01/16/19 0951  D-dimer, quantitative  ONCE - STAT,   STAT    Comments:  Used for prognosis and bed placement. Do not order CT or V/Q.    01/16/19 0951   01/16/19 0951  Procalcitonin  ONCE - STAT,   STAT     01/16/19 0951   01/16/19 0951  Lactate dehydrogenase  Once,   STAT     01/16/19 0951   01/16/19 0951  Ferritin  Once,   STAT     01/16/19 0951   01/16/19 0951  Triglycerides  Once,   STAT     01/16/19 0951   01/16/19 0951  Fibrinogen  Once,   STAT     01/16/19 0951   01/16/19 0951  C-reactive protein  Once,   STAT     01/16/19 0951   01/16/19 0201  Urine culture  ONCE - STAT,   STAT     01/16/19 0200          Vitals/Pain Today's Vitals   01/16/19 0723 01/16/19 0743 01/16/19 0816 01/16/19 0921  BP:   110/82 132/72  Pulse:   96 90  Resp:   20 20  Temp:      TempSrc:      SpO2:   99% 99%  PainSc: 0-No pain 0-No pain      Isolation Precautions Droplet and Contact precautions  Medications Medications   oxyCODONE-acetaminophen (PERCOCET/ROXICET) 5-325 MG per tablet 1 tablet (1 tablet Oral Given 01/15/19 2046)  cefTRIAXone (ROCEPHIN) 1 g in sodium chloride 0.9 % 100 mL IVPB (has no administration in time range)  azithromycin (ZITHROMAX) 500 mg in sodium chloride 0.9 % 250 mL IVPB (has no administration in time range)  acetaminophen (TYLENOL) tablet 1,000 mg (has no administration in time range)  Olmesartan-amLODIPine-HCTZ 40-5-25 MG TABS 1 tablet (has no administration in time range)  albuterol (VENTOLIN HFA) 108 (90 Base) MCG/ACT inhaler 2 puff (has no administration in time range)  guaiFENesin-dextromethorphan (ROBITUSSIN DM) 100-10 MG/5ML syrup 10 mL (has no administration in time range)  chlorpheniramine-HYDROcodone (TUSSIONEX) 10-8 MG/5ML suspension 5 mL (has no administration in time range)  vitamin C (ASCORBIC ACID) tablet 500 mg (has no administration in time range)  zinc sulfate capsule 220 mg (has no administration in time range)  enoxaparin (LOVENOX) injection 40 mg (has no administration in time range)  sodium chloride flush (NS) 0.9 % injection 3 mL (has no administration in time range)  ondansetron (ZOFRAN) tablet 4 mg (has no administration in time range)    Or  ondansetron (ZOFRAN) injection 4 mg (has no administration in time range)  LORazepam (ATIVAN) tablet 1 mg (1 mg Oral Given 01/16/19 0334)    Mobility walks Low fall risk   Focused Assessments Neuro Assessment Handoff:  Swallow screen pass? Yes          Neuro Assessment:   Neuro Checks:      Last Documented NIHSS Modified Score:   Has TPA been given? No If patient is a Neuro Trauma and patient is going to OR before floor call report to [place] nurse: [phone] or [phone]     R Recommendations: See Admitting Provider Note  Report given to:   Additional Notes:

## 2019-01-16 NOTE — ED Provider Notes (Signed)
Care assumed from [facility], PA-C at shift change with MRI pendings  In brief, this patient is a 48 y.o. F who presents for evaluation of acute onset previously worsening low back pain with radiation down her right lower extremity.  He also reports some numbness noted to bilateral feet.  She states that symptoms initially get about 5 PM yesterday.  No new trauma, injury, fall.  Patient with no red flags for back issues.  See note from previous provider for full history/physical exam.   Physical Exam  BP 132/72 (BP Location: Right Arm)   Pulse 90   Temp 99.9 F (37.7 C) (Oral)   Resp 20   SpO2 99%   Physical Exam  Per physical exam from previous provider: Speech is clear and goal oriented, follows commands Normal 5/5 strength in upper and lower extremities bilaterally including dorsiflexion and plantar flexion, strong and equal grip strength Sensation with subjective paresthesias and decreased sensation to sharp/dull in BLE: R (significantly) >L Ataxic, wide based slightly shuffling gait Pt reports feeling off balance due to sensation of weakness in legs No Clonus   ED Course/Procedures   Clinical Course as of Jan 16 1019  Tue Jan 16, 2019  0543 No findings on the L-spine MRI. Will obtain T-spine in addition as neurologic symptoms persist.    [HM]  0544 The patient was discussed with and seen by Dr. Roxanne Mins who agrees with the treatment plan.    [HM]  [id number] Discussed with Radiology.  Pt with isolated T-10 lesion. Concern for MS.  Will need neuro consult and admission for MS work-up.  Of concern, MRI shows bilateral ground glass opacities concerning for COVID.  Test ordered and pt placed on precautions.     [HM]    Clinical Course User Index [HM] Muthersbaugh, Gwenlyn Perking    .Critical Care Performed by: Volanda Napoleon, PA-C Authorized by: Volanda Napoleon, PA-C   Critical care provider statement:    Critical care time (minutes):  35   Critical care was  necessary to treat or prevent imminent or life-threatening deterioration of the following conditions:  CNS failure or compromise   Critical care was time spent personally by me on the following activities:  Discussions with consultants, evaluation of patient's response to treatment, examination of patient, ordering and performing treatments and interventions, ordering and review of laboratory studies, ordering and review of radiographic studies, pulse oximetry, re-evaluation of patient's condition, obtaining history from patient or surrogate and review of old charts      Results for orders placed or performed during the hospital encounter of 01/15/19 (from the past 24 hour(s))  Urinalysis, Routine w reflex microscopic     Status: Abnormal   Collection Time: 01/16/19 12:10 AM  Result Value Ref Range   Color, Urine AMBER (A) YELLOW   APPearance HAZY (A) CLEAR   Specific Gravity, Urine 1.026 1.005 - 1.030   pH 5.0 5.0 - 8.0   Glucose, UA NEGATIVE NEGATIVE mg/dL   Hgb urine dipstick SMALL (A) NEGATIVE   Bilirubin Urine NEGATIVE NEGATIVE   Ketones, ur NEGATIVE NEGATIVE mg/dL   Protein, ur 30 (A) NEGATIVE mg/dL   Nitrite NEGATIVE NEGATIVE   Leukocytes,Ua SMALL (A) NEGATIVE   RBC / HPF 0-5 0 - 5 RBC/hpf   WBC, UA 6-10 0 - 5 WBC/hpf   Bacteria, UA RARE (A) NONE SEEN   Squamous Epithelial / LPF 0-5 0 - 5   Mucus PRESENT   SARS Coronavirus 2 (CEPHEID- Performed in  [facility] hospital lab), Hosp Order     Status: Abnormal   Collection Time: 01/16/19  7:12 AM  Result Value Ref Range   SARS Coronavirus 2 POSITIVE (A) NEGATIVE  CBC with Differential     Status: None   Collection Time: 01/16/19  7:12 AM  Result Value Ref Range   WBC 5.3 4.0 - 10.5 K/uL   RBC 4.47 3.87 - 5.11 MIL/uL   Hemoglobin 13.6 12.0 - 15.0 g/dL   HCT 40.3 36.0 - 46.0 %   MCV 90.2 80.0 - 100.0 fL   MCH 30.4 26.0 - 34.0 pg   MCHC 33.7 30.0 - 36.0 g/dL   RDW 13.5 11.5 - 15.5 %   Platelets 169 150 - 400 K/uL   nRBC 0.0  0.0 - 0.2 %   Neutrophils Relative % 45 %   Neutro Abs 2.4 1.7 - 7.7 K/uL   Lymphocytes Relative 49 %   Lymphs Abs 2.6 0.7 - 4.0 K/uL   Monocytes Relative 6 %   Monocytes Absolute 0.3 0.1 - 1.0 K/uL   Eosinophils Relative 0 %   Eosinophils Absolute 0.0 0.0 - 0.5 K/uL   Basophils Relative 0 %   Basophils Absolute 0.0 0.0 - 0.1 K/uL   Immature Granulocytes 0 %   Abs Immature Granulocytes 0.01 0.00 - 0.07 K/uL  Comprehensive metabolic panel     Status: Abnormal   Collection Time: 01/16/19  7:12 AM  Result Value Ref Range   Sodium 137 135 - 145 mmol/L   Potassium 3.8 3.5 - 5.1 mmol/L   Chloride 101 98 - 111 mmol/L   CO2 24 22 - 32 mmol/L   Glucose, Bld 108 (H) 70 - 99 mg/dL   BUN 14 6 - 20 mg/dL   Creatinine, Ser 0.85 0.44 - 1.00 mg/dL   Calcium 8.8 (L) 8.9 - 10.3 mg/dL   Total Protein 7.0 6.5 - 8.1 g/dL   Albumin 3.7 3.5 - 5.0 g/dL   AST 38 15 - 41 U/L   ALT 39 0 - 44 U/L   Alkaline Phosphatase 68 38 - 126 U/L   Total Bilirubin 1.1 0.3 - 1.2 mg/dL   GFR calc non Af Amer >60 >60 mL/min   GFR calc Af Amer >60 >60 mL/min   Anion gap 12 5 - 15  I-Stat beta hCG blood, ED     Status: None   Collection Time: 01/16/19  9:59 AM  Result Value Ref Range   I-stat hCG, quantitative <5.0 <5 mIU/mL   Comment 3             MDM   PLAN: Patient with MRI of T-spine that shows a new T10 lesion concerning for MS.  Recommend neuro consult.  Additionally, on her MRI, there is concerning for some groundglass opacities in her lungs suspicious for COVID-19.  Patient is asymptomatic at this time.  We will plan for labs, COVID swab.  Additionally, we will consult neuro for further recommendation.  Will need admission for further evaluation and MRI of C-spine and brain.  MRI of the spine shows short segment right hemicord spinal abnormality at T10 which could reflect a demyelinating process.  Discussed patient with Dr. Lorraine Lax (Neuro).  Concern for MS vs transverse myelitis.  Recommends obtaining MRI  of brain and C-spine.  Recommends holding off on any steroids until COVID test is resulted.  Will need admission.  He would like repeat MRI thoracic spine with contrast.  Discussed with Dr. Lorraine Lax after COVID  testing returned.  Agrees with plans for medical admission with plans for neuro consult.  Hold off on any steroids at this time.  Patient will need an LP at some point during admission. Discussed patient with Dr. Rogene Houston urgency of LP, recommend holding off on doing it in the ED given COVID positive.  Due to patient on plan.  She is agreeable.  UA shows small leukocytes. CMP is unremarkable.  CBC without any significant leukocytosis.  Will start patient on antibiotics given presumed pneumonia and COVID status.  Discussed patient with Dr. Tamala Julian (Hospitalist). Will admit.   Portions of this note were generated with [employer]. Dictation errors may occur despite best attempts at proofreading.   1. COVID-19 virus detected   2. Acute bilateral low back pain, unspecified whether sciatica present   3. Paresthesia       Volanda Napoleon, PA-C 01/16/19 1341    Fredia Sorrow, MD 01/17/19 Kathyrn Drown

## 2019-01-16 NOTE — Consult Note (Addendum)
Requesting Physician: Providence Lanius    Chief Complaint:  Back pain, leg weakness, numbness  History obtained from: Patient and Chart     HPI:                                                                                                                                       [name] is a 48 y.o. female with past medical history of hypertension, obesity presents the ED with 2-day history of bilateral lower extremity weakness and numbness.  Patient states that when she woke up on Sunday, she noticed she had trouble getting off the bed and had trouble walking.  She also felt back pain with the pain radiating down to her right leg..  She also felt that her lower right leg was numb and there was some little numbness in the left leg as well.  Her symptoms did not progress over the past few days, however she continued to have trouble walking and decided to come to the emergency room today.  She denies any urinary incontinence, bandlike sensation around her abdomen.   MRI of her L-spine and T-spine was performed in the ER which demonstrated a T2 signal lesion in the T-spine at level of T10.  Incidentally, MRI of her T-spine showed bilateral lung opacities consistent with pneumonia.  COVID-19 test was performed and was positive. Denies any fever, shortness of breath, cough.      Past Medical History:  Diagnosis Date  . Hypertension   . Hypoglycemia   . Radiation 10/21/2015-10/23/2015   Anterior neck area 12 gray in 3 fractions  . Teratoma     Past Surgical History:  Procedure Laterality Date  . ABDOMINAL HERNIA REPAIR    . ABDOMINAL HYSTERECTOMY    . CESAREAN SECTION    . CRYOTHERAPY  2013   to neck  . DERMOID CYST  EXCISION    . ECTOPIC PREGNANCY SURGERY    . EXCISION MASS NECK Bilateral 10/20/2015   Procedure: EXCISION OF LARGE KELOIDS SEVERE RIGHT AND LEFT NECK WITH PLASTIC RECONSTRUCTION;  Surgeon: Cristine Polio, MD;  Location: [facility];  Service: Plastics;   Laterality: Bilateral;  . HAND SURGERY     amputation of right index finger    Family History  Problem Relation Age of Onset  . Hypertension Other    Social History:  reports that she has never smoked. She has never used smokeless tobacco. She reports current alcohol use. She reports that she does not use drugs.  Allergies:  Allergies  Allergen Reactions  . Tylox [Oxycodone-Acetaminophen] Swelling    Caused face to swell    Medications:  I reviewed home medications   ROS:                                                                                                                                     14 systems reviewed and negative except above    Examination:                                                                                                      General: Appears well-developed and well-nourished.  Psych: Affect appropriate to situation Eyes: No scleral injection HENT: No OP obstrucion Head: Normocephalic.  Cardiovascular: Normal rate and regular rhythm.  Respiratory: Effort normal and breath sounds normal to anterior ascultation GI: Soft.  No distension. There is no tenderness.  Skin: WDI    Neurological Examination Mental Status: Alert, oriented, thought content appropriate.  Speech fluent without evidence of aphasia.  Able to follow 3 step commands without difficulty. Cranial Nerves: II: visual fields grossly normal, pupils equal, round, reactive to light and accommodation III,IV, VI: ptosis not present, extraocular muscles extra-ocular motions intact bilaterally V,VII: smile symmetric, facial light touch sensation normal bilaterally VIII: hearing normal bilaterally IX,X: gag reflex present XI: trapezius strength/neck flexion strength normal bilaterally XII: tongue strength normal  Motor: Right : Upper extremity    Left:     Upper  extremity 5/5 deltoid       5/5 deltoid 5/5 tricep      5/5 tricep 5/5 biceps      5/5 biceps  5/5wrist flexion     5/5 wrist flexion 5/5 wrist extension     5/5 wrist extension 5/5 hand grip      5/5 hand grip  Lower extremity     Lower extremity 5/5 hip flexor      5/5 hip flexor 5/5 hip adductors     5/5 hip adductors 5/5 hip abductors     5/5 hip abductors 5/5 quadricep      5/5 quadriceps  5/5 hamstrings     5/5 hamstrings 4/5 plantar flexion       5/5 plantar flexion 4/5 plantar extension     5/5 plantar extension Tone and bulk:normal tone throughout; no atrophy noted Sensory:  Reduced sensation to pin prick, light touch over plantar and dorsal surfaces of right foot upto level ankle, intact vibration and positron sense. Slightly reduced over left leg. Normal sensation over bilateral upper extremities  Deep Tendon Reflexes:  Right: Upper Extremity   Left: Upper extremity   biceps (  C-5 to C-6) 2/4   biceps (C-5 to C-6) 2/4 tricep (C7) 2/4    triceps (C7) 2/4 Brachioradialis (C6) 2/4  Brachioradialis (C6) 2/4  Lower Extremity Lower Extremity  quadriceps (L-2 to L-4) 2/4   quadriceps (L-2 to L-4) 2/4 Achilles (S1) 2/4   Achilles (S1) 2/4 Plantars: Right: downgoing   Left: downgoing Cerebellar: normal finger-to-nose    Lab Results: Basic Metabolic Panel: Recent Labs  Lab 01/16/19 0712  NA 137  K 3.8  CL 101  CO2 24  GLUCOSE 108*  BUN 14  CREATININE 0.85  CALCIUM 8.8*    CBC: Recent Labs  Lab 01/16/19 0712  WBC 5.3  NEUTROABS 2.4  HGB 13.6  HCT 40.3  MCV 90.2  PLT 169    Coagulation Studies: No results for input(s): LABPROT, INR in the last 72 hours.  Imaging: Mr Thoracic Spine Wo Contrast  Result Date: 01/16/2019 CLINICAL DATA:  Mid back pain radiating down to the right leg. Numbness in the bilateral feet EXAM: MRI THORACIC SPINE WITHOUT CONTRAST TECHNIQUE: Multiplanar, multisequence MR imaging of the thoracic spine was performed. No intravenous  contrast was administered. COMPARISON:  Chest x-ray 09/24/2018 FINDINGS: Alignment:  Negative for listhesis. Vertebrae: No fracture, evidence of discitis, or bone lesion. Cord: Best seen on sagittal STIR, but also seen on sagittal T2 weighted imaging there is a short segment hyperintensity in the right cord at T10. This area is not seen on axial slices due to slice selection. No cord expansion or volume loss. Paraspinal and other soft tissues: There is patchy bilateral lung opacity, most likely pneumonia-possibly atypical. Disc levels: Midthoracic disc narrowing with small protrusions at T6-7 to T8-9. These results were called by telephone at the time of interpretation on 01/16/2019 at 7:11 am to Dr. Abigail Butts , who verbally acknowledged these results. IMPRESSION: 1. Short segment right hemi cord signal abnormality at T10 which would usually reflect a demyelinating process. Suggest brain MR and postcontrast imaging. 2. Patchy bilateral lung opacity compatible with pneumonia. COVID-19 should be considered specifically. Electronically Signed   By: Monte Fantasia M.D.   On: 01/16/2019 07:12   Mr Lumbar Spine Wo Contrast  Result Date: 01/16/2019 CLINICAL DATA:  Mid back pain radiating down the right leg EXAM: MRI LUMBAR SPINE WITHOUT CONTRAST TECHNIQUE: Multiplanar, multisequence MR imaging of the lumbar spine was performed. No intravenous contrast was administered. COMPARISON:  None. FINDINGS: Segmentation:  5 lumbar type vertebrae based on prior KUB Alignment:  Normal Vertebrae:  No fracture, evidence of discitis, or bone lesion. Conus medullaris and cauda equina: Conus extends to the L1-2 level. Conus and cauda equina appear normal. Paraspinal and other soft tissues: Negative Disc levels: L4-5: Disc narrowing and bulging with a central up turning protrusion. No impingement. L5-S1: Disc narrowing and bulging with endplate degeneration. No neural compression. IMPRESSION: L4-5 and L5-S1 moderate disc  degeneration without neural compression to explain right leg symptoms. Electronically Signed   By: Monte Fantasia M.D.   On: 01/16/2019 05:19   Dg Chest Port 1 View  Result Date: 01/16/2019 CLINICAL DATA:  Chest tightness EXAM: PORTABLE CHEST 1 VIEW COMPARISON:  September 24, 2018 FINDINGS: There is atelectatic change in the lung bases. There is mild airspace opacity in the left base. The lungs elsewhere are clear. Heart is mildly enlarged with pulmonary vascularity normal. No adenopathy. No bone lesions. IMPRESSION: Airspace opacity in the left base, felt to represent pneumonia. Bibasilar atelectasis also present. Lungs elsewhere are clear. Stable cardiac prominence. No adenopathy  evident. Electronically Signed   By: Lowella Grip III M.D.   On: 01/16/2019 07:50     I have reviewed the above imaging : MRI T and L spine    ASSESSMENT AND PLAN  48 y.o. female with past medical history of hypertension, obesity presents the ED with 2-day history of bilateral lower extremity weakness and numbness.  Impression:  T10 hemicord signal abnormality ? Postinfectious myelitis versus demyelination from systemic autoimmune condition  SARS- COV2 infection with pneumonia   Recommendations MRI brain and C-spine with and without contrast to see if they are more lesions, if present would favor MS, systemic conditions MRI T-spine with contrast  We will hold off steroids for now as she is being actively treated for COVID pneumonia. Would appreciate ID recommendations regarding safety of starting steroids. If symptoms progress, consider starting lower dose of steroids such as 100 mg of IV methylprednisone daily. Consider LP Frequent Neurochecks, bladder scans to check for urinary retention    Lamberto Dinapoli [facility] Pager Number [phone]

## 2019-01-16 NOTE — ED Notes (Signed)
Pt currently in MRI; report given to RN on 2W. Will transport pt upstairs once MRI is completed.

## 2019-01-16 NOTE — ED Provider Notes (Addendum)
[facility] EMERGENCY DEPARTMENT Provider Note   CSN: [id number] Arrival date & time: 01/15/19  2011    History   Chief Complaint Chief Complaint  Patient presents with   Back Pain    HPI [name] is a 48 y.o. female with a hx of HTN, obesity, sleep apnea presents to the Emergency Department complaining of gradual, persistent, progressively worsening low back pain with radiation down her right leg.  Pt reports her bilateral feet feel tingly.  Pt reports all her symptoms began around 5pm yesterday.  She denies new activities, falls or known injury.  She denies all urinary symptoms.  She reports taking 1 dose of tylenol with improvement this morning.  Pt reports walking makes the pain worse.  She reports walking slowly, but is not having difficulty moving her legs.  She reports her legs feel weak.  Pt denies hx of cancer, IVDU, blood thinners.  No back surgeries.  She denies loss of bowel or bladder control.  Pt reports no saddle anesthesia.  Pt denies fever, chills, headache, neck pain, chest pain, SOB, abd pain, N/V/D, weakness, dizziness, syncope.  Pt denies rashes or tick bites.      The history is provided by the patient and medical records. No language interpreter was used.    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   Snoring 10/22/2016   Nocturnal dyspnea/choking   10/22/2016   Keloid of skin 09/25/2015   Essential hypertension, benign 09/27/2014   Teratoma 08/07/2014   Severe hypertension 06/08/2013   HYPERLIPIDEMIA, MILD, WITH LOW HDL 07/23/2009   SHORTNESS OF BREATH 02/11/2009   HYPERTENSION 05/07/2008    KELOID SCAR 05/07/2008   HEADACHE 05/22/2007    Past Surgical History:  Procedure Laterality Date   ABDOMINAL HERNIA REPAIR     ABDOMINAL HYSTERECTOMY     CESAREAN SECTION     CRYOTHERAPY  2013   to neck   DERMOID CYST  EXCISION     ECTOPIC PREGNANCY SURGERY     EXCISION MASS NECK Bilateral 10/20/2015   Procedure: EXCISION OF LARGE KELOIDS SEVERE RIGHT AND LEFT NECK WITH PLASTIC RECONSTRUCTION;  Surgeon: Cristine Polio, MD;  Location: [facility];  Service: Plastics;  Laterality: Bilateral;   HAND SURGERY     amputation of right index finger     OB History   No obstetric history on file.      Home Medications    Prior to Admission medications   Medication Sig Start Date End Date Taking? Authorizing Provider  Ascorbic Acid (VITAMIN C PO) Take 1 tablet by mouth daily.    [provider]  methocarbamol (ROBAXIN) 750 MG tablet Take 1 tablet (750 mg total) by mouth 3 (three) times daily as needed (muscle spasm/pain). 09/24/18   Lajean Saver, MD  naproxen (NAPROSYN) 500 MG tablet Take 1 tablet (500 mg total) by mouth 2 (two) times daily. Patient not taking: Reported on 09/24/2018 05/30/18   Horton, Barbette Hair, MD  Olmesartan-amLODIPine-HCTZ 40-5-25 MG TABS Take 1 tablet by mouth daily. 04/21/18   Panosh, Standley Brooking, MD  TURMERIC PO Take by mouth. liquid  [provider]    Family History Family History  Problem Relation Age of Onset   Hypertension Other     Social History Social History   Tobacco Use   Smoking status: Never Smoker   Smokeless tobacco: Never Used  Substance Use Topics   Alcohol use: Yes    Comment: socially   Drug use: No     Allergies   Tylox [oxycodone-acetaminophen]   Review of Systems Review of Systems  Constitutional: Negative for appetite change, diaphoresis, fatigue, fever and unexpected weight change.  HENT: Negative for mouth sores.   Eyes: Negative for visual disturbance.  Respiratory:  Negative for cough, chest tightness, shortness of breath and wheezing.   Cardiovascular: Negative for chest pain.  Gastrointestinal: Negative for abdominal pain, constipation, diarrhea, nausea and vomiting.  Endocrine: Negative for polydipsia, polyphagia and polyuria.  Genitourinary: Negative for dysuria, frequency, hematuria and urgency.  Musculoskeletal: Positive for back pain. Negative for neck stiffness.  Skin: Negative for rash.  Allergic/Immunologic: Negative for immunocompromised state.  Neurological: Positive for weakness (legs) and numbness ( feet). Negative for syncope, light-headedness and headaches.  Hematological: Does not bruise/bleed easily.  Psychiatric/Behavioral: Negative for sleep disturbance. The patient is not nervous/anxious.      Physical Exam Updated Vital Signs BP 111/73 (BP Location: Left Arm)    Pulse 72    Temp 99.9 F (37.7 C) (Oral)    Resp 18    SpO2 97%   Physical Exam Vitals signs and nursing note reviewed.  Constitutional:      General: She is not in acute distress.    Appearance: She is well-developed. She is not diaphoretic.  HENT:     Head: Normocephalic and atraumatic.     Mouth/Throat:     Pharynx: No oropharyngeal exudate.  Eyes:     Conjunctiva/sclera: Conjunctivae normal.  Neck:     Musculoskeletal: Normal range of motion and neck supple.     Comments: Full ROM without pain Cardiovascular:     Rate and Rhythm: Normal rate and regular rhythm.  Pulmonary:     Effort: Pulmonary effort is normal. No respiratory distress.     Breath sounds: Normal breath sounds. No wheezing.  Abdominal:     General: There is no distension.     Palpations: Abdomen is soft.     Tenderness: There is no abdominal tenderness.  Musculoskeletal:     Comments: Tenderness to palpation of the midline lower thoracic spine and entire lumbar spine.  Palpable paraspinal muscle spasm.    Lymphadenopathy:     Cervical: No cervical adenopathy.  Skin:    General: Skin  is warm and dry.     Findings: No erythema or rash.  Neurological:     Mental Status: She is alert.     Sensory: Sensory deficit present.     Comments: Speech is clear and goal oriented, follows commands Normal 5/5 strength in upper and lower extremities bilaterally including dorsiflexion and plantar flexion, strong and equal grip strength Sensation with subjective paresthesias and decreased sensation to sharp/dull in BLE: R (significantly) >L Ataxic, wide based slightly shuffling gait Pt reports feeling off balance due to sensation of weakness in legs No Clonus  Psychiatric:        Behavior: Behavior normal.      ED Treatments / Results  Labs (all labs ordered are listed, but only abnormal results are displayed) Labs Reviewed  URINALYSIS, ROUTINE W REFLEX MICROSCOPIC - Abnormal; Notable for the following components:  Result Value   Color, Urine AMBER (*)    APPearance HAZY (*)    Hgb urine dipstick SMALL (*)    Protein, ur 30 (*)    Leukocytes,Ua SMALL (*)    Bacteria, UA RARE (*)    All other components within normal limits  URINE CULTURE  SARS CORONAVIRUS 2 (HOSPITAL ORDER, [facility])  CBC WITH DIFFERENTIAL/PLATELET  COMPREHENSIVE METABOLIC PANEL     Radiology Mr Thoracic Spine Wo Contrast  Result Date: 01/16/2019 CLINICAL DATA:  Mid back pain radiating down to the right leg. Numbness in the bilateral feet EXAM: MRI THORACIC SPINE WITHOUT CONTRAST TECHNIQUE: Multiplanar, multisequence MR imaging of the thoracic spine was performed. No intravenous contrast was administered. COMPARISON:  Chest x-ray 09/24/2018 FINDINGS: Alignment:  Negative for listhesis. Vertebrae: No fracture, evidence of discitis, or bone lesion. Cord: Best seen on sagittal STIR, but also seen on sagittal T2 weighted imaging there is a short segment hyperintensity in the right cord at T10. This area is not seen on axial slices due to slice selection. No cord expansion or  volume loss. Paraspinal and other soft tissues: There is patchy bilateral lung opacity, most likely pneumonia-possibly atypical. Disc levels: Midthoracic disc narrowing with small protrusions at T6-7 to T8-9. These results were called by telephone at the time of interpretation on 01/16/2019 at 7:11 am to Dr. Abigail Butts , who verbally acknowledged these results. IMPRESSION: 1. Short segment right hemi cord signal abnormality at T10 which would usually reflect a demyelinating process. Suggest brain MR and postcontrast imaging. 2. Patchy bilateral lung opacity compatible with pneumonia. COVID-19 should be considered specifically. Electronically Signed   By: Monte Fantasia M.D.   On: 01/16/2019 07:12   Mr Lumbar Spine Wo Contrast  Result Date: 01/16/2019 CLINICAL DATA:  Mid back pain radiating down the right leg EXAM: MRI LUMBAR SPINE WITHOUT CONTRAST TECHNIQUE: Multiplanar, multisequence MR imaging of the lumbar spine was performed. No intravenous contrast was administered. COMPARISON:  None. FINDINGS: Segmentation:  5 lumbar type vertebrae based on prior KUB Alignment:  Normal Vertebrae:  No fracture, evidence of discitis, or bone lesion. Conus medullaris and cauda equina: Conus extends to the L1-2 level. Conus and cauda equina appear normal. Paraspinal and other soft tissues: Negative Disc levels: L4-5: Disc narrowing and bulging with a central up turning protrusion. No impingement. L5-S1: Disc narrowing and bulging with endplate degeneration. No neural compression. 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 Electronically Signed   By: Monte Fantasia M.D.   On: 01/16/2019 05:19    Procedures Procedures (including critical care time)  Medications Ordered in ED Medications  oxyCODONE-acetaminophen (PERCOCET/ROXICET) 5-325 MG per tablet 1 tablet (1 tablet Oral Given 01/15/19 2046)  LORazepam (ATIVAN) tablet 1 mg (1 mg Oral Given  01/16/19 0334)     Initial Impression / Assessment and Plan / ED Course  I have reviewed the triage vital signs and the nursing notes.  Pertinent labs & imaging results that were available during my care of the patient were reviewed by me and considered in my medical decision making (see chart for details).  Clinical Course as of Jan 15 714  Tue Jan 16, 2019  0543 No findings on the L-spine MRI. Will obtain T-spine in addition as neurologic symptoms persist.    [HM]  0544 The patient was discussed with and seen by Dr. Roxanne Mins who agrees with the treatment plan.    [HM]  0713 Discussed with Radiology.  Pt with isolated T-10 lesion. Concern for MS.  Will need neuro consult and admission for MS work-up.  Of concern, MRI shows bilateral ground glass opacities concerning for COVID.  Test ordered and pt placed on precautions.     [HM]    Clinical Course User Index [HM] Shyane Fossum, Jarrett Soho, [place]       Patient presents with acute back pain, gait disturbance and sensory deficit right greater than left.  No history of same.  We will have patient denies vaginal urinary symptoms.  Will obtain MRI.   Pain more under control with oxycodone.   6:43 AM Pt continues to have pain.  Will check MRI Thoracic as MRI Lumbar was negative. UA with questionable URI.  No urinary symptoms, no abd pain.  Urine culture sent.  Will plan to treat with Keflex.  At shift change care was transferred to [facility], PA-C who will follow pending studies, re-evaulate and determine disposition.     Final Clinical Impressions(s) / ED Diagnoses   Final diagnoses:  Acute midline low back pain with right-sided sciatica  Acute cystitis without hematuria    ED Discharge Orders    None       Loni Muse Gwenlyn Perking 01/16/19 1093    Pearlena Ow, Jarrett Soho, PA-C 23/55/73 2202    Delora Fuel, MD 54/27/06 [phone]

## 2019-01-16 NOTE — ED Notes (Signed)
Attempted report x1; Network engineer stated she will have Charge RN call me back.

## 2019-01-17 DIAGNOSIS — S24153A Other incomplete lesion at T7-T10 level of thoracic spinal cord, initial encounter: Secondary | ICD-10-CM

## 2019-01-17 LAB — COMPREHENSIVE METABOLIC PANEL
ALT: 44 U/L (ref 0–44)
AST: 47 U/L — ABNORMAL HIGH (ref 15–41)
Albumin: 3.5 g/dL (ref 3.5–5.0)
Alkaline Phosphatase: 71 U/L (ref 38–126)
Anion gap: 11 (ref 5–15)
BUN: 11 mg/dL (ref 6–20)
CO2: 28 mmol/L (ref 22–32)
Calcium: 8.8 mg/dL — ABNORMAL LOW (ref 8.9–10.3)
Chloride: 97 mmol/L — ABNORMAL LOW (ref 98–111)
Creatinine, Ser: 1.06 mg/dL — ABNORMAL HIGH (ref 0.44–1.00)
GFR calc Af Amer: >60 mL/min (ref >60)
GFR calc non Af Amer: >60 mL/min (ref >60)
Glucose, Bld: 149 mg/dL — ABNORMAL HIGH (ref 70–99)
Potassium: 3.3 mmol/L — ABNORMAL LOW (ref 3.5–5.1)
Sodium: 136 mmol/L (ref 135–145)
Total Bilirubin: 1 mg/dL (ref 0.3–1.2)
Total Protein: 7 g/dL (ref 6.5–8.1)

## 2019-01-17 LAB — CBC WITH DIFFERENTIAL/PLATELET
Abs Immature Granulocytes: 0.02 10*3/uL (ref 0.00–0.07)
Basophils Absolute: 0 10*3/uL (ref 0.0–0.1)
Basophils Relative: 0 %
Eosinophils Absolute: 0 10*3/uL (ref 0.0–0.5)
Eosinophils Relative: 0 %
HCT: 38.8 % (ref 36.0–46.0)
Hemoglobin: 12.9 g/dL (ref 12.0–15.0)
Immature Granulocytes: 1 %
Lymphocytes Relative: 45 %
Lymphs Abs: 2 10*3/uL (ref 0.7–4.0)
MCH: 29.9 pg (ref 26.0–34.0)
MCHC: 33.2 g/dL (ref 30.0–36.0)
MCV: 89.8 fL (ref 80.0–100.0)
Monocytes Absolute: 0.2 10*3/uL (ref 0.1–1.0)
Monocytes Relative: 5 %
Neutro Abs: 2.2 10*3/uL (ref 1.7–7.7)
Neutrophils Relative %: 49 %
Platelets: 176 10*3/uL (ref 150–400)
RBC: 4.32 MIL/uL (ref 3.87–5.11)
RDW: 13.2 % (ref 11.5–15.5)
WBC: 4.4 10*3/uL (ref 4.0–10.5)
nRBC: 0 % (ref 0.0–0.2)

## 2019-01-17 LAB — MAGNESIUM: Magnesium: 2 mg/dL (ref 1.7–2.4)

## 2019-01-17 LAB — FERRITIN: Ferritin: 486 ng/mL — ABNORMAL HIGH (ref 11–307)

## 2019-01-17 LAB — C-REACTIVE PROTEIN: CRP: 0.9 mg/dL (ref <1.0)

## 2019-01-17 LAB — D-DIMER, QUANTITATIVE: D-Dimer, Quant: 0.6 ug/mL-FEU — ABNORMAL HIGH (ref 0.00–0.50)

## 2019-01-17 LAB — CK: Total CK: 141 U/L (ref 38–234)

## 2019-01-17 LAB — TRIGLYCERIDES: Triglycerides: 80 mg/dL (ref <150)

## 2019-01-17 LAB — PHOSPHORUS: Phosphorus: 3 mg/dL (ref 2.5–4.6)

## 2019-01-17 MED ORDER — POTASSIUM CHLORIDE CRYS ER 20 MEQ PO TBCR
40.0000 meq | EXTENDED_RELEASE_TABLET | Freq: Once | ORAL | Status: AC
  Administered 2019-01-17: 40 meq via ORAL
  Filled 2019-01-17: qty 2

## 2019-01-17 MED ORDER — MELATONIN 3 MG PO TABS
6.0000 mg | ORAL_TABLET | Freq: Every day | ORAL | Status: DC
  Administered 2019-01-17 – 2019-01-19 (×3): 6 mg via ORAL
  Filled 2019-01-17 (×4): qty 2

## 2019-01-17 MED ORDER — NON FORMULARY: 5.0000 mg | Freq: Every evening | Status: DC | PRN

## 2019-01-17 MED ORDER — METHYLPREDNISOLONE SODIUM SUCC 125 MG IJ SOLR
80.0000 mg | Freq: Two times a day (BID) | INTRAMUSCULAR | Status: DC
  Administered 2019-01-17 – 2019-01-20 (×7): 80 mg via INTRAVENOUS
  Filled 2019-01-17 (×8): qty 2

## 2019-01-17 MED ORDER — METHYLPREDNISOLONE SODIUM SUCC 125 MG IJ SOLR: 80.0000 mg | Freq: Two times a day (BID) | INTRAMUSCULAR | Status: DC

## 2019-01-17 MED ORDER — ENOXAPARIN SODIUM 40 MG/0.4ML ~~LOC~~ SOLN
40.0000 mg | SUBCUTANEOUS | Status: DC
  Administered 2019-01-17 – 2019-01-19 (×3): 40 mg via SUBCUTANEOUS
  Filled 2019-01-17 (×3): qty 0.4

## 2019-01-17 NOTE — Progress Notes (Signed)
PROGRESS NOTE    [name]  WEX:[id number] DOB: 1971-05-02 DOA: 01/15/2019 PCP: Burnis Medin, MD Brief Narrative: 48 y.o. female with medical history significant of hypertension, keloids, and remote history of teratoma s/p removal; who presents with complaints of lower extremity numbness and weakness.  Symptoms started yesterday morning when she woke up and had some trouble getting out of bed and walking.  Complained of lower back pain with pain shooting down to her right foot.  Denies any recent falls or trauma.  She reports that the sensation in her right leg was decreased most notably in the foot and also noted some decreased sensation along the left leg.  Due to difficulty in walking she came into the emergency department for further evaluation.  Denies any fever, sick contacts, cough, shortness of breath, chest pain, nausea, vomiting,  abdominal pain, diarrhea, or dysuria symptoms.  She reports that she may have come in contact with somebody at the grocery store and that was COVID positive or possibly from 1 of her [employer].  Denies any history of MS to her knowledge in her family.  ED Course: Upon admission to the emergency department patient was seen to have a temperature up to 99.9 F, and all other vital signs maintained.  Labs revealed relatively normal CBC and CMP.  COVID-19 screening positive.  Chest x-ray noted airspace opacities on the left concerning for pneumonia.  MRI imaging of the thoracic and lumbar spine revealed hemicord abnormality of T10, bilateral pulmonary infiltrates, and degenerative changes of the L4-S1.  Neurology consulted for MRI findings.  Patient was started on antibiotics of Rocephin and azithromycin  Assessment & Plan:   Principal Problem:   Other incomplete lesion at T10 level of thoracic spinal cord, initial encounter [facility]) Active Problems:   Lower extremity weakness   Pneumonia due to COVID-19 virus  Lower extremity weakness, abnormality of  thoracic spine: MRI of the brain shows no evidence of demyelinating lesions.  Right of the C-spine T2 punctuate focus of hyperintensity at the C4-C5 level.  MRI of the thoracic spine no enhancing lesions on the thoracic cord intramedullary lesion at T10 identified previously appears nonacute.  Bilateral pulmonary opacities right greater than left with right pleural effusion.  Patient presented with complaints of weakness on the right leg worse than left. Neurology following recommends steroids IV for 3 days to see if there is any improvement.  They are not planning any more inpatient work-up at this time.  They are agreeable to patient being transferred to [facility] Questioning transverse myelitis versus MS.  Pneumonia due to COVID-19: Imaging studies revealing bilateral infiltrates concerning for pneumonia.  COVID 19 testing positive.  Lactic acid within normal limits.  Patient currently maintaining O2 saturations on room air.  -Droplet precautions -Follow-up inflammatory markers I have stopped her empiric antibiotics. -Albuterol inhaler as needed -Vitamin C and zinc -Antitussives as needed  DVT prophylaxis:lovenox Code Status:full Family Communication:  Cussed with patient Disposition Plan: Possible discharge home in 2 to 3 days Consults called: Neurology     Estimated body mass index is 33.91 kg/m as calculated from the following:   Height as of 09/24/18: 6' (1.829 m).   Weight as of 09/24/18: 113.4 kg.    Subjective:  Patient resting in bed reports that she walked to the bathroom still feels weak and feels same as the way she came in with.  No nausea vomiting headache changes with her vision Objective: Vitals:   01/16/19 0921 01/16/19 1540 01/16/19  2106 01/17/19 0851  BP: 132/72 (!) 124/94 108/75 93/64  Pulse: 90 94  83  Resp: 20 20 18 17   Temp:  99.9 F (37.7 C) 99.1 F (37.3 C) 98.9 F (37.2 C)  TempSrc:  Oral Oral Oral  SpO2: 99% 94% 94% 93%    Intake/Output Summary (Last  24 hours) at 01/17/2019 1206 Last data filed at 01/17/2019 0350 Gross per 24 hour  Intake 240 ml  Output 700 ml  Net -460 ml   There were no vitals filed for this visit.  Examination:  General exam: Appears calm and comfortable  Respiratory system: Clear to auscultation. Respiratory effort normal. Cardiovascular system: S1 & S2 heard, RRR. No JVD, murmurs, rubs, gallops or clicks. No pedal edema. Gastrointestinal system: Abdomen is nondistended, soft and nontender. No organomegaly or masses felt. Normal bowel sounds heard. Central nervous system: Alert and oriented. No focal neurological deficits. Extremities moves all her extremities Skin: No rashes, lesions or ulcers Psychiatry: Judgement and insight appear normal. Mood & affect appropriate.     Data Reviewed: I have personally reviewed following labs and imaging studies  CBC: Recent Labs  Lab 01/16/19 0712 01/17/19 0958  WBC 5.3 4.4  NEUTROABS 2.4 2.2  HGB 13.6 12.9  HCT 40.3 38.8  MCV 90.2 89.8  PLT 169 322   Basic Metabolic Panel: Recent Labs  Lab 01/16/19 0712 01/17/19 0958  NA 137 136  K 3.8 3.3*  CL 101 97*  CO2 24 28  GLUCOSE 108* 149*  BUN 14 11  CREATININE 0.85 1.06*  CALCIUM 8.8* 8.8*  MG  --  2.0  PHOS  --  3.0   GFR: CrCl cannot be calculated (Unknown ideal weight.). Liver Function Tests: Recent Labs  Lab 01/16/19 0712 01/17/19 0958  AST 38 47*  ALT 39 44  ALKPHOS 68 71  BILITOT 1.1 1.0  PROT 7.0 7.0  ALBUMIN 3.7 3.5   No results for input(s): LIPASE, AMYLASE in the last 168 hours. No results for input(s): AMMONIA in the last 168 hours. Coagulation Profile: No results for input(s): INR, PROTIME in the last 168 hours. Cardiac Enzymes: Recent Labs  Lab 01/17/19 0958  CKTOTAL 141   BNP (last 3 results) No results for input(s): PROBNP in the last 8760 hours. HbA1C: No results for input(s): HGBA1C in the last 72 hours. CBG: No results for input(s): GLUCAP in the last 168  hours. Lipid Profile: Recent Labs    01/16/19 1100 01/17/19 0958  TRIG 98 80   Thyroid Function Tests: No results for input(s): TSH, T4TOTAL, FREET4, T3FREE, THYROIDAB in the last 72 hours. Anemia Panel: Recent Labs    01/16/19 0951 01/17/19 0958  FERRITIN 499* 486*   Sepsis Labs: Recent Labs  Lab 01/16/19 0951 01/16/19 1125  PROCALCITON <0.10  --   LATICACIDVEN  --  1.1    Recent Results (from the past 240 hour(s))  Urine culture     Status: Abnormal   Collection Time: 01/16/19  2:54 AM  Result Value Ref Range Status   Specimen Description URINE, CLEAN CATCH  Final   Special Requests   Final    NONE Performed at [facility], [place] [address], [place], [place] [address]    Culture MULTIPLE SPECIES PRESENT, SUGGEST RECOLLECTION (A)  Final   Report Status 01/16/2019 FINAL  Final  SARS Coronavirus 2 (CEPHEID- Performed in [place] hospital lab), Hosp Order     Status: Abnormal   Collection Time: 01/16/19  7:12 AM  Result Value Ref Range Status   SARS Coronavirus 2 POSITIVE (A) NEGATIVE Final    Comment: RESULT CALLED TO, READ BACK BY AND VERIFIED WITH: RN N ZOHBI [id number] (NOTE) If result is NEGATIVE SARS-CoV-2 target nucleic acids are NOT DETECTED. The SARS-CoV-2 RNA is generally detectable in upper and lower  respiratory specimens during the acute phase of infection. The lowest  concentration of SARS-CoV-2 viral copies this assay can detect is 250  copies / mL. A negative result does not preclude SARS-CoV-2 infection  and should not be used as the sole basis for treatment or other  patient management decisions.  A negative result may occur with  improper specimen collection / handling, submission of specimen other  than nasopharyngeal swab, presence of viral mutation(s) within the  areas targeted by this assay, and inadequate number of viral copies  (<250 copies / mL). A negative result must be combined with clinical  observations, patient  history, and epidemiological information. If result is POSITIVE SARS-CoV-2 target nucleic acids are DETECTED. The SARS-C oV-2 RNA is generally detectable in upper and lower  respiratory specimens during the acute phase of infection.  Positive  results are indicative of active infection with SARS-CoV-2.  Clinical  correlation with patient history and other diagnostic information is  necessary to determine patient infection status.  Positive results do  not rule out bacterial infection or co-infection with other viruses. If result is PRESUMPTIVE POSTIVE SARS-CoV-2 nucleic acids MAY BE PRESENT.   A presumptive positive result was obtained on the submitted specimen  and confirmed on repeat testing.  While 2019 novel coronavirus  (SARS-CoV-2) nucleic acids may be present in the submitted sample  additional confirmatory testing may be necessary for epidemiological  and / or clinical management purposes  to differentiate between  SARS-CoV-2 and other Sarbecovirus currently known to infect humans.  If clinically indicated additional testing with an alternate test  methodology [phone]) is advised.  The SARS-CoV-2 RNA is generally  detectable in upper and lower respiratory specimens during the acute  phase of infection. The expected result is Negative. Fact Sheet for Patients:  StrictlyIdeas.no Fact Sheet for Healthcare Providers: BankingDealers.co.za This test is not yet approved or cleared by the [place] FDA and has been authorized for detection and/or diagnosis of SARS-CoV-2 by FDA under an Emergency Use Authorization (EUA).  This EUA will remain in effect (meaning this test can be used) for the duration of the COVID-19 declaration under Section 564(b)(1) of the Act, 21 U.S.C. section 360bbb-3(b)(1), unless the authorization is terminated or revoked sooner. Performed at [facility], [place] [address], [place], [place] [address]           Radiology Studies: Mr Jeri Cos And Wo Contrast  Result Date: 01/16/2019 CLINICAL DATA:  Spinal cord abnormality on thoracic MRI, T10 level, possible demyelinating lesion. Assess for disease elsewhere in CNS. Symptoms are low back pain and leg weakness. EXAM: MRI HEAD WITHOUT AND WITH CONTRAST TECHNIQUE: Multiplanar, multiecho pulse sequences of the brain and surrounding structures were obtained without and with intravenous contrast. CONTRAST:  Gadavist 10 mL. COMPARISON:  MRI thoracic spine earlier today. MRI cervical spine reported separately. FINDINGS: The patient was unable to remain motionless for the exam. Small or subtle lesions could be overlooked. Brain: No evidence for acute infarction, hemorrhage, mass lesion, hydrocephalus, or extra-axial fluid. Normal for age cerebral volume. No white matter disease. Specifically no demyelinating lesions. Post infusion, no abnormal enhancement of the brain or meninges. Vascular:  Flow voids are maintained. Skull and upper cervical spine: Normal marrow signal. 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 Sinuses/Orbits: Negative. Other: None. IMPRESSION: Motion degraded exam demonstrates no acute or focal intracranial abnormality. Specifically, no evidence for active or occult demyelinating lesion in the cerebral, cerebellar or brainstem white matter to suggest multiple sclerosis, CNS infection, or other significant abnormality. Electronically Signed   By: Staci Righter M.D.   On: 01/16/2019 15:00   Mr Thoracic Spine Wo Contrast  Result Date: 01/16/2019 CLINICAL DATA:  Mid back pain radiating down to the right leg. Numbness in the bilateral feet EXAM: MRI THORACIC SPINE WITHOUT CONTRAST TECHNIQUE: Multiplanar, multisequence MR imaging of the thoracic spine was performed. No intravenous contrast was administered. COMPARISON:  Chest x-ray 09/24/2018 FINDINGS: Alignment:  Negative for listhesis. Vertebrae: No fracture, evidence of discitis, or  bone lesion. Cord: Best seen on sagittal STIR, but also seen on sagittal T2 weighted imaging there is a short segment hyperintensity in the right cord at T10. This area is not seen on axial slices due to slice selection. No cord expansion or volume loss. Paraspinal and other soft tissues: There is patchy bilateral lung opacity, most likely pneumonia-possibly atypical. Disc levels: Midthoracic disc narrowing with small protrusions at T6-7 to T8-9. These results were called by telephone at the time of interpretation on 01/16/2019 at 7:11 am to Dr. Abigail Butts , who verbally acknowledged these results. IMPRESSION: 1. Short segment right hemi cord signal abnormality at T10 which would usually reflect a demyelinating process. Suggest brain MR and postcontrast imaging. 2. Patchy bilateral lung opacity compatible with pneumonia. COVID-19 should be considered specifically. Electronically Signed   By: Monte Fantasia M.D.   On: 01/16/2019 07:12   Mr Thoracic Spine W Contrast  Result Date: 01/16/2019 CLINICAL DATA:  Continued surveillance abnormal noncontrast thoracic MRI. History of back pain and BILATERAL leg weakness. Patient is COVID-19 positive. EXAM: MRI THORACIC SPINE WITH CONTRAST TECHNIQUE: Multiplanar, multisequence MR imaging of the thoracic spine was performed following the administration of intravenous contrast. COMPARISON:  MRI thoracic spine earlier today. FINDINGS: Alignment:  Standard. Vertebrae: No fracture, evidence of discitis, or bone lesion. Cord: Post infusion imaging demonstrates no abnormal enhancement of the thoracic cord at the T10 level, nor elsewhere. Paraspinal and other soft tissues: Patchy BILATERAL lung opacities, greater on the RIGHT, with a RIGHT pleural effusion. Disc levels: Unchanged from priors.  Small protrusions at T6-7 and T8-9. IMPRESSION: No enhancing lesions are seen in the thoracic cord. The intramedullary lesion at T10 identified previously appears nonacute. BILATERAL  pulmonary opacities RIGHT greater than LEFT, with RIGHT pleural effusion, similar to priors. Electronically Signed   By: Staci Righter M.D.   On: 01/16/2019 15:23   Mr Lumbar Spine Wo Contrast  Result Date: 01/16/2019 CLINICAL DATA:  Mid back pain radiating down the right leg EXAM: MRI LUMBAR SPINE WITHOUT CONTRAST TECHNIQUE: Multiplanar, multisequence MR imaging of the lumbar spine was performed. No intravenous contrast was administered. COMPARISON:  None. FINDINGS: Segmentation:  5 lumbar type vertebrae based on prior KUB Alignment:  Normal Vertebrae:  No fracture, evidence of discitis, or bone lesion. Conus medullaris and cauda equina: Conus extends to the L1-2 level. Conus and cauda equina appear normal. Paraspinal and other soft tissues: Negative Disc levels: L4-5: Disc narrowing and bulging with a central up turning protrusion. No impingement. L5-S1: Disc narrowing and bulging with endplate degeneration. No neural compression. IMPRESSION: L4-5 and L5-S1 moderate disc degeneration without neural compression to explain right leg  symptoms. Electronically Signed   By: Monte Fantasia M.D.   On: 01/16/2019 05:19   Mr Cervical Spine W Or Wo Contrast  Result Date: 01/16/2019 CLINICAL DATA:  Low back pain and leg weakness. Abnormal thoracic MRI. EXAM: MRI CERVICAL SPINE WITHOUT AND WITH CONTRAST TECHNIQUE: Multiplanar and multiecho pulse sequences of the cervical spine, to include the craniocervical junction and cervicothoracic junction, were obtained without and with intravenous contrast. CONTRAST:  Gadavist 10 mL. COMPARISON:  MRI brain reported separately. MRI thoracic spine earlier today. FINDINGS: Alignment: Reversal of normal cervical lordotic curve. No subluxation. Vertebrae: No fracture, evidence of discitis, or bone lesion. Cord: No demyelinating lesions in the cervical cord are seen. No abnormal postcontrast enhancement. Cord flattening is seen at C4-5 and C5-6 related to spondylosis, described below.  There is a punctate focus of T2 hyperintensity in the LEFT lateral hemicord, C4-5 level, seen on series 35, image 20, which cannot be confirmed on sagittal imaging and is equivocal for demyelinating disease. The lesion is extremely small, no more than 1 mm diameter. Significance uncertain. Posterior Fossa, vertebral arteries, paraspinal tissues: Negative. Disc levels: C2-3: Annular bulge. Slight effacement anterior subarachnoid space and minimal ventral contact with the cord. No impingement. C3-4:  Annular bulge.  No impingement. C4-5: Shallow protrusion. Minimal cord flattening. There may be mild congenital stenosis. No C5 foraminal narrowing. C5-6: Annular bulge. Disc space narrowing. Effacement anterior subarachnoid space with minimal cord flattening greater on the LEFT. LEFT greater than RIGHT C6 foraminal narrowing. C6-7: Central protrusion. Effacement anterior subarachnoid space. Uncinate spurring results in RIGHT C7 foraminal narrowing. C7-T1:  Unremarkable. IMPRESSION: Other than a punctate focus of LEFT hemicord T2 hyperintensity seen only on axial imaging at the C4-5 level, no intrinsic abnormality can be seen in the cervical cord. Findings are not conclusive for demyelinating disease. Multilevel spondylosis, with mild stenosis C4 through C7 related to disc pathology, osseous spurring, and reversal of the normal cervical lordotic curve. See discussion above. Electronically Signed   By: Staci Righter M.D.   On: 01/16/2019 15:18   Dg Chest Port 1 View  Result Date: 01/16/2019 CLINICAL DATA:  Chest tightness EXAM: PORTABLE CHEST 1 VIEW COMPARISON:  September 24, 2018 FINDINGS: There is atelectatic change in the lung bases. There is mild airspace opacity in the left base. The lungs elsewhere are clear. Heart is mildly enlarged with pulmonary vascularity normal. No adenopathy. No bone lesions. IMPRESSION: Airspace opacity in the left base, felt to represent pneumonia. Bibasilar atelectasis also present. Lungs  elsewhere are clear. Stable cardiac prominence. No adenopathy evident. Electronically Signed   By: Lowella Grip III M.D.   On: 01/16/2019 07:50        Scheduled Meds:  irbesartan  300 mg Oral Daily   And   amLODipine  5 mg Oral Daily   And   hydrochlorothiazide  25 mg Oral Daily   sodium chloride flush  10-40 mL Intracatheter Q12H   sodium chloride flush  3 mL Intravenous Q12H   vitamin C  500 mg Oral Daily   zinc sulfate  220 mg Oral Daily   Continuous Infusions:   LOS: 1 day     Georgette Shell, MD [facility]  If 7PM-7AM, please contact night-coverage [email or web address] Password [email or web address] 01/17/2019, 12:06 PM

## 2019-01-17 NOTE — Evaluation (Signed)
Physical Therapy Evaluation Patient Details Name: [name] MRN: [id number] DOB: 11/21/1970 Today's Date: 01/17/2019   History of Present Illness  [name] is a 48 y.o. female with past medical history of hypertension, obesity presents the ED with 2-day history of bilateral lower extremity weakness and numbness. MRI of her L-spine and T-spine was performed in the ER which demonstrated a T2 signal lesion in the T-spine at level of T10.  Incidentally, MRI of her T-spine showed bilateral lung opacities consistent with pneumonia.  COVID-19 test was performed and was positive  Clinical Impression   Pt admitted with above diagnosis. Pt currently with functional limitations due to the deficits listed below (see PT Problem List). Independent prior to admission; Presents with fatigue, general malaise, back pain, decr activity tolerance; I anticipate a solid functional recovery as she gets medically better; Will consider RW versus cane, especially if that helps with pain and activity tolerance;  Pt will benefit from skilled PT to increase their independence and safety with mobility to allow discharge to the venue listed below.       Follow Up Recommendations Outpatient PT;Supervision/Assistance - 24 hour;Other (comment)(would like for near 24 hour assistance to be available to pt)  The potential need for Outpatient PT can be addressed at PCP or Neuro follow-up appointments.     Equipment Recommendations  Rolling walker with 5" wheels;Cane;Other (comment)(will continue to discern assistive device, if she needs one )    Recommendations for Other Services Other (comment)(will consider OT, depending on Neuro workup, and progress)     Precautions / Restrictions Precautions Precautions: Back(for comfort) Precaution Comments: Pt indicated she gets up via sidelie to sit often due to back pain      Mobility  Bed Mobility Overal bed mobility: Needs Assistance Bed Mobility: Supine to Sit      Supine to sit: Min guard;HOB elevated     General bed mobility comments: Slow moving but not needing physical assist  Transfers Overall transfer level: Needs assistance Equipment used: 1 person hand held assist Transfers: Sit to/from Stand Sit to Stand: Min assist         General transfer comment: [name] reached out for hand held assist; min assist to steady at intial stand  Ambulation/Gait Ambulation/Gait assistance: Min assist Gait Distance (Feet): 20 Feet Assistive device: 1 person hand held assist Gait Pattern/deviations: Decreased step length - right;Decreased step length - left;Decreased stride length Gait velocity: Very slow   General Gait Details: Slow, short steps; Overall decr activity tolerance due to general malaise; shivering noted  Stairs            Wheelchair Mobility    Modified Rankin (Stroke Patients Only)       Balance Overall balance assessment: No apparent balance deficits (not formally assessed)                                           Pertinent Vitals/Pain Pain Assessment: Faces Faces Pain Scale: Hurts even more Pain Location: general malaise, fever Pain Descriptors / Indicators: (general malaise) Pain Intervention(s): Monitored during session    Home Living Family/patient expects to be discharged to:: Private residence Living Arrangements: Spouse/significant other;Children Available Help at Discharge: Family Type of Home: Other(Comment)(townhome)       Home Layout: Two level;Bed/bath upstairs Home Equipment: None      Prior Function Level of Independence: Independent  Comments: Works at a company called [employer]        Extremity/Trunk Assessment   Upper Extremity Assessment Upper Extremity Assessment: Generalized weakness(and generally achy)    Lower Extremity Assessment Lower Extremity Assessment: Generalized weakness(Generally achy; reports RLE weakness, did  not opt to MMT due to general malaise)       Communication   Communication: No difficulties  Cognition Arousal/Alertness: Awake/alert Behavior During Therapy: WFL for tasks assessed/performed Overall Cognitive Status: Within Functional Limits for tasks assessed                                        General Comments General comments (skin integrity, edema, etc.): Reports very cold, shivering; no gross DOE noted with in room amb; walked on room air    Exercises     Assessment/Plan    PT Assessment Patient needs continued PT services  PT Problem List Decreased strength;Decreased range of motion;Decreased activity tolerance;Decreased balance;Decreased mobility;Decreased knowledge of use of DME;Cardiopulmonary status limiting activity       PT Treatment Interventions DME instruction;Gait training;Stair training;Functional mobility training;Therapeutic activities;Therapeutic exercise;Patient/family education    PT Goals (Current goals can be found in the Care Plan section)  Acute Rehab PT Goals Patient Stated Goal: Very much wants to get better PT Goal Formulation: With patient Time For Goal Achievement: 01/31/19 Potential to Achieve Goals: Good    Frequency Min 3X/week   Barriers to discharge Inaccessible home environment Flight of steps to access bedroom; not sure if she has the option to stay downstairs (or that she would want to stay downstairs)    Co-evaluation               AM-PAC PT "6 Clicks" Mobility  Outcome Measure Help needed turning from your back to your side while in a flat bed without using bedrails?: None Help needed moving from lying on your back to sitting on the side of a flat bed without using bedrails?: None Help needed moving to and from a bed to a chair (including a wheelchair)?: A Little Help needed standing up from a chair using your arms (e.g., wheelchair or bedside chair)?: A Little Help needed to walk in hospital room?: A  Little Help needed climbing 3-5 steps with a railing? : A Little 6 Click Score: 20    End of Session   Activity Tolerance: Patient limited by fatigue Patient left: in bed;with call bell/phone within reach Nurse Communication: Mobility status PT Visit Diagnosis: Unsteadiness on feet (R26.81);Other abnormalities of gait and mobility (R26.89)    Time: 7209-4709 PT Time Calculation (min) (ACUTE ONLY): 23 min   Charges:   PT Evaluation $PT Eval Moderate Complexity: 1 Mod PT Treatments $Gait Training: 8-22 mins        Roney Marion, PT  Acute Rehabilitation Services Pager [phone] Office [phone]   [place] 01/17/2019, 4:02 PM

## 2019-01-17 NOTE — Progress Notes (Signed)
Spoke to the patient over the phone to limit exposure as patient is COVID positive. States that she is walking although feels weaker than normal.  Her right leg weakness has not gotten any worse is about the same.  Tingling also remains about the same.  I reviewed her MRI brain with contrast which showed no demyelinating lesions.  We also obtain MRI C-spine which showed punctate left hemi-cord signal abnormality at C4-C5.  Reviewed her MRI T-spine obtained with contrast (prior T spine was without contrast), T10 hemicord lesion does not enhance.  I discussed with Dr. Jacki Cones regarding steroids and said that this is been safe when administer other COVID patient's.  We discussed on using a lower dose than normal for transverse myelitis-and will be starting at 80 mg every 12 for 3 days.  Discussed with the patient at regarding her MRI brain and C-spine and T-spine findings.  She seems agreement to the plan.  Also discussed the option of doing a lumbar puncture, however this to be done as an outpatient which I think is reasonable to be done after she completes treatment recovers from her COVID related pneumonia.  However, if patient's neurological symptoms worsen then I do think we need to do it this as an inpatient.  Impression Right leg weakness 2/2 T10 hemicord lesion, nonenhancing and possible C4-C5 lesion.  D/D postinfectious myelitis versus systemic autoimmune condition such MS, (rare conditions such as drop mets, intramedullary tumor, spinal vascular lesions remote possibility)     Recommendations F/U ANA and ANCA, dsDNA, RF, ANA panel, antiphospholipid antibodies, CRP ordered F/U Anti-aqauporin 4 antibody ordered Defer LP for now, can obtain as outpatient unless patient neurological symptoms worsens Low-dose steroids PT OT  Outpatient neurology follow-up

## 2019-01-18 DIAGNOSIS — R29898 Other symptoms and signs involving the musculoskeletal system: Secondary | ICD-10-CM

## 2019-01-18 LAB — HIV ANTIBODY (ROUTINE TESTING W REFLEX): HIV Screen 4th Generation wRfx: NONREACTIVE

## 2019-01-18 LAB — SJOGRENS SYNDROME-B EXTRACTABLE NUCLEAR ANTIBODY: SSB (La) (ENA) Antibody, IgG: <0.2 AI (ref 0.0–0.9)

## 2019-01-18 LAB — LEGIONELLA PNEUMOPHILA SEROGP 1 UR AG: L. pneumophila Serogp 1 Ur Ag: NEGATIVE

## 2019-01-18 LAB — ANCA TITERS
Atypical P-ANCA titer: <1:20 {titer}
C-ANCA: <1:20 {titer}
P-ANCA: <1:20 {titer}

## 2019-01-18 LAB — INTERLEUKIN-6, PLASMA: Interleukin-6, Plasma: 17.2 pg/mL — ABNORMAL HIGH (ref 0.0–12.2)

## 2019-01-18 LAB — RHEUMATOID FACTOR: Rheumatoid fact SerPl-aCnc: <10 IU/mL (ref 0.0–13.9)

## 2019-01-18 LAB — LACTATE DEHYDROGENASE: LDH: 233 U/L — ABNORMAL HIGH (ref 98–192)

## 2019-01-18 LAB — ANTI-DNA ANTIBODY, DOUBLE-STRANDED: ds DNA Ab: <1 IU/mL (ref 0–9)

## 2019-01-18 LAB — ANA: Anti Nuclear Antibody (ANA): NEGATIVE

## 2019-01-18 LAB — SJOGRENS SYNDROME-A EXTRACTABLE NUCLEAR ANTIBODY: SSA (Ro) (ENA) Antibody, IgG: <0.2 AI (ref 0.0–0.9)

## 2019-01-18 LAB — ANGIOTENSIN CONVERTING ENZYME: Angiotensin-Converting Enzyme: 28 U/L (ref 14–82)

## 2019-01-18 MED ORDER — SENNOSIDES-DOCUSATE SODIUM 8.6-50 MG PO TABS
2.0000 | ORAL_TABLET | Freq: Two times a day (BID) | ORAL | Status: DC
  Administered 2019-01-18 – 2019-01-20 (×5): 2 via ORAL
  Filled 2019-01-18 (×5): qty 2

## 2019-01-18 MED ORDER — SODIUM CHLORIDE 0.9 % IV SOLN
INTRAVENOUS | Status: DC | PRN
  Administered 2019-01-18 – 2019-01-19 (×2): 250 mL via INTRAVENOUS

## 2019-01-18 MED ORDER — HYDROCODONE-ACETAMINOPHEN 5-325 MG PO TABS
1.0000 | ORAL_TABLET | Freq: Three times a day (TID) | ORAL | Status: DC | PRN
  Administered 2019-01-18 – 2019-01-19 (×3): 1 via ORAL
  Filled 2019-01-18 (×3): qty 1

## 2019-01-18 MED ORDER — FAMOTIDINE IN NACL 20-0.9 MG/50ML-% IV SOLN
20.0000 mg | Freq: Two times a day (BID) | INTRAVENOUS | Status: DC
  Administered 2019-01-18 – 2019-01-19 (×4): 20 mg via INTRAVENOUS
  Filled 2019-01-18 (×5): qty 50

## 2019-01-18 MED ORDER — MENTHOL 3 MG MT LOZG
1.0000 | LOZENGE | OROMUCOSAL | Status: DC | PRN
  Administered 2019-01-18: 3 mg via ORAL
  Filled 2019-01-18: qty 9

## 2019-01-18 MED ORDER — POLYETHYLENE GLYCOL 3350 17 G PO PACK
34.0000 g | PACK | Freq: Once | ORAL | Status: AC
  Administered 2019-01-18: 10:00:00 34 g via ORAL
  Filled 2019-01-18: qty 2

## 2019-01-18 NOTE — Progress Notes (Signed)
Patient arrived on unit last night with vital signs stable and on Room air.  Patient stated she was nervous and worried about everything going on.  She's concerned with loosing feeling in her legs and is also worried about having Covid. We talked for bit and it seem to help for awhile, but she continues to be anxious.  At 0430 she complained of increased lower back pain.  Spoke with MD and Norco was ordered.  Will continue to monitor patient.   Also Patient does have Tylox listed as an allergy, but stated this was a long time ago.  Since then she has taken Oxycodone with no problems.

## 2019-01-18 NOTE — Progress Notes (Signed)
The pt's Spouse was called for a daily update, but there was no answer.

## 2019-01-18 NOTE — Progress Notes (Signed)
Elgergawy, MD was paged regarding the pt's request for something for a sore throat.

## 2019-01-18 NOTE — Progress Notes (Signed)
PROGRESS NOTE    Patient: [name]                            PCP: Burnis Medin, MD                    DOB: [birth year]            DOA: 01/15/2019 OYD:[id number]             DOS: 01/18/2019, 1:33 AM   LOS: 2 days   Date of Service: The patient was seen and examined on 01/18/2019   Brief Narrative:48 y.o.femalewith medical history significant ofhypertension,keloids, and remote history of teratomas/premoval; who presents with complaints of lower extremity numbness and weakness. Symptoms started yesterday morning when she woke up and had some trouble getting out of bed and walking. Complained of lower back pain with pain shooting down to her right foot. Denies any recent falls or trauma. She reports that the sensation in her right leg was decreased most notably in the foot and also noted some decreased sensation along the left leg. Due to difficulty in walking she came into the emergency department for further evaluation. Denies any fever, sick contacts, cough, shortness of breath, chest pain, nausea, vomiting, abdominal pain, diarrhea, or dysuria symptoms. She reports that she may have come in contact with somebody at the grocery store and that was COVID positive or possibly from 1 of her [employer]. Denies any history of MS to her knowledge in her family.  ED Course:Upon admission to the emergency department patient was seen to have a temperature up to 99.9 F, and all other vital signs maintained. Labs revealed relatively normal CBC and CMP. COVID-19 screening positive. Chest x-ray noted airspace opacities on the left concerning for pneumonia. MRI imaging of the thoracic and lumbar spine revealed hemicord abnormality of T10, bilateral pulmonary infiltrates, and degenerative changes of the L4-S1. Neurology consulted for MRI findings. Patient was started on antibiotics of Rocephin and azithromycin  Principal Problem:    Pneumonia due to COVID-19 virus Other  incomplete lesion at T10 level of thoracic spinal cord, initial encounter [facility]) Active Problems:   Lower extremity weakness    Patient has arrived to [facility] treatment of pneumonia due to SARS COVID positive: Currently afebrile, normotensive, satting 91% on room air -Labs has been ordered,  -Empiric antibiotics  -Continue steroids - vitamin C/zinc - Monitor closely - Holding initiation of Remdisvir and Actemra for tonight till further evaluation, and if the patient deteriorates further  Patient was seen and evaluated by neurology -status post MRI of the brain and C-spine nonspecific lesions, recommended 3 days of IV steroids -further intervention, symptoms continued recommended outpatient LP    Antimicrobials:  Anti-infectives (From admission, onward)   Start     Dose/Rate Route Frequency Ordered Stop   01/17/19 1600  cefTRIAXone (ROCEPHIN) 1 g in sodium chloride 0.9 % 100 mL IVPB  Status:  Discontinued     1 g 200 mL/hr over 30 Minutes Intravenous Every 24 hours 01/16/19 1314 01/16/19 2122   01/17/19 1000  azithromycin (ZITHROMAX) tablet 250 mg  Status:  Discontinued     250 mg Oral Daily 01/16/19 1314 01/16/19 2122   01/16/19 1000  cefTRIAXone (ROCEPHIN) 1 g in sodium chloride 0.9 % 100 mL IVPB     1 g 200 mL/hr over 30 Minutes Intravenous  Once 01/16/19 0949 01/16/19 1648   01/16/19 1000  azithromycin (  ZITHROMAX) 500 mg in sodium chloride 0.9 % 250 mL IVPB     500 mg 250 mL/hr over 60 Minutes Intravenous  Once 01/16/19 0949 01/16/19 1718       Medication:  . irbesartan  300 mg Oral Daily   And  . amLODipine  5 mg Oral Daily   And  . hydrochlorothiazide  25 mg Oral Daily  . enoxaparin (LOVENOX) injection  40 mg Subcutaneous Q24H  . Melatonin  6 mg Oral QHS  . methylPREDNISolone (SOLU-MEDROL) injection  80 mg Intravenous Q12H  . sodium chloride flush  10-40 mL Intracatheter Q12H  . sodium chloride flush  3 mL Intravenous Q12H  . vitamin C  500 mg Oral Daily  . zinc sulfate   220 mg Oral Daily    acetaminophen, albuterol, chlorpheniramine-HYDROcodone, guaiFENesin-dextromethorphan, ondansetron **OR** ondansetron (ZOFRAN) IV, sodium chloride flush     Objective:   Vitals:   01/17/19 1952 01/17/19 2010 01/17/19 2225 01/17/19 2341  BP:  (!) 131/96 (!) 117/98 106/71  Pulse:  (!) 111 93 87  Resp:  18 18 17   Temp: (!) 101.4 F (38.6 C)  98.5 F (36.9 C) 98.5 F (36.9 C)  TempSrc: Oral  Oral Oral  SpO2:  94% 95% 91%  Weight:      Height:        Intake/Output Summary (Last 24 hours) at 01/18/2019 0133 Last data filed at 01/17/2019 1745 Gross per 24 hour  Intake 600 ml  Output 1900 ml  Net -1300 ml   Filed Weights   01/17/19 1729  Weight: 115.6 kg     Examination:     Constitution:  Alert, cooperative, no distress,  Appears calm and comfortable, on room air  LABs:  CBC Latest Ref Rng & Units 01/17/2019 01/16/2019 09/24/2018  WBC 4.0 - 10.5 K/uL 4.4 5.3 8.6  Hemoglobin 12.0 - 15.0 g/dL 12.9 13.6 14.7  Hematocrit 36.0 - 46.0 % 38.8 40.3 44.4  Platelets 150 - 400 K/uL 176 169 279   CMP Latest Ref Rng & Units 01/17/2019 01/16/2019 09/24/2018  Glucose 70 - 99 mg/dL 149(H) 108(H) 102(H)  BUN 6 - 20 mg/dL 11 14 10   Creatinine 0.44 - 1.00 mg/dL 1.06(H) 0.85 0.78  Sodium 135 - 145 mmol/L 136 137 142  Potassium 3.5 - 5.1 mmol/L 3.3(L) 3.8 3.6  Chloride 98 - 111 mmol/L 97(L) 101 102  CO2 22 - 32 mmol/L 28 24 28   Calcium 8.9 - 10.3 mg/dL 8.8(L) 8.8(L) 9.8  Total Protein 6.5 - 8.1 g/dL 7.0 7.0 7.9  Total Bilirubin 0.3 - 1.2 mg/dL 1.0 1.1 0.9  Alkaline Phos 38 - 126 U/L 71 68 101  AST 15 - 41 U/L 47(H) 38 25  ALT 0 - 44 U/L 44 39 25        SIGNED: Deatra James, MD, FACP, FHM. [facility],  Pager [phone]  If 7PM-7AM, please contact night-coverage [email or web address], Password [facility] 01/18/2019, 1:33 AM

## 2019-01-18 NOTE — Progress Notes (Signed)
Occupational Therapy Evaluation Patient Details Name: [name] MRN: [id number] DOB: [birth year] Today's Date: 01/18/2019    History of Present Illness [name] is a 48 y.o. female with past medical history of hypertension, obesity presents the ED with 2-day history of bilateral lower extremity weakness and numbness. MRI of her L-spine and T-spine was performed in the ER which demonstrated a T2 signal lesion in the T-spine at level of T10.  Incidentally, MRI of her T-spine showed bilateral lung opacities consistent with pneumonia.  COVID-19 test was performed and was positive   Clinical Impression   PTA, pt independent with ADL and mobility and worked as a Librarian, academic for a company. Pt presents with RLE weakness and decreased sensation. Increased pain RLE/back with resisted hip flexion. LB ADL are difficult due to back pain. Will follow acutely to complete education regarding ADL retraining with available AE and use of compensatory strategies and education on reducing risk of falls.     Follow Up Recommendations  No OT follow up;Supervision - Intermittent    Equipment Recommendations  3 in 1 bedside commode(to use as a shower seat)    Recommendations for Other Services       Precautions / Restrictions Precautions Precautions: Back      Mobility Bed Mobility               General bed mobility comments: OOB in chair  Transfers Overall transfer level: Needs assistance   Transfers: Sit to/from Stand Sit to Stand: Supervision              Balance Overall balance assessment: Mild deficits observed, not formally tested                                         ADL either performed or assessed with clinical judgement   ADL Overall ADL's : Needs assistance/impaired Eating/Feeding: Independent   Grooming: Set up;Standing   Upper Body Bathing: Set up;Sitting   Lower Body Bathing: Minimal assistance;Sit to/from stand   Upper Body  Dressing : Set up;Sitting   Lower Body Dressing: Minimal assistance;Sit to/from stand   Toilet Transfer: Supervision/safety;RW   Toileting- Water quality scientist and Hygiene: Modified independent       Functional mobility during ADLs: Supervision/safety;Rolling walker;Cueing for safety General ADL Comments: May benefit from AE     Vision         Perception     Praxis      Pertinent Vitals/Pain Pain Assessment: Faces Faces Pain Scale: Hurts even more Pain Location: back Pain Descriptors / Indicators: Discomfort;Aching;Sharp(with resisted hip flexion on R) Pain Intervention(s): Limited activity within patient's tolerance     Hand Dominance Right   Extremity/Trunk Assessment Upper Extremity Assessment Upper Extremity Assessment: Overall WFL for tasks assessed   Lower Extremity Assessment Lower Extremity Assessment: Defer to PT evaluation(RLE weaker than L)   Cervical / Trunk Assessment Cervical / Trunk Assessment: Other exceptions(back discomfort)   Communication Communication Communication: No difficulties   Cognition Arousal/Alertness: Awake/alert Behavior During Therapy: WFL for tasks assessed/performed Overall Cognitive Status: Within Functional Limits for tasks assessed                                     General Comments       Exercises     Shoulder Instructions  Home Living Family/patient expects to be discharged to:: Private residence Living Arrangements: Spouse/significant other;Children Available Help at Discharge: Family Type of Home: House       Home Layout: Two level;Bed/bath upstairs Alternate Level Stairs-Number of Steps: flight Alternate Level Stairs-Rails: Right Bathroom Shower/Tub: Tub/shower unit;Curtain   Biochemist, clinical: Standard Bathroom Accessibility: Yes How Accessible: Accessible via walker Home Equipment: None          Prior Functioning/Environment Level of Independence: Independent         Comments: Works at a company called [employer]        OT Problem List: Decreased strength;Decreased activity tolerance;Impaired balance (sitting and/or standing);Decreased safety awareness;Decreased knowledge of use of DME or AE;Cardiopulmonary status limiting activity;Impaired sensation;Obesity;Pain      OT Treatment/Interventions: Self-care/ADL training;Therapeutic exercise;DME and/or AE instruction;Energy conservation;Therapeutic activities;Patient/family education;Balance training    OT Goals(Current goals can be found in the care plan section) Acute Rehab OT Goals Patient Stated Goal: Very much wants to get better OT Goal Formulation: With patient Time For Goal Achievement: 02/01/19 Potential to Achieve Goals: Good  OT Frequency: Min 3X/week   Barriers to D/C:            Co-evaluation              AM-PAC OT "6 Clicks" Daily Activity     Outcome Measure Help from another person eating meals?: None Help from another person taking care of personal grooming?: A Little Help from another person toileting, which includes using toliet, bedpan, or urinal?: A Little Help from another person bathing (including washing, rinsing, drying)?: A Little Help from another person to put on and taking off regular upper body clothing?: A Little Help from another person to put on and taking off regular lower body clothing?: A Little 6 Click Score: 19   End of Session    Activity Tolerance:   Patient left:    OT Visit Diagnosis: Unsteadiness on feet (R26.81);Other abnormalities of gait and mobility (R26.89);Muscle weakness (generalized) (M62.81);Pain Pain - Right/Left: Right Pain - part of body: Leg;Hip                Time: 1640-1705 OT Time Calculation (min): 25 min Charges:  OT General Charges $OT Visit: 1 Visit OT Evaluation $OT Eval Moderate Complexity: 1 Mod OT Treatments $Self Care/Home Management : 8-22 mins  Maurie Boettcher, OT/L   Acute OT Clinical Specialist [place] Pager [phone] Office [phone]   [facility] 01/18/2019, 5:40 PM

## 2019-01-18 NOTE — Progress Notes (Signed)
PROGRESS NOTE    [name]  MWU:[id number] DOB: 01/07/1971 DOA: 01/15/2019 PCP: Burnis Medin, MD    Brief Narrative:  48 y.o. female with medical history significant of hypertension, keloids, and remote history of teratoma s/p removal, presents with complaints of lower extremity numbness and weakness, she was seen by neurology,  MRI brain with contrast which showed no demyelinating lesions.  We also obtain MRI C-spine which showed punctate left hemi-cord signal abnormality at C4-C5.  Reviewed her MRI T-spine obtained with contrast (prior T spine was without contrast), T10 hemicord lesion does not enhance.  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  Subjective: Patient fever 101.4 yesterday, but she is afebrile overnight, report no significant changes in her weakness, not better, not worse, denies any nausea, vomiting, headache or vision change .  Assessment & Plan:   Principal Problem:   Other incomplete lesion at T10 level of thoracic spinal cord, initial encounter [facility]) Active Problems:   Lower extremity weakness   Pneumonia due to COVID-19 virus  Right leg weakness 2/2 T10 hemicord lesion, nonenhancing and possible C4-C5 lesion. -Neurology input greatly appreciated, differential diagnoses include postinfectious myelitis versus systemic autoimmune condition such MS, (rare conditions such as drop mets, intramedullary tumor, spinal vascular lesions remote possibility) . -F/U ANA and ANCA, dsDNA, RF, ANA panel, antiphospholipid antibodies, CRP ordered - F/U Anti-aqauporin 4 antibody ordered -Discussion with neurology, no LP for now, can be obtained as an outpatient if surgical symptoms worsen. -Continue with PT/OT -Recommendation to continue with IV steroids for total of 3 days, then outpatient follow-up  with neurology.   Pneumonia due to COVID-19 -Chest x-ray significant for bilateral infiltrate concerning for pneumonia -No oxygen requirement currently, no indication for Remdesivire, or Actemra, she is on IV steroids for her neurological findingS. -Continue with vitamin C and zinc  DVT prophylaxis:lovenox Code Status:full Family Communication:  Disussed with patient Disposition Plan: Possible discharge home in 2 to 3 days Consults called: Neurology     Estimated body mass index is 34.56 kg/m as calculated from the following:   Height as of this encounter: 6' (1.829 m).   Weight as of this encounter: 115.6 kg.     Objective: Vitals:   01/18/19 0433 01/18/19 0730 01/18/19 0800 01/18/19 1000  BP:   (!) 133/94 104/74  Pulse: 82 79 81 83  Resp: (!) 21 11 18 16   Temp:   97.7 F (36.5 C)   TempSrc:   Oral   SpO2: 93% 92% 94% 92%  Weight:      Height:        Intake/Output Summary (Last 24 hours) at 01/18/2019 1206 Last data filed at 01/18/2019 1000 Gross per 24 hour  Intake 473.47 ml  Output 600 ml  Net -126.53 ml   Filed Weights   01/17/19 1729  Weight: 115.6 kg    Examination:  Awake Alert, Oriented X 3,  Normal affect Symmetrical Chest wall movement, Good air movement bilaterally, CTAB RRR,No Gallops,Rubs or new Murmurs, No Parasternal Heave +ve B.Sounds, Abd Soft, No tenderness, No rebound - guarding or rigidity. No Cyanosis, Clubbing or edema, No new Rash or bruise   .     Data Reviewed: I have personally reviewed following labs and imaging studies  CBC: Recent Labs  Lab 01/16/19 0712 01/17/19 0958  WBC 5.3  4.4  NEUTROABS 2.4 2.2  HGB 13.6 12.9  HCT 40.3 38.8  MCV 90.2 89.8  PLT 169 474   Basic Metabolic Panel: Recent Labs  Lab 01/16/19 0712 01/17/19 0958  NA 137 136  K 3.8 3.3*  CL 101 97*  CO2 24 28  GLUCOSE 108* 149*  BUN 14 11  CREATININE 0.85 1.06*  CALCIUM 8.8* 8.8*  MG  --  2.0  PHOS  --  3.0   GFR: Estimated Creatinine  Clearance: 92.3 mL/min (A) (by C-G formula based on SCr of 1.06 mg/dL (H)). Liver Function Tests: Recent Labs  Lab 01/16/19 0712 01/17/19 0958  AST 38 47*  ALT 39 44  ALKPHOS 68 71  BILITOT 1.1 1.0  PROT 7.0 7.0  ALBUMIN 3.7 3.5   No results for input(s): LIPASE, AMYLASE in the last 168 hours. No results for input(s): AMMONIA in the last 168 hours. Coagulation Profile: No results for input(s): INR, PROTIME in the last 168 hours. Cardiac Enzymes: Recent Labs  Lab 01/17/19 0958  CKTOTAL 141   BNP (last 3 results) No results for input(s): PROBNP in the last 8760 hours. HbA1C: No results for input(s): HGBA1C in the last 72 hours. CBG: No results for input(s): GLUCAP in the last 168 hours. Lipid Profile: Recent Labs    01/16/19 1100 01/17/19 0958  TRIG 98 80   Thyroid Function Tests: No results for input(s): TSH, T4TOTAL, FREET4, T3FREE, THYROIDAB in the last 72 hours. Anemia Panel: Recent Labs    01/16/19 0951 01/17/19 0958  FERRITIN 499* 486*   Sepsis Labs: Recent Labs  Lab 01/16/19 0951 01/16/19 1125  PROCALCITON <0.10  --   LATICACIDVEN  --  1.1    Recent Results (from the past 240 hour(s))  Urine culture     Status: Abnormal   Collection Time: 01/16/19  2:54 AM  Result Value Ref Range Status   Specimen Description URINE, CLEAN CATCH  Final   Special Requests   Final    NONE Performed at [facility], [place] [address], [place], [place] [address]    Culture MULTIPLE SPECIES PRESENT, SUGGEST RECOLLECTION (A)  Final   Report Status 01/16/2019 FINAL  Final  SARS Coronavirus 2 (CEPHEID- Performed in [place] [facility] lab), Hosp Order     Status: Abnormal   Collection Time: 01/16/19  7:12 AM  Result Value Ref Range Status   SARS Coronavirus 2 POSITIVE (A) NEGATIVE Final    Comment: RESULT CALLED TO, READ BACK BY AND VERIFIED WITH: RN N ZOHBI [id number] MLM (NOTE) If result is NEGATIVE SARS-CoV-2 target nucleic acids are NOT DETECTED. The  SARS-CoV-2 RNA is generally detectable in upper and lower  respiratory specimens during the acute phase of infection. The lowest  concentration of SARS-CoV-2 viral copies this assay can detect is 250  copies / mL. A negative result does not preclude SARS-CoV-2 infection  and should not be used as the sole basis for treatment or other  patient management decisions.  A negative result may occur with  improper specimen collection / handling, submission of specimen other  than nasopharyngeal swab, presence of viral mutation(s) within the  areas targeted by this assay, and inadequate number of viral copies  (<250 copies / mL). A negative result must be combined with clinical  observations, patient history, and epidemiological information. If result is POSITIVE SARS-CoV-2 target nucleic acids are DETECTED. The SARS-C oV-2 RNA is generally detectable in upper and lower  respiratory specimens during the acute  phase of infection.  Positive  results are indicative of active infection with SARS-CoV-2.  Clinical  correlation with patient history and other diagnostic information is  necessary to determine patient infection status.  Positive results do  not rule out bacterial infection or co-infection with other viruses. If result is PRESUMPTIVE POSTIVE SARS-CoV-2 nucleic acids MAY BE PRESENT.   A presumptive positive result was obtained on the submitted specimen  and confirmed on repeat testing.  While 2019 novel coronavirus  (SARS-CoV-2) nucleic acids may be present in the submitted sample  additional confirmatory testing may be necessary for epidemiological  and / or clinical management purposes  to differentiate between  SARS-CoV-2 and other Sarbecovirus currently known to infect humans.  If clinically indicated additional testing with an alternate test  methodology [phone]) is advised.  The SARS-CoV-2 RNA is generally  detectable in upper and lower respiratory specimens during the acute    phase of infection. The expected result is Negative. Fact Sheet for Patients:  StrictlyIdeas.no Fact Sheet for Healthcare Providers: BankingDealers.co.za This test is not yet approved or cleared by the [place] FDA and has been authorized for detection and/or diagnosis of SARS-CoV-2 by FDA under an Emergency Use Authorization (EUA).  This EUA will remain in effect (meaning this test can be used) for the duration of the COVID-19 declaration under Section 564(b)(1) of the Act, 21 U.S.C. section 360bbb-3(b)(1), unless the authorization is terminated or revoked sooner. Performed at [facility], [place] [address], [place], [place] [address]   Blood Culture (routine x 2)     Status: None (Preliminary result)   Collection Time: 01/16/19 11:00 AM  Result Value Ref Range Status   Specimen Description BLOOD RIGHT HAND  Final   Special Requests   Final    BOTTLES DRAWN AEROBIC AND ANAEROBIC Blood Culture adequate volume   Culture   Final    NO GROWTH 2 DAYS Performed at [facility], [place] [address] [place], [place] [address]    Report Status PENDING  Incomplete  Blood Culture (routine x 2)     Status: None (Preliminary result)   Collection Time: 01/16/19 11:05 AM  Result Value Ref Range Status   Specimen Description BLOOD RIGHT HAND  Final   Special Requests   Final    BOTTLES DRAWN AEROBIC ONLY Blood Culture results may not be optimal due to an inadequate volume of blood received in culture bottles   Culture   Final    NO GROWTH 2 DAYS Performed at [facility], [place] [address], [place], [place] [address]    Report Status PENDING  Incomplete         Radiology Studies: Mr Jeri Cos And Wo Contrast  Result Date: 01/16/2019 CLINICAL DATA:  Spinal cord abnormality on thoracic MRI, T10 level, possible demyelinating lesion. Assess for disease elsewhere in CNS. Symptoms are low back pain and leg weakness. EXAM: MRI HEAD  WITHOUT AND WITH CONTRAST TECHNIQUE: Multiplanar, multiecho pulse sequences of the brain and surrounding structures were obtained without and with intravenous contrast. CONTRAST:  Gadavist 10 mL. COMPARISON:  MRI thoracic spine earlier today. MRI cervical spine reported separately. FINDINGS: The patient was unable to remain motionless for the exam. Small or subtle lesions could be overlooked. Brain: No evidence for acute infarction, hemorrhage, mass lesion, hydrocephalus, or extra-axial fluid. Normal for age cerebral volume. No white matter disease. Specifically no demyelinating lesions. Post infusion, no abnormal enhancement of the brain or meninges. Vascular: Flow voids  are maintained. Skull and upper cervical spine: Normal marrow signal. 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 Sinuses/Orbits: Negative. Other: None. IMPRESSION: Motion degraded exam demonstrates no acute or focal intracranial abnormality. Specifically, no evidence for active or occult demyelinating lesion in the cerebral, cerebellar or brainstem white matter to suggest multiple sclerosis, CNS infection, or other significant abnormality. Electronically Signed   By: Staci Righter M.D.   On: 01/16/2019 15:00   Mr Thoracic Spine W Contrast  Result Date: 01/16/2019 CLINICAL DATA:  Continued surveillance abnormal noncontrast thoracic MRI. History of back pain and BILATERAL leg weakness. Patient is COVID-19 positive. EXAM: MRI THORACIC SPINE WITH CONTRAST TECHNIQUE: Multiplanar, multisequence MR imaging of the thoracic spine was performed following the administration of intravenous contrast. COMPARISON:  MRI thoracic spine earlier today. FINDINGS: Alignment:  Standard. Vertebrae: No fracture, evidence of discitis, or bone lesion. Cord: Post infusion imaging demonstrates no abnormal enhancement of the thoracic cord at the T10 level, nor elsewhere. Paraspinal and other soft tissues: Patchy BILATERAL lung opacities, greater on the  RIGHT, with a RIGHT pleural effusion. Disc levels: Unchanged from priors.  Small protrusions at T6-7 and T8-9. IMPRESSION: No enhancing lesions are seen in the thoracic cord. The intramedullary lesion at T10 identified previously appears nonacute. BILATERAL pulmonary opacities RIGHT greater than LEFT, with RIGHT pleural effusion, similar to priors. Electronically Signed   By: Staci Righter M.D.   On: 01/16/2019 15:23   Mr Cervical Spine W Or Wo Contrast  Result Date: 01/16/2019 CLINICAL DATA:  Low back pain and leg weakness. Abnormal thoracic MRI. EXAM: MRI CERVICAL SPINE WITHOUT AND WITH CONTRAST TECHNIQUE: Multiplanar and multiecho pulse sequences of the cervical spine, to include the craniocervical junction and cervicothoracic junction, were obtained without and with intravenous contrast. CONTRAST:  Gadavist 10 mL. COMPARISON:  MRI brain reported separately. MRI thoracic spine earlier today. FINDINGS: Alignment: Reversal of normal cervical lordotic curve. No subluxation. Vertebrae: No fracture, evidence of discitis, or bone lesion. Cord: No demyelinating lesions in the cervical cord are seen. No abnormal postcontrast enhancement. Cord flattening is seen at C4-5 and C5-6 related to spondylosis, described below. There is a punctate focus of T2 hyperintensity in the LEFT lateral hemicord, C4-5 level, seen on series 35, image 20, which cannot be confirmed on sagittal imaging and is equivocal for demyelinating disease. The lesion is extremely small, no more than 1 mm diameter. Significance uncertain. Posterior Fossa, vertebral arteries, paraspinal tissues: Negative. Disc levels: C2-3: Annular bulge. Slight effacement anterior subarachnoid space and minimal ventral contact with the cord. No impingement. C3-4:  Annular bulge.  No impingement. C4-5: Shallow protrusion. Minimal cord flattening. There may be mild congenital stenosis. No C5 foraminal narrowing. C5-6: Annular bulge. Disc space narrowing. Effacement  anterior subarachnoid space with minimal cord flattening greater on the LEFT. LEFT greater than RIGHT C6 foraminal narrowing. C6-7: Central protrusion. Effacement anterior subarachnoid space. Uncinate spurring results in RIGHT C7 foraminal narrowing. C7-T1:  Unremarkable. IMPRESSION: Other than a punctate focus of LEFT hemicord T2 hyperintensity seen only on axial imaging at the C4-5 level, no intrinsic abnormality can be seen in the cervical cord. Findings are not conclusive for demyelinating disease. Multilevel spondylosis, with mild stenosis C4 through C7 related to disc pathology, osseous spurring, and reversal of the normal cervical lordotic curve. See discussion above. Electronically Signed   By: Staci Righter M.D.   On: 01/16/2019 15:18        Scheduled Meds:  irbesartan  300 mg Oral Daily   And  amLODipine  5 mg Oral Daily   And   hydrochlorothiazide  25 mg Oral Daily   enoxaparin (LOVENOX) injection  40 mg Subcutaneous Q24H   Melatonin  6 mg Oral QHS   methylPREDNISolone (SOLU-MEDROL) injection  80 mg Intravenous Q12H   senna-docusate  2 tablet Oral BID   sodium chloride flush  10-40 mL Intracatheter Q12H   sodium chloride flush  3 mL Intravenous Q12H   vitamin C  500 mg Oral Daily   zinc sulfate  220 mg Oral Daily   Continuous Infusions:  sodium chloride 250 mL (01/18/19 0909)   famotidine (PEPCID) IV Stopped (01/18/19 0950)     LOS: 2 days   Time spent: 25 minutes   Phillips Climes, MD [facility]  If 7PM-7AM, please contact night-coverage [email or web address] Password [email or web address] 01/18/2019, 12:06 PM

## 2019-01-18 NOTE — Progress Notes (Signed)
Physical Therapy Treatment Patient Details Name: [name] MRN: [id number] DOB: Dec 17, 1970 Today's Date: 01/18/2019    History of Present Illness [name] is a 48 y.o. female with past medical history of hypertension, obesity presents the ED with 2-day history of bilateral lower extremity weakness and numbness. MRI of her L-spine and T-spine was performed in the ER which demonstrated a T2 signal lesion in the T-spine at level of T10.  Incidentally, MRI of her T-spine showed bilateral lung opacities consistent with pneumonia.  COVID-19 test was performed and was positive    PT Comments    Patient instructed in safe use of RW. She displayed progressively less foot clearance "shuffling" as she progressed. On return to sitting, bil ankle DF testing repeated and found to be 5/5. Patient with no imbalance while using RW despite reports of bil foot numbness. Patient reported she hoped she would not need the RW upon discharge home. Educated on benefits of RW and will continue to re-assess her needs at each visit.    Follow Up Recommendations  Outpatient PT;Supervision/Assistance - 24 hour;Other (comment)(would like for near 24 hour assistance to be available to pt)     Equipment Recommendations  Rolling walker with 5" wheels    Recommendations for Other Services       Precautions / Restrictions Precautions Precautions: Back(for comfort) Precaution Comments: Pt indicated she gets up via sidelie to sit often due to back pain Restrictions Weight Bearing Restrictions: No    Mobility  Bed Mobility Overal bed mobility: Modified Independent Bed Mobility: Supine to Sit     Supine to sit: Modified independent (Device/Increase time)(rail, incr time/effort)     General bed mobility comments: Slow moving but not needing physical assist  Transfers Overall transfer level: Needs assistance Equipment used: Rolling walker (2 wheeled) Transfers: Sit to/from Stand Sit to Stand: Min  guard         General transfer comment: from bed at lowest height; vc for proper use of RW  Ambulation/Gait Ambulation/Gait assistance: Min guard Gait Distance (Feet): 120 Feet Assistive device: Rolling walker (2 wheeled) Gait Pattern/deviations: Decreased step length - right;Decreased step length - left;Decreased stride length Gait velocity: Very slow   General Gait Details: vc for proper use of RW for improved safety; vc for posture/relaxing shoulders to conserve energy   Stairs             Wheelchair Mobility    Modified Rankin (Stroke Patients Only)       Balance Overall balance assessment: No apparent balance deficits (not formally assessed)                                          Cognition Arousal/Alertness: Awake/alert Behavior During Therapy: WFL for tasks assessed/performed Overall Cognitive Status: Within Functional Limits for tasks assessed                                        Exercises General Exercises - Lower Extremity Ankle Circles/Pumps: AROM;Both;5 reps    General Comments General comments (skin integrity, edema, etc.): assessed bil DF pre- and post- walk at 5/5, however she reports difficulty lifting feet when walking (shuffles, drags her feet)      Pertinent Vitals/Pain Pain Assessment: Faces Faces Pain Scale: Hurts little more Pain Location: back  Pain Descriptors / Indicators: Discomfort Pain Intervention(s): Limited activity within patient's tolerance;Repositioned    Home Living                      Prior Function            PT Goals (current goals can now be found in the care plan section) Acute Rehab PT Goals Patient Stated Goal: Very much wants to get better Time For Goal Achievement: 01/31/19 Progress towards PT goals: Progressing toward goals    Frequency    Min 3X/week      PT Plan Current plan remains appropriate    Co-evaluation              AM-PAC PT "6  Clicks" Mobility   Outcome Measure  Help needed turning from your back to your side while in a flat bed without using bedrails?: None Help needed moving from lying on your back to sitting on the side of a flat bed without using bedrails?: None Help needed moving to and from a bed to a chair (including a wheelchair)?: A Little Help needed standing up from a chair using your arms (e.g., wheelchair or bedside chair)?: A Little Help needed to walk in hospital room?: A Little Help needed climbing 3-5 steps with a railing? : A Little 6 Click Score: 20    End of Session Equipment Utilized During Treatment: Gait belt Activity Tolerance: Patient tolerated treatment well Patient left: with call bell/phone within reach;in chair   PT Visit Diagnosis: Unsteadiness on feet (R26.81);Other abnormalities of gait and mobility (R26.89)     Time: 1540-0867 PT Time Calculation (min) (ACUTE ONLY): 35 min  Charges:  $Gait Training: 23-37 mins                        [employer], PT 01/18/2019, 12:11 PM

## 2019-01-19 LAB — CBC WITH DIFFERENTIAL/PLATELET
Abs Immature Granulocytes: 0.08 10*3/uL — ABNORMAL HIGH (ref 0.00–0.07)
Basophils Absolute: 0 10*3/uL (ref 0.0–0.1)
Basophils Relative: 0 %
Eosinophils Absolute: 0 10*3/uL (ref 0.0–0.5)
Eosinophils Relative: 0 %
HCT: 39.5 % (ref 36.0–46.0)
Hemoglobin: 12.9 g/dL (ref 12.0–15.0)
Immature Granulocytes: 1 %
Lymphocytes Relative: 14 %
Lymphs Abs: 1.5 10*3/uL (ref 0.7–4.0)
MCH: 29.7 pg (ref 26.0–34.0)
MCHC: 32.7 g/dL (ref 30.0–36.0)
MCV: 90.8 fL (ref 80.0–100.0)
Monocytes Absolute: 0.5 10*3/uL (ref 0.1–1.0)
Monocytes Relative: 5 %
Neutro Abs: 9 10*3/uL — ABNORMAL HIGH (ref 1.7–7.7)
Neutrophils Relative %: 80 %
Platelets: 273 10*3/uL (ref 150–400)
RBC: 4.35 MIL/uL (ref 3.87–5.11)
RDW: 13.2 % (ref 11.5–15.5)
WBC: 11.1 10*3/uL — ABNORMAL HIGH (ref 4.0–10.5)
nRBC: 0 % (ref 0.0–0.2)

## 2019-01-19 LAB — COMPREHENSIVE METABOLIC PANEL
ALT: 35 U/L (ref 0–44)
AST: 24 U/L (ref 15–41)
Albumin: 3.9 g/dL (ref 3.5–5.0)
Alkaline Phosphatase: 76 U/L (ref 38–126)
Anion gap: 9 (ref 5–15)
BUN: 31 mg/dL — ABNORMAL HIGH (ref 6–20)
CO2: 25 mmol/L (ref 22–32)
Calcium: 9.2 mg/dL (ref 8.9–10.3)
Chloride: 100 mmol/L (ref 98–111)
Creatinine, Ser: 0.91 mg/dL (ref 0.44–1.00)
GFR calc Af Amer: >60 mL/min (ref >60)
GFR calc non Af Amer: >60 mL/min (ref >60)
Glucose, Bld: 219 mg/dL — ABNORMAL HIGH (ref 70–99)
Potassium: 3.9 mmol/L (ref 3.5–5.1)
Sodium: 134 mmol/L — ABNORMAL LOW (ref 135–145)
Total Bilirubin: 0.6 mg/dL (ref 0.3–1.2)
Total Protein: 7.5 g/dL (ref 6.5–8.1)

## 2019-01-19 LAB — D-DIMER, QUANTITATIVE: D-Dimer, Quant: 0.51 ug/mL-FEU — ABNORMAL HIGH (ref 0.00–0.50)

## 2019-01-19 LAB — MISC LABCORP TEST (SEND OUT): Labcorp test code: 4210

## 2019-01-19 LAB — LACTATE DEHYDROGENASE: LDH: 190 U/L (ref 98–192)

## 2019-01-19 LAB — GLUCOSE, CAPILLARY
Glucose-Capillary: 336 mg/dL — ABNORMAL HIGH (ref 70–99)
Glucose-Capillary: 263 mg/dL — ABNORMAL HIGH (ref 70–99)

## 2019-01-19 LAB — C-REACTIVE PROTEIN: CRP: <0.8 mg/dL (ref <1.0)

## 2019-01-19 MED ORDER — MAGNESIUM CITRATE PO SOLN
1.0000 | Freq: Once | ORAL | Status: AC
  Administered 2019-01-19: 1 via ORAL
  Filled 2019-01-19: qty 296

## 2019-01-19 MED ORDER — INSULIN ASPART 100 UNIT/ML ~~LOC~~ SOLN
0.0000 [IU] | Freq: Three times a day (TID) | SUBCUTANEOUS | Status: DC
  Administered 2019-01-19: 11 [IU] via SUBCUTANEOUS
  Administered 2019-01-20: 13:00:00 8 [IU] via SUBCUTANEOUS
  Administered 2019-01-20: 3 [IU] via SUBCUTANEOUS

## 2019-01-19 MED ORDER — HYDROCODONE-ACETAMINOPHEN 5-325 MG PO TABS
1.0000 | ORAL_TABLET | Freq: Four times a day (QID) | ORAL | Status: DC | PRN
  Administered 2019-01-19 – 2019-01-20 (×4): 1 via ORAL
  Filled 2019-01-19 (×4): qty 1

## 2019-01-19 NOTE — Progress Notes (Signed)
Physical Therapy Treatment Patient Details Name: [name] MRN: [id number] DOB: 09/10/1970 Today's Date: 01/19/2019    History of Present Illness [name] is a 48 y.o. female with past medical history of hypertension, obesity presents the ED with 2-day history of bilateral lower extremity weakness and numbness. MRI of her L-spine and T-spine was performed in the ER which demonstrated a T2 signal lesion in the T-spine at level of T10.  Incidentally, MRI of her T-spine showed bilateral lung opacities consistent with pneumonia.  COVID-19 test was performed and was positive    PT Comments    Initiated session with chair exercises for LEs to try to "warm up" prior to activity. Patient's back pain increasing to 8/10 despite Tylenol 30 minutes prior. Educated patient in ways to position to minimize pressure on her lumbar spine. Patient eager to walk but feels she cannot at this time. Assured her PT will return when her pain level lessens. Plan to assess ambulation without RW if appropriate.      Follow Up Recommendations  Outpatient PT;Supervision/Assistance - 24 hour;Other (comment)(would like for near 24 hour assistance to be available to pt)     Equipment Recommendations  Rolling walker with 5" wheels    Recommendations for Other Services       Precautions / Restrictions Precautions Precautions: Back(for comfort) Precaution Comments: Pt indicated she gets up via sidelie to sit often due to back pain Restrictions Weight Bearing Restrictions: No    Mobility  Bed Mobility                  Transfers                 General transfer comment: deferred due to 8/10 pain and pt requested wait until pain med begins to work  Ambulation/Gait                 Stairs             Wheelchair Mobility    Modified Rankin (Stroke Patients Only)       Balance                                            Cognition  Arousal/Alertness: Awake/alert Behavior During Therapy: WFL for tasks assessed/performed Overall Cognitive Status: Within Functional Limits for tasks assessed                                        Exercises General Exercises - Lower Extremity Long Arc Quad: Strengthening;Both;5 reps;Seated Toe Raises: Seated;Both;10 reps Heel Raises: Both;10 reps;Seated    General Comments General comments (skin integrity, edema, etc.): Educated on positioning to reduce back pain (pt prefers to stay in recliner--pillow to her lumbar area, recline with pillow under knees) try to change position at least every 1-2 hours; benefits of walking      Pertinent Vitals/Pain Pain Assessment: 0-10 Pain Score: 8  Pain Location: back Pain Descriptors / Indicators: Discomfort;Grimacing Pain Intervention(s): Limited activity within patient's tolerance;Monitored during session;Repositioned;Other (comment);Premedicated before session(had tylenol 30 min prior and felt it was not yet working)    Home Living                      Prior Function  PT Goals (current goals can now be found in the care plan section) Acute Rehab PT Goals Patient Stated Goal: Very much wants to get better Time For Goal Achievement: 01/31/19 Progress towards PT goals: Not progressing toward goals - comment(limited by pain)    Frequency    Min 3X/week      PT Plan Current plan remains appropriate    Co-evaluation              AM-PAC PT "6 Clicks" Mobility   Outcome Measure  Help needed turning from your back to your side while in a flat bed without using bedrails?: None Help needed moving from lying on your back to sitting on the side of a flat bed without using bedrails?: None Help needed moving to and from a bed to a chair (including a wheelchair)?: A Little Help needed standing up from a chair using your arms (e.g., wheelchair or bedside chair)?: A Little Help needed to walk in  hospital room?: A Little Help needed climbing 3-5 steps with a railing? : A Little 6 Click Score: 20    End of Session   Activity Tolerance: Patient limited by pain Patient left: with call bell/phone within reach;in chair Nurse Communication: Other (comment)(will return when pain decreases) PT Visit Diagnosis: Unsteadiness on feet (R26.81);Other abnormalities of gait and mobility (R26.89)     Time: 3833-3832 PT Time Calculation (min) (ACUTE ONLY): 24 min  Charges:  $Therapeutic Exercise: 8-22 mins $Self Care/Home Management: 8-22                        Rexanne Mano, PT 01/19/2019, 11:47 AM

## 2019-01-19 NOTE — Progress Notes (Signed)
PROGRESS NOTE    [name]  CBJ:[id number] DOB: [birth year] DOA: 01/15/2019 PCP: Burnis Medin, MD    Brief Narrative:  48 y.o. female with medical history significant of hypertension, keloids, and remote history of teratoma s/p removal, presents with complaints of lower extremity numbness and weakness, she was seen by neurology,  MRI brain with contrast which showed no demyelinating lesions.  We also obtain MRI C-spine which showed punctate left hemi-cord signal abnormality at C4-C5.  Reviewed her MRI T-spine obtained with contrast (prior T spine was without contrast), T10 hemicord lesion does not enhance.  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  Subjective: Patient febrile over last 24 hours, [place] any worsening of her weakness, report no significant change, no headache, no nausea, no vision change .  Assessment & Plan:   Principal Problem:   Other incomplete lesion at T10 level of thoracic spinal cord, initial encounter [facility]) Active Problems:   Lower extremity weakness   Pneumonia due to COVID-19 virus  Right leg weakness 2/2 T10 hemicord lesion, nonenhancing and possible C4-C5 lesion. -Neurology input greatly appreciated, differential diagnoses include postinfectious transverse myelitis(there are few case reports of COVID-19 and first myelitis per my discussion with neurology) versus systemic autoimmune condition such MS, (rare conditions such as drop mets, intramedullary tumor, spinal vascular lesions remote possibility) . -F/U ANA and ANCA, dsDNA, RF, ANA panel, antiphospholipid antibodies, CRP ordered - F/U Anti-aqauporin 4 antibody ordered -Discussed with the neurology, currently no indication for LP, specially her T10 lesion has not been enhancing with contrast , but LP can be considered as an  outpatient if her symptoms worsen . -Continue with PT/OT -Recommendation to continue with IV steroids for total of 3 days, to finish tomorrow ,then outpatient follow-up with neurology.   Pneumonia due to COVID-19 -Chest x-ray significant for bilateral infiltrate concerning for pneumonia -No oxygen requirement currently, no indication for Remdesivire, or Actemra, she is on IV steroids for her neurological findingS. -Continue with vitamin C and zinc  DVT prophylaxis:lovenox Code Status:full Family Communication:  Disussed with patient Disposition Plan: Possible discharge tomorrow Consults called: Neurology     Estimated body mass index is 34.56 kg/m as calculated from the following:   Height as of this encounter: 6' (1.829 m).   Weight as of this encounter: 115.6 kg.     Objective: Vitals:   01/19/19 0953 01/19/19 1100 01/19/19 1211 01/19/19 1309  BP:      Pulse: 72 70 71 83  Resp: 18 12 16 19   Temp:      TempSrc:      SpO2: 94% 99% 95% 93%  Weight:      Height:        Intake/Output Summary (Last 24 hours) at 01/19/2019 1327 Last data filed at 01/19/2019 1142 Gross per 24 hour  Intake 675.77 ml  Output -  Net 675.77 ml   Filed Weights   01/17/19 1729  Weight: 115.6 kg    Examination:  Awake Alert, Oriented X 3, No new F.N deficits, Normal affect Symmetrical Chest wall movement, Good air movement bilaterally, CTAB RRR,No Gallops,Rubs or new Murmurs, No Parasternal Heave +ve B.Sounds, Abd Soft, No tenderness, No rebound - guarding or rigidity. No Cyanosis, Clubbing or edema, No new Rash or bruise    .     Data Reviewed:  I have personally reviewed following labs and imaging studies  CBC: Recent Labs  Lab 01/16/19 0712 01/17/19 0958 01/19/19 0515  WBC 5.3 4.4 11.1*  NEUTROABS 2.4 2.2 9.0*  HGB 13.6 12.9 12.9  HCT 40.3 38.8 39.5  MCV 90.2 89.8 90.8  PLT 169 176 431   Basic Metabolic Panel: Recent Labs  Lab 01/16/19 0712 01/17/19 0958 01/19/19  0515  NA 137 136 134*  K 3.8 3.3* 3.9  CL 101 97* 100  CO2 24 28 25   GLUCOSE 108* 149* 219*  BUN 14 11 31*  CREATININE 0.85 1.06* 0.91  CALCIUM 8.8* 8.8* 9.2  MG  --  2.0  --   PHOS  --  3.0  --    GFR: Estimated Creatinine Clearance: 107.5 mL/min (by C-G formula based on SCr of 0.91 mg/dL). Liver Function Tests: Recent Labs  Lab 01/16/19 0712 01/17/19 0958 01/19/19 0515  AST 38 47* 24  ALT 39 44 35  ALKPHOS 68 71 76  BILITOT 1.1 1.0 0.6  PROT 7.0 7.0 7.5  ALBUMIN 3.7 3.5 3.9   No results for input(s): LIPASE, AMYLASE in the last 168 hours. No results for input(s): AMMONIA in the last 168 hours. Coagulation Profile: No results for input(s): INR, PROTIME in the last 168 hours. Cardiac Enzymes: Recent Labs  Lab 01/17/19 0958  CKTOTAL 141   BNP (last 3 results) No results for input(s): PROBNP in the last 8760 hours. HbA1C: No results for input(s): HGBA1C in the last 72 hours. CBG: No results for input(s): GLUCAP in the last 168 hours. Lipid Profile: Recent Labs    01/17/19 0958  TRIG 80   Thyroid Function Tests: No results for input(s): TSH, T4TOTAL, FREET4, T3FREE, THYROIDAB in the last 72 hours. Anemia Panel: Recent Labs    01/17/19 0958  FERRITIN 486*   Sepsis Labs: Recent Labs  Lab 01/16/19 0951 01/16/19 1125  PROCALCITON <0.10  --   LATICACIDVEN  --  1.1    Recent Results (from the past 240 hour(s))  Urine culture     Status: Abnormal   Collection Time: 01/16/19  2:54 AM  Result Value Ref Range Status   Specimen Description URINE, CLEAN CATCH  Final   Special Requests   Final    NONE Performed at [facility], [place] [address], [place], [place] [address]    Culture MULTIPLE SPECIES PRESENT, SUGGEST RECOLLECTION (A)  Final   Report Status 01/16/2019 FINAL  Final  SARS Coronavirus 2 (CEPHEID- Performed in [facility] hospital lab), Hosp Order     Status: Abnormal   Collection Time: 01/16/19  7:12 AM  Result Value Ref Range Status    SARS Coronavirus 2 POSITIVE (A) NEGATIVE Final    Comment: RESULT CALLED TO, READ BACK BY AND VERIFIED WITH: RN N ZOHBI [id number] MLM (NOTE) If result is NEGATIVE SARS-CoV-2 target nucleic acids are NOT DETECTED. The SARS-CoV-2 RNA is generally detectable in upper and lower  respiratory specimens during the acute phase of infection. The lowest  concentration of SARS-CoV-2 viral copies this assay can detect is 250  copies / mL. A negative result does not preclude SARS-CoV-2 infection  and should not be used as the sole basis for treatment or other  patient management decisions.  A negative result may occur with  improper specimen collection / handling, submission of specimen other  than nasopharyngeal swab, presence of viral mutation(s) within the  areas targeted by this assay, and inadequate number of viral copies  (<  250 copies / mL). A negative result must be combined with clinical  observations, patient history, and epidemiological information. If result is POSITIVE SARS-CoV-2 target nucleic acids are DETECTED. The SARS-C oV-2 RNA is generally detectable in upper and lower  respiratory specimens during the acute phase of infection.  Positive  results are indicative of active infection with SARS-CoV-2.  Clinical  correlation with patient history and other diagnostic information is  necessary to determine patient infection status.  Positive results do  not rule out bacterial infection or co-infection with other viruses. If result is PRESUMPTIVE POSTIVE SARS-CoV-2 nucleic acids MAY BE PRESENT.   A presumptive positive result was obtained on the submitted specimen  and confirmed on repeat testing.  While 2019 novel coronavirus  (SARS-CoV-2) nucleic acids may be present in the submitted sample  additional confirmatory testing may be necessary for epidemiological  and / or clinical management purposes  to differentiate between  SARS-CoV-2 and other Sarbecovirus currently known to  infect humans.  If clinically indicated additional testing with an alternate test  methodology [id number]) is advised.  The SARS-CoV-2 RNA is generally  detectable in upper and lower respiratory specimens during the acute  phase of infection. The expected result is Negative. Fact Sheet for Patients:  StrictlyIdeas.no Fact Sheet for Healthcare Providers: BankingDealers.co.za This test is not yet approved or cleared by the [place] FDA and has been authorized for detection and/or diagnosis of SARS-CoV-2 by FDA under an Emergency Use Authorization (EUA).  This EUA will remain in effect (meaning this test can be used) for the duration of the COVID-19 declaration under Section 564(b)(1) of the Act, 21 U.S.C. section 360bbb-3(b)(1), unless the authorization is terminated or revoked sooner. Performed at [facility], [place] [address], [place], [place] [address]   Blood Culture (routine x 2)     Status: None (Preliminary result)   Collection Time: 01/16/19 11:00 AM  Result Value Ref Range Status   Specimen Description BLOOD RIGHT HAND  Final   Special Requests   Final    BOTTLES DRAWN AEROBIC AND ANAEROBIC Blood Culture adequate volume   Culture   Final    NO GROWTH 3 DAYS Performed at [facility], [place] [address], [place], [place] [address]    Report Status PENDING  Incomplete  Blood Culture (routine x 2)     Status: None (Preliminary result)   Collection Time: 01/16/19 11:05 AM  Result Value Ref Range Status   Specimen Description BLOOD RIGHT HAND  Final   Special Requests   Final    BOTTLES DRAWN AEROBIC ONLY Blood Culture results may not be optimal due to an inadequate volume of blood received in culture bottles   Culture   Final    NO GROWTH 3 DAYS Performed at [facility], [place] [address], [place], [place] [address]    Report Status PENDING  Incomplete         Radiology Studies: No results found.       Scheduled Meds: . irbesartan  300 mg Oral Daily   And  . amLODipine  5 mg Oral Daily   And  . hydrochlorothiazide  25 mg Oral Daily  . enoxaparin (LOVENOX) injection  40 mg Subcutaneous Q24H  . Melatonin  6 mg Oral QHS  . methylPREDNISolone (SOLU-MEDROL) injection  80 mg Intravenous Q12H  . senna-docusate  2 tablet Oral BID  . sodium chloride flush  10-40 mL Intracatheter Q12H  . sodium chloride flush  3  mL Intravenous Q12H  . vitamin C  500 mg Oral Daily  . zinc sulfate  220 mg Oral Daily   Continuous Infusions: . sodium chloride Stopped (01/19/19 1142)  . famotidine (PEPCID) IV Stopped (01/19/19 0935)     LOS: 3 days   Time spent: 25 minutes   Phillips Climes, MD [facility]  If 7PM-7AM, please contact night-coverage [email or web address] Password [email or web address] 01/19/2019, 1:27 PM

## 2019-01-19 NOTE — Progress Notes (Signed)
The pt reported that she is updating her Husband and declined for I to do so.

## 2019-01-19 NOTE — Progress Notes (Signed)
Inpatient Diabetes Program Recommendations  AACE/ADA: New Consensus Statement on Inpatient Glycemic Control (2015)  Target Ranges:  Prepandial:   less than 140 mg/dL      Peak postprandial:   less than 180 mg/dL (1-2 hours)      Critically ill patients:  140 - 180 mg/dL   Lab Results  Component Value Date   HGBA1C 6.5 12/20/2017    Review of Glycemic Control Results for [name], [name] (MRN [id number]) as of 01/19/2019 10:24  Ref. Range 01/17/2019 09:58 01/19/2019 05:15  Glucose Latest Ref Range: 70 - 99 mg/dL 149 (H) 219 (H)   Diabetes history: None Outpatient Diabetes medications:  None Current orders for Inpatient glycemic control:  Solumedrol 80 mg IV q 12 hours Inpatient Diabetes Program Recommendations:   May consider adding Novolog correction moderate tid with meals and HS while on steroids.    Thanks  Adah Perl, RN, BC-ADM Inpatient Diabetes Coordinator Pager [phone] (8a-5p)

## 2019-01-19 NOTE — Progress Notes (Signed)
Physical Therapy Treatment Patient Details Name: [name] MRN: [id number] DOB: 1970-09-17 Today's Date: 01/19/2019    History of Present Illness [name] is a 48 y.o. female with past medical history of hypertension, obesity presents the ED with 2-day history of bilateral lower extremity weakness and numbness. MRI of her L-spine and T-spine was performed in the ER which demonstrated a T2 signal lesion in the T-spine at level of T10.  Incidentally, MRI of her T-spine showed bilateral lung opacities consistent with pneumonia.  COVID-19 test was performed and was positive    PT Comments    Patient adamant re: not wanting to use a RW today or to obtain one for use at home. Educated re: incr risk of falling and potential injuries due to her leg weakness and numbness in feet. She did ambulate 200 ft very slowly without device (or even hold onto rail in hall). Educated on potential sidelying or supine positioning for minimizing back pain, however pt reports she cannot stand the hot mattress and slept in the recliner last night. Provided handout for basic LE exercises (including those she performed this morning) and verbally educated on additional exercises in packet. Anticipate she will discharge home 01/20/19 (per pt report).     Follow Up Recommendations  Outpatient PT (? Restrictions or delay due to COVID +) ;Supervision/Assistance - 24 hour;Other (comment)(would like for near 24 hour assistance to be available to pt)     Equipment Recommendations  Other (comment)(pt refusing RW despite PT recommendation)    Recommendations for Other Services       Precautions / Restrictions Precautions Precautions: Back;Fall(for comfort) Precaution Comments: Fall due to bil feet numb and bil leg weakness    Mobility  Bed Mobility                  Transfers Overall transfer level: Needs assistance Equipment used: None Transfers: Sit to/from Stand Sit to Stand: Min guard          General transfer comment: closeguard due to pt refused RW  Ambulation/Gait Ambulation/Gait assistance: Min guard Gait Distance (Feet): 200 Feet Assistive device: None(pt refused) Gait Pattern/deviations: Decreased step length - right;Decreased step length - left;Decreased stride length     General Gait Details: velocity and step length less without RW; wide BOS; steady   Marine scientist Rankin (Stroke Patients Only)       Balance                                            Cognition Arousal/Alertness: Awake/alert Behavior During Therapy: WFL for tasks assessed/performed Overall Cognitive Status: Within Functional Limits for tasks assessed                                        Exercises Other Exercises Other Exercises: provided with basic LE exercise handout; verbally reviewed and emphasized how to bed one leg to protect her back during supine exercises (pt did not want to lie down to practice)    General Comments General comments (skin integrity, edema, etc.): SaO2 95% on room air at rest; with walking incr to 100%; HR 92-98      Pertinent Vitals/Pain Pain Assessment:  0-10 Pain Score: 3  Pain Location: back Pain Descriptors / Indicators: Discomfort;Grimacing Pain Intervention(s): Limited activity within patient's tolerance;Premedicated before session;Monitored during session;Repositioned    Home Living                      Prior Function            PT Goals (current goals can now be found in the care plan section) Acute Rehab PT Goals Patient Stated Goal: Very much wants to get better Time For Goal Achievement: 01/31/19 Progress towards PT goals: Progressing toward goals    Frequency    Min 3X/week      PT Plan Current plan remains appropriate    Co-evaluation              AM-PAC PT "6 Clicks" Mobility   Outcome Measure  Help needed turning from your  back to your side while in a flat bed without using bedrails?: None Help needed moving from lying on your back to sitting on the side of a flat bed without using bedrails?: None Help needed moving to and from a bed to a chair (including a wheelchair)?: None Help needed standing up from a chair using your arms (e.g., wheelchair or bedside chair)?: A Little Help needed to walk in hospital room?: A Little Help needed climbing 3-5 steps with a railing? : A Little 6 Click Score: 21    End of Session   Activity Tolerance: Patient limited by fatigue Patient left: with call bell/phone within reach;in chair   PT Visit Diagnosis: Unsteadiness on feet (R26.81);Other abnormalities of gait and mobility (R26.89)     Time: 4462-8638 PT Time Calculation (min) (ACUTE ONLY): 45 min  Charges:  $Gait Training: 23-37 mins $Therapeutic Exercise: 8-22 mins                        [employer], PT 01/19/2019, 4:19 PM

## 2019-01-19 NOTE — TOC Initial Note (Signed)
Transition of Care [facility]) - Initial/Assessment Note    Patient Details  Name: [name] MRN: [id number] Date of Birth: [birth year]  Transition of Care [facility]) CM/SW Contact:    Midge Minium RN, BSN, NCM-BC, ACM-RN [phone] (working remotely) Phone Number: 01/19/2019, 4:49 PM  Clinical Narrative:                 CM consult acknowledged for outpatient PT arrangement. CM attempted to contact the patient with no answer. CM team will continue to follow for arrangement of services.   Expected Discharge Plan: OP Rehab Barriers to Discharge: Continued Medical Work up   Patient Goals and CMS Choice     Choice offered to / list presented to : NA  Expected Discharge Plan and Services Expected Discharge Plan: OP Rehab   Discharge Planning Services: CM Consult Post Acute Care Choice: (Outpatient PT) Living arrangements for the past 2 months: Single Family Home                  Prior Living Arrangements/Services Living arrangements for the past 2 months: Single Family Home Lives with:: Self, Spouse    Activities of Daily Living Home Assistive Devices/Equipment: None ADL Screening (condition at time of admission) Patient's cognitive ability adequate to safely complete daily activities?: Yes Is the patient deaf or have difficulty hearing?: No Does the patient have difficulty seeing, even when wearing glasses/contacts?: No Does the patient have difficulty concentrating, remembering, or making decisions?: No Patient able to express need for assistance with ADLs?: Yes Does the patient have difficulty dressing or bathing?: No Independently performs ADLs?: Yes (appropriate for developmental age) Does the patient have difficulty walking or climbing stairs?: No Weakness of Legs: None Weakness of Arms/Hands: None  Permission Sought/Granted                  Emotional Assessment              Admission diagnosis:  Paresthesia [R20.2] Acute bilateral low back pain,  unspecified whether sciatica present [M54.5] COVID-19 virus detected [U07.1] Patient Active Problem List   Diagnosis Date Noted  . Lower extremity weakness 01/16/2019  . Other incomplete lesion at T10 level of thoracic spinal cord, initial encounter ([employer]) 01/16/2019  . Pneumonia due to COVID-19 virus 01/16/2019  . Notalgia 08/10/2017  . Morbid obesity ([place]) 02/18/2017  . OSA (obstructive sleep apnea) 01/24/2017  . 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 Snoring 10/22/2016  . Nocturnal dyspnea/choking   10/22/2016  . Keloid of skin 09/25/2015  . Essential hypertension, benign 09/27/2014  . Teratoma 08/07/2014  . Severe hypertension 06/08/2013  . HYPERLIPIDEMIA, MILD, WITH LOW HDL 07/23/2009  . SHORTNESS OF BREATH 02/11/2009  . HYPERTENSION 05/07/2008  . KELOID SCAR 05/07/2008  . HEADACHE 05/22/2007   PCP:  Burnis Medin, MD Pharmacy:   [facility] [place], [place] - [address] [place] AT [address] [place] [address] Phone: [phone] Fax: [phone]     Social Determinants of Health (SDOH) Interventions    Readmission Risk Interventions No flowsheet data found.

## 2019-01-19 NOTE — Progress Notes (Signed)
Elgergawy, Dawood, MD was paged regarding the pt's back pain and request for pain meds.

## 2019-01-20 LAB — CBC WITH DIFFERENTIAL/PLATELET
Abs Immature Granulocytes: 0.12 10*3/uL — ABNORMAL HIGH (ref 0.00–0.07)
Basophils Absolute: 0 10*3/uL (ref 0.0–0.1)
Basophils Relative: 0 %
Eosinophils Absolute: 0 10*3/uL (ref 0.0–0.5)
Eosinophils Relative: 0 %
HCT: 40.3 % (ref 36.0–46.0)
Hemoglobin: 13.3 g/dL (ref 12.0–15.0)
Immature Granulocytes: 1 %
Lymphocytes Relative: 15 %
Lymphs Abs: 2.2 10*3/uL (ref 0.7–4.0)
MCH: 29.7 pg (ref 26.0–34.0)
MCHC: 33 g/dL (ref 30.0–36.0)
MCV: 90 fL (ref 80.0–100.0)
Monocytes Absolute: 0.9 10*3/uL (ref 0.1–1.0)
Monocytes Relative: 7 %
Neutro Abs: 11 10*3/uL — ABNORMAL HIGH (ref 1.7–7.7)
Neutrophils Relative %: 77 %
Platelets: 307 10*3/uL (ref 150–400)
RBC: 4.48 MIL/uL (ref 3.87–5.11)
RDW: 13.1 % (ref 11.5–15.5)
WBC: 14.2 10*3/uL — ABNORMAL HIGH (ref 4.0–10.5)
nRBC: 0 % (ref 0.0–0.2)

## 2019-01-20 LAB — ANTIPHOSPHOLIPID SYNDROME EVAL, BLD
Anticardiolipin IgA: <9 APL U/mL (ref 0–11)
Anticardiolipin IgG: <9 GPL U/mL (ref 0–14)
Anticardiolipin IgM: <9 MPL U/mL (ref 0–12)
DRVVT: 36.3 s (ref 0.0–47.0)
PTT Lupus Anticoagulant: 30 s (ref 0.0–51.9)
Phosphatydalserine, IgA: 3 APS IgA (ref 0–20)
Phosphatydalserine, IgG: 7 GPS IgG (ref 0–11)
Phosphatydalserine, IgM: 10 MPS IgM (ref 0–25)

## 2019-01-20 LAB — COMPREHENSIVE METABOLIC PANEL
ALT: 34 U/L (ref 0–44)
AST: 22 U/L (ref 15–41)
Albumin: 3.8 g/dL (ref 3.5–5.0)
Alkaline Phosphatase: 83 U/L (ref 38–126)
Anion gap: 11 (ref 5–15)
BUN: 30 mg/dL — ABNORMAL HIGH (ref 6–20)
CO2: 25 mmol/L (ref 22–32)
Calcium: 9.4 mg/dL (ref 8.9–10.3)
Chloride: 96 mmol/L — ABNORMAL LOW (ref 98–111)
Creatinine, Ser: 0.82 mg/dL (ref 0.44–1.00)
GFR calc Af Amer: >60 mL/min (ref >60)
GFR calc non Af Amer: >60 mL/min (ref >60)
Glucose, Bld: 209 mg/dL — ABNORMAL HIGH (ref 70–99)
Potassium: 4.2 mmol/L (ref 3.5–5.1)
Sodium: 132 mmol/L — ABNORMAL LOW (ref 135–145)
Total Bilirubin: 0.3 mg/dL (ref 0.3–1.2)
Total Protein: 7.5 g/dL (ref 6.5–8.1)

## 2019-01-20 LAB — GLUCOSE, CAPILLARY
Glucose-Capillary: 176 mg/dL — ABNORMAL HIGH (ref 70–99)
Glucose-Capillary: 260 mg/dL — ABNORMAL HIGH (ref 70–99)

## 2019-01-20 LAB — D-DIMER, QUANTITATIVE: D-Dimer, Quant: 0.4 ug/mL-FEU (ref 0.00–0.50)

## 2019-01-20 LAB — LACTATE DEHYDROGENASE: LDH: 183 U/L (ref 98–192)

## 2019-01-20 LAB — C-REACTIVE PROTEIN: CRP: 0.8 mg/dL (ref <1.0)

## 2019-01-20 MED ORDER — FAMOTIDINE 20 MG PO TABS
20.0000 mg | ORAL_TABLET | Freq: Two times a day (BID) | ORAL | Status: DC
  Administered 2019-01-20: 20 mg via ORAL
  Filled 2019-01-20: qty 1

## 2019-01-20 MED ORDER — FLEET ENEMA 7-19 GM/118ML RE ENEM
1.0000 | ENEMA | Freq: Once | RECTAL | Status: AC
  Administered 2019-01-20: 1 via RECTAL
  Filled 2019-01-20: qty 1

## 2019-01-20 MED ORDER — ASCORBIC ACID 500 MG PO TABS: 500.0000 mg | ORAL_TABLET | Freq: Every day | ORAL | Status: AC

## 2019-01-20 MED ORDER — HYDROCODONE-ACETAMINOPHEN 5-325 MG PO TABS: 1.0000 | ORAL_TABLET | Freq: Four times a day (QID) | ORAL | 0 refills | Status: DC | PRN

## 2019-01-20 MED ORDER — ZINC SULFATE 220 (50 ZN) MG PO CAPS: 220.0000 mg | ORAL_CAPSULE | Freq: Every day | ORAL | Status: AC

## 2019-01-20 NOTE — Progress Notes (Signed)
PHARMACIST - PHYSICIAN COMMUNICATION  DR:   Elgergawy  CONCERNING: IV to Oral Route Change Policy  RECOMMENDATION: This patient is receiving famotidine by the intravenous route.  Based on criteria approved by the Pharmacy and Therapeutics Committee, the intravenous medication(s) is/are being converted to the equivalent oral dose form(s).   DESCRIPTION: These criteria include:  The patient is eating (either orally or via tube) and/or has been taking other orally administered medications for a least 24 hours  The patient has no evidence of active gastrointestinal bleeding or impaired GI absorption (gastrectomy, short bowel, patient on TNA or NPO).  If you have questions about this conversion, please contact the Pharmacy Department  []   [phone] )  [facility] []   [phone] )  [facility] []   [phone] )  [facility] []   [phone] )  [facility] []   [phone] )  [place] [phone]

## 2019-01-20 NOTE — Progress Notes (Signed)
Physical Therapy Treatment Patient Details Name: [name] MRN: [id number] DOB: 1970-10-02 Today's Date: 01/20/2019    History of Present Illness [name] is a 48 y.o. female with past medical history of hypertension, obesity presents the ED with 2-day history of bilateral lower extremity weakness and numbness. MRI of her L-spine and T-spine was performed in the ER which demonstrated a T2 signal lesion in the T-spine at level of T10.  Incidentally, MRI of her T-spine showed bilateral lung opacities consistent with pneumonia.  COVID-19 test was performed and was positive    PT Comments    Pt reports this is her second walk all the way around the unit today.  She did have progressive R LE weakness and decreased foot clearance the further we went.  Gait speed is very slow and pt continues to refuse RW or cane for increased stability.  We talked about ice or heat and she did not want to try those either.  She reports she is due to d/c home later today.   PT to follow acutely for deficits listed below.     Follow Up Recommendations  Outpatient PT;Supervision for mobility/OOB     Equipment Recommendations  Other (comment)(refusing RW or cane)    Recommendations for Other Services   NA     Precautions / Restrictions Precautions Precautions: Back;Fall Precaution Comments: Fall due to bil feet numb and bil leg weakness    Mobility  Bed Mobility               General bed mobility comments: Pt was OOB in the recliner chair, pt reports more comfortable to sleep in recliner chair vs hospital bed.   Transfers Overall transfer level: Needs assistance Equipment used: None Transfers: Sit to/from Stand Sit to Stand: Supervision         General transfer comment: supervision for safety, slow transition.   Ambulation/Gait Ambulation/Gait assistance: Supervision Gait Distance (Feet): 500 Feet Assistive device: None Gait Pattern/deviations: Step-through pattern;Decreased  dorsiflexion - right;Shuffle Gait velocity: Very slow Gait velocity interpretation: 1.31 - 2.62 ft/sec, indicative of limited community ambulator General Gait Details: Pt with slow, wide base, shuffling gait pattern, with increased R LE weakness (difficulty clearing R foot) by the end of gait.    Stairs Stairs: Yes       General stair comments: We verbally reviewed up with the good down with the bad pnumonic to remember LE sequencing with stairs.           Balance Overall balance assessment: Needs assistance Sitting-balance support: Feet supported;No upper extremity supported Sitting balance-Leahy Scale: Good     Standing balance support: No upper extremity supported Standing balance-Leahy Scale: Fair                              Cognition Arousal/Alertness: Awake/alert Behavior During Therapy: WFL for tasks assessed/performed Overall Cognitive Status: Within Functional Limits for tasks assessed                                        Exercises Other Exercises Other Exercises: Pt reports compliance with given exercises, declined wanting to try ice or heat reporting the pillow in the small of her back helped and that she is due pain medication soon.      General Comments General comments (skin integrity, edema, etc.): O2 stable on  RA during gait.        Pertinent Vitals/Pain Pain Assessment: Faces Faces Pain Scale: Hurts even more Pain Location: back down R LE>L LE Pain Descriptors / Indicators: Discomfort;Grimacing Pain Intervention(s): Limited activity within patient's tolerance;Monitored during session;Repositioned           PT Goals (current goals can now be found in the care plan section) Acute Rehab PT Goals Patient Stated Goal: Very much wants to get better Progress towards PT goals: Progressing toward goals    Frequency    Min 3X/week      PT Plan Current plan remains appropriate       AM-PAC PT "6 Clicks"  Mobility   Outcome Measure  Help needed turning from your back to your side while in a flat bed without using bedrails?: None Help needed moving from lying on your back to sitting on the side of a flat bed without using bedrails?: None Help needed moving to and from a bed to a chair (including a wheelchair)?: None Help needed standing up from a chair using your arms (e.g., wheelchair or bedside chair)?: None Help needed to walk in hospital room?: None Help needed climbing 3-5 steps with a railing? : A Little 6 Click Score: 23    End of Session Equipment Utilized During Treatment: Gait belt Activity Tolerance: Patient limited by fatigue;Patient limited by pain Patient left: with call bell/phone within reach;in chair   PT Visit Diagnosis: Unsteadiness on feet (R26.81);Other abnormalities of gait and mobility (R26.89)     Time: 5465-0354 PT Time Calculation (min) (ACUTE ONLY): 15 min  Charges:  $Gait Training: 8-22 mins                    Mikhai Bienvenue B. Khylei Wilms, PT, DPT  Acute Rehabilitation [phone] pager #[phone] office   01/20/2019, 4:35 PM

## 2019-01-20 NOTE — Care Management (Signed)
Case manager will follow up on next business day- Monday, January 22, 2019 to arrange for outpatient therapy, have to confirm if oupt facilities are allowing COVID patients.Ricki Miller, RN BSN Case Manager [phone]

## 2019-01-20 NOTE — Discharge Summary (Signed)
[name], is a 48 y.o. female  DOB 08-26-1970  MRN [id number].  Admission date:  01/15/2019  Admitting Physician  Norval Morton, MD  Discharge Date:  01/20/2019   Primary MD  Burnis Medin, MD  Recommendations for primary care physician for things to follow:  -Please check CBC, BMP, LFTs during next visit -To follow with neurology patient, ambulatory referral has been sent - F/UAnti-aqauporin 4 antibody, pending at time discharge   Admission Diagnosis  Paresthesia [R20.2] Acute bilateral low back pain, unspecified whether sciatica present [M54.5] COVID-19 virus detected [U07.1]   Discharge Diagnosis  Paresthesia [R20.2] Acute bilateral low back pain, unspecified whether sciatica present [M54.5] COVID-19 virus detected [U07.1]    Principal Problem:   Other incomplete lesion at T10 level of thoracic spinal cord, initial encounter [facility]) Active Problems:   Lower extremity weakness   Pneumonia due to COVID-19 virus      Past Medical History:  Diagnosis Date   Hypertension    Hypoglycemia    Radiation 10/21/2015-10/23/2015   Anterior neck area 12 gray in 3 fractions   Teratoma     Past Surgical History:  Procedure Laterality Date   [place]  2013   to neck   DERMOID CYST  EXCISION     ECTOPIC PREGNANCY SURGERY     EXCISION MASS NECK Bilateral 10/20/2015   Procedure: EXCISION OF LARGE KELOIDS SEVERE RIGHT AND LEFT NECK WITH PLASTIC RECONSTRUCTION;  Surgeon: Cristine Polio, MD;  Location: [facility];  Service: Plastics;  Laterality: Bilateral;   HAND SURGERY     amputation of right index finger       History of present illness and  Hospital Course:     Kindly see H&P for history of present illness and admission details, please review complete Labs, Consult reports and  Test reports for all details in brief  HPI  from the history and physical done on the day of admission 01/15/2019 HPI: [name] is a 48 y.o. female with medical history significant of hypertension, keloids, and remote history of teratoma s/p removal; who presents with complaints of lower extremity numbness and weakness.  Symptoms started yesterday morning when she woke up and had some trouble getting out of bed and walking.  Complained of lower back pain with pain shooting down to her right foot.  Denies any recent falls or trauma.  She reports that the sensation in her right leg was decreased most notably in the foot and also noted some decreased sensation along the left leg.  Due to difficulty in walking she came into the emergency department for further evaluation.  Denies any fever, sick contacts, cough, shortness of breath, chest pain, nausea, vomiting,  abdominal pain, diarrhea, or dysuria symptoms.  She reports that she may have come in contact with somebody at the grocery store and that was COVID positive or possibly from 1 of her  [employer].  Denies any history of MS to her knowledge in her family.  ED Course: Upon admission to the emergency department patient was seen to have a temperature up to 99.9 F, and all other vital signs maintained.  Labs revealed relatively normal CBC and CMP.  COVID-19 screening positive.  Chest x-ray noted airspace opacities on the left concerning for pneumonia.  MRI imaging of the thoracic and lumbar spine revealed hemicord abnormality of T10, bilateral pulmonary infiltrates, and degenerative changes of the L4-S1.  Neurology consulted for MRI findings.  Patient was started on antibiotics of Rocephin and azithromycin.  Inflammatory markers pending.  TRH called to admit.     Hospital Course   48 y.o.femalewith medical history significant ofhypertension,keloids, and remote history of teratomas/premoval, presents with complaints of lower extremity  numbness and weakness, she was seen by neurology,  MRI brain with contrast which showed no demyelinating lesions. We also obtain MRI C-spine which showed punctate left hemi-cord signal abnormality at C4-C5. Reviewed her MRI T-spine obtained with contrast (prior T spine was without contrast),T10 hemicord lesion does not enhance.  Working diagnosis is transverse myelitis, she was started on IV steroids, and given the fact no further work-up is anticipated at [facility], she was transferred to [facility] on 01/17/2019 given her COVID  19+ status.    Right leg weakness2/2T10 hemicord lesion, nonenhancing and possible C4-C5 lesion.  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 -Neurology input greatly appreciated, differential diagnoses include postinfectious transverse myelitis(there are few case reports of COVID-19 and first myelitis per my discussion with neurology) versus systemic autoimmune conditionsuch MS, (rare conditions such asdrop mets, intramedullary tumor, spinal vascular lesions remote possibility). -ANA and ANCA,dsDNA, RF, ANA panel, antiphospholipid antibodies, ordered - F/UAnti-aqauporin 4 antibody is pending at time of discharge, -Discussed with the neurology, currently no indication for LP, specially her T10 lesion has not been enhancing with contrast , but LP can be considered as an outpatient if her symptoms worsen . -Continue with PT/OT -Recommendation to continue with IV steroids for total of 3 days, which finished today ,then outpatient follow-up with neurology.   Pneumonia due to COVID-19 -Chest x-ray significant for bilateral infiltrate concerning for pneumonia -No oxygen requirement currently, no indication for Remdesivire, or Actemra, she is on IV steroids for her neurological findingS. -Continue with vitamin C and zinc  Hypertension -Continue with home medication.  Discharge Condition:  Stable   Follow UP  Follow-up Information    Panosh, Standley Brooking, MD  Follow up today.   Specialties:  Internal Medicine, Pediatrics Contact information: [place] [id number] [phone]             Discharge Instructions  and  Discharge Medications     Discharge Instructions    Ambulatory referral to Neurology   Complete by:  As directed    An appointment is requested in approximately: 2 weeks -For bilateral lower extremity weakness, presumed transverse myelitis   Discharge instructions   Complete by:  As directed    Follow with Primary MD Panosh, Standley Brooking, MD in 7 days   Get CBC, CMP, checked  by Primary MD next visit.    Activity: As tolerated with Full fall precautions use walker/cane & assistance as needed   Disposition Home    Diet: Heart Healthy , with feeding assistance and aspiration precautions.  For Heart failure patients - Check your Weight same time everyday, if you gain over 2 pounds, or you develop in leg swelling, experience more shortness  of breath or chest pain, call your Primary MD immediately. Follow Cardiac Low Salt Diet and 1.5 lit/day fluid restriction.   On your next visit with your primary care physician please Get Medicines reviewed and adjusted.   Please request your Prim.MD to go over all Hospital Tests and Procedure/Radiological results at the follow up, please get all Hospital records sent to your Prim MD by signing hospital release before you go home.   If you experience worsening of your admission symptoms, develop shortness of breath, life threatening emergency, suicidal or homicidal thoughts you must seek medical attention immediately by calling 911 or calling your MD immediately  if symptoms less severe.  You Must read complete instructions/literature along with all the possible adverse reactions/side effects for all the Medicines you take and that have been prescribed to you. Take any new Medicines after you have completely understood and accpet all the possible adverse  reactions/side effects.   Do not drive, operating heavy machinery, perform activities at heights, swimming or participation in water activities or provide baby sitting services if your were admitted for syncope or siezures until you have seen by Primary MD or a Neurologist and advised to do so again.  Do not drive when taking Pain medications.    Do not take more than prescribed Pain, Sleep and Anxiety Medications  Special Instructions: If you have smoked or chewed Tobacco  in the last 2 yrs please stop smoking, stop any regular Alcohol  and or any Recreational drug use.  Wear Seat belts while driving.   Please note  You were cared for by a hospitalist during your hospital stay. If you have any questions about your discharge medications or the care you received while you were in the hospital after you are discharged, you can call the unit and asked to speak with the hospitalist on call if the hospitalist that took care of you is not available. Once you are discharged, your primary care physician will handle any further medical issues. Please note that NO REFILLS for any discharge medications will be authorized once you are discharged, as it is imperative that you return to your primary care physician (or establish a relationship with a primary care physician if you do not have one) for your aftercare needs so that they can reassess your need for medications and monitor your lab values.   Increase activity slowly   Complete by:  As directed      Allergies as of 01/20/2019      Reactions   Tylox [oxycodone-acetaminophen] Swelling   Caused face to swell.  Patient stated this happened a long time ago and since then has taken oxycodone with no problems      Medication List    STOP taking these medications   acetaminophen 500 MG tablet Commonly known as:  TYLENOL   naproxen 500 MG tablet Commonly known as:  NAPROSYN     TAKE these medications   ascorbic acid 500 MG tablet Commonly  known as:  VITAMIN C Take 1 tablet (500 mg total) by mouth daily. Please take for 2 weeks Start taking on:  January 21, 2019   methocarbamol 750 MG tablet Commonly known as:  ROBAXIN Take 1 tablet (750 mg total) by mouth 3 (three) times daily as needed (muscle spasm/pain).   Olmesartan-amLODIPine-HCTZ 40-5-25 MG Tabs Take 1 tablet by mouth daily.   zinc sulfate 220 (50 Zn) MG capsule Take 1 capsule (220 mg total) by mouth daily. Please take for 2 weeks  Start taking on:  January 21, 2019         Diet and Activity recommendation: See Discharge Instructions above   Consults obtained -  Neurolgoy   Major procedures and Radiology Reports - PLEASE review detailed and final reports for all details, in brief -     Mr Brain W And Wo Contrast  Result Date: 01/16/2019 CLINICAL DATA:  Spinal cord abnormality on thoracic MRI, T10 level, possible demyelinating lesion. Assess for disease elsewhere in CNS. Symptoms are low back pain and leg weakness. EXAM: MRI HEAD WITHOUT AND WITH CONTRAST TECHNIQUE: Multiplanar, multiecho pulse sequences of the brain and surrounding structures were obtained without and with intravenous contrast. CONTRAST:  Gadavist 10 mL. COMPARISON:  MRI thoracic spine earlier today. MRI cervical spine reported separately. FINDINGS: The patient was unable to remain motionless for the exam. Small or subtle lesions could be overlooked. Brain: No evidence for acute infarction, hemorrhage, mass lesion, hydrocephalus, or extra-axial fluid. Normal for age cerebral volume. No white matter disease. Specifically no demyelinating lesions. Post infusion, no abnormal enhancement of the brain or meninges. Vascular: Flow voids are maintained. Skull and upper cervical spine: Normal marrow signal. 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 Sinuses/Orbits: Negative. Other: None. IMPRESSION: Motion degraded exam demonstrates no acute or focal intracranial abnormality. Specifically, no  evidence for active or occult demyelinating lesion in the cerebral, cerebellar or brainstem white matter to suggest multiple sclerosis, CNS infection, or other significant abnormality. Electronically Signed   By: Staci Righter M.D.   On: 01/16/2019 15:00   Mr Thoracic Spine Wo Contrast  Result Date: 01/16/2019 CLINICAL DATA:  Mid back pain radiating down to the right leg. Numbness in the bilateral feet EXAM: MRI THORACIC SPINE WITHOUT CONTRAST TECHNIQUE: Multiplanar, multisequence MR imaging of the thoracic spine was performed. No intravenous contrast was administered. COMPARISON:  Chest x-ray 09/24/2018 FINDINGS: Alignment:  Negative for listhesis. Vertebrae: No fracture, evidence of discitis, or bone lesion. Cord: Best seen on sagittal STIR, but also seen on sagittal T2 weighted imaging there is a short segment hyperintensity in the right cord at T10. This area is not seen on axial slices due to slice selection. No cord expansion or volume loss. Paraspinal and other soft tissues: There is patchy bilateral lung opacity, most likely pneumonia-possibly atypical. Disc levels: Midthoracic disc narrowing with small protrusions at T6-7 to T8-9. These results were called by telephone at the time of interpretation on 01/16/2019 at 7:11 am to Dr. Abigail Butts , who verbally acknowledged these results. IMPRESSION: 1. Short segment right hemi cord signal abnormality at T10 which would usually reflect a demyelinating process. Suggest brain MR and postcontrast imaging. 2. Patchy bilateral lung opacity compatible with pneumonia. COVID-19 should be considered specifically. Electronically Signed   By: Monte Fantasia M.D.   On: 01/16/2019 07:12   Mr Thoracic Spine W Contrast  Result Date: 01/16/2019 CLINICAL DATA:  Continued surveillance abnormal noncontrast thoracic MRI. History of back pain and BILATERAL leg weakness. Patient is COVID-19 positive. EXAM: MRI THORACIC SPINE WITH CONTRAST TECHNIQUE: Multiplanar,  multisequence MR imaging of the thoracic spine was performed following the administration of intravenous contrast. COMPARISON:  MRI thoracic spine earlier today. FINDINGS: Alignment:  Standard. Vertebrae: No fracture, evidence of discitis, or bone lesion. Cord: Post infusion imaging demonstrates no abnormal enhancement of the thoracic cord at the T10 level, nor elsewhere. Paraspinal and other soft tissues: Patchy BILATERAL lung opacities, greater on the RIGHT, with a RIGHT pleural effusion. Disc levels: Unchanged from  priors.  Small protrusions at T6-7 and T8-9. IMPRESSION: No enhancing lesions are seen in the thoracic cord. The intramedullary lesion at T10 identified previously appears nonacute. BILATERAL pulmonary opacities RIGHT greater than LEFT, with RIGHT pleural effusion, similar to priors. Electronically Signed   By: Staci Righter M.D.   On: 01/16/2019 15:23   Mr Lumbar Spine Wo Contrast  Result Date: 01/16/2019 CLINICAL DATA:  Mid back pain radiating down the right leg EXAM: MRI LUMBAR SPINE WITHOUT CONTRAST TECHNIQUE: Multiplanar, multisequence MR imaging of the lumbar spine was performed. No intravenous contrast was administered. COMPARISON:  None. FINDINGS: Segmentation:  5 lumbar type vertebrae based on prior KUB Alignment:  Normal Vertebrae:  No fracture, evidence of discitis, or bone lesion. Conus medullaris and cauda equina: Conus extends to the L1-2 level. Conus and cauda equina appear normal. Paraspinal and other soft tissues: Negative Disc levels: L4-5: Disc narrowing and bulging with a central up turning protrusion. No impingement. L5-S1: Disc narrowing and bulging with endplate degeneration. No neural compression. 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 Electronically Signed   By: Monte Fantasia M.D.   On: 01/16/2019 05:19   Mr Cervical Spine W Or Wo Contrast  Result Date: 01/16/2019 CLINICAL DATA:  Low back pain and  leg weakness. Abnormal thoracic MRI. EXAM: MRI CERVICAL SPINE WITHOUT AND WITH CONTRAST TECHNIQUE: Multiplanar and multiecho pulse sequences of the cervical spine, to include the craniocervical junction and cervicothoracic junction, were obtained without and with intravenous contrast. CONTRAST:  Gadavist 10 mL. COMPARISON:  MRI brain reported separately. MRI thoracic spine earlier today. FINDINGS: Alignment: Reversal of normal cervical lordotic curve. No subluxation. Vertebrae: No fracture, evidence of discitis, or bone lesion. Cord: No demyelinating lesions in the cervical cord are seen. No abnormal postcontrast enhancement. Cord flattening is seen at C4-5 and C5-6 related to spondylosis, described below. There is a punctate focus of T2 hyperintensity in the LEFT lateral hemicord, C4-5 level, seen on series 35, image 20, which cannot be confirmed on sagittal imaging and is equivocal for demyelinating disease. The lesion is extremely small, no more than 1 mm diameter. Significance uncertain. Posterior Fossa, vertebral arteries, paraspinal tissues: Negative. Disc levels: C2-3: Annular bulge. Slight effacement anterior subarachnoid space and minimal ventral contact with the cord. No impingement. C3-4:  Annular bulge.  No impingement. C4-5: Shallow protrusion. Minimal cord flattening. There may be mild congenital stenosis. No C5 foraminal narrowing. C5-6: Annular bulge. Disc space narrowing. Effacement anterior subarachnoid space with minimal cord flattening greater on the LEFT. LEFT greater than RIGHT C6 foraminal narrowing. C6-7: Central protrusion. Effacement anterior subarachnoid space. Uncinate spurring results in RIGHT C7 foraminal narrowing. C7-T1:  Unremarkable. IMPRESSION: Other than a punctate focus of LEFT hemicord T2 hyperintensity seen only on axial imaging at the C4-5 level, no intrinsic abnormality can be seen in the cervical cord. Findings are not conclusive for demyelinating disease. Multilevel  spondylosis, with mild stenosis C4 through C7 related to disc pathology, osseous spurring, and reversal of the normal cervical lordotic curve. See discussion above. Electronically Signed   By: Staci Righter M.D.   On: 01/16/2019 15:18   Dg Chest Port 1 View  Result Date: 01/16/2019 CLINICAL DATA:  Chest tightness EXAM: PORTABLE CHEST 1 VIEW COMPARISON:  September 24, 2018 FINDINGS: There is atelectatic change in the lung bases. There is mild airspace opacity in the left base. The lungs elsewhere are clear. Heart is mildly enlarged with pulmonary vascularity normal. No adenopathy.  No bone lesions. IMPRESSION: Airspace opacity in the left base, felt to represent pneumonia. Bibasilar atelectasis also present. Lungs elsewhere are clear. Stable cardiac prominence. No adenopathy evident. Electronically Signed   By: Lowella Grip III M.D.   On: 01/16/2019 07:50    Micro Results    Recent Results (from the past 240 hour(s))  Urine culture     Status: Abnormal   Collection Time: 01/16/19  2:54 AM  Result Value Ref Range Status   Specimen Description URINE, CLEAN CATCH  Final   Special Requests   Final    NONE Performed at [facility], [address], [place], [place] [address]    Culture MULTIPLE SPECIES PRESENT, SUGGEST RECOLLECTION (A)  Final   Report Status 01/16/2019 FINAL  Final  SARS Coronavirus 2 (CEPHEID- Performed in [facility] lab), Hosp Order     Status: Abnormal   Collection Time: 01/16/19  7:12 AM  Result Value Ref Range Status   SARS Coronavirus 2 POSITIVE (A) NEGATIVE Final    Comment: RESULT CALLED TO, READ BACK BY AND VERIFIED WITH: RN N ZOHBI [id number] (NOTE) If result is NEGATIVE SARS-CoV-2 target nucleic acids are NOT DETECTED. The SARS-CoV-2 RNA is generally detectable in upper and lower  respiratory specimens during the acute phase of infection. The lowest  concentration of SARS-CoV-2 viral copies this assay can detect is 250  copies / mL. A  negative result does not preclude SARS-CoV-2 infection  and should not be used as the sole basis for treatment or other  patient management decisions.  A negative result may occur with  improper specimen collection / handling, submission of specimen other  than nasopharyngeal swab, presence of viral mutation(s) within the  areas targeted by this assay, and inadequate number of viral copies  (<250 copies / mL). A negative result must be combined with clinical  observations, patient history, and epidemiological information. If result is POSITIVE SARS-CoV-2 target nucleic acids are DETECTED. The SARS-C oV-2 RNA is generally detectable in upper and lower  respiratory specimens during the acute phase of infection.  Positive  results are indicative of active infection with SARS-CoV-2.  Clinical  correlation with patient history and other diagnostic information is  necessary to determine patient infection status.  Positive results do  not rule out bacterial infection or co-infection with other viruses. If result is PRESUMPTIVE POSTIVE SARS-CoV-2 nucleic acids MAY BE PRESENT.   A presumptive positive result was obtained on the submitted specimen  and confirmed on repeat testing.  While 2019 novel coronavirus  (SARS-CoV-2) nucleic acids may be present in the submitted sample  additional confirmatory testing may be necessary for epidemiological  and / or clinical management purposes  to differentiate between  SARS-CoV-2 and other Sarbecovirus currently known to infect humans.  If clinically indicated additional testing with an alternate test  methodology [phone]) is advised.  The SARS-CoV-2 RNA is generally  detectable in upper and lower respiratory specimens during the acute  phase of infection. The expected result is Negative. Fact Sheet for Patients:  StrictlyIdeas.no Fact Sheet for Healthcare Providers: BankingDealers.co.za This test is not  yet approved or cleared by the [place] FDA and has been authorized for detection and/or diagnosis of SARS-CoV-2 by FDA under an Emergency Use Authorization (EUA).  This EUA will remain in effect (meaning this test can be used) for the duration of the COVID-19 declaration under Section 564(b)(1) of the Act, 21 U.S.C. section 360bbb-3(b)(1), unless the authorization is terminated  or revoked sooner. Performed at [facility], [place] [address] [place], [place] [address]   Blood Culture (routine x 2)     Status: None (Preliminary result)   Collection Time: 01/16/19 11:00 AM  Result Value Ref Range Status   Specimen Description BLOOD RIGHT HAND  Final   Special Requests   Final    BOTTLES DRAWN AEROBIC AND ANAEROBIC Blood Culture adequate volume   Culture   Final    NO GROWTH 4 DAYS Performed at [facility], [place] [address] [place], [place] [address]    Report Status PENDING  Incomplete  Blood Culture (routine x 2)     Status: None (Preliminary result)   Collection Time: 01/16/19 11:05 AM  Result Value Ref Range Status   Specimen Description BLOOD RIGHT HAND  Final   Special Requests   Final    BOTTLES DRAWN AEROBIC ONLY Blood Culture results may not be optimal due to an inadequate volume of blood received in culture bottles   Culture   Final    NO GROWTH 4 DAYS Performed at [facility], [place] [address] [place], [place] [address]    Report Status PENDING  Incomplete       Today   Subjective:   [name] today has no headache,no chest abdominal pain, reports no significant change in her lower extremity weakness  Objective:   Blood pressure 125/88, pulse 67, temperature 98.1 F (36.7 C), temperature source Oral, resp. rate 15, height 6' (1.829 m), weight 115.6 kg, SpO2 92 %.   Intake/Output Summary (Last 24 hours) at 01/20/2019 1509 Last data filed at 01/20/2019 1300 Gross per 24 hour  Intake 1200 ml  Output --  Net 1200 ml    Exam Awake  Alert, Oriented x 3, No new F.N deficits, Normal affect Symmetrical Chest wall movement, Good air movement bilaterally, CTAB RRR,No Gallops,Rubs or new Murmurs, No Parasternal Heave +ve B.Sounds, Abd Soft, Non tender, No rebound -guarding or rigidity. No Cyanosis, Clubbing or edema, No new Rash or bruise  Data Review   CBC w Diff:  Lab Results  Component Value Date   WBC 14.2 (H) 01/20/2019   HGB 13.3 01/20/2019   HCT 40.3 01/20/2019   PLT 307 01/20/2019   LYMPHOPCT 15 01/20/2019   MONOPCT 7 01/20/2019   EOSPCT 0 01/20/2019   BASOPCT 0 01/20/2019    CMP:  Lab Results  Component Value Date   NA 132 (L) 01/20/2019   K 4.2 01/20/2019   CL 96 (L) 01/20/2019   CO2 25 01/20/2019   BUN 30 (H) 01/20/2019   CREATININE 0.82 01/20/2019   PROT 7.5 01/20/2019   ALBUMIN 3.8 01/20/2019   BILITOT 0.3 01/20/2019   ALKPHOS 83 01/20/2019   AST 22 01/20/2019   ALT 34 01/20/2019  .   Total Time in preparing paper work, data evaluation and todays exam - 19 minutes  Phillips Climes M.D on 01/20/2019 at 3:09 PM  [facility]   Office  [phone]

## 2019-01-20 NOTE — Discharge Instructions (Signed)
Person Under Monitoring Name: [name]  Location: [address] [place] [address]   Infection Prevention Recommendations for Individuals Confirmed to have, or Being Evaluated for, 2019 Novel Coronavirus (COVID-19) Infection Who Receive Care at Home  Individuals who are confirmed to have, or are being evaluated for, COVID-19 should follow the prevention steps below until a healthcare provider or local or state health department says they can return to normal activities.  Stay home except to get medical care You should restrict activities outside your home, except for getting medical care. Do not go to work, school, or public areas, and do not use public transportation or taxis.  Call ahead before visiting your doctor Before your medical appointment, call the healthcare provider and tell them that you have, or are being evaluated for, COVID-19 infection. This will help the healthcare providers office take steps to keep other people from getting infected. Ask your healthcare provider to call the local or state health department.  Monitor your symptoms Seek prompt medical attention if your illness is worsening (e.g., difficulty breathing). Before going to your medical appointment, call the healthcare provider and tell them that you have, or are being evaluated for, COVID-19 infection. Ask your healthcare provider to call the local or state health department.  Wear a facemask You should wear a facemask that covers your nose and mouth when you are in the same room with other people and when you visit a healthcare provider. People who live with or visit you should also wear a facemask while they are in the same room with you.  Separate yourself from other people in your home As much as possible, you should stay in a different room from other people in your home. Also, you should use a separate bathroom, if available.  Avoid sharing household items You should not share  dishes, drinking glasses, cups, eating utensils, towels, bedding, or other items with other people in your home. After using these items, you should wash them thoroughly with soap and water.  Cover your coughs and sneezes Cover your mouth and nose with a tissue when you cough or sneeze, or you can cough or sneeze into your sleeve. Throw used tissues in a lined trash can, and immediately wash your hands with soap and water for at least 20 seconds or use an alcohol-based hand rub.  Wash your [facility] your hands often and thoroughly with soap and water for at least 20 seconds. You can use an alcohol-based hand sanitizer if soap and water are not available and if your hands are not visibly dirty. Avoid touching your eyes, nose, and mouth with unwashed hands.   Prevention Steps for Caregivers and Household Members of Individuals Confirmed to have, or Being Evaluated for, COVID-19 Infection Being Cared for in the Home  If you live with, or provide care at home for, a person confirmed to have, or being evaluated for, COVID-19 infection please follow these guidelines to prevent infection:  Follow healthcare providers instructions Make sure that you understand and can help the patient follow any healthcare provider instructions for all care.  Provide for the patients basic needs You should help the patient with basic needs in the home and provide support for getting groceries, prescriptions, and other personal needs.  Monitor the patients symptoms If they are getting sicker, call his or her medical provider and tell them that the patient has, or is being evaluated for, COVID-19 infection. This will help the healthcare providers office  take steps to keep other people from getting infected. Ask the healthcare provider to call the local or state health department.  Limit the number of people who have contact with the patient  If possible, have only one caregiver for the patient.  Other  household members should stay in another home or place of residence. If this is not possible, they should stay  in another room, or be separated from the patient as much as possible. Use a separate bathroom, if available.  Restrict visitors who do not have an essential need to be in the home.  Keep older adults, very young children, and other sick people away from the patient Keep older adults, very young children, and those who have compromised immune systems or chronic health conditions away from the patient. This includes people with chronic heart, lung, or kidney conditions, diabetes, and cancer.  Ensure good ventilation Make sure that shared spaces in the home have good air flow, such as from an air conditioner or an opened window, weather permitting.  Wash your hands often  Wash your hands often and thoroughly with soap and water for at least 20 seconds. You can use an alcohol based hand sanitizer if soap and water are not available and if your hands are not visibly dirty.  Avoid touching your eyes, nose, and mouth with unwashed hands.  Use disposable paper towels to dry your hands. If not available, use dedicated cloth towels and replace them when they become wet.  Wear a facemask and gloves  Wear a disposable facemask at all times in the room and gloves when you touch or have contact with the patients blood, body fluids, and/or secretions or excretions, such as sweat, saliva, sputum, nasal mucus, vomit, urine, or feces.  Ensure the mask fits over your nose and mouth tightly, and do not touch it during use.  Throw out disposable facemasks and gloves after using them. Do not reuse.  Wash your hands immediately after removing your facemask and gloves.  If your personal clothing becomes contaminated, carefully remove clothing and launder. Wash your hands after handling contaminated clothing.  Place all used disposable facemasks, gloves, and other waste in a lined container before  disposing them with other household waste.  Remove gloves and wash your hands immediately after handling these items.  Do not share dishes, glasses, or other household items with the patient  Avoid sharing household items. You should not share dishes, drinking glasses, cups, eating utensils, towels, bedding, or other items with a patient who is confirmed to have, or being evaluated for, COVID-19 infection.  After the person uses these items, you should wash them thoroughly with soap and water.  Wash laundry thoroughly  Immediately remove and wash clothes or bedding that have blood, body fluids, and/or secretions or excretions, such as sweat, saliva, sputum, nasal mucus, vomit, urine, or feces, on them.  Wear gloves when handling laundry from the patient.  Read and follow directions on labels of laundry or clothing items and detergent. In general, wash and dry with the warmest temperatures recommended on the label.  Clean all areas the individual has used often  Clean all touchable surfaces, such as counters, tabletops, doorknobs, bathroom fixtures, toilets, phones, keyboards, tablets, and bedside tables, every day. Also, clean any surfaces that may have blood, body fluids, and/or secretions or excretions on them.  Wear gloves when cleaning surfaces the patient has come in contact with.  Use a diluted bleach solution (e.g., dilute bleach with 1 part  bleach and 10 parts water) or a household disinfectant with a label that says EPA-registered for coronaviruses. To make a bleach solution at home, add 1 tablespoon of bleach to 1 quart (4 cups) of water. For a larger supply, add  cup of bleach to 1 gallon (16 cups) of water.  Read labels of cleaning products and follow recommendations provided on product labels. Labels contain instructions for safe and effective use of the cleaning product including precautions you should take when applying the product, such as wearing gloves or eye protection  and making sure you have good ventilation during use of the product.  Remove gloves and wash hands immediately after cleaning.  Monitor yourself for signs and symptoms of illness Caregivers and household members are considered close contacts, should monitor their health, and will be asked to limit movement outside of the home to the extent possible. Follow the monitoring steps for close contacts listed on the symptom monitoring form.   ? If you have additional questions, contact your local health department or call the epidemiologist on call at [phone] (available 24/7). ? This guidance is subject to change. For the most up-to-date guidance from [facility], please refer to their website: [email or web address]

## 2019-01-20 NOTE — Progress Notes (Signed)
NURSING PROGRESS NOTE  [name] [id number] Discharge Data: 01/20/2019 6:08 PM Attending Provider: No att. providers found [id number], Standley Brooking, MD     [name] to be D/C'd Home per MD order.  Discussed with the patient the After Visit Summary and all questions fully answered. All IV's discontinued with no bleeding noted. All belongings returned to patient for patient to take home.   Last Vital Signs:  Blood pressure 125/88, pulse 67, temperature 98.1 F (36.7 C), temperature source Oral, resp. rate 15, height 6' (1.829 m), weight 115.6 kg, SpO2 92 %.  Discharge Medication List Allergies as of 01/20/2019      Reactions   Tylox [oxycodone-acetaminophen] Swelling   Caused face to swell.  Patient stated this happened a long time ago and since then has taken oxycodone with no problems      Medication List    STOP taking these medications   acetaminophen 500 MG tablet Commonly known as:  TYLENOL   naproxen 500 MG tablet Commonly known as:  NAPROSYN     TAKE these medications   ascorbic acid 500 MG tablet Commonly known as:  VITAMIN C Take 1 tablet (500 mg total) by mouth daily. Please take for 2 weeks Start taking on:  January 21, 2019   HYDROcodone-acetaminophen 5-325 MG tablet Commonly known as:  NORCO/VICODIN Take 1 tablet by mouth every 6 (six) hours as needed for severe pain.   methocarbamol 750 MG tablet Commonly known as:  ROBAXIN Take 1 tablet (750 mg total) by mouth 3 (three) times daily as needed (muscle spasm/pain).   Olmesartan-amLODIPine-HCTZ 40-5-25 MG Tabs Take 1 tablet by mouth daily.   zinc sulfate 220 (50 Zn) MG capsule Take 1 capsule (220 mg total) by mouth daily. Please take for 2 weeks Start taking on:  January 21, 2019

## 2019-01-21 LAB — CULTURE, BLOOD (ROUTINE X 2)
Culture: NO GROWTH
Culture: NO GROWTH
Special Requests: ADEQUATE

## 2019-01-23 ENCOUNTER — Telehealth: Payer: Self-pay

## 2019-01-23 NOTE — Telephone Encounter (Signed)
Lvm for pt stating that if she gets worse having difficulty breathing pt should go to ED Crm created

## 2019-01-24 NOTE — Progress Notes (Signed)
Virtual Visit via Video Note  I connected with@ on 01/25/19 at 10:45 AM EDT by a video enabled telemedicine application and verified that I am speaking with the correct person using two identifiers. Location patient: home Location provider:work or home office Persons participating in the virtual visit: patient, provider  [place] national recommendations  regarding COVID 19 pandemic   video visit is advised over in office visit for this patient.  Patient aware  of the limitations of evaluation and management by telemedicine and  availability of in person appointments. and agreed to proceed.   HPI: [name] presents for video visit She was recently discharged from the hospital where she was hospitalized presenting with acute mid back pain with radiation to her right leg and feeling of weakness and was noted to have a positive COVID test negative chest x-ray and denied respiratory symptoms albeit her chest CT showed opacities. She was also noted to have a lesion in the T-spine had neurologic evaluation antibody testing and lab work for demyelinating process etc. Since she has been home the pain is about the same was not really taking pain medicines because of constipation medicines better denies any urinary incontinence or falling. Is difficult for her to sleep or walk because of the pain. No history of same.  No fevers night sweats.  Public health is involved in their household that had [name] her husband had a respiratory infection and loss of taste and smell tested positive for COVID and is since improved and to go back to work.  He works at a [employer]. She is checked her temperature and pulse ox and they have been fine. Blood pressure had been fine in the hospital needs refill of her antihypertensive medication.   Admission date:  01/15/2019  Admitting Physician  Norval Morton, MD  Discharge Date:  01/20/2019   Primary MD  Regis Bill, Standley Brooking, MD  Recommendations for primary  care physician for things to follow:  -Please check CBC, BMP, LFTs during next visit -To follow with neurology patient, ambulatory referral has been sent - F/UAnti-aqauporin 4 antibody, pending at time discharge   Admission Diagnosis  Paresthesia [R20.2] Acute bilateral low back pain, unspecified whether sciatica present [M54.5] COVID-19 virus detected [U07.1]   Discharge Diagnosis  Paresthesia [R20.2] Acute bilateral low back pain, unspecified whether sciatica present [M54.5] COVID-19 virus detected [U07.1]    Principal Problem:   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  Past Medical History:  Diagnosis Date  . Hypertension   . Hypoglycemia   . Radiation 10/21/2015-10/23/2015   Anterior neck area 12 gray in 3 fractions  . Teratoma     Past Surgical History:  Procedure Laterality Date  . ABDOMINAL HERNIA REPAIR    . ABDOMINAL HYSTERECTOMY    . CESAREAN SECTION    . CRYOTHERAPY  2013   to neck  . DERMOID CYST  EXCISION    . ECTOPIC PREGNANCY SURGERY    . EXCISION MASS NECK Bilateral 10/20/2015   Procedure: EXCISION OF LARGE KELOIDS SEVERE RIGHT AND LEFT NECK WITH PLASTIC RECONSTRUCTION;  Surgeon: Cristine Polio, MD;  Location: [facility];  Service: Plastics;  Laterality: Bilateral;  . HAND SURGERY     amputation of right index finger    Family History  Problem Relation Age of Onset  .  Hypertension Other     Social History   Tobacco Use  . Smoking status: Never Smoker  . Smokeless tobacco: Never Used  Substance Use Topics  . Alcohol use: Yes    Comment: socially  . Drug use: No      Current Outpatient Medications:  .  HYDROcodone-acetaminophen (NORCO/VICODIN) 5-325 MG tablet, Take 1 tablet by mouth every 6 (six) hours as needed for severe pain., Disp: 10 tablet,  Rfl: 0 .  methocarbamol (ROBAXIN) 750 MG tablet, Take 1 tablet (750 mg total) by mouth 3 (three) times daily as needed (muscle spasm/pain). (Patient not taking: Reported on 01/16/2019), Disp: 15 tablet, Rfl: 0 .  Olmesartan-amLODIPine-HCTZ 40-5-25 MG TABS, Take 1 tablet by mouth daily., Disp: 90 tablet, Rfl: 2 .  vitamin C (VITAMIN C) 500 MG tablet, Take 1 tablet (500 mg total) by mouth daily. Please take for 2 weeks, Disp: , Rfl:  .  zinc sulfate 220 (50 Zn) MG capsule, Take 1 capsule (220 mg total) by mouth daily. Please take for 2 weeks, Disp: , Rfl:   EXAM: BP Readings from Last 3 Encounters:  01/20/19 125/88  09/24/18 (!) 134/101  09/24/18 (!) 148/102    VITALS per patient if applicable:  GENERAL: alert, oriented, appears well and in no acute distress  HEENT: atraumatic, conjunttiva clear, no obvious abnormalities on inspection of external nose and ears  NECK: normal movements of the head and neck  LUNGS: on inspection no signs of respiratory distress, breathing rate appears normal, no obvious gross SOB, gasping or wheezing  CV: no obvious cyanosis   PSYCH/NEURO: pleasant and cooperative, no obvious depression or anxiety, speech and thought processing grossly intact Lab Results  Component Value Date   WBC 14.2 (H) 01/20/2019   HGB 13.3 01/20/2019   HCT 40.3 01/20/2019   PLT 307 01/20/2019   GLUCOSE 209 (H) 01/20/2019   CHOL 228 (H) 12/20/2017   TRIG 80 01/17/2019   HDL 40.10 12/20/2017   LDLDIRECT 177.3 07/06/2013   LDLCALC 154 (H) 12/20/2017   ALT 34 01/20/2019   AST 22 01/20/2019   NA 132 (L) 01/20/2019   K 4.2 01/20/2019   CL 96 (L) 01/20/2019   CREATININE 0.82 01/20/2019   BUN 30 (H) 01/20/2019   CO2 25 01/20/2019   TSH 1.27 12/20/2017   HGBA1C 6.5 12/20/2017    ASSESSMENT AND PLAN:  Discussed the following assessment and plan: 1. Back pain with radiation  - Ambulatory referral to Neurology  2. Abnormal MRI, thoracic spine  - Ambulatory referral to  Neurology  3. 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 - Ambulatory referral to Neurology  5. Hospital discharge follow-up   6. Medication management   7. Essential hypertension Medication refilled  Was ok in  Hospital   Back pain with radiation ongoing  ? If weakness having   Evaluation for demyelinating disorder   And was covid pos  But she says no other sx   Despite dx of  Lung changes on CT   No fever current or desaturation and the back and leg pain with laying is most problematic  Ok to take the hydrocodone as the constipation is better and no incontinence   Counseled.  Her pain even when supine apparently is unrelenting and activity makes it worse. We will put in referral to neurology her AVS from the hospital reports that she was supposed to have a follow-up with Dr. Felecia Shelling it [facility] neurology but no appointment was made and no contact since then. No compelling reason to repeat any lab work today antibody studies pending We will need to plan for follow-up of her COVID am not aware of it being related to her back predicament but totally unsure of this with her current body of evidence.  Will opined to neurology in this regard. She has not had this back problem ever before. Her husband is going back to work cleared of COVID and her son is in Theme park manager.   Expectant management and discussion of plan and treatment with opportunity to ask questions and all were answered. The patient agreed with the plan and demonstrated an understanding of the instructions.   Advised to call back or seek an in-person evaluation if worsening  or having   further concerns .  In the interim. We will plan what ever follow-up after evaluation and plan.  Let us know if she needs note for work regard to the [place] etc. We can help coordinate care as needed.   Shanon Ace, MD

## 2019-01-25 ENCOUNTER — Telehealth: Payer: Self-pay | Admitting: Neurology

## 2019-01-25 ENCOUNTER — Other Ambulatory Visit: Payer: Self-pay

## 2019-01-25 ENCOUNTER — Encounter: Payer: Self-pay | Admitting: Internal Medicine

## 2019-01-25 ENCOUNTER — Ambulatory Visit (INDEPENDENT_AMBULATORY_CARE_PROVIDER_SITE_OTHER): Payer: BC Managed Care – PPO | Admitting: Internal Medicine

## 2019-01-25 DIAGNOSIS — M549 Dorsalgia, unspecified: Secondary | ICD-10-CM

## 2019-01-25 DIAGNOSIS — S24153A Other incomplete lesion at T7-T10 level of thoracic spinal cord, initial encounter: Secondary | ICD-10-CM | POA: Diagnosis not present

## 2019-01-25 DIAGNOSIS — U071 COVID-19: Secondary | ICD-10-CM

## 2019-01-25 DIAGNOSIS — Z09 Encounter for follow-up examination after completed treatment for conditions other than malignant neoplasm: Secondary | ICD-10-CM

## 2019-01-25 DIAGNOSIS — R937 Abnormal findings on diagnostic imaging of other parts of musculoskeletal system: Secondary | ICD-10-CM

## 2019-01-25 DIAGNOSIS — I10 Essential (primary) hypertension: Secondary | ICD-10-CM

## 2019-01-25 DIAGNOSIS — Z79899 Other long term (current) drug therapy: Secondary | ICD-10-CM

## 2019-01-25 MED ORDER — OLMESARTAN-AMLODIPINE-HCTZ 40-5-25 MG PO TABS: 1.0000 | ORAL_TABLET | Freq: Every day | ORAL | 2 refills | Status: DC

## 2019-01-25 NOTE — Telephone Encounter (Signed)
°  Due to current COVID 19 pandemic, our office is severely reducing in office visits until further notice, in order to minimize the risk to our patients and healthcare providers.    Called patient and scheduled a virtual visit with Dr. Felecia Shelling for 6/30. Patient verbalized understanding of the doxy.me process and I have sent an e-mail to [email or web address] with link and instructions as well as my name and our office number. Patient understands that they will receive a call from RN to update chart.   Pt understands that although there may be some limitations with this type of visit, we will take all precautions to reduce any security or privacy concerns.  Pt understands that this will be treated like an in office visit and we will file with pt's insurance, and there may be a patient responsible charge related to this service.

## 2019-01-31 ENCOUNTER — Ambulatory Visit: Payer: Self-pay | Admitting: Internal Medicine

## 2019-01-31 NOTE — Telephone Encounter (Signed)
Pt saw Dr. Regis Bill 01/25/2019 for back pain. States has worsened "Some" and pt cannot see neurologist until 02/13/2019. States has taken all of the hydrocodone prescribed. Reports pain "Is making my BP go up." States yesterday 140/104.  During call 130/91. Also reports she was able to go up stairs yesterday and "May have twisted wrong." [place] called practice, Tammy, will route to practice for Dr. Velora Mediate review. Care advise given, pt verbalizes understanding.   Reason for Disposition . [1] MODERATE back pain (e.g., interferes with normal activities) AND [2] present > 3 days  Answer Assessment - Initial Assessment Questions 1. ONSET: "When did the pain begin?"      *SAw Dr. Regis Bill 01/25/2019 2. LOCATION: "Where does it hurt?" (upper, mid or lower back)     *No Answer* 3. SEVERITY: "How bad is the pain?"  (e.g., Scale 1-10; mild, moderate, or severe)   - MILD (1-3): doesn't interfere with normal activities    - MODERATE (4-7): interferes with normal activities or awakens from sleep    - SEVERE (8-10): excruciating pain, unable to do any normal activities      *No Answer* 4. PATTERN: "Is the pain constant?" (e.g., yes, no; constant, intermittent)      *No Answer* 5. RADIATION: "Does the pain shoot into your legs or elsewhere?"     *No Answer* 6. CAUSE:  "What do you think is causing the back pain?"      *No Answer* 7. BACK OVERUSE:  "Any recent lifting of heavy objects, strenuous work or exercise?"     *No Answer* 8. MEDICATIONS: "What have you taken so far for the pain?" (e.g., nothing, acetaminophen, NSAIDS)     *No Answer* 9. NEUROLOGIC SYMPTOMS: "Do you have any weakness, numbness, or problems with bowel/bladder control?"     *No Answer* 10. OTHER SYMPTOMS: "Do you have any other symptoms?" (e.g., fever, abdominal pain, burning with urination, blood in urine)       no 11. PREGNANCY: "Is there any chance you are pregnant?" (e.g., yes, no; LMP)       *No Answer*  Protocols used: BACK  PAIN-A-AH

## 2019-01-31 NOTE — Telephone Encounter (Signed)
Called, LVM for pt to call office back so I can update her chart info prior to virtual visit on 02/13/19 with Dr. Felecia Shelling.

## 2019-02-01 ENCOUNTER — Encounter: Payer: Self-pay | Admitting: Internal Medicine

## 2019-02-01 ENCOUNTER — Telehealth: Payer: Self-pay | Admitting: Internal Medicine

## 2019-02-01 MED ORDER — HYDROCODONE-ACETAMINOPHEN 5-325 MG PO TABS: 1.0000 | ORAL_TABLET | Freq: Four times a day (QID) | ORAL | 0 refills | Status: DC | PRN

## 2019-02-01 NOTE — Addendum Note (Signed)
Addended byBurnis Medin on: 02/01/2019 08:44 AM   Modules accepted: Orders

## 2019-02-01 NOTE — Telephone Encounter (Addendum)
error:[id number] ° °

## 2019-02-01 NOTE — Telephone Encounter (Signed)
Copied from [place] [id number]. Topic: Quick Communication - See Telephone Encounter >> Feb 01, 2019  9:07 AM Grayling Congress, [place] wrote: CRM for notification. See Telephone encounter for: 02/01/19. >> Feb 01, 2019 10:21 AM Lennox Solders wrote: Pt is aware rx has been sent to pharm and to continue to monitor her bp

## 2019-02-01 NOTE — Telephone Encounter (Signed)
Lvm for pt to call back to notify her of refill being sent in and to monitor blood pressure crm created

## 2019-02-01 NOTE — Telephone Encounter (Signed)
I have send in a refill of the hydrocodone .Hope this helps    Some    Continue Bp monitoring    See if comes down when pain is improved . And if any new symptoms .

## 2019-02-07 ENCOUNTER — Telehealth: Payer: Self-pay | Admitting: Internal Medicine

## 2019-02-07 ENCOUNTER — Other Ambulatory Visit: Payer: Self-pay | Admitting: *Deleted

## 2019-02-07 DIAGNOSIS — Z20822 Contact with and (suspected) exposure to covid-19: Secondary | ICD-10-CM

## 2019-02-07 NOTE — Telephone Encounter (Signed)
The patient dropped off disability form  Fax the forms to: [phone] ATTN: Tomasita Crumble  [place] [address]  Disposition: Dr's folder

## 2019-02-09 NOTE — Telephone Encounter (Signed)
lvm for pt to call back.  

## 2019-02-09 NOTE — Telephone Encounter (Signed)
Tell patient that  The best I can do with the disability form is to say you have had a condition  That is under evaluation and  Sending you to a specialist for evaluation and you have been hospitalized .    And the condition is ongoing and not yet defined    And that  Future disability  Assessments would have to be through specialist .  You could consider  Having specialist help you with this  Also   But  If you want me to proceed with form  Would need  need   Dates of her out of work and current work status and   Ability to do her  ADLs etc .

## 2019-02-09 NOTE — Telephone Encounter (Signed)
Form is in red folder

## 2019-02-09 NOTE — Telephone Encounter (Signed)
Patient called back to speak to [facility] asking for a call back at Ph# [phone]

## 2019-02-09 NOTE — Telephone Encounter (Signed)
Pt is going to talk to specialist about it on Tuesday and get back to [place]

## 2019-02-10 LAB — NOVEL CORONAVIRUS, NAA: SARS-CoV-2, NAA: NOT DETECTED

## 2019-02-13 ENCOUNTER — Ambulatory Visit (INDEPENDENT_AMBULATORY_CARE_PROVIDER_SITE_OTHER): Payer: BC Managed Care – PPO | Admitting: Neurology

## 2019-02-13 ENCOUNTER — Encounter

## 2019-02-13 ENCOUNTER — Encounter: Payer: Self-pay | Admitting: Neurology

## 2019-02-13 ENCOUNTER — Other Ambulatory Visit: Payer: Self-pay

## 2019-02-13 DIAGNOSIS — R29898 Other symptoms and signs involving the musculoskeletal system: Secondary | ICD-10-CM

## 2019-02-13 DIAGNOSIS — J1282 Pneumonia due to coronavirus disease 2019: Secondary | ICD-10-CM

## 2019-02-13 DIAGNOSIS — R269 Unspecified abnormalities of gait and mobility: Secondary | ICD-10-CM

## 2019-02-13 DIAGNOSIS — J1289 Other viral pneumonia: Secondary | ICD-10-CM | POA: Diagnosis not present

## 2019-02-13 DIAGNOSIS — R200 Anesthesia of skin: Secondary | ICD-10-CM

## 2019-02-13 DIAGNOSIS — S24153A Other incomplete lesion at T7-T10 level of thoracic spinal cord, initial encounter: Secondary | ICD-10-CM

## 2019-02-13 DIAGNOSIS — U071 COVID-19: Secondary | ICD-10-CM

## 2019-02-13 MED ORDER — CYCLOBENZAPRINE HCL 5 MG PO TABS: 5.0000 mg | ORAL_TABLET | Freq: Three times a day (TID) | ORAL | 2 refills | Status: DC | PRN

## 2019-02-13 MED ORDER — GABAPENTIN 300 MG PO CAPS: 300.0000 mg | ORAL_CAPSULE | Freq: Three times a day (TID) | ORAL | 11 refills | Status: DC

## 2019-02-13 NOTE — Progress Notes (Signed)
[facility]  PATIENT: Janya R Esker DOB: [birth year]  REFERRING DOCTOR OR PCP: Shanon Ace MD SOURCE: Patient, notes from primary care, notes from emergency rooms and hospital stay 01/16/2019 through 01/19/2019 and lab work over the last month, multiple MRI reports were reviewed, multiple MRI images reviewed on PACS.  _________________________________   HISTORICAL  CHIEF COMPLAINT:  Chief Complaint  Patient presents with  . Other    Transverse myelitis  . Pain    HISTORY OF PRESENT ILLNESS:  Virtual Visit via Video Note I connected with Glyn R Toft  on 02/13/19 at 10:30 AM EDT by a video enabled telemedicine application and verified that I am speaking with the correct person.  I discussed the limitations of evaluation and management by telemedicine and the availability of in person appointments. The patient expressed understanding and agreed to proceed.  History of Present Illness: I had the pleasure of seeing your patient, Terita Hejl, at [facility] for neurologic consultation regarding her back pain and right greater than left leg dysesthesia and weakness.  She is a 48 yo woman who has lower back pain and right > left leg pain and dysesthesia.   She notes right foot pain radiating into the foot (sole > dorsum).   She has numbness in the region of pain.    She has less pain and no numbness in the left foot but there is a warm sensation at times.     Symptoms started about 5 weeks ago and came on within 1 day.   There was no preceding event such as heavy lifting or excessive activity.   Pain was more severe in her back initially and was located in the lower lumbar region.    She went to the ED.   MRI showed a small disc herniation at L4-L5 and disc protrusion at L5-S1 but no definite nerve root compression.  MRI of the thoracic spine showed a focus to the right in the spinal cord at T10 concerning for demyelination.  Therefore she was  admitted for further evaluation treatment.  The cervical spine MRI showed a small left hemicord focus adjacent to C4C5.  The brain did not show any abnormal foci.  None of the MRIs showed any enhancement.  I personally reviewed the MRIs of the spine and brain and concur with the official results.    Also noted was changes in the lungs worrisome for COVID-19 and she was tested while in the emergency room.   She was positive for SARS-COV-2 in the ED and was just tested again and is now negative.   Her husband also tested positive and he had mild respiratory symptoms but was never hospitalized.  Due to the combination of her symptoms and the COVID-19, she was initially admitted to [facility] and then transferred to [facility] for the second half of her admission.  She was discharged 01/20/2019.  She she reports no new symptoms since that time.  Currently, she has a lot of lower back pain and muscle spasms.   She has more pain if she climbs the stairs.   She uses a cane when she walks.  The right leg seems a little weak but there is fluctuating strength.   She can not stand on her toes as well on the right as the left and the right leg will give out.    She continues to have numbness in the top and bottom of her foot.  REVIEW OF SYSTEMS: Constitutional: No fevers, chills, sweats, or change in appetite Eyes: No visual changes, double vision, eye pain Ear, nose and throat: No hearing loss, ear pain, nasal congestion, sore throat Cardiovascular: No chest pain, palpitations Respiratory: No shortness of breath at rest or with exertion.   No wheezes GastrointestinaI: No nausea, vomiting, diarrhea, abdominal pain, fecal incontinence Genitourinary: No dysuria, urinary retention or frequency.  No nocturia. Musculoskeletal:Back pain.  Degenerative disc changes at L4-L5 and L5-S1.  Integumentary: No rash, pruritus, skin lesions Neurological: as above Psychiatric: No depression at this time.  No  anxiety Endocrine: No palpitations, diaphoresis, change in appetite, change in weigh or increased thirst Hematologic/Lymphatic: No anemia, purpura, petechiae. Allergic/Immunologic: No itchy/runny eyes, nasal congestion, recent allergic reactions, rashes  ALLERGIES: Allergies  Allergen Reactions  . Tylox [Oxycodone-Acetaminophen] Swelling    Caused face to swell.  Patient stated this happened a long time ago and since then has taken oxycodone with no problems    HOME MEDICATIONS:  Current Outpatient Medications:  .  cyclobenzaprine (FLEXERIL) 5 MG tablet, Take 1 tablet (5 mg total) by mouth 3 (three) times daily as needed for muscle spasms., Disp: 90 tablet, Rfl: 2 .  gabapentin (NEURONTIN) 300 MG capsule, Take 1 capsule (300 mg total) by mouth 3 (three) times daily., Disp: 90 capsule, Rfl: 11 .  HYDROcodone-acetaminophen (NORCO/VICODIN) 5-325 MG tablet, Take 1 tablet by mouth every 6 (six) hours as needed for severe pain., Disp: 20 tablet, Rfl: 0 .  methocarbamol (ROBAXIN) 750 MG tablet, Take 1 tablet (750 mg total) by mouth 3 (three) times daily as needed (muscle spasm/pain). (Patient not taking: Reported on 01/16/2019), Disp: 15 tablet, Rfl: 0 .  Olmesartan-amLODIPine-HCTZ 40-5-25 MG TABS, Take 1 tablet by mouth daily., Disp: 90 tablet, Rfl: 2 .  vitamin C (VITAMIN C) 500 MG tablet, Take 1 tablet (500 mg total) by mouth daily. Please take for 2 weeks, Disp: , Rfl:  .  zinc sulfate 220 (50 Zn) MG capsule, Take 1 capsule (220 mg total) by mouth daily. Please take for 2 weeks, Disp: , Rfl:   PAST MEDICAL HISTORY: Past Medical History:  Diagnosis Date  . Hypertension   . Hypoglycemia   . Radiation 10/21/2015-10/23/2015   Anterior neck area 12 gray in 3 fractions  . Teratoma     PAST SURGICAL HISTORY: Past Surgical History:  Procedure Laterality Date  . ABDOMINAL HERNIA REPAIR    . ABDOMINAL HYSTERECTOMY    . CESAREAN SECTION    . CRYOTHERAPY  2013   to neck  . DERMOID CYST  EXCISION     . ECTOPIC PREGNANCY SURGERY    . EXCISION MASS NECK Bilateral 10/20/2015   Procedure: EXCISION OF LARGE KELOIDS SEVERE RIGHT AND LEFT NECK WITH PLASTIC RECONSTRUCTION;  Surgeon: Cristine Polio, MD;  Location: [place];  Service: Plastics;  Laterality: Bilateral;  . HAND SURGERY     amputation of right index finger    FAMILY HISTORY: Family History  Problem Relation Age of Onset  . Hypertension Other     SOCIAL HISTORY:  Social History   Socioeconomic History  . Marital status: Married    Spouse name: Not on file  . Number of children: 2  . Years of education: Not on file  . Highest education level: Not on file  Occupational History  . Occupation: Therapist, music  Social Needs  . Financial resource strain: Not on file  . Food insecurity    Worry: Not on file  Inability: Not on file  . Transportation needs    Medical: Not on file    Non-medical: Not on file  Tobacco Use  . Smoking status: Never Smoker  . Smokeless tobacco: Never Used  Substance and Sexual Activity  . Alcohol use: Yes    Comment: socially  . Drug use: No  . Sexual activity: Yes    Birth control/protection: Surgical  Lifestyle  . Physical activity    Days per week: Not on file    Minutes per session: Not on file  . Stress: Not on file  Relationships  . Social Herbalist on phone: Not on file    Gets together: Not on file    Attends religious service: Not on file    Active member of club or organization: Not on file    Attends meetings of clubs or organizations: Not on file    Relationship status: Not on file  . Intimate partner violence    Fear of current or ex partner: Not on file    Emotionally abused: Not on file    Physically abused: Not on file    Forced sexual activity: Not on file  Other Topics Concern  . Not on file  Social History Narrative  . Not on file     PHYSICAL EXAM She is a well-developed well-nourished woman in no acute distress.  The  head is normocephalic and atraumatic.  Sclera are anicteric.  Visible skin appears normal.  The neck has a good range of motion.  Pharynx and tongue have normal appearance.  She is alert and fully oriented with fluent speech and good attention, knowledge and memory.  Extraocular muscles are intact.  Facial strength is normal.  Palatal elevation and tongue protrusion are midline.  She appears to have normal strength in the arms.  Rapid alternating movements and finger-nose-finger are performed well.     DIAGNOSTIC DATA (LABS, IMAGING, TESTING) - I reviewed patient records, labs, notes, testing and imaging myself where available.  Lab Results  Component Value Date   WBC 14.2 (H) 01/20/2019   HGB 13.3 01/20/2019   HCT 40.3 01/20/2019   MCV 90.0 01/20/2019   PLT 307 01/20/2019      Component Value Date/Time   NA 132 (L) 01/20/2019 0450   K 4.2 01/20/2019 0450   CL 96 (L) 01/20/2019 0450   CO2 25 01/20/2019 0450   GLUCOSE 209 (H) 01/20/2019 0450   BUN 30 (H) 01/20/2019 0450   CREATININE 0.82 01/20/2019 0450   CALCIUM 9.4 01/20/2019 0450   PROT 7.5 01/20/2019 0450   ALBUMIN 3.8 01/20/2019 0450   AST 22 01/20/2019 0450   ALT 34 01/20/2019 0450   ALKPHOS 83 01/20/2019 0450   BILITOT 0.3 01/20/2019 0450   GFRNONAA >60 01/20/2019 0450   GFRAA >60 01/20/2019 0450   Lab Results  Component Value Date   CHOL 228 (H) 12/20/2017   HDL 40.10 12/20/2017   LDLCALC 154 (H) 12/20/2017   LDLDIRECT 177.3 07/06/2013   TRIG 80 01/17/2019   CHOLHDL 6 12/20/2017   Lab Results  Component Value Date   HGBA1C 6.5 12/20/2017    Lab Results  Component Value Date   TSH 1.27 12/20/2017         ASSESSMENT AND PLAN  1. Other incomplete lesion at T10 level of thoracic spinal cord, initial encounter ([place])   2. Pneumonia due to COVID-19 virus   3. Gait disturbance       In  summary, Ms. Viti is a 48 year old woman with back pain and right greater than left leg dysesthesias with right leg  weakness.  Although she does have degenerative changes in the lumbar spine, there is no definite nerve root compression.  Therefore, it is more likely that her symptoms are due to the transverse myelitis adjacent to T10 towards the right.  Besides the thoracic spine focus, there is another possible focus in the cervical spine though it is less definite than the one of the thoracic spine.  Neither of the foci appear to be acute.  The brain was normal for age.    1.  The etiology of the transverse myelitis is uncertain.  This could be idiopathic or related to her COVID-19 infection.  Regardless, as the brain is normal,  the chance that this represents initial changes from [place] is probably in the 10-15% range.  I will hold off on checking CSF at this time.  However, we will need to recheck an MRI of the brain and spine in 6 to 12 months. 2.   To help with the dysesthetic pain I will add gabapentin.   Additionally Flexeril will be sent in to help with the spasms. 3.    Refer to physical therapy for the weakness and gait difficulties. 4.    I will see her back in the office in about 4 weeks for an in person evaluation to reassess and make changes to therapy as needed. 5.    She should call sooner if there are new or worsening neurologic symptoms.   Follow Up Instructions: I discussed the assessment and treatment plan with the patient. The patient was provided an opportunity to ask questions and all were answered. The patient agreed with the plan and demonstrated an understanding of the instructions.    The patient was advised to call back or seek an in-person evaluation if the symptoms worsen or if the condition fails to improve as anticipated.  I provided 45 minutes of non-face-to-face time during this encounter.   Ajamu Maxon A. Felecia Shelling, MD, [facility] 03/04/9469, 9:62 PM Certified in Neurology, Clinical Neurophysiology, Sleep Medicine and Neuroimaging  [facility] [address], [place]  [place], [place] [address] [phone]

## 2019-02-20 ENCOUNTER — Telehealth: Payer: Self-pay | Admitting: *Deleted

## 2019-02-20 NOTE — Telephone Encounter (Signed)
Called pt. Advised Dr. Felecia Shelling completed STD paperwork.  Scheduled 4 week f/u for 03/13/19 at 9am with Dr. Felecia Shelling.

## 2019-02-21 ENCOUNTER — Telehealth: Payer: Self-pay | Admitting: *Deleted

## 2019-02-21 NOTE — Telephone Encounter (Signed)
Pt CIgna form faxed today [phone]

## 2019-02-26 ENCOUNTER — Other Ambulatory Visit: Payer: Self-pay

## 2019-02-26 ENCOUNTER — Encounter: Payer: Self-pay | Admitting: Physical Therapy

## 2019-02-26 ENCOUNTER — Ambulatory Visit: Payer: BC Managed Care – PPO | Attending: Neurology | Admitting: Physical Therapy

## 2019-02-26 DIAGNOSIS — R29818 Other symptoms and signs involving the nervous system: Secondary | ICD-10-CM | POA: Insufficient documentation

## 2019-02-26 DIAGNOSIS — R252 Cramp and spasm: Secondary | ICD-10-CM | POA: Insufficient documentation

## 2019-02-26 DIAGNOSIS — M5414 Radiculopathy, thoracic region: Secondary | ICD-10-CM | POA: Diagnosis present

## 2019-02-26 DIAGNOSIS — M6281 Muscle weakness (generalized): Secondary | ICD-10-CM | POA: Insufficient documentation

## 2019-02-26 NOTE — Patient Instructions (Signed)
Access Code: [email or web address]  URL: [email or web address]  Date: 02/26/2019  Prepared by: Faustino Congress   Exercises  Hooklying Single Knee to Chest - 3 reps - 1 sets - 30 sec hold - 2x daily - 7x weekly  Supine Double Knee to Chest - 3 reps - 1 sets - 30 sec hold - 2x daily - 7x weekly  Supine Lower Trunk Rotation - 3 reps - 1 sets - 30 sec hold - 2x daily - 7x weekly  Standing Paraspinals Mobilization with Small Ball on Wall - 1-2 reps - 1 sets - 3-5 min hold - 2x daily - 7x weekly  Patient Education  Trigger Point Dry Needling

## 2019-02-26 NOTE — Therapy (Signed)
[facility] [address] [place], [place], [address] Phone: [phone]   Fax:  [phone]  Physical Therapy Evaluation  Patient Details  Name: [name] MRN: [id number] Date of Birth: July 04, 1971 Referring Provider (PT): Britt Bottom, MD   Encounter Date: 02/26/2019  PT End of Session - 02/26/19 0955    Visit Number  1    Number of Visits  16    Date for PT Re-Evaluation  04/23/19    Authorization Type  20 visit limit; deductible met    Authorization - Visit Number  1    Authorization - Number of Visits  20    PT Start Time  0900    PT Stop Time  0945    PT Time Calculation (min)  45 min    Activity Tolerance  Patient tolerated treatment well    Behavior During Therapy  [facility] for tasks assessed/performed       Past Medical History:  Diagnosis Date  . Hypertension   . Hypoglycemia   . Radiation 10/21/2015-10/23/2015   Anterior neck area 12 gray in 3 fractions  . Teratoma     Past Surgical History:  Procedure Laterality Date  . ABDOMINAL HERNIA REPAIR    . ABDOMINAL HYSTERECTOMY    . CESAREAN SECTION    . CRYOTHERAPY  2013   to neck  . DERMOID CYST  EXCISION    . ECTOPIC PREGNANCY SURGERY    . EXCISION MASS NECK Bilateral 10/20/2015   Procedure: EXCISION OF LARGE KELOIDS SEVERE RIGHT AND LEFT NECK WITH PLASTIC RECONSTRUCTION;  Surgeon: Cristine Polio, MD;  Location: [place];  Service: Plastics;  Laterality: Bilateral;  . HAND SURGERY     amputation of right index finger    There were no vitals filed for this visit.   Subjective Assessment - 02/26/19 0903    Subjective  Pt is a 48 y/o female who presents to OPPT for T10 transverse myelitis.  Pt presented to ED on 01/15/19 due to increased back pain and difficulty walking.  MRI revealed transverse myelitis, and COVID test also + at that time.  Pt presents today with continued back pain and difficulty with mobility.    Limitations   Standing;Walking;Sitting    How long can you sit comfortably?  10-15 min    Patient Stated Goals  walk better, improve sensation, decrease pain    Currently in Pain?  Yes    Pain Score  --   "it's a weird feeling" -did not rate; rated up to 7/10   Pain Location  Back    Pain Orientation  Mid;Lower;Right;Left    Pain Descriptors / Indicators  Spasm;Aching;Dull;Shooting;Sharp    Pain Type  Acute pain;Neuropathic pain    Pain Onset  More than a month ago    Pain Frequency  Intermittent    Aggravating Factors   sitting in car 10-15 min, stepping up/down off curb    Pain Relieving Factors  muscle relaxers, walking         [facility] PT Assessment - 02/26/19 0908      Assessment   Medical Diagnosis  T10 transverse myelitis causing right leg weakness and reduced gait.     Referring Provider (PT)  Felecia Shelling, Nanine Means, MD    Onset Date/Surgical Date  01/15/19    Hand Dominance  Right    Next MD Visit  03/13/19    Prior Therapy  acute care      Precautions  Precautions  Fall      Restrictions   Weight Bearing Restrictions  No      Balance Screen   Has the patient fallen in the past 6 months  No    Has the patient had a decrease in activity level because of a fear of falling?   Yes    Is the patient reluctant to leave their home because of a fear of falling?   No      Home Environment   Living Environment  Private residence    Living Arrangements  Spouse/significant other;Children   39 y/o son   Available Help at Discharge  Family;Available 24 hours/day    Type of Home  House   townhouse   Home Access  Stairs to enter    Entrance Stairs-Number of Steps  1   curb and single step   Entrance Stairs-Rails  None    Home Layout  Two level   sleeping downstairs in recliner   Alternate Level Stairs-Number of Steps  15    Alternate Level Stairs-Rails  Left    Home Equipment  [place] - single point      Prior Function   Level of Independence  Independent    Vocation  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, some lfifting, driving forklift    Leisure  shopping, Armed forces training and education officer, driving, visit nursing homes, active with church Engineer, materials), read, dancing      Cognition   Overall Cognitive Status  Within Functional Limits for tasks assessed      Posture/Postural Control   Posture/Postural Control  Postural limitations    Postural Limitations  Rounded Shoulders;Forward head;Increased thoracic kyphosis      ROM / Strength   AROM / PROM / Strength  Strength;AROM      AROM   Overall AROM Comments  lumbar flexion limited 50% with increased pain with repetition      Strength   Overall Strength Comments  tested in sitting    Strength Assessment Site  Hip;Knee;Ankle    Right/Left Hip  Right;Left    Right Hip Flexion  3-/5    Left Hip Flexion  4/5    Right/Left Knee  Right;Left    Right Knee Flexion  4/5    Right Knee Extension  5/5    Left Knee Flexion  4/5    Left Knee Extension  5/5    Right/Left Ankle  Right;Left    Right Ankle Dorsiflexion  4/5    Left Ankle Dorsiflexion  4/5      Palpation   Palpation comment  very tender to light palpation with tightness noted in thoracic/lumbar paraspinals and Rt QL      Bed Mobility   Bed Mobility  Sit to Supine    Sit to Supine  Supervision/Verbal cueing   cues for proper technique to minimize back pain     Ambulation/Gait   Ambulation/Gait  Yes    Ambulation/Gait Assistance  5: Supervision    Ambulation Distance (Feet)  100 Feet    Assistive device  Straight cane    Gait Pattern  Decreased stance time - right;Decreased step length - left;Decreased hip/knee flexion - right;Decreased trunk rotation;Trendelenburg    Gait velocity  0.73 ft/sec                Objective measurements completed on examination: See above findings.      [facility] Adult PT Treatment/Exercise - 02/26/19  0908      Self-Care   Self-Care  Other Self-Care Comments    Other  Self-Care Comments   instructed in proper bed mobility and use of tennis ball for self-mobilization      Exercises   Exercises  Lumbar      Lumbar Exercises: Stretches   Single Knee to Chest Stretch  Right;Left;1 rep;10 seconds    Lower Trunk Rotation  2 reps;10 seconds             PT Education - 02/26/19 0955    Education Details  HEP, DN, see self care    Person(s) Educated  Patient    Methods  Explanation;Demonstration;Handout    Comprehension  Verbalized understanding;Returned demonstration;Need further instruction       PT Short Term Goals - 02/26/19 1129      PT SHORT TERM GOAL #1   Title  independent with initial HEP    Status  New    Target Date  03/26/19      PT SHORT TERM GOAL #2   Title  report pain < 4/10 for improved activity tolerance    Status  New    Target Date  03/26/19      PT SHORT TERM GOAL #3   Title  report ability to ride in car for at least 30 min without increase in pain for improved tolerance    Status  New    Target Date  03/26/19      PT SHORT TERM GOAL #4   Title  balance to be assessed with formal LTGs to be written    Status  New    Target Date  03/26/19        PT Long Term Goals - 02/26/19 1130      PT LONG TERM GOAL #1   Title  independent with advanced HEP    Status  New    Target Date  04/23/19      PT LONG TERM GOAL #2   Title  gait velocity improved to >/= 1.8 ft/sec for decreased fall risk    Status  New    Target Date  04/23/19      PT LONG TERM GOAL #3   Title  amb> 500' with LRAD mod I without increase in pain for improved mobility    Status  New    Target Date  04/23/19      PT LONG TERM GOAL #4   Title  negotiate stairs modified independent for improved mobility    Status  New    Target Date  04/23/19             Plan - 02/26/19 1122    Clinical Impression Statement  Pt is a 48 y/o female who presents to OPPT s/p hospitalization for T10 transverse myelitis and demonstates decreased strength,  ROM, balance as well as gait abnormalities. Pain is main limiting factor at this time, so HEP initiated to help with gentle mobilization.  Feel pt may benefit from DN to help with spasms.  Will benefit from PT to address deficits listed.    Personal Factors and Comorbidities  Comorbidity 2    Comorbidities  HTN, teratoma, incomplete lesion at T10    Examination-Activity Limitations  Sit;Sleep;Stand;Carry;Stairs;Locomotion Level    Examination-Participation Restrictions  Church;Community Activity;Other;Driving   work   Merchant navy officer  Evolving/Moderate complexity    Clinical Decision Making  Moderate    Rehab Potential  Good    PT Frequency  2x / week    PT Duration  8 weeks    PT Treatment/Interventions  ADLs/Self Care Home Management;Aquatic Therapy;Cryotherapy;Electrical Stimulation;Moist Heat;Traction;Balance training;Therapeutic exercise;Therapeutic activities;Functional mobility training;Stair training;Gait training;Ultrasound;Neuromuscular re-education;Patient/family education;Manual techniques;Taping;Dry needling    PT Next Visit Plan  review stretches, consider DN to paraspinals and QL if pt willing, progress exercises as able, needs balance assessment once pain more controlled    PT Home Exercise Plan  Access Code: [email or web address]    Consulted and Agree with Plan of Care  Patient       Patient will benefit from skilled therapeutic intervention in order to improve the following deficits and impairments:  Abnormal gait, Pain, Postural dysfunction, Impaired flexibility, Increased muscle spasms, Increased fascial restricitons, Decreased strength, Decreased balance, Decreased mobility, Difficulty walking, Decreased range of motion  Visit Diagnosis: 1. Radiculopathy, thoracic region   2. Cramp and spasm   3. Other symptoms and signs involving the nervous system   4. Muscle weakness (generalized)        Problem List Patient Active Problem List   Diagnosis Date Noted  .  Numbness 02/13/2019  . Right leg weakness 02/13/2019  . Lower extremity weakness 01/16/2019  . Other incomplete lesion at T10 level of thoracic spinal cord, initial encounter ([place]) 01/16/2019  . Pneumonia due to COVID-19 virus 01/16/2019  . Notalgia 08/10/2017  . Morbid obesity ([place]) 02/18/2017  . OSA (obstructive sleep apnea) 01/24/2017  . 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 Snoring 10/22/2016  . Nocturnal dyspnea/choking   10/22/2016  . Keloid of skin 09/25/2015  . Essential hypertension, benign 09/27/2014  . Teratoma 08/07/2014  . Severe hypertension 06/08/2013  . HYPERLIPIDEMIA, MILD, WITH LOW HDL 07/23/2009  . SHORTNESS OF BREATH 02/11/2009  . HYPERTENSION 05/07/2008  . KELOID SCAR 05/07/2008  . HEADACHE 05/22/2007      Laureen Abrahams, PT, DPT 02/26/19 11:35 AM     [facility] [address] [place], [place], [address] Phone: [phone]   Fax:  [phone]  Name: SHAMYIA GRANDPRE MRN: [id number] Date of Birth: 1971/06/02

## 2019-03-01 ENCOUNTER — Encounter: Payer: Self-pay | Admitting: Physical Therapy

## 2019-03-01 ENCOUNTER — Ambulatory Visit: Payer: BC Managed Care – PPO | Admitting: Physical Therapy

## 2019-03-01 ENCOUNTER — Other Ambulatory Visit: Payer: Self-pay

## 2019-03-01 DIAGNOSIS — R29818 Other symptoms and signs involving the nervous system: Secondary | ICD-10-CM

## 2019-03-01 DIAGNOSIS — M6281 Muscle weakness (generalized): Secondary | ICD-10-CM

## 2019-03-01 DIAGNOSIS — M5414 Radiculopathy, thoracic region: Secondary | ICD-10-CM

## 2019-03-01 DIAGNOSIS — R252 Cramp and spasm: Secondary | ICD-10-CM
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
## 2019-03-01 NOTE — Patient Instructions (Addendum)
Transverse Myelitis  Transverse myelitis is a condition that causes inflammation of the spinal cord. The inflammation affects the fatty lining that covers spinal cord nerves (myelin). It can cause scarring of nerves, which can interfere with nerve signals passing to and from the spinal cord. Signs and symptoms of this condition happen at the affected level of the spinal cord and below. The condition most often causes weakness of the arms or legs, pain, changes in feeling (sensation) in the arms or legs, and bowel and bladder problems. What are the causes? The exact cause of this condition is not known. It sometimes develops after a viral infection, such as herpes, chicken pox, cytomegalovirus, HIV, or Epstein-Barr virus. It can also occur after a bacterial infection or along with diseases that make the body's defense system (immune system) mistakenly attack healthy tissues (autoimmune diseases). What increases the risk? You are more likely to develop this condition if you have an autoimmune disease, such as multiple sclerosis, neuromyelitis optica, or lupus. What are the signs or symptoms? Symptoms of this condition may start suddenly within hours or develop gradually over weeks. Symptoms include:  Pain, especially in the neck, chest, or back, with shooting pains into the legs.  Weakness of the arms or the legs.  Difficulty walking, including foot dragging and stumbling.  Abnormal sensations, such as burning, prickling, numbness, or tingling, in the arms or legs.  Increased sensitivity to touch or changes in temperature.  Bowel and bladder problems, including an increased need to go, loss of control, and difficulty going to the bathroom. Other symptoms include:  Fatigue.  Fever.  Loss of appetite.  Headache.  Difficulty breathing.  Paralysis. How is this diagnosed? This condition may be diagnosed with a neurological exam. During this exam, your health care provider will ask  about your symptoms and do a complete physical exam to check your spinal cord function. You may need to see a nervous system specialist (neurologist) to have tests. Tests may include:  An MRI to check for inflammation or scarring in the spinal cord.  Blood tests to check for: ? Infections that can trigger this condition. ? Neuromyelitis optica.  A lumbar puncture to check your spinal fluid for signs of infection or inflammation. For this procedure, a small amount of the fluid that surrounds the brain and spinal cord is removed and examined. How is this treated? There is no cure for this condition. You may have treatment to reduce inflammation and manage symptoms. Often, treatment and monitoring are needed in the hospital setting. This condition may be treated with:  Pain medicine.  Corticosteroid medicines to reduce inflammation. These are usually given through an IV at first. Later, they may be taken by mouth.  Breathing support with a device called a respirator.  Physical therapy to: ? Reduce the risk of bedsores. ? Improve muscle strength, flexibility, coordination, and range of motion in affected muscles. ? Reduce muscle spasms and muscle wasting in paralyzed arms or legs. ? Improve control over your bladder and bowel.  Occupational therapy. This therapy helps you learn how to care for yourself and do everyday tasks such as bathing and dressing. It cannot reverse problems caused by this condition, but it can help you become as independent as possible. Follow these instructions at home:  Take over-the-counter and prescription medicines only as told by your health care provider.  Rest at home as told by your health care provider until you start to recover strength and movement. Ask your health  care provider what activities are safe for you.  Do exercises as told by your health care provider.  Keep all follow-up visits as told by your health care provider. This is  important. Contact a health care provider if:  Your symptoms are not improving or are getting worse.  You have symptoms that come back after going away.  You are having a hard time managing at home. Get help right away if you:  Cannot care for yourself at home.  Have trouble breathing. Summary  Transverse myelitis is a condition that causes inflammation of the spinal cord. Signs and symptoms of this condition happen at the affected level of the spinal cord and below.  There is no cure for this condition. You may have treatment in the hospital setting to reduce inflammation and manage symptoms.  Treatment may include pain medicine, corticosteroid medicines, breathing support with a respirator, physical therapy, and occupational therapy.  Rest at home as told by your health care provider until you start to recover strength and movement. Ask your health care provider what activities are safe for you.  Keep all follow-up visits as told by your health care provider. This is important. This information is not intended to replace advice given to you by your health care provider. Make sure you discuss any questions you have with your health care provider. Document Released: 07/23/2002 Document Revised: 06/29/2018 Document Reviewed: 06/29/2018 Elsevier Patient Education  2020 [employer].

## 2019-03-02 ENCOUNTER — Telehealth: Payer: Self-pay | Admitting: Physical Therapy
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
## 2019-03-06 ENCOUNTER — Ambulatory Visit (INDEPENDENT_AMBULATORY_CARE_PROVIDER_SITE_OTHER): Payer: BC Managed Care – PPO | Admitting: Internal Medicine

## 2019-03-06 ENCOUNTER — Other Ambulatory Visit: Payer: Self-pay

## 2019-03-06 ENCOUNTER — Encounter: Payer: Self-pay | Admitting: Internal Medicine

## 2019-03-06 VITALS — BP 126/78 | HR 118 | Temp 98.2°F | Wt 258.4 lb

## 2019-03-06 DIAGNOSIS — S29012A Strain of muscle and tendon of back wall of thorax, initial encounter: Secondary | ICD-10-CM | POA: Diagnosis not present

## 2019-03-06 DIAGNOSIS — M62838 Other muscle spasm: Secondary | ICD-10-CM | POA: Diagnosis not present
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
## 2019-03-08 ENCOUNTER — Telehealth: Payer: Self-pay | Admitting: Neurology

## 2019-03-08 NOTE — Telephone Encounter (Signed)
Spoke with patient over the phone and received payment information for disability forms. She authorized a $50 payment. Documentation given to Hilda Blades in Medical Records for processing.

## 2019-03-09 ENCOUNTER — Encounter: Payer: Self-pay | Admitting: Physical Therapy

## 2019-03-09 ENCOUNTER — Other Ambulatory Visit: Payer: Self-pay

## 2019-03-09 ENCOUNTER — Ambulatory Visit: Payer: BC Managed Care – PPO | Admitting: Physical Therapy

## 2019-03-09 DIAGNOSIS — M5414 Radiculopathy, thoracic region: Secondary | ICD-10-CM

## 2019-03-09 DIAGNOSIS — R29818 Other symptoms and signs involving the nervous system: Secondary | ICD-10-CM

## 2019-03-09 DIAGNOSIS — M6281 Muscle weakness (generalized): Secondary | ICD-10-CM

## 2019-03-09 DIAGNOSIS — R252 Cramp and spasm: Secondary | ICD-10-CM
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
## 2019-03-12 ENCOUNTER — Encounter: Payer: Self-pay | Admitting: Physical Therapy

## 2019-03-12 ENCOUNTER — Other Ambulatory Visit: Payer: Self-pay

## 2019-03-12 ENCOUNTER — Ambulatory Visit: Payer: BC Managed Care – PPO | Admitting: Physical Therapy

## 2019-03-12 DIAGNOSIS — Z0289 Encounter for other administrative examinations: Secondary | ICD-10-CM

## 2019-03-12 DIAGNOSIS — R29818 Other symptoms and signs involving the nervous system: Secondary | ICD-10-CM

## 2019-03-12 DIAGNOSIS — M5414 Radiculopathy, thoracic region: Secondary | ICD-10-CM | POA: Diagnosis not present

## 2019-03-12 DIAGNOSIS — M6281 Muscle weakness (generalized): Secondary | ICD-10-CM

## 2019-03-12 DIAGNOSIS — R252 Cramp and spasm: Secondary | ICD-10-CM
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
## 2019-03-13 ENCOUNTER — Ambulatory Visit (INDEPENDENT_AMBULATORY_CARE_PROVIDER_SITE_OTHER): Payer: BC Managed Care – PPO | Admitting: Neurology

## 2019-03-13 ENCOUNTER — Encounter: Payer: Self-pay | Admitting: Neurology

## 2019-03-13 ENCOUNTER — Telehealth: Payer: Self-pay | Admitting: *Deleted

## 2019-03-13 VITALS — BP 121/81 | HR 85 | Temp 96.4°F | Ht 72.0 in | Wt 258.5 lb

## 2019-03-13 DIAGNOSIS — U071 COVID-19: Secondary | ICD-10-CM | POA: Diagnosis not present

## 2019-03-13 DIAGNOSIS — S24153A Other incomplete lesion at T7-T10 level of thoracic spinal cord, initial encounter: Secondary | ICD-10-CM

## 2019-03-13 DIAGNOSIS — J1282 Pneumonia due to coronavirus disease 2019: Secondary | ICD-10-CM

## 2019-03-13 DIAGNOSIS — J1289 Other viral pneumonia: Secondary | ICD-10-CM

## 2019-03-13 DIAGNOSIS — R29898 Other symptoms and signs involving the musculoskeletal system: Secondary | ICD-10-CM | POA: Diagnosis not present

## 2019-03-13 DIAGNOSIS — R200 Anesthesia of skin: Secondary | ICD-10-CM | POA: Diagnosis not present

## 2019-03-13 DIAGNOSIS — G373 Acute transverse myelitis in demyelinating disease of central nervous system: Secondary | ICD-10-CM

## 2019-03-13 MED ORDER — BACLOFEN 10 MG PO TABS: 10.0000 mg | ORAL_TABLET | Freq: Three times a day (TID) | ORAL | 3 refills | Status: DC

## 2019-03-13 NOTE — Telephone Encounter (Signed)
Gave completed/signed disability forms ([employer]) back to medical records to process for pt.

## 2019-03-13 NOTE — Progress Notes (Signed)
[facility]  PATIENT: [name] DOB: 05/09/1971  REFERRING DOCTOR OR PCP: Shanon Ace MD SOURCE: Patient, notes from primary care, notes from emergency rooms and hospital stay 01/16/2019 through 01/19/2019 and lab work over the last month, multiple MRI reports were reviewed, multiple MRI images reviewed on PACS.  _________________________________   HISTORICAL  CHIEF COMPLAINT:  Chief Complaint  Patient presents with  . Follow-up    RM 13, alone. Last seen 02/13/2019. Needs updated forms filled out.   . Gait Problem    Ambulates with cane. Going to PT twice weekly, helping some. No falls since last seen. She has had near falls.    HISTORY OF PRESENT ILLNESS:  Update 03/13/2019: She was admitted for a transverse myelitis 01/16/2019 leading to difficulties with gait  She is doing PT for her gait but still uses a cane.   She can do short distances without her cane (though often touches/hold walls and furniture.  For longer distances, still needs to use the cane.   She stumbles.   Compared to 02/13/19, she is doing a little better .   She is most likel to be off balanced if she changes surface levels (I.e. a curb or single step).    Her left leg now feels normal.  The right leg is weak but better than last month.    She denies any bladder changes -- some bladder urgency but same as before the transverse myelitis.    She has numbness in her right foot > leg.  She has tingling only in her leg but numbness in her foot and toes.  Vision is doing well.      Of note, she had Covid-19 diagnosed at the time of the transverse myelitis.     From 02/13/19 I had the pleasure of seeing your patient, [name], at [facility] for neurologic consultation regarding her back pain and right greater than left leg dysesthesia and weakness.  She is a 48 yo woman who has lower back pain and right > left leg pain and dysesthesia.   She notes right foot pain  radiating into the foot (sole > dorsum).   She has numbness in the region of pain.    She has less pain and no numbness in the left foot but there is a warm sensation at times.     Symptoms started about 5 weeks ago and came on within 1 day.   There was no preceding event such as heavy lifting or excessive activity.   Pain was more severe in her back initially and was located in the lower lumbar region.    She went to the ED.   MRI showed a small disc herniation at L4-L5 and disc protrusion at L5-S1 but no definite nerve root compression.  MRI of the thoracic spine showed a focus to the right in the spinal cord at T10 concerning for demyelination.  Therefore she was admitted for further evaluation treatment.  The cervical spine MRI showed a small left hemicord focus adjacent to C4C5.  The brain did not show any abnormal foci.  None of the MRIs showed any enhancement.  I personally reviewed the MRIs of the spine and brain and concur with the official results.    Also noted was changes in the lungs worrisome for COVID-19 and she was tested while in the emergency room.   She was positive for SARS-COV-2 in the ED and was just tested again and is now negative.  Her husband also tested positive and he had mild respiratory symptoms but was never hospitalized.  Due to the combination of her symptoms and the COVID-19, she was initially admitted to [facility] and then transferred to [facility] for the second half of her admission.  She was discharged 01/20/2019.  She she reports no new symptoms since that time.  Currently, she has a lot of lower back pain and muscle spasms.   She has more pain if she climbs the stairs.   She uses a cane when she walks.  The right leg seems a little weak but there is fluctuating strength.   She can not stand on her toes as well on the right as the left and the right leg will give out.    She continues to have numbness in the top and bottom of her foot.       REVIEW OF SYSTEMS:  Constitutional: No fevers, chills, sweats, or change in appetite Eyes: No visual changes, double vision, eye pain Ear, nose and throat: No hearing loss, ear pain, nasal congestion, sore throat Cardiovascular: No chest pain, palpitations Respiratory: No shortness of breath at rest or with exertion.   No wheezes GastrointestinaI: No nausea, vomiting, diarrhea, abdominal pain, fecal incontinence Genitourinary: No dysuria, urinary retention or frequency.  No nocturia. Musculoskeletal:Back pain.  Degenerative disc changes at L4-L5 and L5-S1.   Integumentary: No rash, pruritus, skin lesions Neurological: as above Psychiatric: No depression at this time.  No anxiety Endocrine: No palpitations, diaphoresis, change in appetite, change in weigh or increased thirst Hematologic/Lymphatic: No anemia, purpura, petechiae. Allergic/Immunologic: No itchy/runny eyes, nasal congestion, recent allergic reactions, rashes  ALLERGIES: Allergies  Allergen Reactions  . Tylox [Oxycodone-Acetaminophen] Swelling    Caused face to swell.  Patient stated this happened a long time ago and since then has taken oxycodone with no problems    HOME MEDICATIONS:  Current Outpatient Medications:  .  cyclobenzaprine (FLEXERIL) 5 MG tablet, Take 1 tablet (5 mg total) by mouth 3 (three) times daily as needed for muscle spasms., Disp: 90 tablet, Rfl: 2 .  gabapentin (NEURONTIN) 300 MG capsule, Take 1 capsule (300 mg total) by mouth 3 (three) times daily., Disp: 90 capsule, Rfl: 11 .  HYDROcodone-acetaminophen (NORCO/VICODIN) 5-325 MG tablet, Take 1 tablet by mouth every 6 (six) hours as needed for severe pain., Disp: 20 tablet, Rfl: 0 .  Olmesartan-amLODIPine-HCTZ 40-5-25 MG TABS, Take 1 tablet by mouth daily., Disp: 90 tablet, Rfl: 2 .  vitamin C (VITAMIN C) 500 MG tablet, Take 1 tablet (500 mg total) by mouth daily. Please take for 2 weeks, Disp: , Rfl:  .  zinc sulfate 220 (50 Zn) MG capsule, Take 1 capsule (220 mg  total) by mouth daily. Please take for 2 weeks, Disp: , Rfl:  .  baclofen (LIORESAL) 10 MG tablet, Take 1 tablet (10 mg total) by mouth 3 (three) times daily., Disp: 90 each, Rfl: 3  PAST MEDICAL HISTORY: Past Medical History:  Diagnosis Date  . Hypertension   . Hypoglycemia   . Radiation 10/21/2015-10/23/2015   Anterior neck area 12 gray in 3 fractions  . Teratoma     PAST SURGICAL HISTORY: Past Surgical History:  Procedure Laterality Date  . ABDOMINAL HERNIA REPAIR    . ABDOMINAL HYSTERECTOMY    . CESAREAN SECTION    . CRYOTHERAPY  2013   to neck  . DERMOID CYST  EXCISION    . ECTOPIC PREGNANCY SURGERY    . EXCISION MASS  NECK Bilateral 10/20/2015   Procedure: EXCISION OF LARGE KELOIDS SEVERE RIGHT AND LEFT NECK WITH PLASTIC RECONSTRUCTION;  Surgeon: Cristine Polio, MD;  Location: [place];  Service: Plastics;  Laterality: Bilateral;  . HAND SURGERY     amputation of right index finger    FAMILY HISTORY: Family History  Problem Relation Age of Onset  . Hypertension Other     SOCIAL HISTORY:  Social History   Socioeconomic History  . Marital status: Married    Spouse name: Not on file  . Number of children: 2  . Years of education: Not on file  . Highest education level: Not on file  Occupational History  . Occupation: Therapist, music  Social Needs  . Financial resource strain: Not on file  . Food insecurity    Worry: Not on file    Inability: Not on file  . Transportation needs    Medical: Not on file    Non-medical: Not on file  Tobacco Use  . Smoking status: Never Smoker  . Smokeless tobacco: Never Used  Substance and Sexual Activity  . Alcohol use: Yes    Comment: socially  . Drug use: No  . Sexual activity: Yes    Birth control/protection: Surgical  Lifestyle  . Physical activity    Days per week: Not on file    Minutes per session: Not on file  . Stress: Not on file  Relationships  . Social Herbalist on phone: Not  on file    Gets together: Not on file    Attends religious service: Not on file    Active member of club or organization: Not on file    Attends meetings of clubs or organizations: Not on file    Relationship status: Not on file  . Intimate partner violence    Fear of current or ex partner: Not on file    Emotionally abused: Not on file    Physically abused: Not on file    Forced sexual activity: Not on file  Other Topics Concern  . Not on file  Social History Narrative  . Not on file     PHYSICAL EXAM PHYSICAL EXAM  Vitals:   03/13/19 0904  BP: 121/81  Pulse: 85  Temp: (!) 96.4 F (35.8 C)  SpO2: 97%  Weight: 258 lb 8 oz (117.3 kg)  Height: 6' (1.829 m)    Body mass index is 35.06 kg/m.   General: The patient is well-developed and well-nourished and in no acute distress.  Head is [place]/AT  Neck: The neck is supple, no carotid bruits are noted.  The neck is nontender.  Neurologic Exam  Mental status: The patient is alert and oriented x 3 at the time of the examination. The patient has apparent normal recent and remote memory, with an apparently normal attention span and concentration ability.   Speech is normal.  Cranial nerves: Extraocular movements are full. Pupils are equal, round, and reactive to light and accomodation.  Color vision is slightly reduced OD.  VA is slightly reduced OD (20/50 vs 20/40).  Facial symmetry is present. There is good facial sensation to soft touch bilaterally.Facial strength is normal.  Trapezius and sternocleidomastoid strength is normal. No dysarthria is noted.  The tongue is midline, and the patient has symmetric elevation of the soft palate. No obvious hearing deficits are noted.  Motor:  Muscle bulk is normal.   Tone is increased in legs, right > left.. Strength is  5 / 5 in all 4 extremities.   Sensory: Sensory testing is intact to pinprick, soft touch and vibration sensation in all 4 extremities.  Coordination: Cerebellar testing  reveals good finger-nose-finger and heel-to-shin bilaterally.  Gait and station: Station is normal.   Gait is mildly wide. Tandem gait is wide. Romberg is negative.   Reflexes: Deep tendon reflexes are symmetric and normal bilaterally.   Plantar responses are flexor.     DIAGNOSTIC DATA (LABS, IMAGING, TESTING) - I reviewed patient records, labs, notes, testing and imaging myself where available.  Lab Results  Component Value Date   WBC 14.2 (H) 01/20/2019   HGB 13.3 01/20/2019   HCT 40.3 01/20/2019   MCV 90.0 01/20/2019   PLT 307 01/20/2019      Component Value Date/Time   NA 132 (L) 01/20/2019 0450   K 4.2 01/20/2019 0450   CL 96 (L) 01/20/2019 0450   CO2 25 01/20/2019 0450   GLUCOSE 209 (H) 01/20/2019 0450   BUN 30 (H) 01/20/2019 0450   CREATININE 0.82 01/20/2019 0450   CALCIUM 9.4 01/20/2019 0450   PROT 7.5 01/20/2019 0450   ALBUMIN 3.8 01/20/2019 0450   AST 22 01/20/2019 0450   ALT 34 01/20/2019 0450   ALKPHOS 83 01/20/2019 0450   BILITOT 0.3 01/20/2019 0450   GFRNONAA >60 01/20/2019 0450   GFRAA >60 01/20/2019 0450   Lab Results  Component Value Date   CHOL 228 (H) 12/20/2017   HDL 40.10 12/20/2017   LDLCALC 154 (H) 12/20/2017   LDLDIRECT 177.3 07/06/2013   TRIG 80 01/17/2019   CHOLHDL 6 12/20/2017   Lab Results  Component Value Date   HGBA1C 6.5 12/20/2017    Lab Results  Component Value Date   TSH 1.27 12/20/2017         ASSESSMENT AND PLAN  1. Transverse myelitis ([place])   2. Right leg weakness   3. Other incomplete lesion at T10 level of thoracic spinal cord, initial encounter ([place])   4. Numbness   5. Pneumonia due to COVID-19 virus      1.  The etiology of the transverse myelitis is uncertain.  This could be idiopathic or related to her COVID-19 infection.  We will need to recheck an MRI of the brain in about 9 to 12 months to determine if there has been any subclinical progression.  If this has occurred, then she is more likely to have  multiple sclerosis will begin a disease modifying therapy 2.   continue gabapentin.   Change Flexeril to present for spasticity 3.   Continue physical therapy for the weakness and gait difficulties. 4.   Return in 6 months or call sooner if there are new or worsening neurologic symptoms.    Richard A. Felecia Shelling, MD, Gifford Shave 3/50/0938, 1:82 PM Certified in Neurology, Clinical Neurophysiology, Sleep Medicine and Neuroimaging  [facility] [address], [place], [place] [address] [phone]

## 2019-03-15 ENCOUNTER — Telehealth: Payer: Self-pay | Admitting: Neurology

## 2019-03-15 NOTE — Telephone Encounter (Signed)
Pt's disability forms have been faxed and a copy was mailed as pt had requested.

## 2019-03-16 LAB — NEUROMYELITIS OPTICA AUTOAB, IGG: NMO IgG Autoantibodies: <1.5 U/mL (ref 0.0–3.0)

## 2019-03-19 ENCOUNTER — Ambulatory Visit: Payer: BC Managed Care – PPO | Attending: Neurology | Admitting: Physical Therapy

## 2019-03-19 ENCOUNTER — Encounter: Payer: Self-pay | Admitting: Physical Therapy

## 2019-03-19 ENCOUNTER — Telehealth: Payer: Self-pay | Admitting: *Deleted

## 2019-03-19 ENCOUNTER — Other Ambulatory Visit: Payer: Self-pay

## 2019-03-19 DIAGNOSIS — R252 Cramp and spasm: Secondary | ICD-10-CM | POA: Insufficient documentation

## 2019-03-19 DIAGNOSIS — R29818 Other symptoms and signs involving the nervous system: Secondary | ICD-10-CM | POA: Diagnosis present

## 2019-03-19 DIAGNOSIS — M6281 Muscle weakness (generalized): Secondary | ICD-10-CM | POA: Insufficient documentation

## 2019-03-19 DIAGNOSIS — M5414 Radiculopathy, thoracic region: Secondary | ICD-10-CM | POA: Diagnosis present

## 2019-03-19 NOTE — Patient Instructions (Signed)
Access Code: [email or web address]  URL: [email or web address]  Date: 02/26/2019  Prepared by: Faustino Congress   Exercises  Hooklying Single Knee to Chest - 3 reps - 1 sets - 30 sec hold - 2x daily - 7x weekly  Supine Double Knee to Chest - 3 reps - 1 sets - 30 sec hold - 2x daily - 7x weekly  Supine Lower Trunk Rotation - 3 reps - 1 sets - 30 sec hold - 2x daily - 7x weekly  Standing Paraspinals Mobilization with Small Ball on Wall - 1-2 reps - 1 sets - 3-5 min hold - 2x daily - 7x weekly  Patient Education  Trigger Point Dry Needling

## 2019-03-19 NOTE — Telephone Encounter (Signed)
-----   Message from Britt Bottom, MD sent at 03/19/2019  7:51 AM EDT ----- Please let the patient know that the lab work is fine.

## 2019-03-19 NOTE — Therapy (Signed)
[facility] [address] [place], [place], [address] Phone: [phone]   Fax:  [phone]  Physical Therapy Treatment  Patient Details  Name: [name] MRN: [id number] Date of Birth: Nov 01, 1970 Referring Provider (PT): Felecia Shelling, Nanine Means, MD   Encounter Date: 03/19/2019   CLINIC OPERATION CHANGES: Outpatient Neuro Rehab is open at lower capacity following universal masking, social distancing, and patient screening.  The patient's COVID risk of complications score is 2.   PT End of Session - 03/19/19 1859    Visit Number  5    Number of Visits  16    Date for PT Re-Evaluation  04/23/19    Authorization Type  20 visit limit; deductible met    Authorization - Visit Number  4    Authorization - Number of Visits  20    PT Start Time  5176    PT Stop Time  1830    PT Time Calculation (min)  45 min    Activity Tolerance  Patient limited by pain    Behavior During Therapy  [facility] for tasks assessed/performed       Past Medical History:  Diagnosis Date  . Hypertension   . Hypoglycemia   . Radiation 10/21/2015-10/23/2015   Anterior neck area 12 gray in 3 fractions  . Teratoma     Past Surgical History:  Procedure Laterality Date  . ABDOMINAL HERNIA REPAIR    . ABDOMINAL HYSTERECTOMY    . CESAREAN SECTION    . CRYOTHERAPY  2013   to neck  . DERMOID CYST  EXCISION    . ECTOPIC PREGNANCY SURGERY    . EXCISION MASS NECK Bilateral 10/20/2015   Procedure: EXCISION OF LARGE KELOIDS SEVERE RIGHT AND LEFT NECK WITH PLASTIC RECONSTRUCTION;  Surgeon: Cristine Polio, MD;  Location: [place];  Service: Plastics;  Laterality: Bilateral;  . HAND SURGERY     amputation of right index finger    There were no vitals filed for this visit.  Subjective Assessment - 03/19/19 1749    Subjective  Still having some mm spasms and difficulty sleeping flat; still in recliner.  Husband has bought her a wedge but she  feels like it isn't high enough.  Asking about aquatic therapy.  Still having swelling in LE and numbness in R foot.    Limitations  Standing;Walking;Sitting    How long can you sit comfortably?  10-15 min    Patient Stated Goals  walk better, improve sensation, decrease pain    Currently in Pain?  Yes    Pain Onset  More than a month ago    Pain Onset  1 to 4 weeks ago                       [facility] Adult PT Treatment/Exercise - 03/19/19 1847      Ambulation/Gait   Ambulation/Gait  Yes    Ambulation/Gait Assistance  5: Supervision    Ambulation/Gait Assistance Details  pt continues to demonstrate shortened step length; when cued to increase step length and heel strike pt began to demonstrate compensatory antalgic gait with decreased stance time on R and knee flexion during R stance phase; continues to demonstrate decrease hip extension in terminal stance and decreased trunk rotation. Gait continues to be very guarded    Ambulation Distance (Feet)  230 Feet    Assistive device  None    Gait Pattern  Decreased arm swing - right;Decreased step  length - right;Decreased step length - left;Decreased stance time - right;Right flexed knee in stance;Antalgic;Decreased trunk rotation    Ambulation Surface  Level;Indoor      Self-Care   Self-Care  Other Self-Care Comments    Other Self-Care Comments   discussed aquatic therapy for Wednesday - discussed times, precautions at [facility] and provided pt with written instructions for arrival, address and phone number if she needs to contact PT.  Removed Thursday appointment due to pt's POC is 2x/week.  Also discussed where to purchase swiss ball for home.      Exercises   Exercises  Lumbar      Lumbar Exercises: Seated   Long Arc Quad on [place]  Strengthening;Right;Left;1 set;10 reps    Hip Flexion on Ball  Strengthening;Right;Left;10 reps    Other Seated Lumbar Exercises  Also seated on ball performed isolated anterior/posterior pelvic tilts x  10, lateral pelvic shifts x 10.  Added in UE movements with 2lb medicine ball raising ball overhead with anterior pelvic tilt and down during posterior pelvic tilt x 10 reps; ball raise again with anterior pelvic tilt and trunk rotation + pelvic shift to each side x 10 reps with min A for balance and sequencing             PT Education - 03/19/19 1858    Education Details  aquatic therapy on Wednesday, information to prepare    Person(s) Educated  Patient    Methods  Explanation;Handout    Comprehension  Verbalized understanding       PT Short Term Goals - 02/26/19 1129      PT SHORT TERM GOAL #1   Title  independent with initial HEP    Status  New    [place] Date  03/26/19      PT SHORT TERM GOAL #2   Title  report pain < 4/10 for improved activity tolerance    Status  New    Target Date  03/26/19      PT SHORT TERM GOAL #3   Title  report ability to ride in car for at least 30 min without increase in pain for improved tolerance    Status  New    Target Date  03/26/19      PT SHORT TERM GOAL #4   Title  balance to be assessed with formal LTGs to be written    Status  New    Target Date  03/26/19        PT Long Term Goals - 02/26/19 1130      PT LONG TERM GOAL #1   Title  independent with advanced HEP    Status  New    Target Date  04/23/19      PT LONG TERM GOAL #2   Title  gait velocity improved to >/= 1.8 ft/sec for decreased fall risk    Status  New    Target Date  04/23/19      PT LONG TERM GOAL #3   Title  amb> 500' with LRAD mod I without increase in pain for improved mobility    Status  New    Target Date  04/23/19      PT LONG TERM GOAL #4   Title  negotiate stairs modified independent for improved mobility    Status  New    Target Date  04/23/19            Plan - 03/19/19 1859    Clinical Impression Statement  Scheduled pt for aquatic therapy on Wednesday and provided pt with education to prepare for session.  Continued to provide pt  with information about how to acquire an 8" wedge for progressive return to sleeping in her bed and swiss ball for exercises at home.  Utilized swiss ball for core activation, spinal ROM and balance training.  Pt slightly limited by pain during exercises.  Continued to address gait without AD which resulted in greater compensatory gait pattern; pt would be willing to use treadmill for gait and endurance training.  Will perform at next land session.  Other land session this week cancelled due to aquatic therapy.    Personal Factors and Comorbidities  Comorbidity 2    Comorbidities  HTN, teratoma, incomplete lesion at T10    Examination-Activity Limitations  Sit;Sleep;Stand;Carry;Stairs;Locomotion Level    Examination-Participation Restrictions  Church;Community Activity;Other;Driving   work   [employer]  Evolving/Moderate complexity    Rehab Potential  Good    PT Frequency  2x / week    PT Duration  8 weeks    PT Treatment/Interventions  ADLs/Self Care Home Management;Aquatic Therapy;Cryotherapy;Electrical Stimulation;Moist Heat;Traction;Balance training;Therapeutic exercise;Therapeutic activities;Functional mobility training;Stair training;Gait training;Ultrasound;Neuromuscular re-education;Patient/family education;Manual techniques;Taping;Dry needling    PT Next Visit Plan  Aquatic therapy on WED.  Land appointment: CHECK STG.  try treadmill.  If she bought swiss ball- add swiss ball exercises to HEP.  Update HEP - add standing balance as well.  Pt has been sleeping in recliner since hospital due to pain when fully supine - may have to perform exercises reclined on wedge for improved tolerance and work towards supine.  Work on gait without AD - increasing weight shifting, step length, foot clearance.    PT Home Exercise Plan  Access Code: [email or web address]    Consulted and Agree with Plan of Care  Patient       Patient will benefit from skilled therapeutic intervention in order to  improve the following deficits and impairments:  Abnormal gait, Pain, Postural dysfunction, Impaired flexibility, Increased muscle spasms, Increased fascial restricitons, Decreased strength, Decreased balance, Decreased mobility, Difficulty walking, Decreased range of motion  Visit Diagnosis: 1. Other symptoms and signs involving the nervous system   2. Muscle weakness (generalized)   3. Radiculopathy, thoracic region   4. Cramp and spasm        Problem List Patient Active Problem List   Diagnosis Date Noted  . Transverse myelitis ([place]) 03/13/2019  . Numbness 02/13/2019  . Right leg weakness 02/13/2019  . Lower extremity weakness 01/16/2019  . Other incomplete lesion at T10 level of thoracic spinal cord, initial encounter ([place]) 01/16/2019  . Pneumonia due to COVID-19 virus 01/16/2019  . Notalgia 08/10/2017  . Morbid obesity ([place]) 02/18/2017  . OSA (obstructive sleep apnea) 01/24/2017  . 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 Snoring 10/22/2016  . Nocturnal dyspnea/choking   10/22/2016  . Keloid of skin 09/25/2015  . Essential hypertension, benign 09/27/2014  . Teratoma 08/07/2014  . Severe hypertension 06/08/2013  . HYPERLIPIDEMIA, MILD, WITH LOW HDL 07/23/2009  . SHORTNESS OF BREATH 02/11/2009  . HYPERTENSION 05/07/2008  . KELOID SCAR 05/07/2008  . HEADACHE 05/22/2007    Rico Junker, PT, DPT 03/19/19    7:06 PM    [facility] [address] [place], [place], [address] Phone: [phone]   Fax:  [phone]  Name: JUHI LAGRANGE MRN: [id number] Date of Birth: [birth year]

## 2019-03-21 ENCOUNTER — Other Ambulatory Visit: Payer: Self-pay

## 2019-03-21 ENCOUNTER — Ambulatory Visit: Payer: BC Managed Care – PPO | Admitting: Physical Therapy

## 2019-03-21 DIAGNOSIS — M6281 Muscle weakness (generalized): Secondary | ICD-10-CM

## 2019-03-21 DIAGNOSIS — R29818 Other symptoms and signs involving the nervous system: Secondary | ICD-10-CM

## 2019-03-21 NOTE — Therapy (Signed)
[facility] [address] [place], [place], [address] Phone: [phone]   Fax:  [phone]  Physical Therapy Treatment  Patient Details  Name: [name] MRN: [id number] Date of Birth: 1970/08/29 Referring Provider (PT): Britt Bottom, MD   Encounter Date: 03/21/2019  PT End of Session - 03/21/19 2050    Visit Number  6    Number of Visits  16    Date for PT Re-Evaluation  04/23/19    Authorization Type  20 visit limit; deductible met    Authorization - Visit Number  6    Authorization - Number of Visits  20    PT Start Time  1200    PT Stop Time  1250    PT Time Calculation (min)  50 min    Equipment Utilized During Treatment  Other (comment)   pool noodle   Activity Tolerance  Patient tolerated treatment well    Behavior During Therapy  [facility] for tasks assessed/performed       Past Medical History:  Diagnosis Date  . Hypertension   . Hypoglycemia   . Radiation 10/21/2015-10/23/2015   Anterior neck area 12 gray in 3 fractions  . Teratoma     Past Surgical History:  Procedure Laterality Date  . ABDOMINAL HERNIA REPAIR    . ABDOMINAL HYSTERECTOMY    . CESAREAN SECTION    . CRYOTHERAPY  2013   to neck  . DERMOID CYST  EXCISION    . ECTOPIC PREGNANCY SURGERY    . EXCISION MASS NECK Bilateral 10/20/2015   Procedure: EXCISION OF LARGE KELOIDS SEVERE RIGHT AND LEFT NECK WITH PLASTIC RECONSTRUCTION;  Surgeon: Cristine Polio, MD;  Location: [place];  Service: Plastics;  Laterality: Bilateral;  . HAND SURGERY     amputation of right index finger    There were no vitals filed for this visit.  Subjective Assessment - 03/21/19 2048    Subjective  Pt presents for aquatic therapy at [facility] - amb. with assistance of cane    Patient is accompained by:  Family member   husband   Limitations  Standing;Walking;Sitting    How long can you sit comfortably?  10-15 min    Patient Stated Goals  walk  better, improve sensation, decrease pain    Currently in Pain?  No/denies    Pain Onset  More than a month ago    Pain Onset  1 to 4 weeks ago       Aquatic therapy at [facility] - pool temp. 87.2 degrees  Patient seen for aquatic therapy today.  Treatment took place in water 3.5-4 feet deep depending upon activity.  Pt entered and exited the pool via ramp negotiation (amb. On ramp with holding onto rails for support and assist with balance).  Pt performed RLE stretches - Runner's stretch and heel cord stretch 30 sec hold x 1 rep each at beginning and end of session  Pt performed gait training in pool - 4' water depth - forwards 19mx 2 reps holding onto lane divider prn for assist with balance Pt performed backwards amb. 263m 1 rep holding onto pool noodle (stabilized by therapist) and sideways 25100m2 reps holding onto noodle for assist with balance  Pt performed RLE strengthening exercises - hip flexion, abduction, extension 10 reps each; hip abduction/adduction with Rt knee flexed at 90 degrees with RUE support on pool edge 10 reps; Pt then performed Lt hip flexion, extension and  abduction 10 reps for RLE strengthening isometrically and for improved RLE SLS - UE support needed for balance with these exercises As pt had some difficulty maintaining balance on RLE  Squats x 10 reps - bil. LE's Marching in place 15 reps - progressed to marching forwards/backwards across pool (65m with UE support on pool noodle  Pt performed Ai Chi posture (soothing) with RUE support due to inability to maintain balance in position (with knees flexed and LE's externally rotated) - 8 reps with this exercise due to difficulty   Pt performed pelvic rocking in standing - pt's back positioned against pool wall in 4' water depth - approx. 15 reps with AROM as tolerated; pt reported minimal pain with anterior pelvic tilt but moderate pain with posterior pelvic tilt  Pt requires buoyancy of water for off loading her  body and for spinal decompression for pain reduction;  Buoyancy of water allows freedom of movement with reduced pain than that experienced with exercise on land;  Buoyancy of water reduces weight of RLE and allows increased AROM and also increased spinal ROM with increased trunk movement  Viscosity of water needed for strengthening in that viscosity provides resistance in combination with support of trunk - pt performed exercises with the current in today's session to allow gradual progression of exercises and to progress to her pain tolerance                          PT Short Term Goals - 03/21/19 2106      PT SHORT TERM GOAL #1   Title  independent with initial HEP    Status  New    Target Date  03/26/19      PT SHORT TERM GOAL #2   Title  report pain < 4/10 for improved activity tolerance    Status  New    Target Date  03/26/19      PT SHORT TERM GOAL #3   Title  report ability to ride in car for at least 30 min without increase in pain for improved tolerance    Status  New    Target Date  03/26/19      PT SHORT TERM GOAL #4   Title  balance to be assessed with formal LTGs to be written    Status  New    Target Date  03/26/19        PT Long Term Goals - 03/21/19 2106      PT LONG TERM GOAL #1   Title  independent with advanced HEP    Status  New      PT LONG TERM GOAL #2   Title  gait velocity improved to >/= 1.8 ft/sec for decreased fall risk    Status  New      PT LONG TERM GOAL #3   Title  amb> 500' with LRAD mod I without increase in pain for improved mobility    Status  New      PT LONG TERM GOAL #4   Title  negotiate stairs modified independent for improved mobility    Status  New            Plan - 03/21/19 2055    Clinical Impression Statement  Pt tolerated aquatic exercises well with some c/o pain with Rt hip flexion in standing with big ROM on first rep - much improved with cues to perform with smaller AROM.  Pt also reported  pain  in back with posterior pelvic tilts in standing with back against wall - pt reported no numbness with anterior pelvic tilt.  Pt reports numbness in Rt foot which impacts balance and posture as pt has tendency to look down at Rt foot during gait - improved with cues to look up and also noted improvement with repetition as pt became more comfortable in the water.    Personal Factors and Comorbidities  Comorbidity 2    Comorbidities  HTN, teratoma, incomplete lesion at T10    Examination-Activity Limitations  Sit;Sleep;Stand;Carry;Stairs;Locomotion Level    Examination-Participation Restrictions  Church;Community Activity;Other;Driving   work   [employer]  Evolving/Moderate complexity    Rehab Potential  Good    PT Frequency  2x / week    PT Duration  8 weeks    PT Treatment/Interventions  ADLs/Self Care Home Management;Aquatic Therapy;Cryotherapy;Electrical Stimulation;Moist Heat;Traction;Balance training;Therapeutic exercise;Therapeutic activities;Functional mobility training;Stair training;Gait training;Ultrasound;Neuromuscular re-education;Patient/family education;Manual techniques;Taping;Dry needling    PT Next Visit Plan  Land appointment: CHECK STG.  try treadmill.  If she bought swiss ball- add swiss ball exercises to HEP.  Update HEP - add standing balance as well.  Pt has been sleeping in recliner since hospital due to pain when fully supine - may have to perform exercises reclined on wedge for improved tolerance and work towards supine.  Work on gait without AD - increasing weight shifting, step length, foot clearance.    PT Home Exercise Plan  Access Code: [email or web address]    Recommended Other Services  03-21-19 --   will continue with aquatic therapy 1x/week as schedule permits    Consulted and Agree with Plan of Care  Patient       Patient will benefit from skilled therapeutic intervention in order to improve the following deficits and impairments:  Abnormal gait,  Pain, Postural dysfunction, Impaired flexibility, Increased muscle spasms, Increased fascial restricitons, Decreased strength, Decreased balance, Decreased mobility, Difficulty walking, Decreased range of motion  Visit Diagnosis: 1. Muscle weakness (generalized)   2. Other symptoms and signs involving the nervous system        Problem List Patient Active Problem List   Diagnosis Date Noted  . Transverse myelitis ([place]) 03/13/2019  . Numbness 02/13/2019  . Right leg weakness 02/13/2019  . Lower extremity weakness 01/16/2019  . 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 Pneumonia due to COVID-19 virus 01/16/2019  . Notalgia 08/10/2017  . Morbid obesity ([place]) 02/18/2017  . OSA (obstructive sleep apnea) 01/24/2017  . 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 Snoring 10/22/2016  . Nocturnal dyspnea/choking   10/22/2016  . Keloid of skin 09/25/2015  . Essential hypertension, benign 09/27/2014  . Teratoma 08/07/2014  . Severe hypertension 06/08/2013  . HYPERLIPIDEMIA, MILD, WITH LOW HDL 07/23/2009  . SHORTNESS OF BREATH 02/11/2009  . HYPERTENSION 05/07/2008  . KELOID SCAR 05/07/2008  . HEADACHE 05/22/2007    [place], PT, ATRIC 03/21/2019, 9:08 PM  [facility] [address] [place], [place], [address] Phone: [phone]   Fax:  [phone]  Name: [name] MRN: [id number] Date of Birth: [birth year]

## 2019-03-22 ENCOUNTER — Ambulatory Visit: Payer: BC Managed Care – PPO | Admitting: Physical Therapy

## 2019-03-26 ENCOUNTER — Ambulatory Visit: Payer: BC Managed Care – PPO | Admitting: Physical Therapy

## 2019-03-26 ENCOUNTER — Telehealth: Payer: Self-pay | Admitting: Neurology

## 2019-03-26 ENCOUNTER — Encounter: Payer: Self-pay | Admitting: Neurology

## 2019-03-26 ENCOUNTER — Other Ambulatory Visit: Payer: Self-pay

## 2019-03-26 DIAGNOSIS — R29818 Other symptoms and signs involving the nervous system: Secondary | ICD-10-CM

## 2019-03-26 DIAGNOSIS — M5414 Radiculopathy, thoracic region: Secondary | ICD-10-CM

## 2019-03-26 DIAGNOSIS — M6281 Muscle weakness (generalized): Secondary | ICD-10-CM

## 2019-03-26 DIAGNOSIS — R252 Cramp and spasm: Secondary | ICD-10-CM

## 2019-03-26 NOTE — Telephone Encounter (Signed)
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 Recent paperwork you filled out for her work on 03/13/19: "no carrying, lifting, balancing, no driving.  poor ability to ride/drive to work and could work part-time from home. You stated should could return to work with restrictions: 03/19/19 and without restrictions: TBD.

## 2019-03-26 NOTE — Telephone Encounter (Signed)
I printed off a letter to be out of work until 04/23/2019

## 2019-03-26 NOTE — Telephone Encounter (Signed)
Pt states that she received a note for work that states that she needs to be on light duty but her work informed her that they rather have her stay home and recover. Pt is needing a new letter stating what date she can return to work. Please advise.

## 2019-03-26 NOTE — Therapy (Signed)
[facility] [address] [place], [place], [address] Phone: [phone]   Fax:  [phone]  Physical Therapy Treatment  Patient Details  Name: [name] MRN: [id number] Date of Birth: 24-Jan-1971 Referring Provider (PT): Britt Bottom, MD   Encounter Date: 03/26/2019  PT End of Session - 03/26/19 1859    Visit Number  7    Number of Visits  16    Date for PT Re-Evaluation  04/23/19    Authorization Type  20 visit limit; deductible met    Authorization - Visit Number  7    Authorization - Number of Visits  20    PT Start Time  1287    PT Stop Time  1830    PT Time Calculation (min)  45 min    Equipment Utilized During Treatment  Other (comment)   pool noodle   Activity Tolerance  Patient tolerated treatment well    Behavior During Therapy  [facility] for tasks assessed/performed       Past Medical History:  Diagnosis Date  . Hypertension   . Hypoglycemia   . Radiation 10/21/2015-10/23/2015   Anterior neck area 12 gray in 3 fractions  . Teratoma     Past Surgical History:  Procedure Laterality Date  . ABDOMINAL HERNIA REPAIR    . ABDOMINAL HYSTERECTOMY    . CESAREAN SECTION    . CRYOTHERAPY  2013   to neck  . DERMOID CYST  EXCISION    . ECTOPIC PREGNANCY SURGERY    . EXCISION MASS NECK Bilateral 10/20/2015   Procedure: EXCISION OF LARGE KELOIDS SEVERE RIGHT AND LEFT NECK WITH PLASTIC RECONSTRUCTION;  Surgeon: Cristine Polio, MD;  Location: [place];  Service: Plastics;  Laterality: Bilateral;  . HAND SURGERY     amputation of right index finger    There were no vitals filed for this visit.  Subjective Assessment - 03/26/19 1849    Subjective  Had two days pain free after aquatic but this past weekend it flared up again.  Drove herself to the appointment today so she feels a little stiff.  Is hoping to go back to work in September.    Patient is accompained by:  Family member    husband   Limitations  Standing;Walking;Sitting    How long can you sit comfortably?  10-15 min    Patient Stated Goals  walk better, improve sensation, decrease pain    Currently in Pain?  Yes    Pain Onset  More than a month ago    Pain Onset  1 to 4 weeks ago                       [facility] Adult PT Treatment/Exercise - 03/26/19 1849      Ambulation/Gait   Ambulation/Gait  Yes    Ambulation/Gait Assistance  5: Supervision    Ambulation/Gait Assistance Details  holding dowel in one hand and cane in the other with therapist holding other end of each behind patient facilitating increased UE swing, trunk rotation and increased step and stride length bilaterally.      Ambulation Distance (Feet)  230 Feet    Assistive device  None    Ambulation Surface  Level;Indoor      Balance Poses: Yoga   Warrior I  1 rep;60 seconds    Warrior I Limitations  with 3 reps each: trunk rotations to each side with UE horizontal ABD to same  side, R foot forwards and then L foot forwards.  Also performed 3 reps pressing off back toes for anterior weight shift x 3 reps with 4th rep performing step forward to meet contralateral LE      Exercises   Exercises  Lumbar;Knee/Hip      Lumbar Exercises: Stretches   Active Hamstring Stretch  Right;Left;3 reps;20 seconds    Active Hamstring Stretch Limitations  seated in chair, leaning over straight leg    Hip Flexor Stretch  Right;Left;3 reps;20 seconds    Hip Flexor Stretch Limitations  standing runner's stretch with UE support on table    Figure 4 Stretch  3 reps;20 seconds;With overpressure    Figure 4 Stretch Limitations  seated piriformis stretch alternating R and L    Gastroc Stretch  Right;Left;3 reps;20 seconds    Gastroc Stretch Limitations  standing runner's stretch with UE support on table      Knee/Hip Exercises: Standing   Forward Step Up  Right;Left;5 reps;Hand Hold: 1;Step Height: 4"    Forward Step Up Limitations  with  contralateral LE high knee march and then hip extension back to ground; alternating LE             PT Education - 03/26/19 1858    Education Details  set up remainder aquatic and land visits    Person(s) Educated  Patient    Methods  Explanation    Comprehension  Verbalized understanding       PT Short Term Goals - 03/26/19 1901      PT SHORT TERM GOAL #1   Title  independent with initial HEP    Status  Achieved    Target Date  03/26/19      PT SHORT TERM GOAL #2   Title  report pain < 4/10 for improved activity tolerance    Status  Partially Met    Target Date  03/26/19      PT SHORT TERM GOAL #3   Title  report ability to ride in car for at least 30 min without increase in pain for improved tolerance    Status  Achieved    Target Date  03/26/19      PT SHORT TERM GOAL #4   Title  balance to be assessed with formal LTGs to be written    Status  Deferred    Target Date  03/26/19        PT Long Term Goals - 03/21/19 2106      PT LONG TERM GOAL #1   Title  independent with advanced HEP    Status  New      PT LONG TERM GOAL #2   Title  gait velocity improved to >/= 1.8 ft/sec for decreased fall risk    Status  New      PT LONG TERM GOAL #3   Title  amb> 500' with LRAD mod I without increase in pain for improved mobility    Status  New      PT LONG TERM GOAL #4   Title  negotiate stairs modified independent for improved mobility    Status  New            Plan - 03/26/19 1859    Clinical Impression Statement  Pt is making good progress and has met 2/4 STG, partially met pain goal and balance assessment goal deferred.  Performed carryover of stretches performed at aquatic therapy to land and also added piriformis and hamstring stretches with pt  demonstrating overall decreased ROM on R side.  Continued to incorporate balance, LE strengthing and spinal rotation into standing exercises and focused gait training today on increasing UE swing to continue to  carryover increased trunk rotation and step length for more natural gait.  Will continue to progress towards LTG.    Personal Factors and Comorbidities  Comorbidity 2    Comorbidities  HTN, teratoma, incomplete lesion at T10    Examination-Activity Limitations  Sit;Sleep;Stand;Carry;Stairs;Locomotion Level    Examination-Participation Restrictions  Church;Community Activity;Other;Driving   work   [employer] officer  Evolving/Moderate complexity    Rehab Potential  Good    PT Frequency  2x / week    PT Duration  8 weeks    PT Treatment/Interventions  ADLs/Self Care Home Management;Aquatic Therapy;Cryotherapy;Electrical Stimulation;Moist Heat;Traction;Balance training;Therapeutic exercise;Therapeutic activities;Functional mobility training;Stair training;Gait training;Ultrasound;Neuromuscular re-education;Patient/family education;Manual techniques;Taping;Dry needling    PT Next Visit Plan  gait with increased arm swing.  try treadmill.  If she bought swiss ball- add swiss ball exercises to HEP.  Update HEP - add standing balance as well.  Pt has been sleeping in recliner since hospital due to pain when fully supine - may have to perform exercises reclined on wedge for improved tolerance and work towards supine.  Work on gait without AD - increasing weight shifting, step length, foot clearance.    PT Home Exercise Plan  Access Code: [email or web address]    Consulted and Agree with Plan of Care  Patient       Patient will benefit from skilled therapeutic intervention in order to improve the following deficits and impairments:  Abnormal gait, Pain, Postural dysfunction, Impaired flexibility, Increased muscle spasms, Increased fascial restricitons, Decreased strength, Decreased balance, Decreased mobility, Difficulty walking, Decreased range of motion  Visit Diagnosis: 1. Muscle weakness (generalized)   2. Other symptoms and signs involving the nervous system   3. Cramp and spasm   4.  Radiculopathy, thoracic region        Problem List Patient Active Problem List   Diagnosis Date Noted  . Transverse myelitis ([place]) 03/13/2019  . Numbness 02/13/2019  . Right leg weakness 02/13/2019  . Lower extremity weakness 01/16/2019  . 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 Pneumonia due to COVID-19 virus 01/16/2019  . Notalgia 08/10/2017  . Morbid obesity ([place]) 02/18/2017  . OSA (obstructive sleep apnea) 01/24/2017  . 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 Snoring 10/22/2016  . Nocturnal dyspnea/choking   10/22/2016  . Keloid of skin 09/25/2015  . Essential hypertension, benign 09/27/2014  . Teratoma 08/07/2014  . Severe hypertension 06/08/2013  . HYPERLIPIDEMIA, MILD, WITH LOW HDL 07/23/2009  . SHORTNESS OF BREATH 02/11/2009  . HYPERTENSION 05/07/2008  . KELOID SCAR 05/07/2008  . HEADACHE 05/22/2007    Rico Junker, PT, DPT 03/26/19    7:04 PM    [facility] [address] [place], [place], [address] Phone: [phone]   Fax:  [phone]  Name: [name] MRN: [id number] Date of Birth: 1971-05-04

## 2019-03-26 NOTE — Telephone Encounter (Signed)
I called pt. She wanted to know if she can go back to work, no restrictions on 04/23/19. I clarified and Dr. Felecia Shelling ok with this as long as she is comfortable. She also wanted to know when she can start driving. Advised per Dr. Felecia Shelling that she can drive with others on smaller roads a few times and if she does well, she can go back to driving independently. The can try this week. I faxed letter to [facility] at [phone]. Received fax confirmation.

## 2019-03-28 ENCOUNTER — Telehealth: Payer: BC Managed Care – PPO | Admitting: Family

## 2019-03-28 DIAGNOSIS — R399 Unspecified symptoms and signs involving the genitourinary system: Secondary | ICD-10-CM | POA: Diagnosis not present

## 2019-03-28 MED ORDER — CEPHALEXIN 500 MG PO CAPS: 500.0000 mg | ORAL_CAPSULE | Freq: Two times a day (BID) | ORAL | 0 refills | Status: DC

## 2019-03-28 NOTE — Progress Notes (Signed)

## 2019-03-29 ENCOUNTER — Other Ambulatory Visit: Payer: Self-pay

## 2019-03-29 ENCOUNTER — Ambulatory Visit: Payer: BC Managed Care – PPO | Admitting: Physical Therapy

## 2019-03-29 ENCOUNTER — Encounter: Payer: Self-pay | Admitting: Physical Therapy

## 2019-03-29 DIAGNOSIS — M6281 Muscle weakness (generalized): Secondary | ICD-10-CM

## 2019-03-29 DIAGNOSIS — R252 Cramp and spasm: Secondary | ICD-10-CM

## 2019-03-29 DIAGNOSIS — M5414 Radiculopathy, thoracic region: Secondary | ICD-10-CM

## 2019-03-29 DIAGNOSIS — R29818 Other symptoms and signs involving the nervous system: Secondary | ICD-10-CM | POA: Diagnosis not present

## 2019-03-29 NOTE — Therapy (Signed)
[facility] [address] [place], [place], [address] Phone: [phone]   Fax:  [phone]  Physical Therapy Treatment  Patient Details  Name: [name] MRN: [id number] Date of Birth: 1971/06/05 Referring Provider (PT): Britt Bottom, MD   Encounter Date: 03/29/2019  PT End of Session - 03/29/19 1651    Visit Number  8    Number of Visits  16    Date for PT Re-Evaluation  04/23/19    Authorization Type  20 visit limit; deductible met    Authorization - Visit Number  8    Authorization - Number of Visits  20    PT Start Time  [phone]    PT Stop Time  1018    PT Time Calculation (min)  42 min    Activity Tolerance  Patient tolerated treatment well    Behavior During Therapy  [facility] for tasks assessed/performed       Past Medical History:  Diagnosis Date  . Hypertension   . Hypoglycemia   . Radiation 10/21/2015-10/23/2015   Anterior neck area 12 gray in 3 fractions  . Teratoma     Past Surgical History:  Procedure Laterality Date  . ABDOMINAL HERNIA REPAIR    . ABDOMINAL HYSTERECTOMY    . CESAREAN SECTION    . CRYOTHERAPY  2013   to neck  . DERMOID CYST  EXCISION    . ECTOPIC PREGNANCY SURGERY    . EXCISION MASS NECK Bilateral 10/20/2015   Procedure: EXCISION OF LARGE KELOIDS SEVERE RIGHT AND LEFT NECK WITH PLASTIC RECONSTRUCTION;  Surgeon: Cristine Polio, MD;  Location: [facility];  Service: Plastics;  Laterality: Bilateral;  . HAND SURGERY     amputation of right index finger    There were no vitals filed for this visit.  Subjective Assessment - 03/29/19 0942    Subjective  Drove further distance yesterday and walked around [place] with mother in law.  When she got home she was very fatigued, had a headache and felt dizzy.  Went to sleep shortly after she got home.  Didn't drive today.    Patient is accompained by:  Family member   husband   Limitations  Standing;Walking;Sitting    How long can you sit comfortably?  10-15 min    Patient Stated Goals  walk better, improve sensation, decrease pain    Pain Onset  More than a month ago    Pain Onset  1 to 4 weeks ago             Vestibular Assessment - 03/29/19 0945      Symptom Behavior   Subjective history of current problem  Felt dizzy after shopping and driving yesterday; drove very slowly.  Driving in low light    Type of Dizziness   "Funny feeling in head";Blurred vision;Comment   and reports mild hallucinations   Frequency of Dizziness  intermittent    Duration of Dizziness  hours    Symptom Nature  Spontaneous    Aggravating Factors  Comment   after driving in low light   Relieving Factors  Rest    Progression of Symptoms  Better      Oculomotor Exam   Ocular ROM  impaired to R - reporting dizziness     Spontaneous  Absent    Gaze-induced   Absent    Smooth Pursuits  Intact   dizziness with pursuit to R   Saccades  Slow   dizziness  with vertical saccades   Comment  Convergence impaired      Oculomotor Exam-Fixation Suppressed    Left Head Impulse  unable to perform    Right Head Impulse  unable to perform      Vestibulo-Ocular Reflex   VOR to Slow Head Movement  Positive bilaterally    VOR Cancellation  Unable to maintain gaze    Comment  dizziness with slow VOR and VOR cancellation                Vestibular Treatment/Exercise - 03/29/19 1002      Vestibular Treatment/Exercise   Vestibular Treatment Provided  Habituation;Gaze    Habituation Exercises  Seated Horizontal Head Turns    Gaze Exercises  X1 Viewing Horizontal;X1 Viewing Vertical      Seated Horizontal Head Turns   Number of Reps   5    Symptom Description   to each side while performing VOR cancellation for visual motion sensitivity      X1 Viewing Horizontal   Foot Position  seated without back support    Reps  2    Comments  30 seconds; slow head movements      X1 Viewing Vertical   Foot Position  seated  without back support    Reps  2    Comments  only able to perform 17 seconds due to increased symptoms            PT Education - 03/29/19 1649    Education Details  vestibular impairments and visual motion sensitivity, habituation and gaze adaptation training    Person(s) Educated  Patient    Methods  Explanation;Handout;Demonstration    Comprehension  Verbalized understanding;Returned demonstration       PT Short Term Goals - 03/26/19 1901      PT SHORT TERM GOAL #1   Title  independent with initial HEP    Status  Achieved    Target Date  03/26/19      PT SHORT TERM GOAL #2   Title  report pain < 4/10 for improved activity tolerance    Status  Partially Met    Target Date  03/26/19      PT SHORT TERM GOAL #3   Title  report ability to ride in car for at least 30 min without increase in pain for improved tolerance    Status  Achieved    Target Date  03/26/19      PT SHORT TERM GOAL #4   Title  balance to be assessed with formal LTGs to be written    Status  Deferred    Target Date  03/26/19        PT Long Term Goals - 03/21/19 2106      PT LONG TERM GOAL #1   Title  independent with advanced HEP    Status  New      PT LONG TERM GOAL #2   Title  gait velocity improved to >/= 1.8 ft/sec for decreased fall risk    Status  New      PT LONG TERM GOAL #3   Title  amb> 500' with LRAD mod I without increase in pain for improved mobility    Status  New      PT LONG TERM GOAL #4   Title  negotiate stairs modified independent for improved mobility    Status  New            Plan - 03/29/19 1652  Clinical Impression Statement  Pt has begun to increase her activity level, walking distance and driving further distances which has resulted in symptoms of dizziness and disequilibrium.  Performed vestibular assessment today with pt demonstrating significant visual motion sensitivity.  Initiated habituation and gaze stabilization exercises in seated with very  limited tolerance to head turns to R and vertical head movements.  Pt to initiate at home and PT to continue to address and progress towards LTG.    Personal Factors and Comorbidities  Comorbidity 2    Comorbidities  HTN, teratoma, incomplete lesion at T10    Examination-Activity Limitations  Sit;Sleep;Stand;Carry;Stairs;Locomotion Level    Examination-Participation Restrictions  Church;Community Activity;Other;Driving   work   [employer]  Evolving/Moderate complexity    Rehab Potential  Good    PT Frequency  2x / week    PT Duration  8 weeks    PT Treatment/Interventions  ADLs/Self Care Home Management;Aquatic Therapy;Cryotherapy;Electrical Stimulation;Moist Heat;Traction;Balance training;Therapeutic exercise;Therapeutic activities;Functional mobility training;Stair training;Gait training;Ultrasound;Neuromuscular re-education;Patient/family education;Manual techniques;Taping;Dry needling    PT Next Visit Plan  Progress habituation and x1 viewing exercises; gait with increased arm swing.  try treadmill.  If she bought swiss ball- add swiss ball exercises to HEP.  Update HEP - add standing balance as well.  Pt has been sleeping in recliner since hospital due to pain when fully supine - may have to perform exercises reclined on wedge for improved tolerance and work towards supine.  Work on gait without AD - increasing weight shifting, step length, foot clearance.    PT Home Exercise Plan  Access Code: [email or web address]    Consulted and Agree with Plan of Care  Patient       Patient will benefit from skilled therapeutic intervention in order to improve the following deficits and impairments:  Abnormal gait, Pain, Postural dysfunction, Impaired flexibility, Increased muscle spasms, Increased fascial restricitons, Decreased strength, Decreased balance, Decreased mobility, Difficulty walking, Decreased range of motion  Visit Diagnosis: 1. Muscle weakness (generalized)   2. Other  symptoms and signs involving the nervous system   3. Cramp and spasm   4. Radiculopathy, thoracic region        Problem List Patient Active Problem List   Diagnosis Date Noted  . Transverse myelitis ([place]) 03/13/2019  . Numbness 02/13/2019  . Right leg weakness 02/13/2019  . Lower extremity weakness 01/16/2019  . 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 Pneumonia due to COVID-19 virus 01/16/2019  . Notalgia 08/10/2017  . Morbid obesity ([place]) 02/18/2017  . OSA (obstructive sleep apnea) 01/24/2017  . 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 Snoring 10/22/2016  . Nocturnal dyspnea/choking   10/22/2016  . Keloid of skin 09/25/2015  . Essential hypertension, benign 09/27/2014  . Teratoma 08/07/2014  . Severe hypertension 06/08/2013  . HYPERLIPIDEMIA, MILD, WITH LOW HDL 07/23/2009  . SHORTNESS OF BREATH 02/11/2009  . HYPERTENSION 05/07/2008  . KELOID SCAR 05/07/2008  . HEADACHE 05/22/2007    Rico Junker, PT, DPT 03/29/19    5:06 PM    [facility] [address] [place], [place], [address] Phone: [phone]   Fax:  [phone]  Name: [name] MRN: [id number] Date of Birth: 09-Apr-1971

## 2019-03-29 NOTE — Patient Instructions (Addendum)
Gaze Stabilization: Sitting    Keeping eyes on target on wall 3 feet away, and move head side to side for _30__ seconds. Repeat while moving head up and down for 10 repetitions Do __2-3__ sessions per day.  Copyright  VHI. All rights reserved.   Gaze Stabilization: Tip Card  1.Target must remain in focus, not blurry, and appear stationary while head is in motion. 2.Perform exercises with small head movements (45 to either side of midline). 3.Increase speed of head motion so long as target is in focus. 4.If you wear eyeglasses, be sure you can see target through lens (therapist will give specific instructions for bifocal / progressive lenses). 5.These exercises may provoke dizziness or nausea. Work through these symptoms. If too dizzy, slow head movement slightly. Rest between each exercise. 6.Exercises demand concentration; avoid distractions.       Visuo-Vestibular: Head / Eyes Moving in Same Direction    Holding a target, keep eyes on target and slowly move target, head, and eyes in SAME direction side to side for 5 repetitions slowly. Perform sitting. Repeat __2__ times per session. Do __2__ sessions per day.

## 2019-04-02 ENCOUNTER — Ambulatory Visit: Payer: BC Managed Care – PPO | Admitting: Physical Therapy

## 2019-04-03 ENCOUNTER — Encounter (HOSPITAL_COMMUNITY): Payer: Self-pay | Admitting: Emergency Medicine

## 2019-04-03 ENCOUNTER — Telehealth: Payer: Self-pay | Admitting: Neurology

## 2019-04-03 ENCOUNTER — Other Ambulatory Visit: Payer: Self-pay

## 2019-04-03 ENCOUNTER — Ambulatory Visit (HOSPITAL_COMMUNITY)
Admission: EM | Admit: 2019-04-03 | Discharge: 2019-04-03 | Disposition: A | Discharge status: Home / Self Care | Payer: BC Managed Care – PPO | Attending: Family Medicine | Admitting: Family Medicine

## 2019-04-03 DIAGNOSIS — E119 Type 2 diabetes mellitus without complications: Secondary | ICD-10-CM | POA: Diagnosis not present

## 2019-04-03 DIAGNOSIS — B3731 Acute candidiasis of vulva and vagina: Secondary | ICD-10-CM

## 2019-04-03 DIAGNOSIS — B373 Candidiasis of vulva and vagina: Secondary | ICD-10-CM

## 2019-04-03 LAB — POCT URINALYSIS DIP (DEVICE)
Bilirubin Urine: NEGATIVE
Glucose, UA: 500 mg/dL — AB
Hgb urine dipstick: NEGATIVE
Ketones, ur: NEGATIVE mg/dL
Leukocytes,Ua: NEGATIVE
Nitrite: NEGATIVE
Protein, ur: NEGATIVE mg/dL
Specific Gravity, Urine: 1.015 (ref 1.005–1.030)
Urobilinogen, UA: 0.2 mg/dL (ref 0.0–1.0)
pH: 5.5 (ref 5.0–8.0)

## 2019-04-03 LAB — GLUCOSE, CAPILLARY: Glucose-Capillary: 497 mg/dL — ABNORMAL HIGH (ref 70–99)

## 2019-04-03 MED ORDER — FLUCONAZOLE 150 MG PO TABS: 150.0000 mg | ORAL_TABLET | Freq: Every day | ORAL | 0 refills | Status: DC

## 2019-04-03 MED ORDER — METFORMIN HCL 500 MG PO TABS: 500.0000 mg | ORAL_TABLET | Freq: Two times a day (BID) | ORAL | 0 refills | Status: DC

## 2019-04-03 NOTE — ED Provider Notes (Signed)
[facility]    CSN: [id number] Arrival date & time: 04/03/19  1246     History   Chief Complaint Chief Complaint  Patient presents with  . Dysuria  . Vaginal Discharge    HPI [name] is a 48 y.o. female.   HPI Patient is here for possible bladder infection.  She had an ED visit on 03/28/2019 for urinary frequency and dysuria.  Based on her symptoms she was started on Keflex.  She has been taking it for 6 days and feels no better.  She states that she feels like her outer vaginal area is "raw".  Still urinary frequently.  Still with dysuria.  No abdominal pain.  No fever chills.  No flank pain.  No nausea vomiting or diarrhea.  No other recent illness.  Patient did have a hysterectomy many years ago, bilateral oophorectomy Patient developed transverse myelitis.  Unusual neurologic symptoms.  She tested positive for COVID.  She is still recovering and getting regular physical therapy for this. Past Medical History:  Diagnosis Date  . Hypertension   . Hypoglycemia   . Radiation 10/21/2015-10/23/2015   Anterior neck area 12 gray in 3 fractions  . Teratoma     Patient Active Problem List   Diagnosis Date Noted  . Transverse myelitis ([place]) 03/13/2019  . Numbness 02/13/2019  . Right leg weakness 02/13/2019  . Lower extremity weakness 01/16/2019  . Other incomplete lesion at T10 level of thoracic spinal cord, initial encounter ([place]) 01/16/2019  . Pneumonia due to COVID-19 virus 01/16/2019  . Notalgia 08/10/2017  . Morbid obesity ([place]) 02/18/2017  . OSA (obstructive sleep apnea) 01/24/2017  . 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 Snoring 10/22/2016  . Nocturnal dyspnea/choking   10/22/2016  . Keloid of skin 09/25/2015  . Essential hypertension, benign 09/27/2014  . Teratoma 08/07/2014  . Severe hypertension 06/08/2013  . HYPERLIPIDEMIA, MILD, WITH LOW HDL 07/23/2009  . SHORTNESS OF BREATH 02/11/2009  . HYPERTENSION 05/07/2008  . KELOID SCAR  05/07/2008  . HEADACHE 05/22/2007    Past Surgical History:  Procedure Laterality Date  . ABDOMINAL HERNIA REPAIR    . ABDOMINAL HYSTERECTOMY    . CESAREAN SECTION    . CRYOTHERAPY  2013   to neck  . DERMOID CYST  EXCISION    . ECTOPIC PREGNANCY SURGERY    . EXCISION MASS NECK Bilateral 10/20/2015   Procedure: EXCISION OF LARGE KELOIDS SEVERE RIGHT AND LEFT NECK WITH PLASTIC RECONSTRUCTION;  Surgeon: Cristine Polio, MD;  Location: [facility];  Service: Plastics;  Laterality: Bilateral;  . HAND SURGERY     amputation of right index finger    OB History   No obstetric history on file.      Home Medications    Prior to Admission medications   Medication Sig Start Date End Date Taking? Authorizing Provider  baclofen (LIORESAL) 10 MG tablet Take 1 tablet (10 mg total) by mouth 3 (three) times daily. 03/13/19   Sater, Nanine Means, MD  cephALEXin (KEFLEX) 500 MG capsule Take 1 capsule (500 mg total) by mouth 2 (two) times daily. 03/28/19   Sharion Balloon, FNP  cyclobenzaprine (FLEXERIL) 5 MG tablet Take 1 tablet (5 mg total) by mouth 3 (three) times daily as needed for muscle spasms. 02/13/19   Sater, Nanine Means, MD  fluconazole (DIFLUCAN) 150 MG tablet Take 1 tablet (150 mg total) by mouth daily. Repeat in 1 week if needed 04/03/19   Raylene Everts,  MD  gabapentin (NEURONTIN) 300 MG capsule Take 1 capsule (300 mg total) by mouth 3 (three) times daily. 02/13/19   Sater, Nanine Means, MD  HYDROcodone-acetaminophen (NORCO/VICODIN) 5-325 MG tablet Take 1 tablet by mouth every 6 (six) hours as needed for severe pain. 02/01/19   Panosh, Standley Brooking, MD  metFORMIN (GLUCOPHAGE) 500 MG tablet Take 1 tablet (500 mg total) by mouth 2 (two) times daily with a meal. 04/03/19   Raylene Everts, MD  Olmesartan-amLODIPine-HCTZ 40-5-25 MG TABS Take 1 tablet by mouth daily. 01/25/19   Panosh, Standley Brooking, MD  vitamin C (VITAMIN C) 500 MG tablet Take 1 tablet (500 mg total) by mouth daily. Please take  for 2 weeks 01/21/19   Elgergawy, Silver Huguenin, MD  zinc sulfate 220 (50 Zn) MG capsule Take 1 capsule (220 mg total) by mouth daily. Please take for 2 weeks 01/21/19   Elgergawy, Silver Huguenin, MD    Family History Family History  Problem Relation Age of Onset  . Hypertension Other     Social History Social History   Tobacco Use  . Smoking status: Never Smoker  . Smokeless tobacco: Never Used  Substance Use Topics  . Alcohol use: Yes    Comment: socially  . Drug use: No     Allergies   Tylox [oxycodone-acetaminophen]   Review of Systems Review of Systems  Constitutional: Negative for chills, fatigue and fever.  HENT: Negative for ear pain and sore throat.   Eyes: Negative for pain and visual disturbance.  Respiratory: Negative for cough and shortness of breath.   Cardiovascular: Negative for chest pain and palpitations.  Gastrointestinal: Negative for abdominal pain and vomiting.  Genitourinary: Positive for dysuria, frequency, vaginal discharge and vaginal pain. Negative for menstrual problem.  Musculoskeletal: Negative for arthralgias and back pain.  Skin: Negative for color change and rash.  Neurological: Negative for seizures and syncope.  All other systems reviewed and are negative.    Physical Exam Triage Vital Signs ED Triage Vitals  Enc Vitals Group     BP 04/03/19 1321 (!) 160/93     Pulse Rate 04/03/19 1321 87     Resp 04/03/19 1321 18     Temp 04/03/19 1321 98.8 F (37.1 C)     Temp Source 04/03/19 1321 Oral     SpO2 04/03/19 1321 99 %     Weight --      Height --      Head Circumference --      Peak Flow --      Pain Score 04/03/19 1322 5     Pain Loc --      Pain Edu? --      Excl. in [place]? --    No data found.  Updated Vital Signs BP (!) 160/93 (BP Location: Right Arm)   Pulse 87   Temp 98.8 F (37.1 C) (Oral)   Resp 18   SpO2 99%     Physical Exam Constitutional:      General: She is not in acute distress.    Appearance: She is  well-developed. She is obese.  HENT:     Head: Normocephalic and atraumatic.  Eyes:     Conjunctiva/sclera: Conjunctivae normal.     Pupils: Pupils are equal, round, and reactive to light.  Neck:     Musculoskeletal: Normal range of motion.  Cardiovascular:     Rate and Rhythm: Normal rate.  Pulmonary:     Effort: Pulmonary effort is normal. No respiratory  distress.  Abdominal:     General: There is no distension.     Palpations: Abdomen is soft.  Genitourinary:    Comments: Thick yellow to white vaginal discharge adherent to the walls.  The introitus is raw and erythematous Musculoskeletal: Normal range of motion.  Skin:    General: Skin is warm and dry.  Neurological:     Mental Status: She is alert.      UC Treatments / Results  Labs (all labs ordered are listed, but only abnormal results are displayed) Labs Reviewed  GLUCOSE, CAPILLARY - Abnormal; Notable for the following components:      Result Value   Glucose-Capillary 497 (*)    All other components within normal limits  POCT URINALYSIS DIP (DEVICE) - Abnormal; Notable for the following components:   Glucose, UA 500 (*)    All other components within normal limits  CBG MONITORING, ED    EKG   Radiology No results found.  Procedures Procedures (including critical care time)  Medications Ordered in UC Medications - No data to display  Initial Impression / Assessment and Plan / UC Course  I have reviewed the triage vital signs and the nursing notes.  Pertinent labs & imaging results that were available during my care of the patient were reviewed by me and considered in my medical decision making (see chart for details).      Final Clinical Impressions(s) / UC Diagnoses   Final diagnoses:  New onset type 2 diabetes mellitus ([place])  Vulvovaginitis due to yeast     Discharge Instructions     Take metformin 500 mg 2 times a day with food Call your doctor today to set up an appointment as soon as  possible for discussion of your new onset diabetes Avoid concentrated sweets.  No soda, sweet tea, jelly syrups cookies cakes desserts Limit carbohydrates. Unlimited lean meats and vegetables.  Moderate fruit. Read the diabetes information Walk every day that you are able    ED Prescriptions    Medication Sig Dispense Auth. Provider   fluconazole (DIFLUCAN) 150 MG tablet Take 1 tablet (150 mg total) by mouth daily. Repeat in 1 week if needed 2 tablet Raylene Everts, MD   metFORMIN (GLUCOPHAGE) 500 MG tablet Take 1 tablet (500 mg total) by mouth 2 (two) times daily with a meal. 60 tablet Raylene Everts, MD     Controlled Substance Prescriptions [place] Controlled Substance Registry consulted? Not Applicable   Raylene Everts, MD 04/03/19 2035

## 2019-04-03 NOTE — Discharge Instructions (Signed)
Take metformin 500 mg 2 times a day with food Call your doctor today to set up an appointment as soon as possible for discussion of your new onset diabetes Avoid concentrated sweets.  No soda, sweet tea, jelly syrups cookies cakes desserts Limit carbohydrates. Unlimited lean meats and vegetables.  Moderate fruit. Read the diabetes information Walk every day that you are able

## 2019-04-03 NOTE — ED Triage Notes (Signed)
Pt here for dysuria and vaginal discharge; pt sts taking antibiotics from PCP without relief

## 2019-04-03 NOTE — Telephone Encounter (Signed)
Completed form, waiting on MD signature

## 2019-04-03 NOTE — Telephone Encounter (Signed)
Called pt. Form completed and ready for pick up. She will pick up before 5pm today. Placed up front for pick up.

## 2019-04-03 NOTE — Telephone Encounter (Signed)
Pt called in wanting to know if she can be granted a temp handicapp sticker

## 2019-04-03 NOTE — Telephone Encounter (Signed)
That is fine.   She has weakness from the transverse myelitis

## 2019-04-03 NOTE — Telephone Encounter (Signed)
Dr. Sater- are you ok with this? °

## 2019-04-04 ENCOUNTER — Other Ambulatory Visit: Payer: Self-pay

## 2019-04-04 ENCOUNTER — Ambulatory Visit: Payer: BC Managed Care – PPO | Admitting: Physical Therapy

## 2019-04-04 DIAGNOSIS — Z20822 Contact with and (suspected) exposure to covid-19: Secondary | ICD-10-CM

## 2019-04-04 DIAGNOSIS — R29818 Other symptoms and signs involving the nervous system: Secondary | ICD-10-CM | POA: Diagnosis not present

## 2019-04-04 DIAGNOSIS — M6281 Muscle weakness (generalized): Secondary | ICD-10-CM

## 2019-04-05 ENCOUNTER — Ambulatory Visit: Payer: BC Managed Care – PPO | Admitting: Physical Therapy

## 2019-04-05 NOTE — Therapy (Signed)
[facility] [address] [place], [place], [address] Phone: [phone]   Fax:  [phone]  Physical Therapy Treatment  Patient Details  Name: [name] MRN: [id number] Date of Birth: 1970/12/28 Referring Provider (PT): Britt Bottom, MD   Encounter Date: 04/04/2019  PT End of Session - 04/05/19 1059    Visit Number  9    Number of Visits  16    Date for PT Re-Evaluation  04/23/19    Authorization Type  20 visit limit; deductible met    Authorization - Visit Number  9    Authorization - Number of Visits  20    PT Start Time  1330    PT Stop Time  1415    PT Time Calculation (min)  45 min    Equipment Utilized During Treatment  --   aquatic ankle cuffs   Activity Tolerance  Patient tolerated treatment well    Behavior During Therapy  [facility] for tasks assessed/performed       Past Medical History:  Diagnosis Date  . Hypertension   . Hypoglycemia   . Radiation 10/21/2015-10/23/2015   Anterior neck area 12 gray in 3 fractions  . Teratoma     Past Surgical History:  Procedure Laterality Date  . ABDOMINAL HERNIA REPAIR    . ABDOMINAL HYSTERECTOMY    . CESAREAN SECTION    . CRYOTHERAPY  2013   to neck  . DERMOID CYST  EXCISION    . ECTOPIC PREGNANCY SURGERY    . EXCISION MASS NECK Bilateral 10/20/2015   Procedure: EXCISION OF LARGE KELOIDS SEVERE RIGHT AND LEFT NECK WITH PLASTIC RECONSTRUCTION;  Surgeon: Cristine Polio, MD;  Location: [place];  Service: Plastics;  Laterality: Bilateral;  . HAND SURGERY     amputation of right index finger    There were no vitals filed for this visit.  Subjective Assessment - 04/05/19 1054    Subjective  Pt [facility] to [facility] for aquatic therapy - states she walked earlier in week without using her cane (just carried it for safety);  states she was sick on Tues. due to her blood sugar being elevated - is feeling better today with metformin prescribed    Patient is accompained by:  Family member   husband   Limitations  Standing;Walking;Sitting    How long can you sit comfortably?  10-15 min    Patient Stated Goals  walk better, improve sensation, decrease pain    Currently in Pain?  Yes    Pain Score  2     Pain Location  Back    Pain Orientation  Right;Left    Pain Descriptors / Indicators  Discomfort;Tightness;Aching    Pain Type  Chronic pain    Pain Onset  More than a month ago    Pain Frequency  Intermittent    Pain Onset  1 to 4 weeks ago         Aquatic therapy at [facility] - pool temp. 87.0  degrees  Patient seen for aquatic therapy today.  Treatment took place in water 3.5-4 feet deep depending upon activity.  Pt entered and exited the pool via ramp negotiation (amb. On ramp with holding onto rails for support and assist with balance).  Pt performed RLE stretches - Runner's stretch and heel cord stretch 30 sec hold x 1 rep each at beginning and end of session  Pt performed gait training in pool - 4' water depth - forwards  76mx 4 reps with no UE support  Pt performed backwards amb. 283m 1 rep holding onto pool noodle  and sideways 251m2 reps with small squats without UE support   Pt performed RLE strengthening exercises - hip flexion, abduction, extension 10 reps each; hip abduction/adduction with Rt knee flexed at 90 degrees with UE support on pool edge with use of buoyant ankle cuff on each leg 10 reps each direction Squats x 10 reps - bil. LE's:  RLE unilateral squats 10 reps with UE support on pool edge Marching in place 15 reps and then marching forwards across pool 18m28mt performed pelvic rocking in standing - pt's back positioned against pool wall in 4' water depth - approx 10 reps-less pain reported with this exercise today  Pt requires buoyancy of water for off loading her body and for spinal decompression for pain reduction;  Buoyancy of water allows freedom of movement with reduced pain than that experienced with  exercise on land;  Buoyancy of water reduces weight of RLE and allows increased AROM and also increased spinal ROM with increased trunk movement  Viscosity of water needed for strengthening in that viscosity provides resistance in combination with support of trunk - pt performed exercises with the current in today's session to allow gradual progression of exercises and to progress to her pain tolerance                            PT Short Term Goals - 04/05/19 1605      PT SHORT TERM GOAL #1   Title  independent with initial HEP    Status  Achieved    Target Date  03/26/19      PT SHORT TERM GOAL #2   Title  report pain < 4/10 for improved activity tolerance    Status  Partially Met    Target Date  03/26/19      PT SHORT TERM GOAL #3   Title  report ability to ride in car for at least 30 min without increase in pain for improved tolerance    Status  Achieved    Target Date  03/26/19      PT SHORT TERM GOAL #4   Title  balance to be assessed with formal LTGs to be written    Status  Deferred    Target Date  03/26/19        PT Long Term Goals - 04/05/19 1605      PT LONG TERM GOAL #1   Title  independent with advanced HEP    Status  New      PT LONG TERM GOAL #2   Title  gait velocity improved to >/= 1.8 ft/sec for decreased fall risk    Status  New      PT LONG TERM GOAL #3   Title  amb> 500' with LRAD mod I without increase in pain for improved mobility    Status  New      PT LONG TERM GOAL #4   Title  negotiate stairs modified independent for improved mobility    Status  New            Plan - 04/05/19 1600    Clinical Impression Statement  Pt much improved with ambulation in water and with performance of standing hip strengthening exercises with buoyant ankle cuffs used today for control with isomentric exs. and incr. resistance with  the eccentric exercise.  Pt reported mild dizziness with standing balance exercises iwth head turns in  water, but quickly subsided with rest break.  Pt reported fatigue at end of session.    Comorbidities  HTN, teratoma, incomplete lesion at T10    PT Frequency  2x / week    PT Duration  8 weeks    PT Treatment/Interventions  ADLs/Self Care Home Management;Aquatic Therapy;Cryotherapy;Electrical Stimulation;Moist Heat;Traction;Balance training;Therapeutic exercise;Therapeutic activities;Functional mobility training;Stair training;Gait training;Ultrasound;Neuromuscular re-education;Patient/family education;Manual techniques;Taping;Dry needling    PT Next Visit Plan  Progress habituation and x1 viewing exercises; gait with increased arm swing.  try treadmill.  If she bought swiss ball- add swiss ball exercises to HEP.  Update HEP - add standing balance as well.  Pt has been sleeping in recliner since hospital due to pain when fully supine - may have to perform exercises reclined on wedge for improved tolerance and work towards supine.  Work on gait without AD - increasing weight shifting, step length, foot clearance.    PT Home Exercise Plan  Access Code: [email or web address]    Consulted and Agree with Plan of Care  Patient       Patient will benefit from skilled therapeutic intervention in order to improve the following deficits and impairments:     Visit Diagnosis: Muscle weakness (generalized)  Other symptoms and signs involving the nervous system     Problem List Patient Active Problem List   Diagnosis Date Noted  . Transverse myelitis ([place]) 03/13/2019  . Numbness 02/13/2019  . Right leg weakness 02/13/2019  . Lower extremity weakness 01/16/2019  . 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 Pneumonia due to COVID-19 virus 01/16/2019  . Notalgia 08/10/2017  . Morbid obesity ([place]) 02/18/2017  . OSA (obstructive sleep apnea) 01/24/2017  . 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 Snoring 10/22/2016  . Nocturnal dyspnea/choking    10/22/2016  . Keloid of skin 09/25/2015  . Essential hypertension, benign 09/27/2014  . Teratoma 08/07/2014  . Severe hypertension 06/08/2013  . HYPERLIPIDEMIA, MILD, WITH LOW HDL 07/23/2009  . SHORTNESS OF BREATH 02/11/2009  . HYPERTENSION 05/07/2008  . KELOID SCAR 05/07/2008  . HEADACHE 05/22/2007    [place], PT, ATRIC  04/05/2019, 4:08 PM  [facility] [address] [place], [place], [address] Phone: [phone]   Fax:  [phone]  Name: [name] MRN: [id number] Date of Birth: 27-Nov-1970

## 2019-04-06 ENCOUNTER — Other Ambulatory Visit: Payer: Self-pay

## 2019-04-06 ENCOUNTER — Ambulatory Visit: Payer: BC Managed Care – PPO | Admitting: Physical Therapy

## 2019-04-06 ENCOUNTER — Encounter: Payer: Self-pay | Admitting: Physical Therapy

## 2019-04-06 DIAGNOSIS — M6281 Muscle weakness (generalized): Secondary | ICD-10-CM

## 2019-04-06 DIAGNOSIS — M5414 Radiculopathy, thoracic region: Secondary | ICD-10-CM

## 2019-04-06 DIAGNOSIS — R252 Cramp and spasm: Secondary | ICD-10-CM

## 2019-04-06 DIAGNOSIS — R29818 Other symptoms and signs involving the nervous system: Secondary | ICD-10-CM

## 2019-04-06 LAB — NOVEL CORONAVIRUS, NAA: SARS-CoV-2, NAA: NOT DETECTED

## 2019-04-06 NOTE — Therapy (Signed)
[facility] [address] [place], [place], [address] Phone: [phone]   Fax:  [phone]  Physical Therapy Treatment  Patient Details  Name: [name] MRN: [id number] Date of Birth: 13-May-1971 Referring Provider (PT): Britt Bottom, MD   Encounter Date: 04/06/2019  PT End of Session - 04/06/19 1335    Visit Number  10    Number of Visits  16    Date for PT Re-Evaluation  04/23/19    Authorization Type  20 visit limit; deductible met    Authorization - Visit Number  10    Authorization - Number of Visits  20    PT Start Time  [phone]    PT Stop Time  0935    PT Time Calculation (min)  41 min    Equipment Utilized During Treatment  --    Activity Tolerance  Patient tolerated treatment well    Behavior During Therapy  [facility] for tasks assessed/performed       Past Medical History:  Diagnosis Date  . Hypertension   . Hypoglycemia   . Radiation 10/21/2015-10/23/2015   Anterior neck area 12 gray in 3 fractions  . Teratoma     Past Surgical History:  Procedure Laterality Date  . ABDOMINAL HERNIA REPAIR    . ABDOMINAL HYSTERECTOMY    . CESAREAN SECTION    . CRYOTHERAPY  2013   to neck  . DERMOID CYST  EXCISION    . ECTOPIC PREGNANCY SURGERY    . EXCISION MASS NECK Bilateral 10/20/2015   Procedure: EXCISION OF LARGE KELOIDS SEVERE RIGHT AND LEFT NECK WITH PLASTIC RECONSTRUCTION;  Surgeon: Cristine Polio, MD;  Location: [facility];  Service: Plastics;  Laterality: Bilateral;  . HAND SURGERY     amputation of right index finger    There were no vitals filed for this visit.  Subjective Assessment - 04/06/19 0857    Subjective  Feeling better today; when she went to the urgent care they found that she had hyperglycemia; started her on Metformin.  Still being treated for UTI.  Aquatic is going well.  Wearing regular tennis shoes for the first time today and did her exercise ball yesterday.    Patient is accompained by:  Family member   husband   Limitations  Standing;Walking;Sitting    How long can you sit comfortably?  10-15 min    Patient Stated Goals  walk better, improve sensation, decrease pain    Currently in Pain?  Yes    Pain Onset  More than a month ago    Pain Onset  1 to 4 weeks ago                       [facility] Adult PT Treatment/Exercise - 04/06/19 0914      Lumbar Exercises: Stretches   Figure 4 Stretch  1 rep;20 seconds;Seated;With overpressure;Without overpressure    Figure 4 Stretch Limitations  Pt has been trying to perform this stretch at home seated on the ball.  Advised pt to perform on a stable, solid surface for safety and more efficienct stretch.  Reviewd stretch with pt applying overpressure to LLE, no overpressure to RLE      Lumbar Exercises: Aerobic   Tread Mill  0.5 - 0.7 mph with bilat UE support forwards x 6 minutes with focus on increasing R stance time, bilat step length and heel strike      Lumbar Exercises: Seated  [employer] on [place]  Strengthening;Right;Left;1 set;10 reps    LAQ on [place] Limitations  one UE support for balance; performing ankle DF with LAQ    Hip Flexion on Ball  Strengthening;Right;Left;10 reps    Hip Flexion on Ball Limitations  with one UE support for balance    Other Seated Lumbar Exercises  Seated on ball performed 10 reps bilat heel raises for gastroc strengthening      Knee/Hip Exercises: Standing   Heel Raises  Right;Left;2 sets;10 reps    Heel Raises Limitations  Performed with feet in long staggered stance focusing on gastroc activation and heel raise to shift weight forwards on front LE x 10 reps each side      Vestibular Treatment/Exercise - 04/06/19 0935      Vestibular Treatment/Exercise   Vestibular Treatment Provided  Gaze    Gaze Exercises  X1 Viewing Horizontal;X1 Viewing Vertical      X1 Viewing Horizontal   Foot Position  seated on physioball    Comments  30 seconds      X1  Viewing Vertical   Foot Position  seated on physioball    Comments  30 seconds            PT Education - 04/06/19 1334    Education Details  updated HEP and provided handout for clarification    Person(s) Educated  Patient    Methods  Explanation;Demonstration;Handout    Comprehension  Verbalized understanding;Returned demonstration      Gaze Stabilization: Sitting    Keeping eyes on target on wall 3 feet away, and move head side to side for _30__ seconds. Repeat while moving head up and down for 10 repetitions Do __2-3__ sessions per day.  Copyright  VHI. All rights reserved.   Gaze Stabilization: Tip Card  1.Target must remain in focus, not blurry, and appear stationary while head is in motion. 2.Perform exercises with small head movements (45 to either side of midline). 3.Increase speed of head motion so long as target is in focus. 4.If you wear eyeglasses, be sure you can see target through lens (therapist will give specific instructions for bifocal / progressive lenses). 5.These exercises may provoke dizziness or nausea. Work through these symptoms. If too dizzy, slow head movement slightly. Rest between each exercise. 6.Exercises demand concentration; avoid distractions.       Visuo-Vestibular: Head / Eyes Moving in Same Direction    Holding a target, keep eyes on target and slowly move target, head, and eyes in SAME direction side to side for 5 repetitions slowly. Perform sitting. Repeat __2__ times per session. Do __2__ sessions per day.   Access Code: [email or web address]  URL: [email or web address]  Date: 04/06/2019  Prepared by: Misty Stanley   Exercises Hooklying Single Knee to Chest - 3 reps - 1 sets - 30 sec hold - 2x daily - 7x weekly Supine Double Knee to Chest - 3 reps - 1 sets - 30 sec hold - 2x daily - 7x weekly Supine Lower Trunk Rotation - 3 reps - 1 sets - 30 sec hold - 2x daily - 7x weekly Seated Heel Raise - 10 reps - 2 sets - 1x  daily - 7x weekly Swiss Ball Knee Extension - 10 reps - 2 sets - 1x daily - 7x weekly Swiss Ball March - 10 reps - 2 sets - 1x daily - 7x weekly Seated Piriformis Stretch - 2 sets - 20 seconds hold - 1x daily - 7x weekly   PT Short  Term Goals - 04/05/19 1605      PT SHORT TERM GOAL #1   Title  independent with initial HEP    Status  Achieved    Target Date  03/26/19      PT SHORT TERM GOAL #2   Title  report pain < 4/10 for improved activity tolerance    Status  Partially Met    Target Date  03/26/19      PT SHORT TERM GOAL #3   Title  report ability to ride in car for at least 30 min without increase in pain for improved tolerance    Status  Achieved    Target Date  03/26/19      PT SHORT TERM GOAL #4   Title  balance to be assessed with formal LTGs to be written    Status  Deferred    Target Date  03/26/19        PT Long Term Goals - 04/06/19 1337      PT LONG TERM GOAL #1   Title  independent with advanced HEP  (ALL LTG DUE 04/23/2019)    Status  New      PT LONG TERM GOAL #2   Title  gait velocity improved to >/= 1.8 ft/sec for decreased fall risk    Status  New      PT LONG TERM GOAL #3   Title  amb> 500' with LRAD mod I without increase in pain for improved mobility    Status  New      PT LONG TERM GOAL #4   Title  negotiate stairs modified independent for improved mobility    Status  New            Plan - 04/06/19 1335    Clinical Impression Statement  Demonstrating improved gait sequence and stride length when ambulating over ground but greater difficulty performing on treadmill.  Continued to review stretches, physioball exercises and x1 viewing for home.  Initiated x1 viewing seated on ball for greater balance challenge.  Pt tolerated well with minimal c/o pain.  Will continue to address in order to progress towards LTG.    Personal Factors and Comorbidities  Comorbidity 2    Comorbidities  HTN, teratoma, incomplete lesion at T10     Examination-Activity Limitations  Sit;Sleep;Stand;Carry;Stairs;Locomotion Level    Examination-Participation Restrictions  Church;Community Activity;Other;Driving   work   [employer]  Evolving/Moderate complexity    Rehab Potential  Good    PT Frequency  2x / week    PT Duration  8 weeks    PT Treatment/Interventions  ADLs/Self Care Home Management;Aquatic Therapy;Cryotherapy;Electrical Stimulation;Moist Heat;Traction;Balance training;Therapeutic exercise;Therapeutic activities;Functional mobility training;Stair training;Gait training;Ultrasound;Neuromuscular re-education;Patient/family education;Manual techniques;Taping;Dry needling    PT Next Visit Plan  Need to update HEP to add standing balance.  Progress habituation and x1 viewing exercises to standing if possible; gait with increased arm swing. treadmill for longer step length.  Pt has been sleeping in recliner since hospital due to pain when fully supine - may have to perform exercises reclined on wedge for improved tolerance and work towards supine.  Work on gait without AD - increasing weight shifting, step length, foot clearance.    PT Home Exercise Plan  Access Code: [email or web address]    Consulted and Agree with Plan of Care  Patient       Patient will benefit from skilled therapeutic intervention in order to improve the following deficits and impairments:  Abnormal gait, Pain, Postural dysfunction, Impaired  flexibility, Increased muscle spasms, Increased fascial restricitons, Decreased strength, Decreased balance, Decreased mobility, Difficulty walking, Decreased range of motion  Visit Diagnosis: Muscle weakness (generalized)  Other symptoms and signs involving the nervous system  Cramp and spasm  Radiculopathy, thoracic region     Problem List Patient Active Problem List   Diagnosis Date Noted  . Transverse myelitis ([place]) 03/13/2019  . Numbness 02/13/2019  . Right leg weakness 02/13/2019  . Lower  extremity weakness 01/16/2019  . 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 Pneumonia due to COVID-19 virus 01/16/2019  . Notalgia 08/10/2017  . Morbid obesity ([place]) 02/18/2017  . OSA (obstructive sleep apnea) 01/24/2017  . 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 Snoring 10/22/2016  . Nocturnal dyspnea/choking   10/22/2016  . Keloid of skin 09/25/2015  . Essential hypertension, benign 09/27/2014  . Teratoma 08/07/2014  . Severe hypertension 06/08/2013  . HYPERLIPIDEMIA, MILD, WITH LOW HDL 07/23/2009  . SHORTNESS OF BREATH 02/11/2009  . HYPERTENSION 05/07/2008  . KELOID SCAR 05/07/2008  . HEADACHE 05/22/2007    Rico Junker, PT, DPT 04/06/19    1:45 PM    [facility] [address] [place], [place], [address] Phone: [phone]   Fax:  [phone]  Name: [name] MRN: [id number] Date of Birth: 1970/09/26

## 2019-04-06 NOTE — Patient Instructions (Addendum)
Gaze Stabilization: Sitting    Keeping eyes on target on wall 3 feet away, and move head side to side for _30__ seconds. Repeat while moving head up and down for 10 repetitions Do __2-3__ sessions per day.  Copyright  VHI. All rights reserved.   Gaze Stabilization: Tip Card  1.Target must remain in focus, not blurry, and appear stationary while head is in motion. 2.Perform exercises with small head movements (45 to either side of midline). 3.Increase speed of head motion so long as target is in focus. 4.If you wear eyeglasses, be sure you can see target through lens (therapist will give specific instructions for bifocal / progressive lenses). 5.These exercises may provoke dizziness or nausea. Work through these symptoms. If too dizzy, slow head movement slightly. Rest between each exercise. 6.Exercises demand concentration; avoid distractions.       Visuo-Vestibular: Head / Eyes Moving in Same Direction    Holding a target, keep eyes on target and slowly move target, head, and eyes in SAME direction side to side for 5 repetitions slowly. Perform sitting. Repeat __2__ times per session. Do __2__ sessions per day.   Access Code: [email or web address]  URL: [email or web address]  Date: 04/06/2019  Prepared by: Misty Stanley   Exercises Hooklying Single Knee to Chest - 3 reps - 1 sets - 30 sec hold - 2x daily - 7x weekly Supine Double Knee to Chest - 3 reps - 1 sets - 30 sec hold - 2x daily - 7x weekly Supine Lower Trunk Rotation - 3 reps - 1 sets - 30 sec hold - 2x daily - 7x weekly Seated Heel Raise - 10 reps - 2 sets - 1x daily - 7x weekly Swiss Ball Knee Extension - 10 reps - 2 sets - 1x daily - 7x weekly Swiss Ball March - 10 reps - 2 sets - 1x daily - 7x weekly Seated Piriformis Stretch - 2 sets - 20 seconds hold - 1x daily - 7x weekly

## 2019-04-10 ENCOUNTER — Telehealth: Payer: Self-pay

## 2019-04-10 NOTE — Telephone Encounter (Signed)
Please disregard another provider is handling this

## 2019-04-10 NOTE — Telephone Encounter (Signed)
Form placed in red folder for disability

## 2019-04-11 ENCOUNTER — Other Ambulatory Visit: Payer: Self-pay

## 2019-04-11 ENCOUNTER — Ambulatory Visit: Payer: BC Managed Care – PPO | Admitting: Physical Therapy

## 2019-04-11 DIAGNOSIS — R29818 Other symptoms and signs involving the nervous system: Secondary | ICD-10-CM

## 2019-04-11 DIAGNOSIS — M6281 Muscle weakness (generalized): Secondary | ICD-10-CM

## 2019-04-12 ENCOUNTER — Encounter: Payer: Self-pay | Admitting: Physical Therapy

## 2019-04-12 NOTE — Therapy (Signed)
[facility] [address] [place], [place], [address] Phone: [phone]   Fax:  [phone]  Physical Therapy Treatment  Patient Details  Name: [name] MRN: [id number] Date of Birth: 04-13-1971 Referring Provider (PT): Britt Bottom, MD   Encounter Date: 04/11/2019  PT End of Session - 04/12/19 1646    Visit Number  11    Number of Visits  16    Date for PT Re-Evaluation  04/23/19    Authorization Type  20 visit limit; deductible met    Authorization - Visit Number  11    Authorization - Number of Visits  20    PT Start Time  1550    PT Stop Time  1630    PT Time Calculation (min)  40 min    Equipment Utilized During Treatment  --   aquatic ankle cuffs and pool noodle   Activity Tolerance  Patient tolerated treatment well    Behavior During Therapy  [facility] for tasks assessed/performed       Past Medical History:  Diagnosis Date  . Hypertension   . Hypoglycemia   . Radiation 10/21/2015-10/23/2015   Anterior neck area 12 gray in 3 fractions  . Teratoma     Past Surgical History:  Procedure Laterality Date  . ABDOMINAL HERNIA REPAIR    . ABDOMINAL HYSTERECTOMY    . CESAREAN SECTION    . CRYOTHERAPY  2013   to neck  . DERMOID CYST  EXCISION    . ECTOPIC PREGNANCY SURGERY    . EXCISION MASS NECK Bilateral 10/20/2015   Procedure: EXCISION OF LARGE KELOIDS SEVERE RIGHT AND LEFT NECK WITH PLASTIC RECONSTRUCTION;  Surgeon: Cristine Polio, MD;  Location: [place];  Service: Plastics;  Laterality: Bilateral;  . HAND SURGERY     amputation of right index finger    There were no vitals filed for this visit.  Subjective Assessment - 04/12/19 1547    Subjective  Pt reports she has been a little dizzy today - thinks it is side effect of metformin medication    Patient is accompained by:  Family member   husband   Limitations  Standing;Walking;Sitting    How long can you sit comfortably?   10-15 min    Patient Stated Goals  walk better, improve sensation, decrease pain    Currently in Pain?  Yes    Pain Score  2     Pain Location  Back    Pain Orientation  Right;Left    Pain Descriptors / Indicators  Tightness;Discomfort    Pain Type  Chronic pain    Pain Onset  More than a month ago    Pain Frequency  Intermittent    Pain Onset  1 to 4 weeks ago           Aquatic therapy at [facility] - pool temp. 87.4 degrees  Patient seen for aquatic therapy today.  Treatment took place in water 3.5-4 feet deep depending upon activity.  Pt entered and exited the pool via ramp negotiation (amb. On ramp with holding onto rails for support and assist with balance).  Pt performed RLE stretches - Runner's stretch and heel cord stretch 30 sec hold x 1 rep each at beginning of session  Pt performed gait training in pool - 4' water depth - forwards 28mx 3 reps with SBA without any UE support; pt initially attempted to swing arms but reported this was causing some imbalance so  pt was instructed to focus on legs only Pt performed backwards amb. 3mx 1 rep holding onto pool noodle without PT's assist to stabilize it;  sideways 236m 2 reps without UE support  Pt performed RLE strengthening exercises - hip flexion, abduction, extension 15 reps each; hip abduction/adduction with Rt knee flexed at 90 degrees with UE support on pool edge prn with use of aquatic ankle cuffs for increased resistance   Squats x 10 reps - bil. LE's; unilateral squats on each leg 10 reps each with CGA to min assist due to difficulty  Marching in place 15 reps   Pt performed pelvic rocking - with her back positioned against pool wall for feedback with exercise - pt performed anterior/posterio pelvic tilt 15 reps without pain free range;  Progressed to performing posterior pelvic tilt (static hold) and lifting each leg (SLR in standing) 10 reps each with posterior pelvic tilt   Pt performed exercises in supine position with  use of flotation belt and pool noodle and therapist's assistance for support & flotation - hip abducton/adduction 2 sets 10 reps, bicycling LE's 20 reps   Pt requires buoyancy of water for off loading her body and for spinal decompression for pain reduction;  Buoyancy of water allows freedom of movement with reduced pain than that experienced with exercise on land;  Buoyancy of water reduces weight of RLE and allows increased AROM and also increased spinal ROM with increased trunk movement  Viscosity of water needed for strengthening in that viscosity provides resistance in combination with support of trunk - pt performed exercises with the current in today's session to allow gradual progression of exercises and to progress to her pain tolerance                              PT Short Term Goals - 04/12/19 1654      PT SHORT TERM GOAL #1   Title  independent with initial HEP    Status  Achieved    Target Date  03/26/19      PT SHORT TERM GOAL #2   Title  report pain < 4/10 for improved activity tolerance    Status  Partially Met    Target Date  03/26/19      PT SHORT TERM GOAL #3   Title  report ability to ride in car for at least 30 min without increase in pain for improved tolerance    Status  Achieved    Target Date  03/26/19      PT SHORT TERM GOAL #4   Title  balance to be assessed with formal LTGs to be written    Status  Deferred    Target Date  03/26/19        PT Long Term Goals - 04/12/19 1654      PT LONG TERM GOAL #1   Title  independent with advanced HEP  (ALL LTG DUE 04/23/2019)    Status  New      PT LONG TERM GOAL #2   Title  gait velocity improved to >/= 1.8 ft/sec for decreased fall risk    Status  New      PT LONG TERM GOAL #3   Title  amb> 500' with LRAD mod I without increase in pain for improved mobility    Status  New      PT LONG TERM GOAL #4   Title  negotiate stairs  modified independent for improved mobility    Status   New            Plan - 04/12/19 1648    Clinical Impression Statement  Pt tolerated aquatic exercises well with mild c/o dizziness initially at start of session, which pt attirbuted to side effect of metformin.  Pt amb. in water forwards without UE support on pool noodle and with assist of pool noodle for backwards ambulation.  Pt reported some mild discomfort with hip exerises with use of ankle cuff which increased ROM with the increased buoyancy, but pt able to control by lifting with smaller ROM and able to complete all 15 reps of all directions.  Pt performed hip and low back exercises in supine position for first time today - and tolerated exs well.  LLE was noted to tremor slightly at end of session due to fatigue.    Personal Factors and Comorbidities  Comorbidity 2    Comorbidities  HTN, teratoma, incomplete lesion at T10    Examination-Activity Limitations  Sit;Sleep;Stand;Carry;Stairs;Locomotion Level    Examination-Participation Restrictions  Church;Community Activity;Other;Driving   work   [employer]  Evolving/Moderate complexity    Rehab Potential  Good    PT Frequency  2x / week    PT Duration  8 weeks    PT Treatment/Interventions  ADLs/Self Care Home Management;Aquatic Therapy;Cryotherapy;Electrical Stimulation;Moist Heat;Traction;Balance training;Therapeutic exercise;Therapeutic activities;Functional mobility training;Stair training;Gait training;Ultrasound;Neuromuscular re-education;Patient/family education;Manual techniques;Taping;Dry needling    PT Next Visit Plan  Need to update HEP to add standing balance.  Progress habituation and x1 viewing exercises to standing if possible; gait with increased arm swing. treadmill for longer step length.  Pt has been sleeping in recliner since hospital due to pain when fully supine - may have to perform exercises reclined on wedge for improved tolerance and work towards supine.  Work on gait without AD - increasing  weight shifting, step length, foot clearance.    PT Home Exercise Plan  Access Code: [email or web address]    Consulted and Agree with Plan of Care  Patient       Patient will benefit from skilled therapeutic intervention in order to improve the following deficits and impairments:  Abnormal gait, Pain, Postural dysfunction, Impaired flexibility, Increased muscle spasms, Increased fascial restricitons, Decreased strength, Decreased balance, Decreased mobility, Difficulty walking, Decreased range of motion  Visit Diagnosis: Muscle weakness (generalized)  Other symptoms and signs involving the nervous system     Problem List Patient Active Problem List   Diagnosis Date Noted  . Transverse myelitis ([place]) 03/13/2019  . Numbness 02/13/2019  . Right leg weakness 02/13/2019  . Lower extremity weakness 01/16/2019  . 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 Pneumonia due to COVID-19 virus 01/16/2019  . Notalgia 08/10/2017  . Morbid obesity ([place]) 02/18/2017  . OSA (obstructive sleep apnea) 01/24/2017  . 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 Snoring 10/22/2016  . Nocturnal dyspnea/choking   10/22/2016  . Keloid of skin 09/25/2015  . Essential hypertension, benign 09/27/2014  . Teratoma 08/07/2014  . Severe hypertension 06/08/2013  . HYPERLIPIDEMIA, MILD, WITH LOW HDL 07/23/2009  . SHORTNESS OF BREATH 02/11/2009  . HYPERTENSION 05/07/2008  . KELOID SCAR 05/07/2008  . HEADACHE 05/22/2007    Alda Lea, PT 04/12/2019, 4:56 PM  [facility] [address] [place], [place], [address] Phone: [phone]   Fax:  [phone]  Name: [name] MRN:  [id number] Date of Birth: 1970/10/23

## 2019-04-13 ENCOUNTER — Encounter: Payer: Self-pay | Admitting: Physical Therapy

## 2019-04-13 ENCOUNTER — Other Ambulatory Visit: Payer: Self-pay

## 2019-04-13 ENCOUNTER — Ambulatory Visit: Payer: BC Managed Care – PPO | Admitting: Physical Therapy

## 2019-04-13 DIAGNOSIS — R29818 Other symptoms and signs involving the nervous system: Secondary | ICD-10-CM | POA: Diagnosis not present

## 2019-04-13 DIAGNOSIS — M6281 Muscle weakness (generalized): Secondary | ICD-10-CM

## 2019-04-13 DIAGNOSIS — M5414 Radiculopathy, thoracic region: Secondary | ICD-10-CM

## 2019-04-13 DIAGNOSIS — R252 Cramp and spasm: Secondary | ICD-10-CM

## 2019-04-13 NOTE — Patient Instructions (Signed)
Gaze Stabilization - Tip Card  1.Target must remain in focus, not blurry, and appear stationary while head is in motion. 2.Perform exercises with small head movements (45 to either side of midline). 3.Increase speed of head motion so long as target is in focus. 4.If you wear eyeglasses, be sure you can see target through lens (therapist will give specific instructions for bifocal / progressive lenses). 5.These exercises may provoke dizziness or nausea. Work through these symptoms. If too dizzy, slow head movement slightly. Rest between each exercise. 6.Exercises demand concentration; avoid distractions. 7.For safety, perform standing exercises close to a counter, wall, corner, or next to someone.  Copyright  VHI. All rights reserved.   Gaze Stabilization - Standing Feet Apart   Feet shoulder width apart, keeping eyes on target on wall 3 feet away, tilt head down slightly and move head side to side for 60 seconds. Repeat while moving head up and down for 60 seconds. *Work up to tolerating 2 minutes as able. Do 2-3 sessions per day.   Copyright  VHI. All rights reserved.        Visuo-Vestibular: Head / Eyes Moving in Same Direction    Holding a target, keep eyes on target and slowly move target, head, and eyes in SAME direction side to side for 10 repetitions slowly.   Then perform moving head, eyes and target all in the same direction UP and DOWN for 10 repetitions.  Perform sitting. Repeat __2__ times per session. Do __2__ sessions per day.         Access Code: [email or web address]  URL: [email or web address]  Date: 04/06/2019  Prepared by: Misty Stanley   Exercises Hooklying Single Knee to Chest - 3 reps - 1 sets - 30 sec hold - 2x daily - 7x weekly Supine Double Knee to Chest - 3 reps - 1 sets - 30 sec hold - 2x daily - 7x weekly Supine Lower Trunk Rotation - 3 reps - 1 sets - 30 sec hold - 2x daily - 7x weekly Seated Heel Raise - 10 reps - 2 sets - 1x daily -  7x weekly Swiss Ball Knee Extension - 10 reps - 2 sets - 1x daily - 7x weekly Swiss Ball March - 10 reps - 2 sets - 1x daily - 7x weekly Seated Piriformis Stretch - 2 sets - 20 seconds hold - 1x daily - 7x weekly

## 2019-04-13 NOTE — Therapy (Signed)
[facility] [address] [place], [place], [address] Phone: [phone]   Fax:  [phone]  Physical Therapy Treatment  Patient Details  Name: [name] MRN: [id number] Date of Birth: [birth year] Referring Provider (PT): Britt Bottom, MD   Encounter Date: 04/13/2019  PT End of Session - 04/13/19 0948    Visit Number  12    Number of Visits  16    Date for PT Re-Evaluation  04/23/19    Authorization Type  20 visit limit; deductible met    Authorization - Visit Number  12    Authorization - Number of Visits  20    PT Start Time  [phone]    PT Stop Time  0931    PT Time Calculation (min)  39 min    Equipment Utilized During Treatment  --   aquatic ankle cuffs and pool noodle   Activity Tolerance  Patient limited by fatigue    Behavior During Therapy  [facility] for tasks assessed/performed       Past Medical History:  Diagnosis Date  . Hypertension   . Hypoglycemia   . Radiation 10/21/2015-10/23/2015   Anterior neck area 12 gray in 3 fractions  . Teratoma     Past Surgical History:  Procedure Laterality Date  . ABDOMINAL HERNIA REPAIR    . ABDOMINAL HYSTERECTOMY    . CESAREAN SECTION    . CRYOTHERAPY  2013   to neck  . DERMOID CYST  EXCISION    . ECTOPIC PREGNANCY SURGERY    . EXCISION MASS NECK Bilateral 10/20/2015   Procedure: EXCISION OF LARGE KELOIDS SEVERE RIGHT AND LEFT NECK WITH PLASTIC RECONSTRUCTION;  Surgeon: Cristine Polio, MD;  Location: [facility];  Service: Plastics;  Laterality: Bilateral;  . HAND SURGERY     amputation of right index finger    There were no vitals filed for this visit.  Subjective Assessment - 04/13/19 0855    Subjective  Had a really good work out at the pool, was really tired yesterday and sore today.  Still doing ball exercises and walking.  Still taking Metformin - but CBG range from 500 - 300.  Feet still feel numb.    Patient is accompained by:   Family member   husband   Limitations  Standing;Walking;Sitting    How long can you sit comfortably?  10-15 min    Patient Stated Goals  walk better, improve sensation, decrease pain    Currently in Pain?  Yes    Pain Location  Generalized    Pain Orientation  Other (Comment)    Pain Descriptors / Indicators  Sore    Pain Onset  More than a month ago    Pain Onset  1 to 4 weeks ago                       [facility] Adult PT Treatment/Exercise - 04/13/19 0910      Lumbar Exercises: Aerobic   Tread Mill  0.5 - 0.8 mph x 6 minutes beginning with bilat UE support progressing to LUE support focusing on R arm swing and therapist providing tactile and verbal cues for increase step length, foot clearance and heel strike      Vestibular Treatment/Exercise - 04/13/19 0912      Vestibular Treatment/Exercise   Vestibular Treatment Provided  Gaze    Gaze Exercises  X1 Viewing Horizontal;X1 Viewing Vertical;Eye/Head Exercise Horizontal;Eye/Head Exercise Vertical  X1 Viewing Horizontal   Foot Position  seated > standing with feet apart    Comments  2 minutes in sitting > 2 minutes in standing but pt having greater retinal slip closer to 2 minutes      X1 Viewing Vertical   Foot Position  seated > standing with feet apart    Comments  2 minutes in sitting > 2 minutes in standing but pt having greater retinal slip closer to 2 minutes      Eye/Head Exercise Horizontal   Foot Position  seated    Reps  10    Comments  x 2 sets - VOR cancellation      Eye/Head Exercise Vertical   Foot Position  seated    Reps  10    Comments  x 2 sets - VOR cancellation; increased symptoms with vertical movements         Gaze Stabilization - Tip Card  1.Target must remain in focus, not blurry, and appear stationary while head is in motion. 2.Perform exercises with small head movements (45 to either side of midline). 3.Increase speed of head motion so long as target is in focus. 4.If you  wear eyeglasses, be sure you can see target through lens (therapist will give specific instructions for bifocal / progressive lenses). 5.These exercises may provoke dizziness or nausea. Work through these symptoms. If too dizzy, slow head movement slightly. Rest between each exercise. 6.Exercises demand concentration; avoid distractions. 7.For safety, perform standing exercises close to a counter, wall, corner, or next to someone.  Copyright  VHI. All rights reserved.   Gaze Stabilization - Standing Feet Apart   Feet shoulder width apart, keeping eyes on target on wall 3 feet away, tilt head down slightly and move head side to side for 60 seconds. Repeat while moving head up and down for 60 seconds. *Work up to tolerating 2 minutes as able. Do 2-3 sessions per day.   Copyright  VHI. All rights reserved.        Visuo-Vestibular: Head / Eyes Moving in Same Direction    Holding a target, keep eyes on target and slowly move target, head, and eyes in SAME direction side to side for 10 repetitions slowly.   Then perform moving head, eyes and target all in the same direction UP and DOWN for 10 repetitions.  Perform sitting. Repeat __2__ times per session. Do __2__ sessions per day.       PT Education - 04/13/19 0948    Education Details  updated vestibular HEP    Person(s) Educated  Patient    Methods  Explanation;Demonstration;Handout    Comprehension  Verbalized understanding;Returned demonstration       PT Short Term Goals - 04/12/19 1654      PT SHORT TERM GOAL #1   Title  independent with initial HEP    Status  Achieved    Target Date  03/26/19      PT SHORT TERM GOAL #2   Title  report pain < 4/10 for improved activity tolerance    Status  Partially Met    Target Date  03/26/19      PT SHORT TERM GOAL #3   Title  report ability to ride in car for at least 30 min without increase in pain for improved tolerance    Status  Achieved    Target Date  03/26/19       PT SHORT TERM GOAL #4   Title  balance to be assessed  with formal LTGs to be written    Status  Deferred    Target Date  03/26/19        PT Long Term Goals - 04/12/19 1654      PT LONG TERM GOAL #1   Title  independent with advanced HEP  (ALL LTG DUE 04/23/2019)    Status  New      PT LONG TERM GOAL #2   Title  gait velocity improved to >/= 1.8 ft/sec for decreased fall risk    Status  New      PT LONG TERM GOAL #3   Title  amb> 500' with LRAD mod I without increase in pain for improved mobility    Status  New      PT LONG TERM GOAL #4   Title  negotiate stairs modified independent for improved mobility    Status  New            Plan - 04/13/19 9030    Clinical Impression Statement  Pt experiencing increased soreness and fatigue following aquatic exercise today but willing to continue with therapy.  Continued to educate pt on "sore but safe" and "motion as lotion" and how newer activities and exercises may set off her "alarm system" but is she is not harming her body with movement and exercise.  Continued endurance and gait training on treadmill gradually increasing speed and decreasing UE support in order to increase arm swing.  Continued to review and progress vestibular exercises focusing on progressing habituation exercises to standing, increasing time, repetitions and range of head movement.  Pt still demonstrates decreased ROM for vertical head movements and sensitivity to visual flow.  Will continue to address in order to progress towards LTG.    Personal Factors and Comorbidities  Comorbidity 2    Comorbidities  HTN, teratoma, incomplete lesion at T10    Examination-Activity Limitations  Sit;Sleep;Stand;Carry;Stairs;Locomotion Level    Examination-Participation Restrictions  Church;Community Activity;Other;Driving   work   [employer] officer  Evolving/Moderate complexity    Rehab Potential  Good    PT Frequency  2x / week    PT Duration  8 weeks    PT  Treatment/Interventions  ADLs/Self Care Home Management;Aquatic Therapy;Cryotherapy;Electrical Stimulation;Moist Heat;Traction;Balance training;Therapeutic exercise;Therapeutic activities;Functional mobility training;Stair training;Gait training;Ultrasound;Neuromuscular re-education;Patient/family education;Manual techniques;Taping;Dry needling    PT Next Visit Plan  Check goals by 9/7; discuss remaining visits as pt has 20 VL.  Add standing balance/strength to HEP; gait with increased arm swing. treadmill for longer step length.  Pt has been sleeping in recliner since hospital due to pain when fully supine - may have to perform exercises reclined on wedge for improved tolerance and work towards supine.  Work on gait without AD - increasing weight shifting, step length, foot clearance.    PT Home Exercise Plan  Access Code: [email or web address]    Consulted and Agree with Plan of Care  Patient       Patient will benefit from skilled therapeutic intervention in order to improve the following deficits and impairments:  Abnormal gait, Pain, Postural dysfunction, Impaired flexibility, Increased muscle spasms, Increased fascial restricitons, Decreased strength, Decreased balance, Decreased mobility, Difficulty walking, Decreased range of motion  Visit Diagnosis: Muscle weakness (generalized)  Other symptoms and signs involving the nervous system  Cramp and spasm  Radiculopathy, thoracic region     Problem List Patient Active Problem List   Diagnosis Date Noted  . Transverse myelitis ([place]) 03/13/2019  . Numbness 02/13/2019  . Right leg weakness  02/13/2019  . Lower extremity weakness 01/16/2019  . 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 Pneumonia due to COVID-19 virus 01/16/2019  . Notalgia 08/10/2017  . Morbid obesity ([place]) 02/18/2017  . OSA (obstructive sleep apnea) 01/24/2017  . 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 Snoring  10/22/2016  . Nocturnal dyspnea/choking   10/22/2016  . Keloid of skin 09/25/2015  . Essential hypertension, benign 09/27/2014  . Teratoma 08/07/2014  . Severe hypertension 06/08/2013  . HYPERLIPIDEMIA, MILD, WITH LOW HDL 07/23/2009  . SHORTNESS OF BREATH 02/11/2009  . HYPERTENSION 05/07/2008  . KELOID SCAR 05/07/2008  . HEADACHE 05/22/2007    Rico Junker, PT, DPT 04/13/19    9:56 AM    [facility] [address] [place], [place], [address] Phone: [phone]   Fax:  [phone]  Name: JASMINA GENDRON MRN: [id number] Date of Birth: [birth year]

## 2019-04-13 NOTE — Progress Notes (Signed)
Chief Complaint  Patient presents with  . Hyperglycemia    Pt present for hyperglycemia. Pt stated that she was told years ago she had hyperglcemia.   . Toe Pain    Pt also c/o of Left big toe pain    HPI:  Rosezella R Probasco 48 y.o. come in for elevated  BG new onset dm  Readings   Had uti and yeast sx and was noted to have glycosuria and bg was in the 400 range  Placed on  Metformin 500 bid since then and now to come in for fu   Vision has changed has polydypsia also   myelitis  Improving   To go back to work in a few weeks   No more steroids at this time   Check left medial toe nail ? If ingrowing  Vs other no trauma but shoes hurt no ulcers  Injury   Bp was up in am but better now and has been ok  ROS: See pertinent positives and negatives per HPI. Mom had pre diabetes and controlled by diet  Past Medical History:  Diagnosis Date  . Hypertension   . Hypoglycemia   . Radiation 10/21/2015-10/23/2015   Anterior neck area 12 gray in 3 fractions  . Teratoma     Family History  Problem Relation Age of Onset  . Hypertension Other     Social History   Socioeconomic History  . Marital status: Married    Spouse name: Not on file  . Number of children: 2  . Years of education: Not on file  . Highest education level: Not on file  Occupational History  . Occupation: Therapist, music  Social Needs  . Financial resource strain: Not on file  . Food insecurity    Worry: Not on file    Inability: Not on file  . Transportation needs    Medical: Not on file    Non-medical: Not on file  Tobacco Use  . Smoking status: Never Smoker  . Smokeless tobacco: Never Used  Substance and Sexual Activity  . Alcohol use: Yes    Comment: socially  . Drug use: No  . Sexual activity: Yes    Birth control/protection: Surgical  Lifestyle  . Physical activity    Days per week: Not on file    Minutes per session: Not on file  . Stress: Not on file  Relationships  . Social Herbalist on phone: Not on file    Gets together: Not on file    Attends religious service: Not on file    Active member of club or organization: Not on file    Attends meetings of clubs or organizations: Not on file    Relationship status: Not on file  Other Topics Concern  . Not on file  Social History Narrative  . Not on file    Outpatient Medications Prior to Visit  Medication Sig Dispense Refill  . baclofen (LIORESAL) 10 MG tablet Take 1 tablet (10 mg total) by mouth 3 (three) times daily. 90 each 3  . cyclobenzaprine (FLEXERIL) 5 MG tablet Take 1 tablet (5 mg total) by mouth 3 (three) times daily as needed for muscle spasms. 90 tablet 2  . fluconazole (DIFLUCAN) 150 MG tablet Take 1 tablet (150 mg total) by mouth daily. Repeat in 1 week if needed 2 tablet 0  . Olmesartan-amLODIPine-HCTZ 40-5-25 MG TABS Take 1 tablet by mouth daily. 90 tablet 2  . vitamin C (VITAMIN C)  500 MG tablet Take 1 tablet (500 mg total) by mouth daily. Please take for 2 weeks    . zinc sulfate 220 (50 Zn) MG capsule Take 1 capsule (220 mg total) by mouth daily. Please take for 2 weeks    . metFORMIN (GLUCOPHAGE) 500 MG tablet Take 1 tablet (500 mg total) by mouth 2 (two) times daily with a meal. 60 tablet 0  . gabapentin (NEURONTIN) 300 MG capsule Take 1 capsule (300 mg total) by mouth 3 (three) times daily. 90 capsule 11  . cephALEXin (KEFLEX) 500 MG capsule Take 1 capsule (500 mg total) by mouth 2 (two) times daily. 14 capsule 0  . HYDROcodone-acetaminophen (NORCO/VICODIN) 5-325 MG tablet Take 1 tablet by mouth every 6 (six) hours as needed for severe pain. 20 tablet 0   No facility-administered medications prior to visit.      EXAM:  BP 102/70   Pulse 71   Temp 98 F (36.7 C)   Wt 243 lb 3.2 oz (110.3 kg)   BMI 32.98 kg/m   Body mass index is 32.98 kg/m.  GENERAL: vitals reviewed and listed above, alert, oriented, appears well hydrated and in no acute distress HEENT: atraumatic, conjunctiva   clear, no obvious abnormalities on inspection of external nose and ears LUNGS: clear to auscultation bilaterally, no wheezes, rales or rhonchi, good air movement CV: HRRR, no clubbing cyanosis   Slow gait   Left great toe  Minimal medial   Tenderness ? Thickened  Nails    PSYCH: pleasant and cooperative, no obvious depression or anxiety Lab Results  Component Value Date   WBC 14.2 (H) 01/20/2019   HGB 13.3 01/20/2019   HCT 40.3 01/20/2019   PLT 307 01/20/2019   GLUCOSE 209 (H) 01/20/2019   CHOL 228 (H) 12/20/2017   TRIG 80 01/17/2019   HDL 40.10 12/20/2017   LDLDIRECT 177.3 07/06/2013   LDLCALC 154 (H) 12/20/2017   ALT 34 01/20/2019   AST 22 01/20/2019   NA 132 (L) 01/20/2019   K 4.2 01/20/2019   CL 96 (L) 01/20/2019   CREATININE 0.82 01/20/2019   BUN 30 (H) 01/20/2019   CO2 25 01/20/2019   TSH 1.27 12/20/2017   HGBA1C 6.5 12/20/2017   BP Readings from Last 3 Encounters:  04/16/19 102/70  04/03/19 (!) 160/93  03/13/19 121/81   Wt Readings from Last 3 Encounters:  04/16/19 243 lb 3.2 oz (110.3 kg)  03/13/19 258 lb 8 oz (117.3 kg)  03/06/19 258 lb 6.4 oz (117.2 kg)   Bp  102/70  bg was 489   ASSESSMENT AND PLAN:  Discussed the following assessment and plan:  Diabetes mellitus, new onset ([place]) - after covid and steroid rx  - Plan: POC Glucose (CBG), Basic metabolic panel, Hemoglobin A1c, Hemoglobin 123456, Basic metabolic panel Extremely high bg  Obviously not controlled but  1000 mg metformin  No longer on steroids   Max out metformin  And in short run add  Insuline 10 u per day basaglar   And if not coming down below 300 call us and will plan increase dose   Hopeful  can get to the point can maintain on oral meds but severity warrants   Insulin at this time .   Keep her sept  cpx appt and let us know info in the interim .   Her vision changes are secondary most likely from hyperglycemia   Given accucheck machine to daa  ? If  Mild ingrowing nail no obv  Acute issue on  exam  -Patient advised to return or notify health care team  if  new concerns arise. In the interim   Patient Instructions   Health Maintenance Due  Topic Date Due  . TETANUS/TDAP  08/17/2015  . PAP SMEAR-Modifier  08/21/2016  . INFLUENZA VACCINE  03/17/2019    Your sugar is too high today   Increase metformin to 500 mg  4 per day  As tolerated  Can  Split up in day .    begin  Insulin  10 units per day    basiglar (until  Under better control and then we can try to address adding other medications )  Please check BG readings  Twice a day for the next 2 weeks   And record   ROV in person or video  In    About 10 days or thereabouts . With readings     then go from there .   No sugar drinks at all .     Standley Brooking. Panosh M.D.

## 2019-04-16 ENCOUNTER — Ambulatory Visit (INDEPENDENT_AMBULATORY_CARE_PROVIDER_SITE_OTHER): Payer: BC Managed Care – PPO | Admitting: Internal Medicine

## 2019-04-16 ENCOUNTER — Encounter: Payer: Self-pay | Admitting: Internal Medicine

## 2019-04-16 ENCOUNTER — Encounter: Payer: Self-pay | Admitting: Physical Therapy

## 2019-04-16 ENCOUNTER — Other Ambulatory Visit: Payer: Self-pay

## 2019-04-16 ENCOUNTER — Ambulatory Visit: Payer: BC Managed Care – PPO | Admitting: Physical Therapy

## 2019-04-16 VITALS — BP 102/70 | HR 71 | Temp 98.0°F | Wt 243.2 lb

## 2019-04-16 DIAGNOSIS — R252 Cramp and spasm: Secondary | ICD-10-CM

## 2019-04-16 DIAGNOSIS — E119 Type 2 diabetes mellitus without complications: Secondary | ICD-10-CM | POA: Diagnosis not present

## 2019-04-16 DIAGNOSIS — M6281 Muscle weakness (generalized): Secondary | ICD-10-CM

## 2019-04-16 DIAGNOSIS — R29818 Other symptoms and signs involving the nervous system: Secondary | ICD-10-CM | POA: Diagnosis not present

## 2019-04-16 DIAGNOSIS — M5414 Radiculopathy, thoracic region: Secondary | ICD-10-CM

## 2019-04-16 LAB — GLUCOSE, POCT (MANUAL RESULT ENTRY): POC Glucose: 489 mg/dl — AB (ref 70–99)

## 2019-04-16 MED ORDER — BASAGLAR KWIKPEN 100 UNIT/ML ~~LOC~~ SOPN: 10.0000 [IU] | PEN_INJECTOR | Freq: Every day | SUBCUTANEOUS | 0 refills | Status: DC

## 2019-04-16 MED ORDER — METFORMIN HCL 500 MG PO TABS: ORAL_TABLET | ORAL | 1 refills | Status: DC

## 2019-04-16 NOTE — Patient Instructions (Addendum)
Health Maintenance Due  Topic Date Due  . TETANUS/TDAP  08/17/2015  . PAP SMEAR-Modifier  08/21/2016  . INFLUENZA VACCINE  03/17/2019    Your sugar is too high today   Increase metformin to 500 mg  4 per day  As tolerated  Can  Split up in day .    begin  Insulin  10 units per day    basiglar (until  Under better control and then we can try to address adding other medications )  Please check BG readings  Twice a day for the next 2 weeks   And record   ROV in person or video  In    About 10 days or thereabouts . With readings     then go from there .   No sugar drinks at all .
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
## 2019-04-17 LAB — BASIC METABOLIC PANEL
BUN: 18 mg/dL (ref 6–23)
CO2: 26 mEq/L (ref 19–32)
Calcium: 10.5 mg/dL (ref 8.4–10.5)
Chloride: 95 mEq/L — ABNORMAL LOW (ref 96–112)
Creatinine, Ser: 1.1 mg/dL (ref 0.40–1.20)
GFR: 63.92 mL/min (ref >60.00)
Glucose, Bld: 404 mg/dL — ABNORMAL HIGH (ref 70–99)
Potassium: 3.9 mEq/L (ref 3.5–5.1)
Sodium: 133 mEq/L — ABNORMAL LOW (ref 135–145)

## 2019-04-17 LAB — HEMOGLOBIN A1C: Hgb A1c MFr Bld: 12.6 % — ABNORMAL HIGH (ref 4.6–6.5)

## 2019-04-18 ENCOUNTER — Ambulatory Visit: Payer: BC Managed Care – PPO | Attending: Neurology | Admitting: Physical Therapy

## 2019-04-18 ENCOUNTER — Other Ambulatory Visit: Payer: Self-pay

## 2019-04-18 DIAGNOSIS — R29818 Other symptoms and signs involving the nervous system: Secondary | ICD-10-CM | POA: Insufficient documentation

## 2019-04-18 DIAGNOSIS — M6281 Muscle weakness (generalized): Secondary | ICD-10-CM | POA: Insufficient documentation

## 2019-04-19 NOTE — Therapy (Signed)
[facility] [address] [place], [place], [address] Phone: [phone]   Fax:  [phone]  Physical Therapy Treatment  Patient Details  Name: [name] MRN: [id number] Date of Birth: November 12, 1970 Referring Provider (PT): Britt Bottom, MD   Encounter Date: 04/18/2019  PT End of Session - 04/19/19 2222    Visit Number  14    Number of Visits  16    Date for PT Re-Evaluation  04/23/19    Authorization Type  20 visit limit; deductible met    Authorization - Visit Number  14    Authorization - Number of Visits  20    PT Start Time  8841    PT Stop Time  1600    PT Time Calculation (min)  45 min    Activity Tolerance  Patient tolerated treatment well    Behavior During Therapy  [facility] for tasks assessed/performed       Past Medical History:  Diagnosis Date  . Hypertension   . Hypoglycemia   . Radiation 10/21/2015-10/23/2015   Anterior neck area 12 gray in 3 fractions  . Teratoma     Past Surgical History:  Procedure Laterality Date  . ABDOMINAL HERNIA REPAIR    . ABDOMINAL HYSTERECTOMY    . CESAREAN SECTION    . CRYOTHERAPY  2013   to neck  . DERMOID CYST  EXCISION    . ECTOPIC PREGNANCY SURGERY    . EXCISION MASS NECK Bilateral 10/20/2015   Procedure: EXCISION OF LARGE KELOIDS SEVERE RIGHT AND LEFT NECK WITH PLASTIC RECONSTRUCTION;  Surgeon: Cristine Polio, MD;  Location: [facility];  Service: Plastics;  Laterality: Bilateral;  . HAND SURGERY     amputation of right index finger    There were no vitals filed for this visit.  Subjective Assessment - 04/19/19 2221    Subjective  Pt reports she was really sore after previous aquatic therapy session;  feels that she is walking better overall    Patient is accompained by:  Family member   husband   Limitations  Standing;Walking;Sitting    How long can you sit comfortably?  10-15 min    Patient Stated Goals  walk better, improve  sensation, decrease pain    Currently in Pain?  No/denies    Pain Onset  More than a month ago    Pain Onset  1 to 4 weeks ago                     Aquatic therapy at [facility] - pool temp. 87.2 degrees  Patient seen for aquatic therapy today.  Treatment took place in water 3.5-4 feet deep depending upon activity.  Pt entered and exited the pool via ramp negotiation (amb. On ramp with holding onto rails for support and assist with balance).  Pt performed RLE stretches - Runner's stretch and heel cord stretch 30 sec hold x 1 rep each at beginning and end of session  Pt performed gait training in pool - 4' water depth - forwards 27mx 4 reps with no UE support  Pt performed backwards amb. 282m 1 rep holding onto pool noodle  and sideways 2572m2 reps with small squats without UE support   Pt performed RLE strengthening exercises - hip flexion, abduction, extension 15 reps each; hip abduction/adduction with Rt knee flexed at 90 degrees with UE support on pool edge with use of buoyant ankle cuff on each leg  15 reps each direction Squats x 10 reps - bil. LE's:  RLE unilateral squats 10 reps with UE support on pool edge Marching in place 15 reps and then marching forwards across pool 38m  Pt performed pelvic rocking in standing - pt's back positioned against pool wall in 4' water depth - approx 10 reps-less pain reported with this exercise today  Pt requires buoyancy of water for off loading her body and for spinal decompression for pain reduction;  Buoyancy of water allows freedom of movement with reduced pain than that experienced with exercise on land;  Buoyancy of water reduces weight of RLE and allows increased AROM and also increased spinal ROM with increased trunk movement  Viscosity of water needed for strengthening in that viscosity provides resistance in combination with support of trunk - pt performed exercises with the current in today's session to allow gradual progression of  exercises and to progress to her pain tolerance                                PT Short Term Goals - 04/19/19 2232      PT SHORT TERM GOAL #1   Title  independent with initial HEP    Status  Achieved    Target Date  03/26/19      PT SHORT TERM GOAL #2   Title  report pain < 4/10 for improved activity tolerance    Status  Partially Met    Target Date  03/26/19      PT SHORT TERM GOAL #3   Title  report ability to ride in car for at least 30 min without increase in pain for improved tolerance    Status  Achieved    Target Date  03/26/19      PT SHORT TERM GOAL #4   Title  balance to be assessed with formal LTGs to be written    Status  Deferred    Target Date  03/26/19        PT Long Term Goals - 04/19/19 2232      PT LONG TERM GOAL #1   Title  independent with advanced HEP  (ALL LTG DUE 04/23/2019)    Status  New      PT LONG TERM GOAL #2   Title  gait velocity improved to >/= 1.8 ft/sec for decreased fall risk    Status  New      PT LONG TERM GOAL #3   Title  amb> 500' with LRAD mod I without increase in pain for improved mobility    Status  New      PT LONG TERM GOAL #4   Title  negotiate stairs modified independent for improved mobility    Status  New            Plan - 04/19/19 2225    Clinical Impression Statement  Pt reported that she felt the area in her shoulder while performing amb. with use of bar bells for incr. resistance - stated it was not a true pain, but she was able to feel it working.  Balance is improving with amb. in the water as pt able to amb. without holding noodle or barbell, but does need assistance of noodle for assistance with balance with backwards amb.       Patient will benefit from skilled therapeutic intervention in order to improve the following deficits and impairments:  Visit Diagnosis: Muscle weakness (generalized)  Other symptoms and signs involving the nervous system     Problem  List Patient Active Problem List   Diagnosis Date Noted  . Transverse myelitis ([place]) 03/13/2019  . Numbness 02/13/2019  . Right leg weakness 02/13/2019  . Lower extremity weakness 01/16/2019  . Other incomplete lesion at T10 level of thoracic spinal cord, initial encounter ([place]) 01/16/2019  . Pneumonia due to COVID-19 virus 01/16/2019  . Notalgia 08/10/2017  . Morbid obesity ([place]) 02/18/2017  . OSA (obstructive sleep apnea) 01/24/2017  . 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 Snoring 10/22/2016  . Nocturnal dyspnea/choking   10/22/2016  . Keloid of skin 09/25/2015  . Essential hypertension, benign 09/27/2014  . Teratoma 08/07/2014  . Severe hypertension 06/08/2013  . HYPERLIPIDEMIA, MILD, WITH LOW HDL 07/23/2009  . SHORTNESS OF BREATH 02/11/2009  . HYPERTENSION 05/07/2008  . KELOID SCAR 05/07/2008  . HEADACHE 05/22/2007    DildayJenness Corner, PT, ATRIC 04/19/2019, 10:34 PM  [facility] [address] [place], [place], [address] Phone: [phone]   Fax:  [phone]  Name: [name] MRN: [id number] Date of Birth: 1971/07/03

## 2019-04-20 ENCOUNTER — Ambulatory Visit: Payer: Self-pay | Admitting: *Deleted

## 2019-04-20 ENCOUNTER — Telehealth: Payer: Self-pay

## 2019-04-20 NOTE — Telephone Encounter (Signed)
basaglar in front pod fridge for pt to pick up with pt name on it

## 2019-04-20 NOTE — Telephone Encounter (Signed)
Patient is calling for advise on Insulin Glargine [facility]) 100 UNIT/ML SOPN [id number] Dr. Regis Bill advised her not to inject herself with air bubbles in the needle. She can not get the air bubbles out. Is it ok to use it? Please advise   Call to patient - patient states she took the pen to the pharmacy and they could not get the air bubbles out. Patient is concerned about all the tiny air bubbles. Call to office to see if she can get another pen- it could not be replaced at pharmacy because it was a sample. Call transferred to office.  Reason for Disposition . [1] Caller has URGENT medication question about med that PCP or specialist prescribed AND [2] triager unable to answer question  Protocols used: MEDICATION QUESTION CALL-A-AH

## 2019-04-20 NOTE — Telephone Encounter (Signed)
This has been taken care of.

## 2019-04-23 ENCOUNTER — Other Ambulatory Visit: Payer: Self-pay

## 2019-04-23 ENCOUNTER — Ambulatory Visit (HOSPITAL_COMMUNITY)
Admission: EM | Admit: 2019-04-23 | Discharge: 2019-04-23 | Disposition: A | Discharge status: Home / Self Care | Payer: BC Managed Care – PPO | Attending: Family Medicine | Admitting: Family Medicine

## 2019-04-23 ENCOUNTER — Encounter (HOSPITAL_COMMUNITY): Payer: Self-pay

## 2019-04-23 DIAGNOSIS — I10 Essential (primary) hypertension: Secondary | ICD-10-CM | POA: Diagnosis not present

## 2019-04-23 DIAGNOSIS — T148XXA Other injury of unspecified body region, initial encounter: Secondary | ICD-10-CM

## 2019-04-23 LAB — POCT URINALYSIS DIP (DEVICE)
Bilirubin Urine: NEGATIVE
Glucose, UA: >=1000 mg/dL — AB
Ketones, ur: NEGATIVE mg/dL
Leukocytes,Ua: NEGATIVE
Nitrite: NEGATIVE
Protein, ur: NEGATIVE mg/dL
Specific Gravity, Urine: 1.015 (ref 1.005–1.030)
Urobilinogen, UA: 0.2 mg/dL (ref 0.0–1.0)
pH: 5 (ref 5.0–8.0)

## 2019-04-23 MED ORDER — FLUCONAZOLE 150 MG PO TABS: 150.0000 mg | ORAL_TABLET | Freq: Every day | ORAL | 0 refills | Status: DC

## 2019-04-23 NOTE — ED Triage Notes (Signed)
Patient presents to Urgent Care with complaints of hypertension since two weeks ago. Patient reports she went to her PCP and was given a "sample" and her metformin was increased. Pt states her glucose is beginning to stabilize, but her BP has gone up and hs been reading around 170/110. Pt has been having headaches but just got new glasses and thought that was the cause.  Pt also has right lower back pain, has had recent UTI and yeast infections.

## 2019-04-23 NOTE — ED Provider Notes (Signed)
[place]    CSN: [id number] Arrival date & time: 04/23/19  1022      History   Chief Complaint Chief Complaint  Patient presents with  . Hypertension    HPI [name] is a 48 y.o. female.   HPI  I saw [name] in the office recently and diagnosed her with new onset diabetes.  I started her on metformin 500 mg twice daily and sent her back to her PCP.  Her PCP has increased her metformin.  I started her on insulin.  Is talked to her a bit about diet and exercise.  She also has hypertension and she takes losartan.  She is not on a statin and I tell her that she needs to be.  She also needs to go see a diabetes educator and I think she should recommend this to her physician. She is here today because she has not been feeling well.  She took her blood pressure this morning it was 170/100.  Her blood pressure is never this high.  She is thought that this was dangerous so she came in to be evaluated.  Her blood pressure here is 135/89.  Her blood pressure at her doctor's office a week or 2 ago was quite low.  I told her that although that blood pressure was higher than is desirable for long-term management, a single blood pressure of 170/100 is not dangerous.  Patient still has a lot of questions about management of her diabetes.  This is been surprising for her and somewhat stressful.  She has had some back and neck pain, and some headaches.  I think this is muscular tension and discussed this with her as well.  Past Medical History:  Diagnosis Date  . Hypertension   . Hypoglycemia   . Radiation 10/21/2015-10/23/2015   Anterior neck area 12 gray in 3 fractions  . Teratoma     Patient Active Problem List   Diagnosis Date Noted  . Transverse myelitis ([place]) 03/13/2019  . Numbness 02/13/2019  . Right leg weakness 02/13/2019  . Lower extremity weakness 01/16/2019  . Other incomplete lesion at T10 level of thoracic spinal cord, initial encounter ([name]) 01/16/2019  .  Pneumonia due to COVID-19 virus 01/16/2019  . Notalgia 08/10/2017  . Morbid obesity ([place]) 02/18/2017  . OSA (obstructive sleep apnea) 01/24/2017  . 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 Snoring 10/22/2016  . Nocturnal dyspnea/choking   10/22/2016  . Keloid of skin 09/25/2015  . Essential hypertension, benign 09/27/2014  . Teratoma 08/07/2014  . Severe hypertension 06/08/2013  . HYPERLIPIDEMIA, MILD, WITH LOW HDL 07/23/2009  . SHORTNESS OF BREATH 02/11/2009  . HYPERTENSION 05/07/2008  . KELOID SCAR 05/07/2008  . HEADACHE 05/22/2007    Past Surgical History:  Procedure Laterality Date  . ABDOMINAL HERNIA REPAIR    . ABDOMINAL HYSTERECTOMY    . CESAREAN SECTION    . CRYOTHERAPY  2013   to neck  . DERMOID CYST  EXCISION    . ECTOPIC PREGNANCY SURGERY    . EXCISION MASS NECK Bilateral 10/20/2015   Procedure: EXCISION OF LARGE KELOIDS SEVERE RIGHT AND LEFT NECK WITH PLASTIC RECONSTRUCTION;  Surgeon: Cristine Polio, MD;  Location: [facility];  Service: Plastics;  Laterality: Bilateral;  . HAND SURGERY     amputation of right index finger    OB History   No obstetric history on file.      Home Medications    Prior to Admission medications  Medication Sig Start Date End Date Taking? Authorizing Provider  baclofen (LIORESAL) 10 MG tablet Take 1 tablet (10 mg total) by mouth 3 (three) times daily. 03/13/19   Sater, Nanine Means, MD  cyclobenzaprine (FLEXERIL) 5 MG tablet Take 1 tablet (5 mg total) by mouth 3 (three) times daily as needed for muscle spasms. 02/13/19   Sater, Nanine Means, MD  fluconazole (DIFLUCAN) 150 MG tablet Take 1 tablet (150 mg total) by mouth daily. Repeat in 1 week if needed 04/23/19   Raylene Everts, MD  gabapentin (NEURONTIN) 300 MG capsule Take 1 capsule (300 mg total) by mouth 3 (three) times daily. 02/13/19   Sater, Nanine Means, MD  Insulin Glargine (BASAGLAR KWIKPEN) 100 UNIT/ML SOPN Inject 0.1 mLs (10 Units total) into the skin  daily. Or as directed 04/16/19   Panosh, Standley Brooking, MD  metFORMIN (GLUCOPHAGE) 500 MG tablet Take 4   Tabs per day 04/16/19   Panosh, Standley Brooking, MD  Olmesartan-amLODIPine-HCTZ 40-5-25 MG TABS Take 1 tablet by mouth daily. 01/25/19   Panosh, Standley Brooking, MD  vitamin C (VITAMIN C) 500 MG tablet Take 1 tablet (500 mg total) by mouth daily. Please take for 2 weeks 01/21/19   Elgergawy, Silver Huguenin, MD  zinc sulfate 220 (50 Zn) MG capsule Take 1 capsule (220 mg total) by mouth daily. Please take for 2 weeks 01/21/19   Elgergawy, Silver Huguenin, MD    Family History Family History  Problem Relation Age of Onset  . Hypertension Other   . Hypertension Mother   . Diabetes Mother   . Diabetes Father   . Thyroid disease Father   . Heart failure Father     Social History Social History   Tobacco Use  . Smoking status: Never Smoker  . Smokeless tobacco: Never Used  Substance Use Topics  . Alcohol use: Yes    Comment: socially  . Drug use: No     Allergies   Tylox [oxycodone-acetaminophen]   Review of Systems Review of Systems  Constitutional: Negative for chills, fever and unexpected weight change.  HENT: Negative for ear pain and sore throat.   Eyes: Negative for pain and visual disturbance.  Respiratory: Negative for cough and shortness of breath.   Cardiovascular: Negative for chest pain and palpitations.  Gastrointestinal: Negative for abdominal pain and vomiting.  Endocrine: Positive for polydipsia.       Improving  Genitourinary: Negative for dysuria and hematuria.  Musculoskeletal: Negative for arthralgias and back pain.  Skin: Negative for color change and rash.  Neurological: Positive for headaches. Negative for seizures and syncope.       Intermittent  All other systems reviewed and are negative.    Physical Exam Triage Vital Signs ED Triage Vitals  Enc Vitals Group     BP 04/23/19 1121 135/89     Pulse Rate 04/23/19 1121 75     Resp 04/23/19 1121 18     Temp 04/23/19 1121 97.8 F  (36.6 C)     Temp Source 04/23/19 1121 Temporal     SpO2 04/23/19 1121 99 %     Weight --      Height --      Head Circumference --      Peak Flow --      Pain Score 04/23/19 1119 6     Pain Loc --      Pain Edu? --      Excl. in [place]? --    No data found.  Updated Vital Signs BP 135/89 (BP Location: Right Arm)   Pulse 75   Temp 97.8 F (36.6 C) (Temporal)   Resp 18   SpO2 99%       Physical Exam Constitutional:      General: She is not in acute distress.    Appearance: She is well-developed and normal weight.  HENT:     Head: Normocephalic and atraumatic.     Nose: Nose normal.     Mouth/Throat:     Mouth: Mucous membranes are moist.  Eyes:     Conjunctiva/sclera: Conjunctivae normal.     Pupils: Pupils are equal, round, and reactive to light.  Neck:     Musculoskeletal: Normal range of motion.  Cardiovascular:     Rate and Rhythm: Normal rate and regular rhythm.     Heart sounds: Normal heart sounds.  Pulmonary:     Effort: Pulmonary effort is normal. No respiratory distress.     Breath sounds: Normal breath sounds.  Abdominal:     General: Abdomen is flat. There is no distension.     Palpations: Abdomen is soft.  Musculoskeletal: Normal range of motion.        General: Tenderness present.       Back:  Skin:    General: Skin is warm and dry.  Neurological:     General: No focal deficit present.     Mental Status: She is alert and oriented to person, place, and time.  Psychiatric:        Mood and Affect: Mood normal.        Behavior: Behavior normal.      UC Treatments / Results  Labs (all labs ordered are listed, but only abnormal results are displayed) Labs Reviewed  POCT URINALYSIS DIP (DEVICE) - Abnormal; Notable for the following components:      Result Value   Glucose, UA >=1000 (*)    Hgb urine dipstick TRACE (*)    All other components within normal limits    EKG   Radiology No results found.  Procedures Procedures (including  critical care time)  Medications Ordered in UC Medications - No data to display  Initial Impression / Assessment and Plan / UC Course  I have reviewed the triage vital signs and the nursing notes.  Pertinent labs & imaging results that were available during my care of the patient were reviewed by me and considered in my medical decision making (see chart for details).    We discussed DM . HTN  DIET Greater than 50% of this visit was spent in counseling and coordinating care.  Total face to face time:  25 min  Final Clinical Impressions(s) / UC Diagnoses   Final diagnoses:  Essential hypertension  Muscle strain     Discharge Instructions     I recommend that you take your muscle relaxer.  You have tight muscles in her shoulders and in your right low back. You may take ibuprofen 3 times a day with food for pain and headache Continue your other medications.  Increase metformin to 2 pills in the morning and 2 pills at night When you follow-up with your physician discuss cholesterol management, also discuss referral to a diabetes educator Continue to eat well and take care of yourself    ED Prescriptions    Medication Sig Dispense Auth. Provider   fluconazole (DIFLUCAN) 150 MG tablet Take 1 tablet (150 mg total) by mouth daily. Repeat in 1 week if needed 2 tablet Meda Coffee,  Jennette Banker, MD     Controlled Substance Prescriptions [place] Controlled Substance Registry consulted? Not Applicable   Raylene Everts, MD 04/23/19 2138

## 2019-04-23 NOTE — Discharge Instructions (Signed)
I recommend that you take your muscle relaxer.  You have tight muscles in her shoulders and in your right low back. You may take ibuprofen 3 times a day with food for pain and headache Continue your other medications.  Increase metformin to 2 pills in the morning and 2 pills at night When you follow-up with your physician discuss cholesterol management, also discuss referral to a diabetes educator Continue to eat well and take care of yourself

## 2019-04-27 ENCOUNTER — Ambulatory Visit: Payer: BC Managed Care – PPO | Admitting: Physical Therapy

## 2019-04-30 ENCOUNTER — Ambulatory Visit: Payer: BC Managed Care – PPO | Admitting: Physical Therapy

## 2019-05-02 NOTE — Telephone Encounter (Addendum)
Please contact patient and tell me  what her sugars are doing   She has appt on Friday but  I  Just discovered  that  she went to ed for  Severe bp elevation that improved   On recheck ,    but no information about her BGs .   I suspect that she will need  Adjustment or more r  Medications. To get the  Glucose down . Quicker

## 2019-05-02 NOTE — Progress Notes (Signed)
Chief Complaint  Patient presents with  . Annual Exam    Pt wants to discuss sugars and blood pressure     HPI: Patient  [name]  48 y.o. comes in today for Preventive Health Care visit    More walking and   Therapy  Getting better .    To see neurrologist  And fu.   Right leg weakness  Some better getting pt rehab    DM  bg coming down only able to tolerate    2 metformin per day  For now.   onlyu talking 2 uintes of insulin per day   miss understood  Direction  But dowing better this week am sugars are about 150  And later in 200s  Range  No recnet 300 and above  visiton changing   Glasses hard to see up close    Her vision changes are secondary most likely from hyperglycemia   Seen in uc for atypical cp and arm sx  bp up and then normal    Feeling better   No new sx    Health Maintenance  Topic Date Due  . PAP SMEAR-Modifier  05/04/2019 (Originally 08/21/2016)  . TETANUS/TDAP  05/03/2020 (Originally 08/17/2015)  . HIV Screening  Completed  . INFLUENZA VACCINE  Discontinued   Health Maintenance Review LIFESTYLE:  Exercise:   Tobacco/ETS: Alcohol:  Sugar beverages: Sleep: Drug use: no HH of  Work:    ROS:  GEN/ HEENT: No fever, significant weight changes sweats headaches vision problems hearing changes, CV/ PULM; No chest pain shortness of breath cough, syncope,edema  change in exercise tolerance. GI /GU: No adominal pain, vomiting, change in bowel habits. No blood in the stool. No significant GU symptoms. SKIN/HEME: ,no acute skin rashes suspicious lesions or bleeding. No lymphadenopathy, nodules, masses.  NEURO/ PSYCH:  No neurologic signs such as weakness numbness. No depression anxiety. IMM/ Allergy: No unusual infections.  Allergy .   REST of 12 system review negative except as per HPI   Past Medical History:  Diagnosis Date  . Hypertension   . Hypoglycemia   . Radiation 10/21/2015-10/23/2015   Anterior neck area 12 gray in 3 fractions  .  Teratoma     Past Surgical History:  Procedure Laterality Date  . ABDOMINAL HERNIA REPAIR    . ABDOMINAL HYSTERECTOMY    . CESAREAN SECTION    . CRYOTHERAPY  2013   to neck  . DERMOID CYST  EXCISION    . ECTOPIC PREGNANCY SURGERY    . EXCISION MASS NECK Bilateral 10/20/2015   Procedure: EXCISION OF LARGE KELOIDS SEVERE RIGHT AND LEFT NECK WITH PLASTIC RECONSTRUCTION;  Surgeon: Cristine Polio, MD;  Location: [facility];  Service: Plastics;  Laterality: Bilateral;  . HAND SURGERY     amputation of right index finger    Family History  Problem Relation Age of Onset  . Hypertension Other   . Hypertension Mother   . Diabetes Mother   . Diabetes Father   . Thyroid disease Father   . Heart failure Father     Social History   Socioeconomic History  . Marital status: Married    Spouse name: Not on file  . Number of children: 2  . Years of education: Not on file  . Highest education level: Not on file  Occupational History  . Occupation: Therapist, music  Social Needs  . Financial resource strain: Not on file  . Food insecurity    Worry:  Not on file    Inability: Not on file  . Transportation needs    Medical: Not on file    Non-medical: Not on file  Tobacco Use  . Smoking status: Never Smoker  . Smokeless tobacco: Never Used  Substance and Sexual Activity  . Alcohol use: Yes    Comment: socially  . Drug use: No  . Sexual activity: Yes    Birth control/protection: Surgical  Lifestyle  . Physical activity    Days per week: Not on file    Minutes per session: Not on file  . Stress: Not on file  Relationships  . Social Herbalist on phone: Not on file    Gets together: Not on file    Attends religious service: Not on file    Active member of club or organization: Not on file    Attends meetings of clubs or organizations: Not on file    Relationship status: Not on file  Other Topics Concern  . Not on file  Social History Narrative  .  Not on file    Outpatient Medications Prior to Visit  Medication Sig Dispense Refill  . baclofen (LIORESAL) 10 MG tablet Take 1 tablet (10 mg total) by mouth 3 (three) times daily. 90 each 3  . cyclobenzaprine (FLEXERIL) 5 MG tablet Take 1 tablet (5 mg total) by mouth 3 (three) times daily as needed for muscle spasms. 90 tablet 2  . fluconazole (DIFLUCAN) 150 MG tablet Take 1 tablet (150 mg total) by mouth daily. Repeat in 1 week if needed 2 tablet 0  . gabapentin (NEURONTIN) 300 MG capsule Take 1 capsule (300 mg total) by mouth 3 (three) times daily. 90 capsule 11  . metFORMIN (GLUCOPHAGE) 500 MG tablet Take 4   Tabs per day 120 tablet 1  . Olmesartan-amLODIPine-HCTZ 40-5-25 MG TABS Take 1 tablet by mouth daily. 90 tablet 2  . vitamin C (VITAMIN C) 500 MG tablet Take 1 tablet (500 mg total) by mouth daily. Please take for 2 weeks    . zinc sulfate 220 (50 Zn) MG capsule Take 1 capsule (220 mg total) by mouth daily. Please take for 2 weeks    . Insulin Glargine (BASAGLAR KWIKPEN) 100 UNIT/ML SOPN Inject 0.1 mLs (10 Units total) into the skin daily. Or as directed 1 pen 0   No facility-administered medications prior to visit.      EXAM:  BP 130/80 (BP Location: Right Arm, Patient Position: Sitting, Cuff Size: Large)   Pulse 80   Temp 98 F (36.7 C) (Temporal)   Ht 6' (1.829 m)   Wt 241 lb 3.2 oz (109.4 kg)   SpO2 98%   BMI 32.71 kg/m   Body mass index is 32.71 kg/m. Wt Readings from Last 3 Encounters:  05/04/19 241 lb 3.2 oz (109.4 kg)  04/16/19 243 lb 3.2 oz (110.3 kg)  03/13/19 258 lb 8 oz (117.3 kg)    Physical Exam: Vital signs reviewed [id number] is a well-developed well-nourished alert cooperative    who appearsr stated age in no acute distress.  HEENT: normocephalic atraumatic , Eyes: PERRL EOM's full, conjunctiva clear, Nares: paten,t no deformity discharge or tenderness., Ears: no deformity EAC's clear TMs with normal landmarks. Mouth: clear OP, masked NECK: supple  without masses, thyromegaly or bruits. CHEST/PULM:  Clear to auscultation and percussion breath sounds equal no wheeze , rales or rhonchi. No chest wall deformities or tenderness. Breast: normal by inspection . No dimpling, discharge, masses,  tenderness or discharge . Keloids  CV: PMI is nondisplaced, S1 S2 no gallops, murmurs, rubs. Peripheral pulses are full without delay.No JVD .  ABDOMEN: Bowel sounds normal nontender  No guard or rebound, no hepato splenomegal no CVA tenderness.  No hernia. Extremtities:  No clubbing cyanosis or edema, no acute joint swelling or redness no focal atrophy NEURO:  Oriented x3, cranial nerves 3-12 appear to be intact, mild weakness r leg? Dec sense  both feet  SKIN: No acute rashes normal turgor, color, no bruising or petechiae. Multiple keloids   PSYCH: Oriented, good eye contact, no obvious depression anxiety, cognition and judgment appear normal. LN: no cervical axillary inguinal adenopathy Diabetic Foot Exam - Simple   Simple Foot Form Visual Inspection No deformities, no ulcerations, no other skin breakdown bilaterally: Yes Sensation Testing See comments: Yes Pulse Check Posterior Tibialis and Dorsalis pulse intact bilaterally: Yes Comments Dec sense distally toes right more than left      Lab Results  Component Value Date   WBC 14.2 (H) 01/20/2019   HGB 13.3 01/20/2019   HCT 40.3 01/20/2019   PLT 307 01/20/2019   GLUCOSE 404 (H) 04/16/2019   CHOL 228 (H) 12/20/2017   TRIG 80 01/17/2019   HDL 40.10 12/20/2017   LDLDIRECT 177.3 07/06/2013   LDLCALC 154 (H) 12/20/2017   ALT 34 01/20/2019   AST 22 01/20/2019   NA 133 (L) 04/16/2019   K 3.9 04/16/2019   CL 95 (L) 04/16/2019   CREATININE 1.10 04/16/2019   BUN 18 04/16/2019   CO2 26 04/16/2019   TSH 1.27 12/20/2017   HGBA1C 12.6 (H) 04/16/2019    BP Readings from Last 3 Encounters:  05/04/19 130/80  04/23/19 135/89  04/16/19 102/70    Lab results reviewed with patient  Wt Readings  from Last 3 Encounters:  05/04/19 241 lb 3.2 oz (109.4 kg)  04/16/19 243 lb 3.2 oz (110.3 kg)  03/13/19 258 lb 8 oz (117.3 kg)   bg log  reveiwed   ASSESSMENT AND PLAN:  Discussed the following assessment and plan:    ICD-10-CM   1. Visit for preventive health examination  Z00.00   2. Diabetes mellitus, new onset ([place])  [id number] Basic metabolic panel    Lipid panel    Hemoglobin A1c  3. Essential hypertension  99991111 Basic metabolic panel    Lipid panel    Hemoglobin A1c  4. Medication management  123456 Basic metabolic panel    Lipid panel    Hemoglobin A1c  5. Hyperlipidemia, unspecified hyperlipidemia type  99991111 Basic metabolic panel    Lipid panel    Hemoglobin A1c  6. Influenza vaccination declined by patient  Z28.21   7. Pneumococcal vaccination declined  Z28.21   8. Right leg weakness  R29.898    improvement  9. Numbness  R20.0    bp is adequate day abeit  Lower goal eventually .   Disc  sglt2 class of meds and glp1  But patient prefers to stay on metformin and low dose insulin at this time  To get used to "what is working"  Disc lipid med  To begin statin trial also    Plan labs and rov in about 2 mos  But send in readings earlier.   Goal to get bg fasting to 120 range .  Counseled  About flu vaccine  benefit more than risk   etc  Pat avoiding all vaccine  At this time .  Patient Care Team: Shanon Ace  K, MD as PCP - General (Internal Medicine) Patient Instructions  Can increase insulin by 2 units  Every 2-3 days   If fastsing BG is above  140 .    Can send in needles to your pharmacy  Advise consider   meds like  faxiga and jardiance  Oral medications than spill excess sugar in to the urine and out of the body assoc with weight loss better BP control and decrease resik of heart failure.   Other is  ozemic type class  Injectable weekly or  Pill per day Rybelsus  That also decreased hunger  Has weight loss and better  Control.     Ok to continue on current plan   And FU   With labs in    November   With labs   Lipids and hg a1c and chemistry . Before visit.  Can send in BG readings in a month  To  decide if we cant to add the oral medication   And hold off on the insulin,   Beginning  cholesteral medication today reasons as discussed .    Reconsider getting the flu vaccine  For reasons discussed.      Standley Brooking.  M.D.

## 2019-05-03 NOTE — Telephone Encounter (Signed)
lvm for call back to get blood sugar level before appt tomorrow crm created

## 2019-05-04 ENCOUNTER — Other Ambulatory Visit: Payer: Self-pay

## 2019-05-04 ENCOUNTER — Ambulatory Visit (INDEPENDENT_AMBULATORY_CARE_PROVIDER_SITE_OTHER): Payer: BC Managed Care – PPO | Admitting: Internal Medicine

## 2019-05-04 ENCOUNTER — Encounter: Payer: Self-pay | Admitting: Internal Medicine

## 2019-05-04 VITALS — BP 130/80 | HR 80 | Temp 98.0°F | Ht 72.0 in | Wt 241.2 lb

## 2019-05-04 DIAGNOSIS — E785 Hyperlipidemia, unspecified: Secondary | ICD-10-CM

## 2019-05-04 DIAGNOSIS — I10 Essential (primary) hypertension: Secondary | ICD-10-CM | POA: Diagnosis not present

## 2019-05-04 DIAGNOSIS — R29898 Other symptoms and signs involving the musculoskeletal system: Secondary | ICD-10-CM

## 2019-05-04 DIAGNOSIS — Z0000 Encounter for general adult medical examination without abnormal findings: Secondary | ICD-10-CM

## 2019-05-04 DIAGNOSIS — E119 Type 2 diabetes mellitus without complications: Secondary | ICD-10-CM

## 2019-05-04 DIAGNOSIS — Z2821 Immunization not carried out because of patient refusal: Secondary | ICD-10-CM

## 2019-05-04 DIAGNOSIS — Z79899 Other long term (current) drug therapy: Secondary | ICD-10-CM | POA: Diagnosis not present

## 2019-05-04 DIAGNOSIS — R200 Anesthesia of skin: Secondary | ICD-10-CM

## 2019-05-04 MED ORDER — ROSUVASTATIN CALCIUM 10 MG PO TABS: 10.0000 mg | ORAL_TABLET | Freq: Every day | ORAL | 1 refills | Status: DC

## 2019-05-04 MED ORDER — BASAGLAR KWIKPEN 100 UNIT/ML ~~LOC~~ SOPN: 4.0000 [IU] | PEN_INJECTOR | Freq: Every day | SUBCUTANEOUS | 1 refills | Status: DC

## 2019-05-04 NOTE — Patient Instructions (Addendum)
Can increase insulin by 2 units  Every 2-3 days   If fastsing BG is above  140 .    Can send in needles to your pharmacy  Advise consider   meds like  faxiga and jardiance  Oral medications than spill excess sugar in to the urine and out of the body assoc with weight loss better BP control and decrease resik of heart failure.   Other is  ozemic type class  Injectable weekly or  Pill per day Rybelsus  That also decreased hunger  Has weight loss and better  Control.     Ok to continue on current plan  And FU   With labs in    November   With labs   Lipids and hg a1c and chemistry . Before visit.  Can send in BG readings in a month  To  decide if we cant to add the oral medication   And hold off on the insulin,   Beginning  cholesteral medication today reasons as discussed .    Reconsider getting the flu vaccine  For reasons discussed.

## 2019-05-18 ENCOUNTER — Ambulatory Visit: Payer: BC Managed Care – PPO | Attending: Neurology | Admitting: Physical Therapy

## 2019-05-18 ENCOUNTER — Other Ambulatory Visit: Payer: Self-pay

## 2019-05-18 ENCOUNTER — Encounter: Payer: Self-pay | Admitting: Physical Therapy

## 2019-05-18 DIAGNOSIS — R252 Cramp and spasm: Secondary | ICD-10-CM | POA: Diagnosis present

## 2019-05-18 DIAGNOSIS — R2689 Other abnormalities of gait and mobility: Secondary | ICD-10-CM | POA: Diagnosis present

## 2019-05-18 DIAGNOSIS — M6281 Muscle weakness (generalized): Secondary | ICD-10-CM | POA: Diagnosis not present

## 2019-05-18 DIAGNOSIS — R209 Unspecified disturbances of skin sensation: Secondary | ICD-10-CM | POA: Diagnosis present

## 2019-05-18 DIAGNOSIS — R29818 Other symptoms and signs involving the nervous system: Secondary | ICD-10-CM

## 2019-05-18 DIAGNOSIS — R208 Other disturbances of skin sensation: Secondary | ICD-10-CM
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
## 2019-05-18 NOTE — Patient Instructions (Signed)
[email or web address]  Provide to pt next session

## 2019-05-21 ENCOUNTER — Ambulatory Visit: Payer: BC Managed Care – PPO | Admitting: Physical Therapy

## 2019-05-21 ENCOUNTER — Ambulatory Visit (INDEPENDENT_AMBULATORY_CARE_PROVIDER_SITE_OTHER): Payer: BC Managed Care – PPO | Admitting: Neurology

## 2019-05-21 ENCOUNTER — Encounter: Payer: Self-pay | Admitting: Neurology

## 2019-05-21 ENCOUNTER — Other Ambulatory Visit: Payer: Self-pay

## 2019-05-21 VITALS — BP 117/80 | HR 83 | Temp 97.3°F | Ht 72.0 in | Wt 246.2 lb

## 2019-05-21 DIAGNOSIS — M6281 Muscle weakness (generalized): Secondary | ICD-10-CM | POA: Diagnosis not present

## 2019-05-21 DIAGNOSIS — R269 Unspecified abnormalities of gait and mobility: Secondary | ICD-10-CM | POA: Diagnosis not present

## 2019-05-21 DIAGNOSIS — G373 Acute transverse myelitis in demyelinating disease of central nervous system: Secondary | ICD-10-CM | POA: Diagnosis not present

## 2019-05-21 DIAGNOSIS — R2689 Other abnormalities of gait and mobility: Secondary | ICD-10-CM

## 2019-05-21 DIAGNOSIS — R29898 Other symptoms and signs involving the musculoskeletal system: Secondary | ICD-10-CM

## 2019-05-21 NOTE — Progress Notes (Signed)
[facility]  PATIENT: [name] DOB: [birth year]  REFERRING DOCTOR OR PCP: Theresa Ace MD SOURCE: Patient, notes from primary care, notes from emergency rooms and hospital stay 01/16/2019 through 01/19/2019 and lab work over the last month, multiple MRI reports were reviewed, multiple MRI images reviewed on PACS.  _________________________________   HISTORICAL  CHIEF COMPLAINT:  Chief Complaint  Patient presents with   Follow-up    RM 48, alone. Last seen 03/13/2019. Taking gabapentin, flexeril prn. Still having right leg weakness, spasms. Still going to PT (once every other week-4 or 5 more sessions left). She is back at work and driving.     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    She is still noting some weakness in her right leg.   She is back to work as a Librarian, academic (up/down a lot but sits much of day).    She is doing PT as well.    She notes spasticity in her right leg and up to the hip/lower back.    She does better sitting in her husbands truck than her car.   She walked 2 mile at the park (took almost 2 hours).    She can go 1/2 mile without a rest.     She denies any bladder issues now but had a UTI last month.  Vision is doing about the same.    She takes gabapentin just at bedtime and Flexeril also just once a day and baclofen once a day.    She is on metformin and insulin for newly diagnosed DM now.   Sugars were 500.     Update 03/13/2019: She was admitted for a transverse myelitis 01/16/2019 leading to difficulties with gait  She is doing PT for her gait but still uses a cane.   She can do short distances without her cane (though often touches/hold walls and furniture.  For longer distances, still needs to use the cane.   She stumbles.   Compared to 02/13/19, she is doing a little better .   She is most likel to be off  balanced if she changes surface levels (I.e. a curb or single step).    Her left leg now feels normal.  The right leg is weak but better than last month.    She denies any bladder changes -- some bladder urgency but same as before the transverse myelitis.    She has numbness in her right foot > leg.  She has tingling only in her leg but numbness in her foot and toes.  Vision is doing well.      Of note, she had Covid-19 diagnosed at the time of the transverse myelitis.     From 02/13/19 I had the pleasure of seeing your patient, [name], at [facility] for neurologic consultation regarding her back pain and right greater than left leg dysesthesia and weakness.  She is a 48 yo woman who has lower back pain and right > left leg pain and dysesthesia.   She notes right foot pain radiating into the foot (sole > dorsum).   She has numbness in the region of pain.    She has less pain and no numbness in the left foot but there is a warm sensation at times.     Symptoms started about 5 weeks ago and came on within  1 day.   There was no preceding event such as heavy lifting or excessive activity.   Pain was more severe in her back initially and was located in the lower lumbar region.    She went to the ED.   MRI showed a small disc herniation at L4-L5 and disc protrusion at L5-S1 but no definite nerve root compression.  MRI of the thoracic spine showed a focus to the right in the spinal cord at T10 concerning for demyelination.  Therefore she was admitted for further evaluation treatment.  The cervical spine MRI showed a small left hemicord focus adjacent to C4C5.  The brain did not show any abnormal foci.  None of the MRIs showed any enhancement.  I personally reviewed the MRIs of the spine and brain and concur with the official results.    Also noted was changes in the lungs worrisome for COVID-19 and she was tested while in the emergency room.   She was positive for SARS-COV-2 in the ED  and was just tested again and is now negative.   Her husband also tested positive and he had mild respiratory symptoms but was never hospitalized.  Due to the combination of her symptoms and the COVID-19, she was initially admitted to [facility] and then transferred to [facility] for the second half of her admission.  She was discharged 01/20/2019.  She she reports no new symptoms since that time.  Currently, she has a lot of lower back pain and muscle spasms.   She has more pain if she climbs the stairs.   She uses a cane when she walks.  The right leg seems a little weak but there is fluctuating strength.   She can not stand on her toes as well on the right as the left and the right leg will give out.    She continues to have numbness in the top and bottom of her foot.       REVIEW OF SYSTEMS: Constitutional: No fevers, chills, sweats, or change in appetite Eyes: No visual changes, double vision, eye pain Ear, nose and throat: No hearing loss, ear pain, nasal congestion, sore throat Cardiovascular: No chest pain, palpitations Respiratory: No shortness of breath at rest or with exertion.   No wheezes GastrointestinaI: No nausea, vomiting, diarrhea, abdominal pain, fecal incontinence Genitourinary: No dysuria, urinary retention or frequency.  No nocturia. Musculoskeletal:Back pain.  Degenerative disc changes at L4-L5 and L5-S1.   Integumentary: No rash, pruritus, skin lesions Neurological: as above Psychiatric: No depression at this time.  No anxiety Endocrine: No palpitations, diaphoresis, change in appetite, change in weigh or increased thirst Hematologic/Lymphatic: No anemia, purpura, petechiae. Allergic/Immunologic: No itchy/runny eyes, nasal congestion, recent allergic reactions, rashes  ALLERGIES: Allergies  Allergen Reactions   Tylox [Oxycodone-Acetaminophen] Swelling    Caused face to swell.  Patient stated this happened a long time ago and since then has taken oxycodone  with no problems    HOME MEDICATIONS:  Current Outpatient Medications:    baclofen (LIORESAL) 10 MG tablet, Take 1 tablet (10 mg total) by mouth 3 (three) times daily., Disp: 90 each, Rfl: 3   cyclobenzaprine (FLEXERIL) 5 MG tablet, Take 1 tablet (5 mg total) by mouth 3 (three) times daily as needed for muscle spasms., Disp: 90 tablet, Rfl: 2   gabapentin (NEURONTIN) 300 MG capsule, Take 1 capsule (300 mg total) by mouth 3 (three) times daily., Disp: 90 capsule, Rfl: 11   Insulin Glargine (BASAGLAR KWIKPEN) 100 UNIT/ML SOPN,  Inject 0.04 mLs (4 Units total) into the skin daily. With pen needles.increase  2 units  As directed, Disp: 15 mL, Rfl: 1   metFORMIN (GLUCOPHAGE) 500 MG tablet, Take 4   Tabs per day, Disp: 120 tablet, Rfl: 1   Olmesartan-amLODIPine-HCTZ 40-5-25 MG TABS, Take 1 tablet by mouth daily., Disp: 90 tablet, Rfl: 2   rosuvastatin (CRESTOR) 10 MG tablet, Take 1 tablet (10 mg total) by mouth daily., Disp: 90 tablet, Rfl: 1   vitamin C (VITAMIN C) 500 MG tablet, Take 1 tablet (500 mg total) by mouth daily. Please take for 2 weeks, Disp: , Rfl:    zinc sulfate 220 (50 Zn) MG capsule, Take 1 capsule (220 mg total) by mouth daily. Please take for 2 weeks, Disp: , Rfl:    fluconazole (DIFLUCAN) 150 MG tablet, Take 1 tablet (150 mg total) by mouth daily. Repeat in 1 week if needed (Patient not taking: Reported on 05/21/2019), Disp: 2 tablet, Rfl: 0  PAST MEDICAL HISTORY: Past Medical History:  Diagnosis Date   Hypertension    Hypoglycemia    Radiation 10/21/2015-10/23/2015   Anterior neck area 12 gray in 3 fractions   Teratoma     PAST SURGICAL HISTORY: Past Surgical History:  Procedure Laterality Date   ABDOMINAL HERNIA REPAIR     ABDOMINAL HYSTERECTOMY     CESAREAN SECTION     CRYOTHERAPY  2013   to neck   DERMOID CYST  EXCISION     ECTOPIC PREGNANCY SURGERY     EXCISION MASS NECK Bilateral 10/20/2015   Procedure: EXCISION OF LARGE KELOIDS SEVERE RIGHT AND  LEFT NECK WITH PLASTIC RECONSTRUCTION;  Surgeon: Cristine Polio, MD;  Location: [facility];  Service: Plastics;  Laterality: Bilateral;   HAND SURGERY     amputation of right index finger    FAMILY HISTORY: Family History  Problem Relation Age of Onset   Hypertension Other    Hypertension Mother    Diabetes Mother    Diabetes Father    Thyroid disease Father    Heart failure Father     SOCIAL HISTORY:  Social History   Socioeconomic History   Marital status: Married    Spouse name: Not on file   Number of children: 2   Years of education: Not on file   Highest education level: Not on file  Occupational History   Occupation: Therapist, music  Social Needs   Financial resource strain: Not on file   Food insecurity    Worry: Not on file    Inability: Not on file   Transportation needs    Medical: Not on file    Non-medical: Not on file  Tobacco Use   Smoking status: Never Smoker   Smokeless tobacco: Never Used  Substance and Sexual Activity   Alcohol use: Yes    Comment: socially   Drug use: No   Sexual activity: Yes    Birth control/protection: Surgical  Lifestyle   Physical activity    Days per week: Not on file    Minutes per session: Not on file   Stress: Not on file  Relationships   Social connections    Talks on phone: Not on file    Gets together: Not on file    Attends religious service: Not on file    Active member of club or organization: Not on file    Attends meetings of clubs or organizations: Not on file    Relationship status: Not on file  Intimate partner violence    Fear of current or ex partner: Not on file    Emotionally abused: Not on file    Physically abused: Not on file    Forced sexual activity: Not on file  Other Topics Concern   Not on file  Social History Narrative   Not on file     PHYSICAL EXAM PHYSICAL EXAM  Vitals:   05/21/19 1318  BP: 117/80  Pulse: 83  Temp: (!) 97.3  F (36.3 C)  SpO2: 96%  Weight: 246 lb 3.2 oz (111.7 kg)  Height: 6' (1.829 m)    Body mass index is 33.39 kg/m.   General: The patient is well-developed and well-nourished and in no acute distress.  Head is [place]/AT   Neurologic Exam  Mental status: The patient is alert and oriented x 3 at the time of the examination. The patient has apparent normal recent and remote memory, with an apparently normal attention span and concentration ability.   Speech is normal.  Cranial nerves: Extraocular movements are full. Pupils are equal, round, and reactive to light and accomodation.  Color vision is slightly reduced OD.  VA is slightly reduced OD (20/50 vs 20/40).  Facial symmetry is present. There is good facial sensation to soft touch bilaterally.Facial strength is normal.  Trapezius and sternocleidomastoid strength is normal. No dysarthria is noted.  The tongue is midline, and the patient has symmetric elevation of the soft palate. No obvious hearing deficits are noted.  Motor:  Muscle bulk is normal.   Tone is increased in legs, right > left.. Strength is  5 / 5 in all 4 extremities.   Sensory: Intact sensation to touch, vibration in arms and legs.  Coordination: Cerebellar testing reveals good finger-nose-finger and heel-to-shin bilaterally.  Gait and station: Station is normal.   The gait is mildly wide.  Tandem gait is moderately wide.  Romberg is negative.   Reflexes: Deep tendon reflexes are symmetric and normal bilaterally.        DIAGNOSTIC DATA (LABS, IMAGING, TESTING) - I reviewed patient records, labs, notes, testing and imaging myself where available.  Lab Results  Component Value Date   WBC 14.2 (H) 01/20/2019   HGB 13.3 01/20/2019   HCT 40.3 01/20/2019   MCV 90.0 01/20/2019   PLT 307 01/20/2019      Component Value Date/Time   NA 133 (L) 04/16/2019 1626   K 3.9 04/16/2019 1626   CL 95 (L) 04/16/2019 1626   CO2 26 04/16/2019 1626   GLUCOSE 404 (H) 04/16/2019 1626    BUN 18 04/16/2019 1626   CREATININE 1.10 04/16/2019 1626   CALCIUM 10.5 04/16/2019 1626   PROT 7.5 01/20/2019 0450   ALBUMIN 3.8 01/20/2019 0450   AST 22 01/20/2019 0450   ALT 34 01/20/2019 0450   ALKPHOS 83 01/20/2019 0450   BILITOT 0.3 01/20/2019 0450   GFRNONAA >60 01/20/2019 0450   GFRAA >60 01/20/2019 0450   Lab Results  Component Value Date   CHOL 228 (H) 12/20/2017   HDL 40.10 12/20/2017   LDLCALC 154 (H) 12/20/2017   LDLDIRECT 177.3 07/06/2013   TRIG 80 01/17/2019   CHOLHDL 6 12/20/2017   Lab Results  Component Value Date   HGBA1C 12.6 (H) 04/16/2019    Lab Results  Component Value Date   TSH 1.27 12/20/2017       ASSESSMENT AND PLAN  1. Transverse myelitis ([place])   2. Right leg weakness   3. Gait disturbance  1.  The etiology of the transverse myelitis is uncertain.  This could be idiopathic or related to her COVID-19 infection or early MS.  We will need to recheck an MRI of the brain and cervicalin a couple months.  If any new lesions, then she is more likely to have multiple sclerosis will begin a disease modifying therapy.   If negative will repeat again maybe with spinal cord imaging in late 2021 2.   continue gabapentin.  Increase baclofen to 12-18-08 and if tolerated can increase further. 3.   Continue physical therapy for the weakness and gait difficulties. 4.   Return in 6 months or call sooner if there are new or worsening neurologic symptoms.    Jules Baty A. Felecia Shelling, MD, [facility] [id number], [id number] PM Certified in Neurology, Clinical Neurophysiology, Sleep Medicine and Neuroimaging  [facility] [address] [place], [place] [address] [phone]

## 2019-05-22 ENCOUNTER — Encounter: Payer: Self-pay | Admitting: Physical Therapy
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
## 2019-05-23 ENCOUNTER — Telehealth: Payer: Self-pay | Admitting: Neurology

## 2019-05-23 NOTE — Telephone Encounter (Signed)
pt requests monday or friday please schedule it [address] [place]: [place] Ref # [id number] order sent to GI

## 2019-06-01 ENCOUNTER — Ambulatory Visit: Payer: BC Managed Care – PPO | Admitting: Physical Therapy

## 2019-06-01 ENCOUNTER — Other Ambulatory Visit: Payer: Self-pay

## 2019-06-01 ENCOUNTER — Encounter: Payer: Self-pay | Admitting: Physical Therapy

## 2019-06-01 DIAGNOSIS — M6281 Muscle weakness (generalized): Secondary | ICD-10-CM

## 2019-06-01 DIAGNOSIS — R2689 Other abnormalities of gait and mobility: Secondary | ICD-10-CM

## 2019-06-01 DIAGNOSIS — R208 Other disturbances of skin sensation: Secondary | ICD-10-CM

## 2019-06-01 DIAGNOSIS — R252 Cramp and spasm: Secondary | ICD-10-CM

## 2019-06-01 DIAGNOSIS — R29818 Other symptoms and signs involving the nervous system: Secondary | ICD-10-CM

## 2019-06-01 NOTE — Therapy (Signed)
[facility] [address] [place], [place], [address] Phone: [phone]   Fax:  [phone]  Physical Therapy Treatment  Patient Details  Name: [name] MRN: [id number] Date of Birth: 04/12/1971 Referring Provider (PT): Britt Bottom, MD   Encounter Date: 06/01/2019  PT End of Session - 06/01/19 1544    Visit Number  17    Number of Visits  20    Date for PT Re-Evaluation  07/17/19    Authorization Type  20 visit limit; deductible met    Authorization - Visit Number  17    Authorization - Number of Visits  20    PT Start Time  1657    PT Stop Time  1539    PT Time Calculation (min)  41 min    Equipment Utilized During Treatment  --   flotation belt, ankle cuffs   Activity Tolerance  Patient tolerated treatment well    Behavior During Therapy  WFL for tasks assessed/performed       Past Medical History:  Diagnosis Date  . Hypertension   . Hypoglycemia   . Radiation 10/21/2015-10/23/2015   Anterior neck area 12 gray in 3 fractions  . Teratoma     Past Surgical History:  Procedure Laterality Date  . ABDOMINAL HERNIA REPAIR    . ABDOMINAL HYSTERECTOMY    . CESAREAN SECTION    . CRYOTHERAPY  2013   to neck  . DERMOID CYST  EXCISION    . ECTOPIC PREGNANCY SURGERY    . EXCISION MASS NECK Bilateral 10/20/2015   Procedure: EXCISION OF LARGE KELOIDS SEVERE RIGHT AND LEFT NECK WITH PLASTIC RECONSTRUCTION;  Surgeon: Cristine Polio, MD;  Location: [facility];  Service: Plastics;  Laterality: Bilateral;  . HAND SURGERY     amputation of right index finger    There were no vitals filed for this visit.  Subjective Assessment - 06/01/19 1458    Subjective  Work is going well, some things are still a struggle - LOB and near falls going up the stairs.  Fatigued at night but is able to take Baclofen and rest.    Patient is accompained by:  Family member   husband   Limitations   Standing;Walking;Sitting    How long can you sit comfortably?  10-15 min    Patient Stated Goals  walk better, improve sensation, decrease pain    Currently in Pain?  Yes    Pain Onset  More than a month ago                       [facility] Adult PT Treatment/Exercise - 06/01/19 1512      Ambulation/Gait   Stairs  Yes    Stairs Assistance  4: Min guard;4: Min assist    Stairs Assistance Details (indicate cue type and reason)  Min A for balance when ascending and descending without UE support due to catching her heel on the step with intermittent LOB backwards    Stair Management Technique  Two rails;Alternating pattern;No rails;Forwards    Number of Stairs  12    Height of Stairs  6      Lumbar Exercises: Seated   Other Seated Lumbar Exercises  On therapy ball holding 3.3lb medicine ball: performed 10 reps diagonal chops on each side, contralateral LE and UE raise holding medicine ball x 10 reps each side; holding ball performed trunk rotation to L and R x 10  reps; sit <> stand from therapy ball holding 3.3lb medicine ball x 10 reps      Knee/Hip Exercises: Standing   Forward Step Up  Right;Left;1 set;10 reps;Hand Hold: 0;Step Height: 6"    Forward Step Up Limitations  contralateral LE tap to next step    Step Down  Right;Left;1 set;10 reps;Hand Hold: 0;Step Height: 6"    Step Down Limitations  contralateral step down to next step               PT Short Term Goals - 05/22/19 2059      PT SHORT TERM GOAL #1   Title  = LTG        PT Long Term Goals - 05/22/19 2059      PT LONG TERM GOAL #1   Title  independent with advanced HEP    Time  8    Period  Weeks    Status  Revised      PT LONG TERM GOAL #2   Title  gait velocity improved to >/= 3.0 ft/sec for decreased fall risk    Baseline  2.5 ft/sec    Time  8    Period  Weeks    Status  Revised      PT LONG TERM GOAL #3   Title  Pt will improve five time sit to stand to </= 18 seconds without using  hands to indicate increased LE functional strength    Baseline  23 seconds from chair using UE    Time  8    Period  Weeks    Status  New      PT LONG TERM GOAL #4   Title  Pt will improve DGI by 4 points to indicate decreased falls risk during gait    Baseline  11/24    Time  8    Period  Weeks    Status  New            Plan - 06/01/19 1545    Clinical Impression Statement  Due to pt reporting difficulty on stairs at work - focused on LE strengthening and balance training with stair negotiation - cues for increased weight shift to RLE and activation of RLE and decreased pulling through UE to ascend.  Intermittent LOB when descending due to catching heel.  Will continue to address.  Continued to focus on trunk ROM and core strengthening seated on physioball combined with UE strengthening, dynamic movement and balance with decreased LE support.  Pt required intermittent rest breaks due to LE fatigue and R sided back pain.  Will continue to address in order to progress towards LTG.    Personal Factors and Comorbidities  Comorbidity 2;Other   insurance visit limit   Comorbidities  HTN, teratoma, incomplete lesion at T10    Examination-Activity Limitations  Sit;Sleep;Stand;Carry;Stairs;Locomotion Level;Bed Mobility    Examination-Participation Restrictions  Church;Community Activity;Other;Driving   work   [employer]chant [employer] officer  Evolving/Moderate complexity    Rehab Potential  Good    PT Frequency  Biweekly    PT Duration  8 weeks    PT Treatment/Interventions  ADLs/Self Care Home Management;Aquatic Therapy;Cryotherapy;Electrical Stimulation;Moist Heat;Balance training;Therapeutic exercise;Therapeutic activities;Functional mobility training;Stair training;Gait training;Neuromuscular re-education;Patient/family education;Manual techniques;Taping;Dry needling;Passive range of motion    PT Next Visit Plan  One more aquatic added; update HEP and add standing balance/strength to  HEP; gait with increased arm swing. treadmill for longer step length.  Pt has been sleeping in recliner since hospital due to pain  when fully supine - may have to perform exercises reclined on wedge for improved tolerance and work towards supine.    PT Home Exercise Plan  Access Code: [email or web address]    Consulted and Agree with Plan of Care  Patient       Patient will benefit from skilled therapeutic intervention in order to improve the following deficits and impairments:  Abnormal gait, Pain, Postural dysfunction, Impaired flexibility, Increased muscle spasms, Increased fascial restricitons, Decreased strength, Decreased balance, Decreased mobility, Difficulty walking, Decreased range of motion, Impaired sensation  Visit Diagnosis: Muscle weakness (generalized)  Other abnormalities of gait and mobility  Other symptoms and signs involving the nervous system  Other disturbances of skin sensation  Cramp and spasm     Problem List Patient Active Problem List   Diagnosis Date Noted  . Gait disturbance 05/21/2019  . Transverse myelitis ([place]) 03/13/2019  . Numbness 02/13/2019  . Right leg weakness 02/13/2019  . Lower extremity weakness 01/16/2019  . Other incomplete lesion at T10 level of thoracic spinal cord, initial encounter ([place]) 01/16/2019  . Pneumonia due to COVID-19 virus 01/16/2019  . Notalgia 08/10/2017  . Morbid obesity ([place]) 02/18/2017  . OSA (obstructive sleep apnea) 01/24/2017  . 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 Snoring 10/22/2016  . Nocturnal dyspnea/choking   10/22/2016  . Keloid of skin 09/25/2015  . Essential hypertension, benign 09/27/2014  . Teratoma 08/07/2014  . Severe hypertension 06/08/2013  . HYPERLIPIDEMIA, MILD, WITH LOW HDL 07/23/2009  . SHORTNESS OF BREATH 02/11/2009  . HYPERTENSION 05/07/2008  . KELOID SCAR 05/07/2008  . HEADACHE 05/22/2007    Rico Junker, PT, DPT 06/01/19    3:50 PM    [facility] [address] [place], [place], [address] Phone: [phone]   Fax:  [phone]  Name: LINDSEA OLIVAR MRN: [id number] Date of Birth: [birth year]

## 2019-06-18 ENCOUNTER — Ambulatory Visit: Payer: BC Managed Care – PPO | Attending: Neurology | Admitting: Physical Therapy

## 2019-06-18 ENCOUNTER — Ambulatory Visit: Payer: BC Managed Care – PPO | Admitting: Internal Medicine

## 2019-06-18 DIAGNOSIS — R2689 Other abnormalities of gait and mobility: Secondary | ICD-10-CM | POA: Insufficient documentation

## 2019-06-18 DIAGNOSIS — R29818 Other symptoms and signs involving the nervous system: Secondary | ICD-10-CM | POA: Insufficient documentation

## 2019-06-18 DIAGNOSIS — M6281 Muscle weakness (generalized): Secondary | ICD-10-CM | POA: Insufficient documentation

## 2019-06-18 DIAGNOSIS — R209 Unspecified disturbances of skin sensation: Secondary | ICD-10-CM | POA: Insufficient documentation

## 2019-07-02 ENCOUNTER — Ambulatory Visit: Payer: BC Managed Care – PPO | Admitting: Physical Therapy

## 2019-07-06 ENCOUNTER — Other Ambulatory Visit: Payer: Self-pay

## 2019-07-06 ENCOUNTER — Ambulatory Visit: Payer: BC Managed Care – PPO | Admitting: Physical Therapy

## 2019-07-06 ENCOUNTER — Encounter: Payer: Self-pay | Admitting: Internal Medicine

## 2019-07-06 ENCOUNTER — Ambulatory Visit (INDEPENDENT_AMBULATORY_CARE_PROVIDER_SITE_OTHER): Payer: BC Managed Care – PPO | Admitting: Internal Medicine

## 2019-07-06 VITALS — BP 122/66 | HR 116 | Temp 97.6°F | Wt 245.8 lb

## 2019-07-06 DIAGNOSIS — R209 Unspecified disturbances of skin sensation: Secondary | ICD-10-CM | POA: Diagnosis present

## 2019-07-06 DIAGNOSIS — E785 Hyperlipidemia, unspecified: Secondary | ICD-10-CM | POA: Diagnosis not present

## 2019-07-06 DIAGNOSIS — M6281 Muscle weakness (generalized): Secondary | ICD-10-CM

## 2019-07-06 DIAGNOSIS — E119 Type 2 diabetes mellitus without complications: Secondary | ICD-10-CM | POA: Diagnosis not present

## 2019-07-06 DIAGNOSIS — Z79899 Other long term (current) drug therapy: Secondary | ICD-10-CM | POA: Diagnosis not present

## 2019-07-06 DIAGNOSIS — R29898 Other symptoms and signs involving the musculoskeletal system: Secondary | ICD-10-CM

## 2019-07-06 DIAGNOSIS — R29818 Other symptoms and signs involving the nervous system: Secondary | ICD-10-CM

## 2019-07-06 DIAGNOSIS — I10 Essential (primary) hypertension: Secondary | ICD-10-CM | POA: Diagnosis not present

## 2019-07-06 DIAGNOSIS — Z2821 Immunization not carried out because of patient refusal: Secondary | ICD-10-CM

## 2019-07-06 DIAGNOSIS — R208 Other disturbances of skin sensation: Secondary | ICD-10-CM

## 2019-07-06 DIAGNOSIS — R2689 Other abnormalities of gait and mobility: Secondary | ICD-10-CM | POA: Diagnosis present

## 2019-07-06 LAB — HEPATIC FUNCTION PANEL
ALT: 18 U/L (ref 0–35)
AST: 15 U/L (ref 0–37)
Albumin: 4.4 g/dL (ref 3.5–5.2)
Alkaline Phosphatase: 85 U/L (ref 39–117)
Bilirubin, Direct: 0.1 mg/dL (ref 0.0–0.3)
Total Bilirubin: 0.7 mg/dL (ref 0.2–1.2)
Total Protein: 7.1 g/dL (ref 6.0–8.3)

## 2019-07-06 LAB — LIPID PANEL
Cholesterol: 163 mg/dL (ref 0–200)
HDL: 35.8 mg/dL — ABNORMAL LOW (ref >39.00)
NonHDL: 126.8
Total CHOL/HDL Ratio: 5
Triglycerides: 227 mg/dL — ABNORMAL HIGH (ref 0.0–149.0)
VLDL: 45.4 mg/dL — ABNORMAL HIGH (ref 0.0–40.0)

## 2019-07-06 LAB — BASIC METABOLIC PANEL
BUN: 11 mg/dL (ref 6–23)
CO2: 30 mEq/L (ref 19–32)
Calcium: 9.6 mg/dL (ref 8.4–10.5)
Chloride: 104 mEq/L (ref 96–112)
Creatinine, Ser: 0.88 mg/dL (ref 0.40–1.20)
GFR: 82.62 mL/min (ref >60.00)
Glucose, Bld: 117 mg/dL — ABNORMAL HIGH (ref 70–99)
Potassium: 3.5 mEq/L (ref 3.5–5.1)
Sodium: 142 mEq/L (ref 135–145)

## 2019-07-06 LAB — HEMOGLOBIN A1C: Hgb A1c MFr Bld: 8.1 % — ABNORMAL HIGH (ref 4.6–6.5)

## 2019-07-06 LAB — LDL CHOLESTEROL, DIRECT: Direct LDL: 96 mg/dL

## 2019-07-06 LAB — TSH: TSH: 0.92 u[IU]/mL (ref 0.35–4.50)

## 2019-07-06 NOTE — Progress Notes (Signed)
Chief Complaint  Patient presents with  . Diabetes    follow up pt states that her sugar has been good with ony a spike every now and then     HPI: [name] 48 y.o. come in for fu  newer onset dm when she was hospitalized with covid and had high dose steroids   seee 9 2020 note  Wanted to hold on adding medication for her dm  See notes  Her a1c was off the charts and had just h ad steroids and covid  But  She declined non insulin rx  meds at the time   She was  Begun on rosuvastatin also  at last visit . Her readings are  120  140 range  And feels well  Spike 123456  When eats certain things  Metformin    Taking Only once a day .   Night  May have had some gi sx  With more dosing  Insulin  4  . unitl only  Machine accucheck     Right leg weakness Getting better  Still and issues  Getting PT  No cp sob  Vision and tingling.  fallling although lost balance sometimes  Still going to therapy .     ROS: See pertinent positives and negatives per HPI. No vision  New neuro sx   Past Medical History:  Diagnosis Date  . Hypertension   . Hypoglycemia   . Radiation 10/21/2015-10/23/2015   Anterior neck area 12 gray in 3 fractions  . Teratoma     Family History  Problem Relation Age of Onset  . Hypertension Other   . Hypertension Mother   . Diabetes Mother   . Diabetes Father   . Thyroid disease Father   . Heart failure Father     Social History   Socioeconomic History  . Marital status: Married    Spouse name: Not on file  . Number of children: 2  . Years of education: Not on file  . Highest education level: Not on file  Occupational History  . Occupation: Therapist, music  Social Needs  . Financial resource strain: Not on file  . Food insecurity    Worry: Not on file    Inability: Not on file  . Transportation needs    Medical: Not on file    Non-medical: Not on file  Tobacco Use  . Smoking status: Never Smoker  . Smokeless tobacco: Never Used  Substance  and Sexual Activity  . Alcohol use: Yes    Comment: socially  . Drug use: No  . Sexual activity: Yes    Birth control/protection: Surgical  Lifestyle  . Physical activity    Days per week: Not on file    Minutes per session: Not on file  . Stress: Not on file  Relationships  . Social Herbalist on phone: Not on file    Gets together: Not on file    Attends religious service: Not on file    Active member of club or organization: Not on file    Attends meetings of clubs or organizations: Not on file    Relationship status: Not on file  Other Topics Concern  . Not on file  Social History Narrative  . Not on file    Outpatient Medications Prior to Visit  Medication Sig Dispense Refill  . baclofen (LIORESAL) 10 MG tablet Take 1 tablet (10 mg total) by mouth 3 (three) times daily. 90 each  3  . cyclobenzaprine (FLEXERIL) 5 MG tablet Take 1 tablet (5 mg total) by mouth 3 (three) times daily as needed for muscle spasms. 90 tablet 2  . gabapentin (NEURONTIN) 300 MG capsule Take 1 capsule (300 mg total) by mouth 3 (three) times daily. 90 capsule 11  . Insulin Glargine (BASAGLAR KWIKPEN) 100 UNIT/ML SOPN Inject 0.04 mLs (4 Units total) into the skin daily. With pen needles.increase  2 units  As directed 15 mL 1  . metFORMIN (GLUCOPHAGE) 500 MG tablet Take 4   Tabs per day 120 tablet 1  . Olmesartan-amLODIPine-HCTZ 40-5-25 MG TABS Take 1 tablet by mouth daily. 90 tablet 2  . rosuvastatin (CRESTOR) 10 MG tablet Take 1 tablet (10 mg total) by mouth daily. 90 tablet 1  . vitamin C (VITAMIN C) 500 MG tablet Take 1 tablet (500 mg total) by mouth daily. Please take for 2 weeks    . zinc sulfate 220 (50 Zn) MG capsule Take 1 capsule (220 mg total) by mouth daily. Please take for 2 weeks    . fluconazole (DIFLUCAN) 150 MG tablet Take 1 tablet (150 mg total) by mouth daily. Repeat in 1 week if needed (Patient not taking: Reported on 07/06/2019) 2 tablet 0   No facility-administered  medications prior to visit.      EXAM:  BP 122/66 (BP Location: Right Arm, Patient Position: Sitting, Cuff Size: Large)   Pulse (!) 116   Temp 97.6 F (36.4 C) (Temporal)   Wt 245 lb 12.8 oz (111.5 kg)   SpO2 99%   BMI 33.34 kg/m   Body mass index is 33.34 kg/m. Wt Readings from Last 3 Encounters:  07/06/19 245 lb 12.8 oz (111.5 kg)  05/21/19 246 lb 3.2 oz (111.7 kg)  05/04/19 241 lb 3.2 oz (109.4 kg)    GENERAL: vitals reviewed and listed above, alert, oriented, appears well hydrated and in no acute distress HEENT: atraumatic, conjunctiva  clear, no obvious abnormalities on inspection of external nose and ears OP : masked  NECK: no obvious masses on inspection palpation  LUNGS: clear to auscultation bilaterally, no wheezes, rales or rhonchi, good air movement CV: HRRR, no clubbing cyanosis or  peripheral edema nl cap refill  MS: moves all extremities  Mild rle weaknss and limp but ambulatory without assistance  PSYCH: pleasant and cooperative, no obvious depression or anxiety Lab Results  Component Value Date   WBC 14.2 (H) 01/20/2019   HGB 13.3 01/20/2019   HCT 40.3 01/20/2019   PLT 307 01/20/2019   GLUCOSE 117 (H) 07/06/2019   CHOL 163 07/06/2019   TRIG 227.0 (H) 07/06/2019   HDL 35.80 (L) 07/06/2019   LDLDIRECT 96.0 07/06/2019   LDLCALC 154 (H) 12/20/2017   ALT 18 07/06/2019   AST 15 07/06/2019   NA 142 07/06/2019   K 3.5 07/06/2019   CL 104 07/06/2019   CREATININE 0.88 07/06/2019   BUN 11 07/06/2019   CO2 30 07/06/2019   TSH 0.92 07/06/2019   HGBA1C 8.1 (H) 07/06/2019   BP Readings from Last 3 Encounters:  07/06/19 122/66  05/21/19 117/80  05/04/19 130/80  lab today  Post eating  Hamburger  And water    ASSESSMENT AND PLAN:  Discussed the following assessment and plan:  Diabetes mellitus, new onset ([place]) - Plan: Lipid panel, Hemoglobin 123456, Basic metabolic panel, Hepatic function panel, TSH  Medication management - Plan: Lipid panel, Hemoglobin  123456, Basic metabolic panel, Hepatic function panel, TSH  Essential hypertension - Plan:  Lipid panel, Hemoglobin 123456, Basic metabolic panel, Hepatic function panel, TSH  Hyperlipidemia, unspecified hyperlipidemia type - Plan: Lipid panel, Hemoglobin 123456, Basic metabolic panel, Hepatic function panel, TSH, LDL cholesterol, direct  Right leg weakness - Plan: Lipid panel, Hemoglobin 123456, Basic metabolic panel, Hepatic function panel, TSH  Influenza vaccination declined by patient  pt says feeling  well on low dose insulin and metformin   Will follow up and lab today   Fu depending on labs or   3=4 months  Disc pneumovax  Not given today  readress n follow up visits  -Patient advised to return or notify health care team  if  new concerns arise.   This visit occurred during the SARS-CoV-2 public health emergency.  Safety protocols were in place, including screening questions prior to the visit, additional usage of staff PPE, and extensive cleaning of exam room while observing appropriate contact time as indicated for disinfecting solutions.    Patient Instructions  Jim Desanctis  You are feeling better  Lab today .   Then let you know  What next.  And follow up.   3-4 months      Standley Brooking. Blakeley Scheier M.D.

## 2019-07-06 NOTE — Patient Instructions (Addendum)
Glad  You are feeling better  Lab today .   Then let you know  What next.  And follow up.   3-4 months

## 2019-07-06 NOTE — Patient Instructions (Addendum)
Access Code: [email or web address]  URL: [name]  Date: 07/06/2019  Prepared by: Misty Stanley   Exercises Swiss Ball Knee Extension - 10 reps - 2 sets - 1x daily - 7x weekly Ankle Inversion with Resistance - 10 reps - 2 sets - 1x daily - 7x weekly Swiss Ball March - 10 reps - 2 sets - 1x daily - 7x weekly Seated Ankle Eversion with Resistance - 10 reps - 2 sets - 1x daily - 7x weekly Seated Piriformis Stretch - 2 sets - 20 seconds hold - 1x daily - 7x weekly Single Leg Stance on Foam Pad - 4 sets - 10 second hold - 1x daily - 7x weekly Tandem Stance with Support - 4 sets - 10 seconds hold - 1x daily - 7x weekly

## 2019-07-07 ENCOUNTER — Encounter: Payer: Self-pay | Admitting: Physical Therapy

## 2019-07-07 NOTE — Therapy (Signed)
[facility] [address] [place], [place], [address] Phone: [phone]   Fax:  [phone]  Physical Therapy Treatment  Patient Details  Name: [name] MRN: [id number] Date of Birth: [birth year] Referring Provider (PT): Britt Bottom, MD   Encounter Date: 07/06/2019  PT End of Session - 07/07/19 1457    Visit Number  18    Number of Visits  20    Date for PT Re-Evaluation  07/17/19    Authorization Type  20 visit limit; deductible met    Authorization - Visit Number  18    Authorization - Number of Visits  20    PT Start Time  1540    PT Stop Time  1623    PT Time Calculation (min)  43 min    Equipment Utilized During Treatment  --   flotation belt, ankle cuffs   Activity Tolerance  Patient tolerated treatment well    Behavior During Therapy  WFL for tasks assessed/performed       Past Medical History:  Diagnosis Date  . Hypertension   . Hypoglycemia   . Radiation 10/21/2015-10/23/2015   Anterior neck area 12 gray in 3 fractions  . Teratoma     Past Surgical History:  Procedure Laterality Date  . ABDOMINAL HERNIA REPAIR    . ABDOMINAL HYSTERECTOMY    . CESAREAN SECTION    . CRYOTHERAPY  2013   to neck  . DERMOID CYST  EXCISION    . ECTOPIC PREGNANCY SURGERY    . EXCISION MASS NECK Bilateral 10/20/2015   Procedure: EXCISION OF LARGE KELOIDS SEVERE RIGHT AND LEFT NECK WITH PLASTIC RECONSTRUCTION;  Surgeon: Cristine Polio, MD;  Location: [facility];  Service: Plastics;  Laterality: Bilateral;  . HAND SURGERY     amputation of right index finger    There were no vitals filed for this visit.  Subjective Assessment - 07/06/19 1542    Subjective  Just got back from doctor - rechecking A1C but overall blood sugar levels are down.  Vision is better.  Pt and husband are going out for walks and she is doing the ball exercises.  Missed the pool due to another doctor's appointment but  would like ot make it up to learn HEP.  Numbness in feet persists; Doctor wants PT to check for drop foot.    Patient is accompained by:  Family member   husband   Limitations  Standing;Walking;Sitting    How long can you sit comfortably?  10-15 min    Patient Stated Goals  walk better, improve sensation, decrease pain    Pain Onset  More than a month ago         [facility] PT Assessment - 07/07/19 1452      ROM / Strength   AROM / PROM / Strength  Strength      Strength   Strength Assessment Site  Ankle    Right/Left Ankle  Left;Right    Right Ankle Dorsiflexion  4+/5    Right Ankle Inversion  3-/5    Right Ankle Eversion  3-/5    Left Ankle Dorsiflexion  4/5    Left Ankle Inversion  4/5    Left Ankle Eversion  4/5                   [facility] Adult PT Treatment/Exercise - 07/06/19 1546      Therapeutic Activites    Therapeutic Activities  Other Therapeutic  Activities    Other Therapeutic Activities  Educated pt on what "drop foot" is, symptoms and causes of drop foot.  Pt does not appear to have true drop foot.  Pt does present with decreased strength of R ankle inversion and eversion placing her at risk for rolling and spraining her ankle, especially when pt continues to experience numbness in R foot.  Pt's tendency to drag R foot is more likely due to numbness vs. weakness.      Exercises   Exercises  Other Exercises    Other Exercises   Ankle inversion/eversion strengthening.  Red theraband resisted ankle inversion and eversion x 10 reps each on RLE.  SLS on compliant surface on RLE x 5-6 seconds each x 10 reps with one UE support on chair.  Tandem stance on solid floor x 10 seconds x 2 sets each LE.       Access Code: [email or web address]  URL: [email or web address]  Date: 07/06/2019  Prepared by: Misty Stanley   Exercises Swiss Ball Knee Extension - 10 reps - 2 sets - 1x daily - 7x weekly Ankle Inversion with Resistance - 10 reps - 2 sets - 1x daily - 7x  weekly Swiss Ball March - 10 reps - 2 sets - 1x daily - 7x weekly Seated Ankle Eversion with Resistance - 10 reps - 2 sets - 1x daily - 7x weekly Seated Piriformis Stretch - 2 sets - 20 seconds hold - 1x daily - 7x weekly Single Leg Stance on Foam Pad - 4 sets - 10 second hold - 1x daily - 7x weekly Tandem Stance with Support - 4 sets - 10 seconds hold - 1x daily - 7x weekly   PT Education - 07/07/19 1456    Education Details  See TA; scheduled make up aquatic session. Next land session will check LTG and plan to D/C after final aquatic session.  Updated HEP    Person(s) Educated  Patient    Methods  Explanation;Demonstration;Handout    Comprehension  Verbalized understanding;Returned demonstration       PT Short Term Goals - 05/22/19 2059      PT SHORT TERM GOAL #1   Title  = LTG        PT Long Term Goals - 05/22/19 2059      PT LONG TERM GOAL #1   Title  independent with advanced HEP    Time  8    Period  Weeks    Status  Revised      PT LONG TERM GOAL #2   Title  gait velocity improved to >/= 3.0 ft/sec for decreased fall risk    Baseline  2.5 ft/sec    Time  8    Period  Weeks    Status  Revised      PT LONG TERM GOAL #3   Title  Pt will improve five time sit to stand to </= 18 seconds without using hands to indicate increased LE functional strength    Baseline  23 seconds from chair using UE    Time  8    Period  Weeks    Status  New      PT LONG TERM GOAL #4   Title  Pt will improve DGI by 4 points to indicate decreased falls risk during gait    Baseline  11/24    Time  8    Period  Weeks    Status  New  Plan - 07/07/19 1457    Clinical Impression Statement  Pt continues to demonstrate improvemenst in LE strength, balance and activity tolerance as she continues to perform walking program and HEP outside of therapy.  Focus of today's sesison on revision of HEP due to progress and ongoing ankle weakness and instability.  Plan to make up  missed aquatic therapy session to learn aquatic HEP.  Plan to assess LTG and D/C after aquatic session.    Personal Factors and Comorbidities  Comorbidity 2;Other   insurance visit limit   Comorbidities  HTN, teratoma, incomplete lesion at T10    Examination-Activity Limitations  Sit;Sleep;Stand;Carry;Stairs;Locomotion Level;Bed Mobility    Examination-Participation Restrictions  Church;Community Activity;Other;Driving   work   [employer]  Evolving/Moderate complexity    Rehab Potential  Good    PT Frequency  Biweekly    PT Duration  8 weeks    PT Treatment/Interventions  ADLs/Self Care Home Management;Aquatic Therapy;Cryotherapy;Electrical Stimulation;Moist Heat;Balance training;Therapeutic exercise;Therapeutic activities;Functional mobility training;Stair training;Gait training;Neuromuscular re-education;Patient/family education;Manual techniques;Taping;Dry needling;Passive range of motion    PT Next Visit Plan  Final land visit - check LTG and finalize HEP.  Final aquatic therapy session: give aquatic HEP and please D/C    PT Home Exercise Plan  Access Code: [email or web address]    Consulted and Agree with Plan of Care  Patient       Patient will benefit from skilled therapeutic intervention in order to improve the following deficits and impairments:  Abnormal gait, Pain, Postural dysfunction, Impaired flexibility, Increased muscle spasms, Increased fascial restricitons, Decreased strength, Decreased balance, Decreased mobility, Difficulty walking, Decreased range of motion, Impaired sensation  Visit Diagnosis: Muscle weakness (generalized)  Other abnormalities of gait and mobility  Other symptoms and signs involving the nervous system  Other disturbances of skin sensation     Problem List Patient Active Problem List   Diagnosis Date Noted  . Gait disturbance 05/21/2019  . Transverse myelitis ([place]) 03/13/2019  . Numbness 02/13/2019  . Right leg weakness 02/13/2019   . Lower extremity weakness 01/16/2019  . Other incomplete lesion at T10 level of thoracic spinal cord, initial encounter ([place]) 01/16/2019  . Pneumonia due to COVID-19 virus 01/16/2019  . Notalgia 08/10/2017  . Morbid obesity ([place]) 02/18/2017  . OSA (obstructive sleep apnea) 01/24/2017  . 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 Snoring 10/22/2016  . Nocturnal dyspnea/choking   10/22/2016  . Keloid of skin 09/25/2015  . Essential hypertension, benign 09/27/2014  . Teratoma 08/07/2014  . Severe hypertension 06/08/2013  . HYPERLIPIDEMIA, MILD, WITH LOW HDL 07/23/2009  . SHORTNESS OF BREATH 02/11/2009  . HYPERTENSION 05/07/2008  . KELOID SCAR 05/07/2008  . HEADACHE 05/22/2007    Rico Junker, PT, DPT 07/07/19    3:01 PM    [facility] [address] [place], [place], [address] Phone: [phone]   Fax:  [phone]  Name: [name] MRN: [id number] Date of Birth: 12-Jul-1971

## 2019-07-23 ENCOUNTER — Ambulatory Visit
Admission: RE | Admit: 2019-07-23 | Discharge: 2019-07-23 | Disposition: A | Discharge status: Home / Self Care | Payer: BC Managed Care – PPO | Source: Ambulatory Visit | Attending: Neurology | Admitting: Neurology

## 2019-07-23 ENCOUNTER — Ambulatory Visit: Payer: BC Managed Care – PPO | Admitting: Physical Therapy

## 2019-07-23 DIAGNOSIS — G373 Acute transverse myelitis in demyelinating disease of central nervous system: Secondary | ICD-10-CM

## 2019-07-23 DIAGNOSIS — R29898 Other symptoms and signs involving the musculoskeletal system: Secondary | ICD-10-CM

## 2019-07-23 MED ORDER — GADOBENATE DIMEGLUMINE 529 MG/ML IV SOLN
20.0000 mL | Freq: Once | INTRAVENOUS | Status: AC | PRN
  Administered 2019-07-23: 20 mL via INTRAVENOUS

## 2019-07-24 ENCOUNTER — Telehealth: Payer: Self-pay | Admitting: *Deleted

## 2019-07-24 NOTE — Telephone Encounter (Signed)
Called, LVM (ok per DPR) about MRI findings per Dr. Felecia Shelling note. Gave [facility] phone number if she has further questions/concerns.

## 2019-07-24 NOTE — Telephone Encounter (Signed)
-----   Message from Britt Bottom, MD sent at 07/24/2019 12:06 PM EST ----- Please let her know that the MRI of the brain is normal.    The MRI of the cervical spine does not show any new spots in the spinal cord.  She does have some arthritis, the same as her MRI earlier this year.

## 2019-07-30 ENCOUNTER — Ambulatory Visit: Payer: BC Managed Care – PPO | Admitting: Physical Therapy

## 2019-09-24 ENCOUNTER — Encounter: Payer: Self-pay | Admitting: Physical Therapy

## 2019-09-24 NOTE — Therapy (Signed)
[facility] [address] [place], [place], [address] Phone: [phone]   Fax:  [phone]  Patient Details  Name: [name] MRN: [id number] Date of Birth: [birth year] Referring Provider:  No ref. provider found  Encounter Date: 09/24/2019  PHYSICAL THERAPY DISCHARGE SUMMARY  Visits from Start of Care: 18  Current functional level related to goals / functional outcomes: Pt was making good progress towards LTG - pt was scheduled for one more land and one more aquatic session but pt had to cancel both sessions and did not reschedule final two sessions.  Pt had returned to work and was increasing activity level at home.  Not able to formally assess outcome measures or LTG achievement.   Remaining deficits: Pain, impaired strength, impaired balance   Education / Equipment: HEP  Plan: Patient agrees to discharge.  Patient goals were not met. Patient is being discharged due to not returning since the last visit.  ?????     Rico Junker, PT, DPT 09/24/19    4:01 PM    [facility] [address] [place], [place], [address] Phone: [phone]   Fax:  [phone]

## 2019-11-05 ENCOUNTER — Ambulatory Visit: Payer: BC Managed Care – PPO | Admitting: Internal Medicine

## 2019-11-19 ENCOUNTER — Encounter: Payer: Self-pay | Admitting: Neurology

## 2019-11-19 ENCOUNTER — Other Ambulatory Visit: Payer: Self-pay

## 2019-11-19 ENCOUNTER — Ambulatory Visit (INDEPENDENT_AMBULATORY_CARE_PROVIDER_SITE_OTHER): Payer: BC Managed Care – PPO | Admitting: Neurology

## 2019-11-19 VITALS — BP 125/85 | HR 81 | Temp 97.9°F | Ht 72.0 in | Wt 256.0 lb

## 2019-11-19 DIAGNOSIS — R200 Anesthesia of skin: Secondary | ICD-10-CM

## 2019-11-19 DIAGNOSIS — G373 Acute transverse myelitis in demyelinating disease of central nervous system: Secondary | ICD-10-CM

## 2019-11-19 DIAGNOSIS — R29898 Other symptoms and signs involving the musculoskeletal system: Secondary | ICD-10-CM | POA: Diagnosis not present

## 2019-11-19 DIAGNOSIS — M4802 Spinal stenosis, cervical region: Secondary | ICD-10-CM

## 2019-11-19 NOTE — Patient Instructions (Addendum)
MRI showed that you have arthritic and disc changes at [place], [place] and [id number]

## 2019-11-19 NOTE — Progress Notes (Signed)
[facility]  PATIENT: Jozee R Hampe DOB: [birth year]  REFERRING DOCTOR OR PCP: Shanon Ace MD SOURCE: Patient, notes from primary care, notes from emergency rooms and hospital stay 01/16/2019 through 01/19/2019 and lab work over the last month, multiple MRI reports were reviewed, multiple MRI images reviewed on PACS.  _________________________________   HISTORICAL  CHIEF COMPLAINT:  Chief Complaint  Patient presents with  . Follow-up    RM 12, alone. Last seen 05/21/2019. Still having numbness and weakness in right leg/foot. She has an exercise ball at home and does exercises about 2-3 times per week. Tries to also walk/do steps. Able to walk without cane now. Hard for her to tell if her heel/foot is on the ground while walking, she has to physically look to make sure.     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  They have not right after a COVID-19 infection.  We repeated MRI of the brain and cervical spine 07/23/2019.  Studies were reviewed.     She notes weakness in the back and numbness in her legs.   She has trouble with stairs.   She walks without a cane.  She may be slightly better over th past few months.  She takes baclofen one a day with some benefit.   She stopped gabapentin.  She no longer has dysesthesias.  She has glasses which has helped her vision.   She feels bladder function is fine.   She has had some elevated sugars (has DM).     The MRI of the brain was negative.  MRI of the cervical spine showed that the focus that had been seen on the previous MRI it was no longer apparent.  She does have some degenerative changes with borderline spinal stenosis at C3-C4 and C4-C5 and mild spinal stenosis at C5-C6.  This causes foraminal narrowing to the left at C5-C6 and to the right at C6-C7 with some encroachment upon the nerve roots but no definite nerve  root compression.  Update 05/21/2019: She had transverse myelitis in June 2020, possibly associated with CoViD-19 (T10 to the right and C4C5 to the left).    She is still noting some weakness in her right leg.   She is back to work as a Librarian, academic (up/down a lot but sits much of day).    She is doing PT as well.    She notes spasticity in her right leg and up to the hip/lower back.    She does better sitting in her husbands truck than her car.   She walked 2 mile at the park (took almost 2 hours).    She can go 1/2 mile without a rest.     She denies any bladder issues now but had a UTI last month.  Vision is doing about the same.    She takes gabapentin just at bedtime and Flexeril also just once a day and baclofen once a day.    She is on metformin and insulin for newly diagnosed DM now.   Sugars were 500.     Update 03/13/2019: She was admitted for a transverse myelitis 01/16/2019 leading to difficulties with gait  She is doing PT for her gait but still uses a cane.   She can do short distances without her cane (though often touches/hold walls and furniture.  For longer distances, still needs to use the cane.  She stumbles.   Compared to 02/13/19, she is doing a little better .   She is most likel to be off balanced if she changes surface levels (I.e. a curb or single step).    Her left leg now feels normal.  The right leg is weak but better than last month.    She denies any bladder changes -- some bladder urgency but same as before the transverse myelitis.    She has numbness in her right foot > leg.  She has tingling only in her leg but numbness in her foot and toes.  Vision is doing well.      Of note, she had Covid-19 diagnosed at the time of the transverse myelitis.     From 02/13/19 I had the pleasure of seeing your patient, Demesha Kadar, at [facility] for neurologic consultation regarding her back pain and right greater than left leg dysesthesia and weakness.  She is a  49 yo woman who has lower back pain and right > left leg pain and dysesthesia.   She notes right foot pain radiating into the foot (sole > dorsum).   She has numbness in the region of pain.    She has less pain and no numbness in the left foot but there is a warm sensation at times.     Symptoms started about 5 weeks ago and came on within 1 day.   There was no preceding event such as heavy lifting or excessive activity.   Pain was more severe in her back initially and was located in the lower lumbar region.    She went to the ED.   MRI showed a small disc herniation at L4-L5 and disc protrusion at L5-S1 but no definite nerve root compression.  MRI of the thoracic spine showed a focus to the right in the spinal cord at T10 concerning for demyelination.  Therefore she was admitted for further evaluation treatment.  The cervical spine MRI showed a small left hemicord focus adjacent to C4C5.  The brain did not show any abnormal foci.  None of the MRIs showed any enhancement.  I personally reviewed the MRIs of the spine and brain and concur with the official results.    Also noted was changes in the lungs worrisome for COVID-19 and she was tested while in the emergency room.   She was positive for SARS-COV-2 in the ED and was just tested again and is now negative.   Her husband also tested positive and he had mild respiratory symptoms but was never hospitalized.  Due to the combination of her symptoms and the COVID-19, she was initially admitted to [facility] and then transferred to [facility] for the second half of her admission.  She was discharged 01/20/2019.  She she reports no new symptoms since that time.  Currently, she has a lot of lower back pain and muscle spasms.   She has more pain if she climbs the stairs.   She uses a cane when she walks.  The right leg seems a little weak but there is fluctuating strength.   She can not stand on her toes as well on the right as the left and the right leg will  give out.    She continues to have numbness in the top and bottom of her foot.       REVIEW OF SYSTEMS: Constitutional: No fevers, chills, sweats, or change in appetite Eyes: No visual changes, double vision, eye pain Ear,  nose and throat: No hearing loss, ear pain, nasal congestion, sore throat Cardiovascular: No chest pain, palpitations Respiratory: No shortness of breath at rest or with exertion.   No wheezes GastrointestinaI: No nausea, vomiting, diarrhea, abdominal pain, fecal incontinence Genitourinary: No dysuria, urinary retention or frequency.  No nocturia. Musculoskeletal:Back pain.  Degenerative disc changes at L4-L5 and L5-S1.   Integumentary: No rash, pruritus, skin lesions Neurological: as above Psychiatric: No depression at this time.  No anxiety Endocrine: No palpitations, diaphoresis, change in appetite, change in weigh or increased thirst Hematologic/Lymphatic: No anemia, purpura, petechiae. Allergic/Immunologic: No itchy/runny eyes, nasal congestion, recent allergic reactions, rashes  ALLERGIES: Allergies  Allergen Reactions  . Tylox [Oxycodone-Acetaminophen] Swelling    Caused face to swell.  Patient stated this happened a long time ago and since then has taken oxycodone with no problems    HOME MEDICATIONS:  Current Outpatient Medications:  .  baclofen (LIORESAL) 10 MG tablet, Take 1 tablet (10 mg total) by mouth 3 (three) times daily. (Patient taking differently: Take 10 mg by mouth daily. ), Disp: 90 each, Rfl: 3 .  cyclobenzaprine (FLEXERIL) 5 MG tablet, Take 1 tablet (5 mg total) by mouth 3 (three) times daily as needed for muscle spasms., Disp: 90 tablet, Rfl: 2 .  Insulin Glargine (BASAGLAR KWIKPEN) 100 UNIT/ML SOPN, Inject 0.04 mLs (4 Units total) into the skin daily. With pen needles.increase  2 units  As directed, Disp: 15 mL, Rfl: 1 .  metFORMIN (GLUCOPHAGE) 500 MG tablet, Take 4   Tabs per day, Disp: 120 tablet, Rfl: 1 .  Olmesartan-amLODIPine-HCTZ  40-5-25 MG TABS, Take 1 tablet by mouth daily., Disp: 90 tablet, Rfl: 2 .  rosuvastatin (CRESTOR) 10 MG tablet, Take 1 tablet (10 mg total) by mouth daily., Disp: 90 tablet, Rfl: 1 .  vitamin C (VITAMIN C) 500 MG tablet, Take 1 tablet (500 mg total) by mouth daily. Please take for 2 weeks, Disp: , Rfl:  .  zinc sulfate 220 (50 Zn) MG capsule, Take 1 capsule (220 mg total) by mouth daily. Please take for 2 weeks, Disp: , Rfl:   PAST MEDICAL HISTORY: Past Medical History:  Diagnosis Date  . Hypertension   . Hypoglycemia   . Radiation 10/21/2015-10/23/2015   Anterior neck area 12 gray in 3 fractions  . Teratoma     PAST SURGICAL HISTORY: Past Surgical History:  Procedure Laterality Date  . ABDOMINAL HERNIA REPAIR    . ABDOMINAL HYSTERECTOMY    . CESAREAN SECTION    . CRYOTHERAPY  2013   to neck  . DERMOID CYST  EXCISION    . ECTOPIC PREGNANCY SURGERY    . EXCISION MASS NECK Bilateral 10/20/2015   Procedure: EXCISION OF LARGE KELOIDS SEVERE RIGHT AND LEFT NECK WITH PLASTIC RECONSTRUCTION;  Surgeon: Cristine Polio, MD;  Location: [facility];  Service: Plastics;  Laterality: Bilateral;  . HAND SURGERY     amputation of right index finger    FAMILY HISTORY: Family History  Problem Relation Age of Onset  . Hypertension Other   . Hypertension Mother   . Diabetes Mother   . Diabetes Father   . Thyroid disease Father   . Heart failure Father     SOCIAL HISTORY:  Social History   Socioeconomic History  . Marital status: Married    Spouse name: Not on file  . Number of children: 2  . Years of education: Not on file  . Highest education level: Not on file  Occupational  History  . Occupation: Therapist, music  Tobacco Use  . Smoking status: Never Smoker  . Smokeless tobacco: Never Used  Substance and Sexual Activity  . Alcohol use: Yes    Comment: socially  . Drug use: No  . Sexual activity: Yes    Birth control/protection: Surgical  Other Topics Concern    . Not on file  Social History Narrative  . Not on file   Social Determinants of Health   Financial Resource Strain:   . Difficulty of Paying Living Expenses:   Food Insecurity:   . Worried About Charity fundraiser in the Last Year:   . Arboriculturist in the Last Year:   Transportation Needs:   . Film/video editor (Medical):   Marland Kitchen Lack of Transportation (Non-Medical):   Physical Activity:   . Days of Exercise per Week:   . Minutes of Exercise per Session:   Stress:   . Feeling of Stress :   Social Connections:   . Frequency of Communication with Friends and Family:   . Frequency of Social Gatherings with Friends and Family:   . Attends Religious Services:   . Active Member of Clubs or Organizations:   . Attends Archivist Meetings:   Marland Kitchen Marital Status:   Intimate Partner Violence:   . Fear of Current or Ex-Partner:   . Emotionally Abused:   Marland Kitchen Physically Abused:   . Sexually Abused:      PHYSICAL EXAM PHYSICAL EXAM  Vitals:   11/19/19 1305  BP: 125/85  Pulse: 81  Temp: 97.9 F (36.6 C)  SpO2: 98%  Weight: 256 lb (116.1 kg)  Height: 6' (1.829 m)    Body mass index is 34.72 kg/m.   General: The patient is well-developed and well-nourished and in no acute distress.  Head is [place]/AT   Neurologic Exam  Mental status: The patient is alert and oriented x 3 at the time of the examination. The patient has apparent normal recent and remote memory, with an apparently normal attention span and concentration ability.   Speech is normal.  Cranial nerves: Extraocular movements are full. Pupils are equal, round, and reactive to light and accomodation.  Color vision is slightly reduced OD.  VA is slightly reduced OD (20/50 vs 20/40).  Facial symmetry is present. There is good facial sensation to soft touch bilaterally.Facial strength is normal.  Trapezius and sternocleidomastoid strength is normal. No dysarthria is noted.  The tongue is midline, and the patient  has symmetric elevation of the soft palate. No obvious hearing deficits are noted.  Motor:  Muscle bulk is normal.   Tone is increased in legs, right > left.. Strength is  5 / 5 in all 4 extremities.   Sensory: Intact sensation to touch, vibration in arms but reduced vibration and touch in right leg relative to the left  Coordination: Cerebellar testing reveals good finger-nose-finger and reduced right heel-to-shin bilaterally.  Gait and station: Station is normal.   The gait is mildly wide.  Tandem gait is wide.  Romberg is negative.   Reflexes: Deep tendon reflexes are symmetric and normal bilaterally.        DIAGNOSTIC DATA (LABS, IMAGING, TESTING) - I reviewed patient records, labs, notes, testing and imaging myself where available.  Lab Results  Component Value Date   WBC 14.2 (H) 01/20/2019   HGB 13.3 01/20/2019   HCT 40.3 01/20/2019   MCV 90.0 01/20/2019   PLT 307 01/20/2019  Component Value Date/Time   NA 142 07/06/2019 1423   K 3.5 07/06/2019 1423   CL 104 07/06/2019 1423   CO2 30 07/06/2019 1423   GLUCOSE 117 (H) 07/06/2019 1423   BUN 11 07/06/2019 1423   CREATININE 0.88 07/06/2019 1423   CALCIUM 9.6 07/06/2019 1423   PROT 7.1 07/06/2019 1423   ALBUMIN 4.4 07/06/2019 1423   AST 15 07/06/2019 1423   ALT 18 07/06/2019 1423   ALKPHOS 85 07/06/2019 1423   BILITOT 0.7 07/06/2019 1423   GFRNONAA >60 01/20/2019 0450   GFRAA >60 01/20/2019 0450   Lab Results  Component Value Date   CHOL 163 07/06/2019   HDL 35.80 (L) 07/06/2019   LDLCALC 154 (H) 12/20/2017   LDLDIRECT 96.0 07/06/2019   TRIG 227.0 (H) 07/06/2019   CHOLHDL 5 07/06/2019   Lab Results  Component Value Date   HGBA1C 8.1 (H) 07/06/2019    Lab Results  Component Value Date   TSH 0.92 07/06/2019       ASSESSMENT AND PLAN  1. Transverse myelitis ([place])   2. Right leg weakness   3. Numbness   4. Cervical spinal stenosis      1.  She had transverse myelitis with 2 foci shortly  after a COVID-19 infection.  The etiology could be idiopathic or post infectious transverse myelitis or due to multiple sclerosis.   We will need to recheck an MRI of the brain and cervical and thoracic spine around the end of 2021 .  If any new lesions, then she is more likely to have multiple sclerosis will begin a disease modifying therapy.  2.   Continue baclofen up to 3 times a day and if tolerated can increase further. 3.   Reorder physical therapy for the weakness and gait difficulties. 4.   Return in 6 months or call sooner if there are new or worsening neurologic symptoms.    Shardai Star A. Felecia Shelling, MD, [facility] [id number], [id number] PM Certified in Neurology, Clinical Neurophysiology, Sleep Medicine and Neuroimaging  [facility] [address] [place], [place] [address] [phone]

## 2019-11-25 ENCOUNTER — Other Ambulatory Visit: Payer: Self-pay | Admitting: Internal Medicine

## 2020-02-18 ENCOUNTER — Other Ambulatory Visit: Payer: Self-pay | Admitting: Neurology

## 2020-04-27 ENCOUNTER — Other Ambulatory Visit: Payer: Self-pay | Admitting: Internal Medicine

## 2020-05-03 ENCOUNTER — Other Ambulatory Visit: Payer: Self-pay

## 2020-06-16 NOTE — Progress Notes (Signed)
Chief Complaint  Patient presents with  . Annual Exam    still having bil feet numbness, having hair loss wants derm referral    HPI: Patient  [name]  49 y.o. comes in today for Preventive Health Care visit  And Chronic disease management DM  Only on one  Metformin and  10 units of basiglar and ran out  coulent tolerated more betofrmin Fu hap planned for 3-4 mos but never happened and pt unaware   Result note  . Bp  Had bene ok some weight gain 2 pills left of medication Transverse myelitis or other  Neuro sx poss form covid  Followed neuro still has some numbness  Left and feel weak right leg  Sees dr Felecia Shelling about eery 6 mos  Due for eye check   Has appt. Wants to lose weight some  wals with cane if needed   But otherwise independent  Hari loss over past 9 mos  Front and top of head  Neg fam hx  [place] bout derm appt  Keloids  Some worse no changing  Has had hysterectomy  To get mammo  Will need colon cancer screen referral   HLD rans out of crestor for about 2 mos  Health Maintenance  Topic Date Due  . Hepatitis C Screening  Never done  . PAP SMEAR-Modifier  08/21/2016  . MAMMOGRAM  06/13/2020  . COLONOSCOPY  Never done  . TETANUS/TDAP  08/18/2020 (Originally 08/17/2015)  . COVID-19 Vaccine (1) 08/18/2020 (Originally [birth year]1982)  . HIV Screening  Completed  . INFLUENZA VACCINE  Discontinued   Health Maintenance Review LIFESTYLE:  Exercise:   Walks  At work  Still hurts a lot  Problems  Steps a challenge   Tobacco/ETS:   No   Alcohol:   Rare  Social  Sugar beverages: Sleep:  Struggles   Cant sleep on back  osa cpap  Wakens  Drug use: no HH of   3     Pet dog  Work:  42 hours per week   ROS:    GEN/ HEENT: No fever, significant weight changes sweats headaches vision problems hearing changes, CV/ PULM; No chest pain shortness of breath cough, syncope,edema  change in exercise tolerance. GI /GU: No adominal pain, vomiting, change in bowel habits. No blood in the  stool. No significant GU symptoms. SKIN/HEME: ,no acute skin rashes suspicious lesions or bleeding. No lymphadenopathy, nodules, masses.  NEURO/ PSYCH:  No neurologic signs such as weakness numbness. No depression anxiety. IMM/ Allergy: No unusual infections.  Allergy .   REST of 12 system review negative except as per HPI   Past Medical History:  Diagnosis Date  . Hypertension   . Hypoglycemia   . Radiation 10/21/2015-10/23/2015   Anterior neck area 12 gray in 3 fractions  . Teratoma     Past Surgical History:  Procedure Laterality Date  . ABDOMINAL HERNIA REPAIR    . ABDOMINAL HYSTERECTOMY    . CESAREAN SECTION    . CRYOTHERAPY  2013   to neck  . DERMOID CYST  EXCISION    . ECTOPIC PREGNANCY SURGERY    . EXCISION MASS NECK Bilateral 10/20/2015   Procedure: EXCISION OF LARGE KELOIDS SEVERE RIGHT AND LEFT NECK WITH PLASTIC RECONSTRUCTION;  Surgeon: Cristine Polio, MD;  Location: [facility];  Service: Plastics;  Laterality: Bilateral;  . HAND SURGERY     amputation of right index finger    Family History  Problem Relation Age  of Onset  . Hypertension Other   . Hypertension Mother   . Diabetes Mother   . Diabetes Father   . Thyroid disease Father   . Heart failure Father     Social History   Socioeconomic History  . Marital status: Married    Spouse name: Not on file  . Number of children: 2  . Years of education: Not on file  . Highest education level: Not on file  Occupational History  . Occupation: Therapist, music  Tobacco Use  . Smoking status: Never Smoker  . Smokeless tobacco: Never Used  Vaping Use  . Vaping Use: Never used  Substance and Sexual Activity  . Alcohol use: Yes    Comment: socially  . Drug use: No  . Sexual activity: Yes    Birth control/protection: Surgical  Other Topics Concern  . Not on file  Social History Narrative  . Not on file   Social Determinants of Health   Financial Resource Strain:   . Difficulty of  Paying Living Expenses: Not on file  Food Insecurity:   . Worried About Charity fundraiser in the Last Year: Not on file  . Ran Out of Food in the Last Year: Not on file  Transportation Needs:   . Lack of Transportation (Medical): Not on file  . Lack of Transportation (Non-Medical): Not on file  Physical Activity:   . Days of Exercise per Week: Not on file  . Minutes of Exercise per Session: Not on file  Stress:   . Feeling of Stress : Not on file  Social Connections:   . Frequency of Communication with Friends and Family: Not on file  . Frequency of Social Gatherings with Friends and Family: Not on file  . Attends Religious Services: Not on file  . Active Member of Clubs or Organizations: Not on file  . Attends Archivist Meetings: Not on file  . Marital Status: Not on file    Outpatient Medications Prior to Visit  Medication Sig Dispense Refill  . baclofen (LIORESAL) 10 MG tablet Take 1 tablet (10 mg total) by mouth 3 (three) times daily. (Patient taking differently: Take 10 mg by mouth daily. ) 90 each 3  . vitamin C (VITAMIN C) 500 MG tablet Take 1 tablet (500 mg total) by mouth daily. Please take for 2 weeks    . zinc sulfate 220 (50 Zn) MG capsule Take 1 capsule (220 mg total) by mouth daily. Please take for 2 weeks    . Insulin Glargine (BASAGLAR KWIKPEN) 100 UNIT/ML SOPN Inject 0.04 mLs (4 Units total) into the skin daily. With pen needles.increase  2 units  As directed 15 mL 1  . metFORMIN (GLUCOPHAGE) 500 MG tablet TAKE 4 TABLETS BY MOUTH EVERY DAY Needs appointment for refills. 120 tablet 0  . Olmesartan-amLODIPine-HCTZ 40-5-25 MG TABS TAKE 1 TABLET BY MOUTH DAILY 90 tablet 0  . rosuvastatin (CRESTOR) 10 MG tablet Take 1 tablet (10 mg total) by mouth daily. 90 tablet 1  . cyclobenzaprine (FLEXERIL) 5 MG tablet TAKE 1 TABLET(5 MG) BY MOUTH THREE TIMES DAILY AS NEEDED FOR MUSCLE SPASMS (Patient not taking: Reported on 06/17/2020) 90 tablet 2   No  facility-administered medications prior to visit.     EXAM:  BP (!) 142/96 (BP Location: Right Arm, Patient Position: Sitting, Cuff Size: Large)   Pulse 68   Temp 98.2 F (36.8 C) (Oral)   Ht 6' (1.829 m)   Wt 257 lb (116.6  kg)   SpO2 99%   BMI 34.86 kg/m   Body mass index is 34.86 kg/m. Wt Readings from Last 3 Encounters:  06/17/20 257 lb (116.6 kg)  11/19/19 256 lb (116.1 kg)  07/06/19 245 lb 12.8 oz (111.5 kg)    Physical Exam: Vital signs reviewed RJJ:OACZ is a well-developed well-nourished alert cooperative    who appearsr stated age in no acute distress.  HEENT: normocephalic atraumatic , Eyes: PERRL EOM's full, conjunctiva clear, Nares: paten,t no deformity discharge or tenderness., Ears: no deformity EAC's clear TMs with normal landmarks. Mouth:masked NECK: supple without masses, thyromegaly or bruits. CHEST/PULM:  Clear to auscultation and percussion breath sounds equal no wheeze , rales or rhonchi. No chest wall deformities or tenderness. Breast: normal by inspection . No dimpling, discharge, masses, tenderness or discharge . CV: PMI is nondisplaced, S1 S2 no gallops, murmurs, rubs. Peripheral pulses are full without delay.No JVD .  ABDOMEN: Bowel sounds normal nontender  No guard or rebound, no hepato splenomegal no CVA tenderness.  No hernia. Extremtities:  No clubbing cyanosis or edema, no acute joint swelling or redness no focal atrophy NEURO:  Oriented x3, cranial nerves 3-12 appear to be intact, gait slow  Slightly stiff steady  Gross sens to light touch  But says tingling  SKIN: No acute rashes normal turgor, color, no bruising or petechiae. mulitpl keloids    To p of head significant balding   To vertex and frontal  No lesions seen  PSYCH: Oriented, good eye contact, no obvious depression anxiety, cognition and judgment appear normal. LN: no cervical axillary inguinal adenopathy   Lab Results  Component Value Date   WBC 14.2 (H) 01/20/2019   HGB 13.3  01/20/2019   HCT 40.3 01/20/2019   PLT 307 01/20/2019   GLUCOSE 117 (H) 07/06/2019   CHOL 163 07/06/2019   TRIG 227.0 (H) 07/06/2019   HDL 35.80 (L) 07/06/2019   LDLDIRECT 96.0 07/06/2019   LDLCALC 154 (H) 12/20/2017   ALT 18 07/06/2019   AST 15 07/06/2019   NA 142 07/06/2019   K 3.5 07/06/2019   CL 104 07/06/2019   CREATININE 0.88 07/06/2019   BUN 11 07/06/2019   CO2 30 07/06/2019   TSH 0.92 07/06/2019   HGBA1C 8.1 (H) 07/06/2019    BP Readings from Last 3 Encounters:  06/17/20 (!) 142/96  11/19/19 125/85  07/06/19 122/66    Lab plan reviewed with patient   ASSESSMENT AND PLAN:  Discussed the following assessment and plan:    ICD-10-CM   1. Visit for preventive health examination  Z00.00 TSH    Hemoglobin A1c    Hepatic function panel    Lipid panel    BASIC METABOLIC PANEL WITH GFR    CBC with Differential/Platelet    T4, free    Microalbumin / creatinine urine ratio    ANA    Vitamin B12    Vitamin B12    ANA    Microalbumin / creatinine urine ratio    T4, free    CBC with Differential/Platelet    BASIC METABOLIC PANEL WITH GFR    Lipid panel    Hepatic function panel    Hemoglobin A1c    TSH  2. Essential hypertension  I10 TSH    Hemoglobin A1c    Hepatic function panel    Lipid panel    BASIC METABOLIC PANEL WITH GFR    CBC with Differential/Platelet    T4, free    Microalbumin / creatinine  urine ratio    ANA    Vitamin B12    Vitamin B12    ANA    Microalbumin / creatinine urine ratio    T4, free    CBC with Differential/Platelet    BASIC METABOLIC PANEL WITH GFR    Lipid panel    Hepatic function panel    Hemoglobin A1c    TSH  3. Medication management  Z79.899 TSH    Hemoglobin A1c    Hepatic function panel    Lipid panel    BASIC METABOLIC PANEL WITH GFR    CBC with Differential/Platelet    T4, free    Microalbumin / creatinine urine ratio    ANA    Vitamin B12    Vitamin B12    ANA    Microalbumin / creatinine urine ratio     T4, free    CBC with Differential/Platelet    BASIC METABOLIC PANEL WITH GFR    Lipid panel    Hepatic function panel    Hemoglobin A1c    TSH  4. Hyperlipidemia, unspecified hyperlipidemia type  E78.5 TSH    Hemoglobin A1c    Hepatic function panel    Lipid panel    BASIC METABOLIC PANEL WITH GFR    CBC with Differential/Platelet    T4, free    Microalbumin / creatinine urine ratio    ANA    Vitamin B12    Vitamin B12    ANA    Microalbumin / creatinine urine ratio    T4, free    CBC with Differential/Platelet    BASIC METABOLIC PANEL WITH GFR    Lipid panel    Hepatic function panel    Hemoglobin A1c    TSH  5. Transverse myelitis (HCC)  G37.3 TSH    Hemoglobin A1c    Hepatic function panel    Lipid panel    BASIC METABOLIC PANEL WITH GFR    CBC with Differential/Platelet    T4, free    Microalbumin / creatinine urine ratio    ANA    Vitamin B12    Vitamin B12    ANA    Microalbumin / creatinine urine ratio    T4, free    CBC with Differential/Platelet    BASIC METABOLIC PANEL WITH GFR    Lipid panel    Hepatic function panel    Hemoglobin A1c    TSH  6. Numbness  R20.0 TSH    Hemoglobin A1c    Hepatic function panel    Lipid panel    BASIC METABOLIC PANEL WITH GFR    CBC with Differential/Platelet    T4, free    Microalbumin / creatinine urine ratio    ANA    Vitamin B12    Vitamin B12    ANA    Microalbumin / creatinine urine ratio    T4, free    CBC with Differential/Platelet    BASIC METABOLIC PANEL WITH GFR    Lipid panel    Hepatic function panel    Hemoglobin A1c    TSH  7. Hair loss  L65.9 TSH    Hemoglobin A1c    Hepatic function panel    Lipid panel    BASIC METABOLIC PANEL WITH GFR    CBC with Differential/Platelet    T4, free    Microalbumin / creatinine urine ratio    ANA    Vitamin B12    Vitamin B12    ANA  Microalbumin / creatinine urine ratio    T4, free    CBC with Differential/Platelet    BASIC METABOLIC  PANEL WITH GFR    Lipid panel    Hepatic function panel    Hemoglobin A1c    TSH    Ambulatory referral to Dermatology    significant  balding loss top of head  refer and record review  8. Type 2 diabetes mellitus with hyperglycemia, with long-term current use of insulin (HCC)  E11.65    Z79.4    delayed fu from last  year   9. Need for hepatitis C screening test  Z11.59 Hepatitis C antibody    Hepatitis C antibody  10. Colon cancer screening  Z12.11 Ambulatory referral to Gastroenterology  11. OSA (obstructive sleep apnea)  G47.33   12. History of COVID-19  Z86.16 Ambulatory referral to Dermatology  13. Influenza vaccination declined by patient  H41.93   79. Right leg weakness  R29.898   15. Keloid scar  L91.0      Last labs  Done 11 20   No diabetes fu?   ? If never notified  Or doesn't remember    Derm consult large  amount of almost balding on frontal and all top of vertex   No other increase  Hair   Androgenic  Changes  Disc ozempic and sgl2 meds  Stay on metformin  Try to do 2 por day  Although has only tolerated  500 per day  Depending we may add ozempic and stop the insulin.   plan 4 mos rov to make sure  All at goal  She has been out of crestor and recent insulin so  Labs may not be reflective of taking all these meds.   Suspect numbness a combo of original  Neuro lesions  And  Poss even diabetes .   review of record after patient left: noted elevated  Testosterone an dhea in 2019 and plan for referral but not sure if ever done.      Return in about 4 months (around 10/15/2020) for depending on results.  Patient Care Team: Citlally Captain, Standley Brooking, MD as PCP - General (Internal Medicine) Felecia Shelling, Nanine Means, MD (Neurology) Patient Instructions  Will do dermatology referral for hair loss. Ref for colonoscopy. Will notify you  of labs when available.  We may change to ozempic or such  For sugar control and help with weight loss.   Get your eye exam    Health Maintenance,  Female Adopting a healthy lifestyle and getting preventive care are important in promoting health and wellness. Ask your health care provider about:  The right schedule for you to have regular tests and exams.  Things you can do on your own to prevent diseases and keep yourself healthy. What should I know about diet, weight, and exercise? Eat a healthy diet   Eat a diet that includes plenty of vegetables, fruits, low-fat dairy products, and lean protein.  Do not eat a lot of foods that are high in solid fats, added sugars, or sodium. Maintain a healthy weight Body mass index (BMI) is used to identify weight problems. It estimates body fat based on height and weight. Your health care provider can help determine your BMI and help you achieve or maintain a healthy weight. Get regular exercise Get regular exercise. This is one of the most important things you can do for your health. Most adults should:  Exercise for at least 150 minutes each week. The exercise  should increase your heart rate and make you sweat (moderate-intensity exercise).  Do strengthening exercises at least twice a week. This is in addition to the moderate-intensity exercise.  Spend less time sitting. Even light physical activity can be beneficial. Watch cholesterol and blood lipids Have your blood tested for lipids and cholesterol at 50 years of age, then have this test every 5 years. Have your cholesterol levels checked more often if:  Your lipid or cholesterol levels are high.  You are older than 50 years of age.  You are at high risk for heart disease. What should I know about cancer screening? Depending on your health history and family history, you may need to have cancer screening at various ages. This may include screening for:  Breast cancer.  Cervical cancer.  Colorectal cancer.  Skin cancer.  Lung cancer. What should I know about heart disease, diabetes, and high blood pressure? Blood pressure  and heart disease  High blood pressure causes heart disease and increases the risk of stroke. This is more likely to develop in people who have high blood pressure readings, are of African descent, or are overweight.  Have your blood pressure checked: ? Every 3-5 years if you are 61-47 years of age. ? Every year if you are 17 years old or older. Diabetes Have regular diabetes screenings. This checks your fasting blood sugar level. Have the screening done:  Once every three years after age 57 if you are at a normal weight and have a low risk for diabetes.  More often and at a younger age if you are overweight or have a high risk for diabetes. What should I know about preventing infection? Hepatitis B If you have a higher risk for hepatitis B, you should be screened for this virus. Talk with your health care provider to find out if you are at risk for hepatitis B infection. Hepatitis C Testing is recommended for:  Everyone born from [birth year] through 1966.  Anyone with known risk factors for hepatitis C. Sexually transmitted infections (STIs)  Get screened for STIs, including gonorrhea and chlamydia, if: ? You are sexually active and are younger than 50 years of age. ? You are older than 50 years of age and your health care provider tells you that you are at risk for this type of infection. ? Your sexual activity has changed since you were last screened, and you are at increased risk for chlamydia or gonorrhea. Ask your health care provider if you are at risk.  Ask your health care provider about whether you are at high risk for HIV. Your health care provider may recommend a prescription medicine to help prevent HIV infection. If you choose to take medicine to prevent HIV, you should first get tested for HIV. You should then be tested every 3 months for as long as you are taking the medicine. Pregnancy  If you are about to stop having your period (premenopausal) and you may become pregnant,  seek counseling before you get pregnant.  Take 400 to 800 micrograms (mcg) of folic acid every day if you become pregnant.  Ask for birth control (contraception) if you want to prevent pregnancy. Osteoporosis and menopause Osteoporosis is a disease in which the bones lose minerals and strength with aging. This can result in bone fractures. If you are 35 years old or older, or if you are at risk for osteoporosis and fractures, ask your health care provider if you should:  Be screened for bone loss.  Take a calcium or vitamin D supplement to lower your risk of fractures.  Be given hormone replacement therapy (HRT) to treat symptoms of menopause. Follow these instructions at home: Lifestyle  Do not use any products that contain nicotine or tobacco, such as cigarettes, e-cigarettes, and chewing tobacco. If you need help quitting, ask your health care provider.  Do not use street drugs.  Do not share needles.  Ask your health care provider for help if you need support or information about quitting drugs. Alcohol use  Do not drink alcohol if: ? Your health care provider tells you not to drink. ? You are pregnant, may be pregnant, or are planning to become pregnant.  If you drink alcohol: ? Limit how much you use to 0-1 drink a day. ? Limit intake if you are breastfeeding.  Be aware of how much alcohol is in your drink. In the [place] one drink equals one 12 oz bottle of beer (355 mL), one 5 oz glass of wine (148 mL), or one 1 oz glass of hard liquor (44 mL). General instructions  Schedule regular health, dental, and eye exams.  Stay current with your vaccines.  Tell your health care provider if: ? You often feel depressed. ? You have ever been abused or do not feel safe at home. Summary  Adopting a healthy lifestyle and getting preventive care are important in promoting health and wellness.  Follow your health care provider's instructions about healthy diet, exercising, and  getting tested or screened for diseases.  Follow your health care provider's instructions on monitoring your cholesterol and blood pressure. This information is not intended to replace advice given to you by your health care provider. Make sure you discuss any questions you have with your health care provider. Document Revised: 07/26/2018 Document Reviewed: 07/26/2018 Elsevier Patient Education  2020 [place] Aaren Krog M.D.  Review of last neuro evaluation Dr Felecia Shelling  1. Transverse myelitis ([place])   2. Right leg weakness   3. Numbness   4. Cervical spinal stenosis      1.  She had transverse myelitis with 2 foci shortly after a COVID-19 infection.  The etiology could be idiopathic or post infectious transverse myelitis or due to multiple sclerosis.   We will need to recheck an MRI of the brain and cervical and thoracic spine around the end of 2021 .  If any new lesions, then she is more likely to have multiple sclerosis will begin a disease modifying therapy.  2.   Continue baclofen up to 3 times a day and if tolerated can increase further. 3.   Reorder physical therapy for the weakness and gait difficulties. 4.   Return in 6 months or call sooner if there are new or worsening neurologic symptoms.

## 2020-06-17 ENCOUNTER — Ambulatory Visit (INDEPENDENT_AMBULATORY_CARE_PROVIDER_SITE_OTHER): Payer: BC Managed Care – PPO | Admitting: Internal Medicine

## 2020-06-17 ENCOUNTER — Encounter: Payer: Self-pay | Admitting: Internal Medicine

## 2020-06-17 ENCOUNTER — Other Ambulatory Visit: Payer: Self-pay

## 2020-06-17 VITALS — BP 142/96 | HR 68 | Temp 98.2°F | Ht 72.0 in | Wt 257.0 lb

## 2020-06-17 DIAGNOSIS — Z79899 Other long term (current) drug therapy: Secondary | ICD-10-CM

## 2020-06-17 DIAGNOSIS — L910 Hypertrophic scar: Secondary | ICD-10-CM

## 2020-06-17 DIAGNOSIS — I10 Essential (primary) hypertension: Secondary | ICD-10-CM | POA: Diagnosis not present

## 2020-06-17 DIAGNOSIS — Z2821 Immunization not carried out because of patient refusal: Secondary | ICD-10-CM

## 2020-06-17 DIAGNOSIS — G373 Acute transverse myelitis in demyelinating disease of central nervous system: Secondary | ICD-10-CM

## 2020-06-17 DIAGNOSIS — Z0000 Encounter for general adult medical examination without abnormal findings: Secondary | ICD-10-CM

## 2020-06-17 DIAGNOSIS — Z1211 Encounter for screening for malignant neoplasm of colon: Secondary | ICD-10-CM | POA: Diagnosis not present

## 2020-06-17 DIAGNOSIS — E785 Hyperlipidemia, unspecified: Secondary | ICD-10-CM

## 2020-06-17 DIAGNOSIS — R29898 Other symptoms and signs involving the musculoskeletal system: Secondary | ICD-10-CM

## 2020-06-17 DIAGNOSIS — Z794 Long term (current) use of insulin: Secondary | ICD-10-CM

## 2020-06-17 DIAGNOSIS — E1165 Type 2 diabetes mellitus with hyperglycemia: Secondary | ICD-10-CM

## 2020-06-17 DIAGNOSIS — L659 Nonscarring hair loss, unspecified: Secondary | ICD-10-CM

## 2020-06-17 DIAGNOSIS — G4733 Obstructive sleep apnea (adult) (pediatric): Secondary | ICD-10-CM

## 2020-06-17 DIAGNOSIS — Z8616 Personal history of COVID-19: Secondary | ICD-10-CM

## 2020-06-17 DIAGNOSIS — R200 Anesthesia of skin: Secondary | ICD-10-CM

## 2020-06-17 DIAGNOSIS — Z1159 Encounter for screening for other viral diseases: Secondary | ICD-10-CM | POA: Diagnosis not present

## 2020-06-17 MED ORDER — OLMESARTAN-AMLODIPINE-HCTZ 40-5-25 MG PO TABS: 1.0000 | ORAL_TABLET | Freq: Every day | ORAL | 0 refills | Status: DC

## 2020-06-17 MED ORDER — METFORMIN HCL 500 MG PO TABS: ORAL_TABLET | ORAL | 0 refills | Status: AC

## 2020-06-17 MED ORDER — BASAGLAR KWIKPEN 100 UNIT/ML ~~LOC~~ SOPN
10.0000 [IU] | PEN_INJECTOR | Freq: Every day | SUBCUTANEOUS | 1 refills | Status: DC
Start: 2020-06-17 — End: 2020-11-17

## 2020-06-17 MED ORDER — ROSUVASTATIN CALCIUM 10 MG PO TABS: 10.0000 mg | ORAL_TABLET | Freq: Every day | ORAL | 1 refills | Status: DC

## 2020-06-17 NOTE — Patient Instructions (Signed)
Will do dermatology referral for hair loss. Ref for colonoscopy. Will notify you  of labs when available.  We may change to ozempic or such  For sugar control and help with weight loss.   Get your eye exam    Health Maintenance, Female Adopting a healthy lifestyle and getting preventive care are important in promoting health and wellness. Ask your health care provider about:  The right schedule for you to have regular tests and exams.  Things you can do on your own to prevent diseases and keep yourself healthy. What should I know about diet, weight, and exercise? Eat a healthy diet   Eat a diet that includes plenty of vegetables, fruits, low-fat dairy products, and lean protein.  Do not eat a lot of foods that are high in solid fats, added sugars, or sodium. Maintain a healthy weight Body mass index (BMI) is used to identify weight problems. It estimates body fat based on height and weight. Your health care provider can help determine your BMI and help you achieve or maintain a healthy weight. Get regular exercise Get regular exercise. This is one of the most important things you can do for your health. Most adults should:  Exercise for at least 150 minutes each week. The exercise should increase your heart rate and make you sweat (moderate-intensity exercise).  Do strengthening exercises at least twice a week. This is in addition to the moderate-intensity exercise.  Spend less time sitting. Even light physical activity can be beneficial. Watch cholesterol and blood lipids Have your blood tested for lipids and cholesterol at 50 years of age, then have this test every 5 years. Have your cholesterol levels checked more often if:  Your lipid or cholesterol levels are high.  You are older than 50 years of age.  You are at high risk for heart disease. What should I know about cancer screening? Depending on your health history and family history, you may need to have cancer  screening at various ages. This may include screening for:  Breast cancer.  Cervical cancer.  Colorectal cancer.  Skin cancer.  Lung cancer. What should I know about heart disease, diabetes, and high blood pressure? Blood pressure and heart disease  High blood pressure causes heart disease and increases the risk of stroke. This is more likely to develop in people who have high blood pressure readings, are of African descent, or are overweight.  Have your blood pressure checked: ? Every 3-5 years if you are 57-85 years of age. ? Every year if you are 51 years old or older. Diabetes Have regular diabetes screenings. This checks your fasting blood sugar level. Have the screening done:  Once every three years after age 87 if you are at a normal weight and have a low risk for diabetes.  More often and at a younger age if you are overweight or have a high risk for diabetes. What should I know about preventing infection? Hepatitis B If you have a higher risk for hepatitis B, you should be screened for this virus. Talk with your health care provider to find out if you are at risk for hepatitis B infection. Hepatitis C Testing is recommended for:  Everyone born from [birth year] through 1966.  Anyone with known risk factors for hepatitis C. Sexually transmitted infections (STIs)  Get screened for STIs, including gonorrhea and chlamydia, if: ? You are sexually active and are younger than 50 years of age. ? You are older than 50 years of age  and your health care provider tells you that you are at risk for this type of infection. ? Your sexual activity has changed since you were last screened, and you are at increased risk for chlamydia or gonorrhea. Ask your health care provider if you are at risk.  Ask your health care provider about whether you are at high risk for HIV. Your health care provider may recommend a prescription medicine to help prevent HIV infection. If you choose to take  medicine to prevent HIV, you should first get tested for HIV. You should then be tested every 3 months for as long as you are taking the medicine. Pregnancy  If you are about to stop having your period (premenopausal) and you may become pregnant, seek counseling before you get pregnant.  Take 400 to 800 micrograms (mcg) of folic acid every day if you become pregnant.  Ask for birth control (contraception) if you want to prevent pregnancy. Osteoporosis and menopause Osteoporosis is a disease in which the bones lose minerals and strength with aging. This can result in bone fractures. If you are 69 years old or older, or if you are at risk for osteoporosis and fractures, ask your health care provider if you should:  Be screened for bone loss.  Take a calcium or vitamin D supplement to lower your risk of fractures.  Be given hormone replacement therapy (HRT) to treat symptoms of menopause. Follow these instructions at home: Lifestyle  Do not use any products that contain nicotine or tobacco, such as cigarettes, e-cigarettes, and chewing tobacco. If you need help quitting, ask your health care provider.  Do not use street drugs.  Do not share needles.  Ask your health care provider for help if you need support or information about quitting drugs. Alcohol use  Do not drink alcohol if: ? Your health care provider tells you not to drink. ? You are pregnant, may be pregnant, or are planning to become pregnant.  If you drink alcohol: ? Limit how much you use to 0-1 drink a day. ? Limit intake if you are breastfeeding.  Be aware of how much alcohol is in your drink. In the [place] one drink equals one 12 oz bottle of beer (355 mL), one 5 oz glass of wine (148 mL), or one 1 oz glass of hard liquor (44 mL). General instructions  Schedule regular health, dental, and eye exams.  Stay current with your vaccines.  Tell your health care provider if: ? You often feel depressed. ? You have  ever been abused or do not feel safe at home. Summary  Adopting a healthy lifestyle and getting preventive care are important in promoting health and wellness.  Follow your health care provider's instructions about healthy diet, exercising, and getting tested or screened for diseases.  Follow your health care provider's instructions on monitoring your cholesterol and blood pressure. This information is not intended to replace advice given to you by your health care provider. Make sure you discuss any questions you have with your health care provider. Document Revised: 07/26/2018 Document Reviewed: 07/26/2018 Elsevier Patient Education  2020 [employer].

## 2020-06-19 LAB — BASIC METABOLIC PANEL WITH GFR
BUN: 13 mg/dL (ref 7–25)
CO2: 29 mmol/L (ref 20–32)
Calcium: 10.1 mg/dL (ref 8.6–10.4)
Chloride: 101 mmol/L (ref 98–110)
Creat: 0.77 mg/dL (ref 0.50–1.05)
GFR, Est African American: 104 mL/min/{1.73_m2} (ref >=60)
GFR, Est Non African American: 90 mL/min/{1.73_m2} (ref >=60)
Glucose, Bld: 156 mg/dL — ABNORMAL HIGH (ref 65–99)
Potassium: 4 mmol/L (ref 3.5–5.3)
Sodium: 140 mmol/L (ref 135–146)

## 2020-06-19 LAB — CBC WITH DIFFERENTIAL/PLATELET
Absolute Monocytes: 455 cells/uL (ref 200–950)
Basophils Absolute: 28 cells/uL (ref 0–200)
Basophils Relative: 0.4 %
Eosinophils Absolute: 98 cells/uL (ref 15–500)
Eosinophils Relative: 1.4 %
HCT: 39.2 % (ref 35.0–45.0)
Hemoglobin: 13 g/dL (ref 11.7–15.5)
Lymphs Abs: 3185 cells/uL (ref 850–3900)
MCH: 29.7 pg (ref 27.0–33.0)
MCHC: 33.2 g/dL (ref 32.0–36.0)
MCV: 89.7 fL (ref 80.0–100.0)
MPV: 10.3 fL (ref 7.5–12.5)
Monocytes Relative: 6.5 %
Neutro Abs: 3234 cells/uL (ref 1500–7800)
Neutrophils Relative %: 46.2 %
Platelets: 266 10*3/uL (ref 140–400)
RBC: 4.37 10*6/uL (ref 3.80–5.10)
RDW: 12.9 % (ref 11.0–15.0)
Total Lymphocyte: 45.5 %
WBC: 7 10*3/uL (ref 3.8–10.8)

## 2020-06-19 LAB — HEPATIC FUNCTION PANEL
AG Ratio: 1.6 (calc) (ref 1.0–2.5)
ALT: 26 U/L (ref 6–29)
AST: 17 U/L (ref 10–35)
Albumin: 4.5 g/dL (ref 3.6–5.1)
Alkaline phosphatase (APISO): 114 U/L (ref 37–153)
Bilirubin, Direct: 0.1 mg/dL (ref 0.0–0.2)
Globulin: 2.9 g/dL (calc) (ref 1.9–3.7)
Indirect Bilirubin: 0.6 mg/dL (calc) (ref 0.2–1.2)
Total Bilirubin: 0.7 mg/dL (ref 0.2–1.2)
Total Protein: 7.4 g/dL (ref 6.1–8.1)

## 2020-06-19 LAB — MICROALBUMIN / CREATININE URINE RATIO
Creatinine, Urine: 116 mg/dL (ref 20–275)
Microalb Creat Ratio: 10 mcg/mg creat (ref <30)
Microalb, Ur: 1.2 mg/dL

## 2020-06-19 LAB — HEMOGLOBIN A1C
Hgb A1c MFr Bld: 9 % of total Hgb — ABNORMAL HIGH (ref <5.7)
Mean Plasma Glucose: 212 (calc)
eAG (mmol/L): 11.7 (calc)

## 2020-06-19 LAB — LIPID PANEL
Cholesterol: 233 mg/dL — ABNORMAL HIGH (ref <200)
HDL: 45 mg/dL — ABNORMAL LOW (ref >=50)
LDL Cholesterol (Calc): 157 mg/dL (calc) — ABNORMAL HIGH
Non-HDL Cholesterol (Calc): 188 mg/dL (calc) — ABNORMAL HIGH (ref <130)
Total CHOL/HDL Ratio: 5.2 (calc) — ABNORMAL HIGH (ref <5.0)
Triglycerides: 179 mg/dL — ABNORMAL HIGH (ref <150)

## 2020-06-19 LAB — VITAMIN B12: Vitamin B-12: 358 pg/mL (ref 200–1100)

## 2020-06-19 LAB — T4, FREE: Free T4: 1.3 ng/dL (ref 0.8–1.8)

## 2020-06-19 LAB — ANA: Anti Nuclear Antibody (ANA): NEGATIVE

## 2020-06-19 LAB — HEPATITIS C ANTIBODY
Hepatitis C Ab: NONREACTIVE
SIGNAL TO CUT-OFF: 0.03 (ref <1.00)

## 2020-06-19 LAB — TSH: TSH: 1.73 mIU/L

## 2020-06-30 ENCOUNTER — Other Ambulatory Visit: Payer: Self-pay | Admitting: Internal Medicine

## 2020-06-30 MED ORDER — OZEMPIC (0.25 OR 0.5 MG/DOSE) 2 MG/1.5ML ~~LOC~~ SOPN: PEN_INJECTOR | SUBCUTANEOUS | 3 refills | Status: DC

## 2020-06-30 NOTE — Progress Notes (Signed)
So yes the a1c is still too high as well as cholesterol.  Add ozempic  weekly  and plan follow up in 3 months or as needed  I sent in to pharmacy    Please make her appt   for 3 months  and we will do a11c at this  Goal :fasting glucose below 120 and after eating below 160 - 180

## 2020-07-21 ENCOUNTER — Ambulatory Visit (INDEPENDENT_AMBULATORY_CARE_PROVIDER_SITE_OTHER): Payer: BC Managed Care – PPO | Admitting: Neurology

## 2020-07-21 ENCOUNTER — Encounter: Payer: Self-pay | Admitting: Neurology

## 2020-07-21 VITALS — BP 153/99 | HR 87 | Ht 72.0 in | Wt 255.5 lb

## 2020-07-21 DIAGNOSIS — G373 Acute transverse myelitis in demyelinating disease of central nervous system: Secondary | ICD-10-CM

## 2020-07-21 DIAGNOSIS — R29898 Other symptoms and signs involving the musculoskeletal system: Secondary | ICD-10-CM | POA: Diagnosis not present

## 2020-07-21 DIAGNOSIS — R200 Anesthesia of skin: Secondary | ICD-10-CM | POA: Diagnosis not present

## 2020-07-21 DIAGNOSIS — M4802 Spinal stenosis, cervical region: Secondary | ICD-10-CM

## 2020-07-21 DIAGNOSIS — R269 Unspecified abnormalities of gait and mobility: Secondary | ICD-10-CM

## 2020-07-21 MED ORDER — BACLOFEN 10 MG PO TABS: 10.0000 mg | ORAL_TABLET | Freq: Every day | ORAL | 4 refills | Status: DC | PRN

## 2020-07-21 MED ORDER — CYCLOBENZAPRINE HCL 5 MG PO TABS: ORAL_TABLET | ORAL | 4 refills | Status: DC

## 2020-07-21 NOTE — Progress Notes (Signed)
[facility]  PATIENT: [name] DOB: 1971-05-26  REFERRING DOCTOR OR PCP: Shanon Ace MD SOURCE: Patient, notes from primary care, notes from emergency rooms and hospital stay 01/16/2019 through 01/19/2019 and lab work over the last month, multiple MRI reports were reviewed, multiple MRI images reviewed on PACS.  _________________________________   HISTORICAL  CHIEF COMPLAINT:  Chief Complaint  Patient presents with  . Follow-up    RM 12, alone. Last seen 11/19/2019 for transverse myelitis. Takes cyclobenzaprine very rare.     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  They occurred shortly after a COVID-19 infection.  We repeated MRI of the brain and cervical spine 07/23/2019.  Studies were reviewed.     She notes mild weakness in the legs, right > left   She has rihgt > left foot numbness as well.  She has trouble with stairs.   She walks without a cane.  She feels unchanged over the last 6 months.  She takes baclofen only as needed, never more than once a day and she takes Flexeril prn as well, rarely during the day as it makes her sleepy.  She no longer has dysesthesias.  She has mild visual issues helped by glasses which appear stable.   She feels bladder function is fine.      The MRI of the brain was negative.  MRI of the cervical spine showed that the focus that had been seen on the previous MRI it was no longer apparent.  She does have some degenerative changes with borderline spinal stenosis at C3-C4 and C4-C5 and mild spinal stenosis at C5-C6.  This causes foraminal narrowing to the left at C5-C6 and to the right at C6-C7 with some encroachment upon the nerve roots but no definite nerve root compression.  Transverse myelitis Hx.  In June 2020, she presented with lower back pain and right > left leg pain and dysesthesia.   She notes right foot pain  radiating into the foot (sole > dorsum).   She has numbness in the region of pain.    She has less pain and no numbness in the left foot but there is a warm sensation at times.     Symptoms started about 5 weeks ago and came on within 1 day.   There was no preceding event such as heavy lifting or excessive activity.   Pain was more severe in her back initially and was located in the lower lumbar region.    She went to the ED.   MRI showed a small disc herniation at L4-L5 and disc protrusion at L5-S1 but no definite nerve root compression.  MRI of the thoracic spine showed a focus to the right in the spinal cord at T10 concerning for demyelination.  Therefore she was admitted for further evaluation treatment.  The cervical spine MRI showed a small left hemicord focus adjacent to C4C5.  The brain did not show any abnormal foci.  None of the MRIs showed any enhancement.  I personally reviewed the MRIs of the spine and brain and concur with the official results.  MRI of the spine also showed some changes in the lungs concerning for COVID-19.  She tested positive for SARS-COV-2 in the ED.  Her husband also tested positive and he had mild respiratory symptoms but was never hospitalized.  Repeat MRI of the thoracic spine 07/23/2019 showed resolution  of the cervical spine focus.  The MRI of the brain 07/23/2019 remained negative.    REVIEW OF SYSTEMS: Constitutional: No fevers, chills, sweats, or change in appetite Eyes: No visual changes, double vision, eye pain Ear, nose and throat: No hearing loss, ear pain, nasal congestion, sore throat Cardiovascular: No chest pain, palpitations Respiratory: No shortness of breath at rest or with exertion.   No wheezes GastrointestinaI: No nausea, vomiting, diarrhea, abdominal pain, fecal incontinence Genitourinary: No dysuria, urinary retention or frequency.  No nocturia. Musculoskeletal:Back pain.  Degenerative disc changes at L4-L5 and L5-S1.   Integumentary: No rash,  pruritus, skin lesions Neurological: as above Psychiatric: No depression at this time.  No anxiety Endocrine: No palpitations, diaphoresis, change in appetite, change in weigh or increased thirst Hematologic/Lymphatic: No anemia, purpura, petechiae. Allergic/Immunologic: No itchy/runny eyes, nasal congestion, recent allergic reactions, rashes  ALLERGIES: Allergies  Allergen Reactions  . Tylox [Oxycodone-Acetaminophen] Swelling    Caused face to swell.  Patient stated this happened a long time ago and since then has taken oxycodone with no problems    HOME MEDICATIONS:  Current Outpatient Medications:  .  cyclobenzaprine (FLEXERIL) 5 MG tablet, Take one po qd prn, Disp: 90 tablet, Rfl: 4 .  Insulin Glargine (BASAGLAR KWIKPEN) 100 UNIT/ML, Inject 10 Units into the skin daily. With pen needles. Or as directed, Disp: 15 mL, Rfl: 1 .  metFORMIN (GLUCOPHAGE) 500 MG tablet, TAKE 4 TABLETS BY MOUTH EVERY DAY Needs appointment for refills., Disp: 120 tablet, Rfl: 0 .  Olmesartan-amLODIPine-HCTZ 40-5-25 MG TABS, Take 1 tablet by mouth daily., Disp: 90 tablet, Rfl: 0 .  rosuvastatin (CRESTOR) 10 MG tablet, Take 1 tablet (10 mg total) by mouth daily., Disp: 90 tablet, Rfl: 1 .  Semaglutide,0.25 or 0.5MG /DOS, (OZEMPIC, 0.25 OR 0.5 MG/DOSE,) 2 MG/1.5ML SOPN, Inject 0.25 mg once a week x 4 weeks ,then increase to 0.5 mg per weekly, Disp: 1.5 mL, Rfl: 3 .  vitamin C (VITAMIN C) 500 MG tablet, Take 1 tablet (500 mg total) by mouth daily. Please take for 2 weeks, Disp: , Rfl:  .  zinc sulfate 220 (50 Zn) MG capsule, Take 1 capsule (220 mg total) by mouth daily. Please take for 2 weeks, Disp: , Rfl:  .  baclofen (LIORESAL) 10 MG tablet, Take 1 tablet (10 mg total) by mouth daily as needed for muscle spasms., Disp: 90 each, Rfl: 4  PAST MEDICAL HISTORY: Past Medical History:  Diagnosis Date  . Hypertension   . Hypoglycemia   . Radiation 10/21/2015-10/23/2015   Anterior neck area 12 gray in 3 fractions  .  Teratoma     PAST SURGICAL HISTORY: Past Surgical History:  Procedure Laterality Date  . ABDOMINAL HERNIA REPAIR    . ABDOMINAL HYSTERECTOMY    . CESAREAN SECTION    . CRYOTHERAPY  2013   to neck  . DERMOID CYST  EXCISION    . ECTOPIC PREGNANCY SURGERY    . EXCISION MASS NECK Bilateral 10/20/2015   Procedure: EXCISION OF LARGE KELOIDS SEVERE RIGHT AND LEFT NECK WITH PLASTIC RECONSTRUCTION;  Surgeon: Cristine Polio, MD;  Location: [facility];  Service: Plastics;  Laterality: Bilateral;  . HAND SURGERY     amputation of right index finger    FAMILY HISTORY: Family History  Problem Relation Age of Onset  . Hypertension Other   . Hypertension Mother   . Diabetes Mother   . Diabetes Father   . Thyroid disease Father   . Heart failure Father  SOCIAL HISTORY:  Social History   Socioeconomic History  . Marital status: Married    Spouse name: Not on file  . Number of children: 2  . Years of education: Not on file  . Highest education level: Not on file  Occupational History  . Occupation: Therapist, music  Tobacco Use  . Smoking status: Never Smoker  . Smokeless tobacco: Never Used  Vaping Use  . Vaping Use: Never used  Substance and Sexual Activity  . Alcohol use: Yes    Comment: socially  . Drug use: No  . Sexual activity: Yes    Birth control/protection: Surgical  Other Topics Concern  . Not on file  Social History Narrative  . Not on file   Social Determinants of Health   Financial Resource Strain:   . Difficulty of Paying Living Expenses: Not on file  Food Insecurity:   . Worried About Charity fundraiser in the Last Year: Not on file  . Ran Out of Food in the Last Year: Not on file  Transportation Needs:   . Lack of Transportation (Medical): Not on file  . Lack of Transportation (Non-Medical): Not on file  Physical Activity:   . Days of Exercise per Week: Not on file  . Minutes of Exercise per Session: Not on file  Stress:   .  Feeling of Stress : Not on file  Social Connections:   . Frequency of Communication with Friends and Family: Not on file  . Frequency of Social Gatherings with Friends and Family: Not on file  . Attends Religious Services: Not on file  . Active Member of Clubs or Organizations: Not on file  . Attends Archivist Meetings: Not on file  . Marital Status: Not on file  Intimate Partner Violence:   . Fear of Current or Ex-Partner: Not on file  . Emotionally Abused: Not on file  . Physically Abused: Not on file  . Sexually Abused: Not on file     PHYSICAL EXAM PHYSICAL EXAM  Vitals:   07/21/20 1300  BP: (!) 153/99  Pulse: 87  Weight: 255 lb 8 oz (115.9 kg)  Height: 6' (1.829 m)    Body mass index is 34.65 kg/m.   General: The patient is well-developed and well-nourished and in no acute distress.  Head is [place]/AT   Neurologic Exam  Mental status: The patient is alert and oriented x 3 at the time of the examination. The patient has apparent normal recent and remote memory, with an apparently normal attention span and concentration ability.   Speech is normal.  Cranial nerves: Extraocular movements are full. Pupils are equal, round, and reactive to light and accomodation.  Color vision is more symmetric.   Facial symmetry is present. There is good facial sensation to soft touch bilaterally.Facial strength is normal.  Trapezius and sternocleidomastoid strength is normal.     No obvious hearing deficits are noted.  Motor:  Muscle bulk is normal.   Tone is increased in legs, right > left.. Strength is  5 / 5 in all 4 extremities.   Sensory: Intact sensation to touch, vibration in arms but reduced vibration and touch in right leg relative to the left  Coordination: Cerebellar testing reveals good finger-nose-finger and reduced right heel-to-shin bilaterally.  Gait and station: Station is normal.   The gait is mildly wide.  Tandem gait is moderately wide.  Romberg is negative.    Reflexes: Deep tendon reflexes are symmetric and normal bilaterally.  DIAGNOSTIC DATA (LABS, IMAGING, TESTING) - I reviewed patient records, labs, notes, testing and imaging myself where available.  Lab Results  Component Value Date   WBC 7.0 06/17/2020   HGB 13.0 06/17/2020   HCT 39.2 06/17/2020   MCV 89.7 06/17/2020   PLT 266 06/17/2020      Component Value Date/Time   NA 140 06/17/2020 1134   K 4.0 06/17/2020 1134   CL 101 06/17/2020 1134   CO2 29 06/17/2020 1134   GLUCOSE 156 (H) 06/17/2020 1134   BUN 13 06/17/2020 1134   CREATININE 0.77 06/17/2020 1134   CALCIUM 10.1 06/17/2020 1134   PROT 7.4 06/17/2020 1134   ALBUMIN 4.4 07/06/2019 1423   AST 17 06/17/2020 1134   ALT 26 06/17/2020 1134   ALKPHOS 85 07/06/2019 1423   BILITOT 0.7 06/17/2020 1134   GFRNONAA 90 06/17/2020 1134   GFRAA 104 06/17/2020 1134   Lab Results  Component Value Date   CHOL 233 (H) 06/17/2020   HDL 45 (L) 06/17/2020   LDLCALC 157 (H) 06/17/2020   LDLDIRECT 96.0 07/06/2019   TRIG 179 (H) 06/17/2020   CHOLHDL 5.2 (H) 06/17/2020   Lab Results  Component Value Date   HGBA1C 9.0 (H) 06/17/2020    Lab Results  Component Value Date   TSH 1.73 06/17/2020       ASSESSMENT AND PLAN  1. Transverse myelitis ([place])   2. Right leg weakness   3. Numbness   4. Cervical spinal stenosis   5. Gait disturbance      1.  She had transverse myelitis with 2 foci shortly after a COVID-19 infection.  Follow-up MRI showed no new lesions.  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  Multiple sclerosis.   We will need to recheck an MRI of the brain and spine.  We discussed that if any new lesions, then she is more likely to have multiple sclerosis will begin a disease modifying therapy.  Since she has no new symptoms we will check these without contrast. 2.   Continue baclofen and cyclobenzapine up to 3 times a day and if tolerated can increase  further. 3.    Return as needed if there are new or worsening neurologic symptoms.    Bettina Warn A. Felecia Shelling, MD, [facility] 06/20/7261, 0:35 PM Certified in Neurology, Clinical Neurophysiology, Sleep Medicine and Neuroimaging  [facility], [facility] [address], [place], [place] [address] [phone]

## 2020-07-22 ENCOUNTER — Telehealth: Payer: Self-pay | Admitting: Neurology

## 2020-07-22 NOTE — Telephone Encounter (Signed)
[employer] auth: [employer] spoke to Alicia ref # [id number] order sent to GI. They will reach out to the patient to schedule.

## 2020-08-02 ENCOUNTER — Encounter: Payer: Self-pay | Admitting: Internal Medicine

## 2020-08-03 ENCOUNTER — Ambulatory Visit
Admission: EM | Admit: 2020-08-03 | Discharge: 2020-08-03 | Disposition: A | Discharge status: Home / Self Care | Payer: BC Managed Care – PPO | Attending: Family Medicine | Admitting: Family Medicine

## 2020-08-03 ENCOUNTER — Other Ambulatory Visit: Payer: Self-pay

## 2020-08-03 DIAGNOSIS — E119 Type 2 diabetes mellitus without complications: Secondary | ICD-10-CM | POA: Diagnosis not present

## 2020-08-03 DIAGNOSIS — R350 Frequency of micturition: Secondary | ICD-10-CM

## 2020-08-03 LAB — POCT URINALYSIS DIP (MANUAL ENTRY)
Bilirubin, UA: NEGATIVE
Glucose, UA: 250 mg/dL — AB
Ketones, POC UA: NEGATIVE mg/dL
Nitrite, UA: NEGATIVE
Protein Ur, POC: NEGATIVE mg/dL
Spec Grav, UA: >=1.03 — AB (ref 1.010–1.025)
Urobilinogen, UA: 0.2 E.U./dL
pH, UA: 5.5 (ref 5.0–8.0)

## 2020-08-03 MED ORDER — FLUCONAZOLE 150 MG PO TABS: 150.0000 mg | ORAL_TABLET | Freq: Once | ORAL | 0 refills | Status: AC

## 2020-08-03 NOTE — Discharge Instructions (Signed)
Please try the diflucan  We will call with any positives of the urine culture  Please follow up if your symptoms fail to improve.

## 2020-08-03 NOTE — ED Triage Notes (Signed)
Patient states she has had urinary frequency and urgency for 1 week. Pt also complains of rash mainly under her right breat and is concerned it may be related. Pt is aox4 and ambulatory.

## 2020-08-03 NOTE — ED Provider Notes (Signed)
[facility]    CSN: [id number] Arrival date & time: 08/03/20  1044      History   Chief Complaint Chief Complaint  Patient presents with   Urinary Frequency    X 1 week   Rash    X 1 week under breasts    HPI [name] is a 48 y.o. female.   She is presenting with urinary frequency as well as irritation on voids.  She has a recent new diagnosis of diabetes and has been trying to get it under control.  She denies any vaginal discharge.  She denies any fevers or chills.  She has been trying to get her blood sugars under control.  HPI  Past Medical History:  Diagnosis Date   Hypertension    Hypoglycemia    Radiation 10/21/2015-10/23/2015   Anterior neck area 12 gray in 3 fractions   Teratoma     Patient Active Problem List   Diagnosis Date Noted   Cervical spinal stenosis 11/19/2019   Gait disturbance 05/21/2019   Transverse myelitis ([place]) 03/13/2019   Numbness 02/13/2019   Right leg weakness 02/13/2019   Lower extremity weakness 01/16/2019   Other incomplete lesion at T10 level of thoracic spinal cord, initial encounter ([place]) 01/16/2019   Pneumonia due to COVID-19 virus 01/16/2019   Notalgia 08/10/2017   Morbid obesity ([place]) 02/18/2017   OSA (obstructive sleep apnea) 01/24/2017   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   Snoring 10/22/2016   Nocturnal dyspnea/choking   10/22/2016   Keloid of skin 09/25/2015   Essential hypertension, benign 09/27/2014   Teratoma 08/07/2014   Severe hypertension 06/08/2013   HYPERLIPIDEMIA, MILD, WITH LOW HDL 07/23/2009   SHORTNESS OF BREATH 02/11/2009   HYPERTENSION 05/07/2008   KELOID SCAR 05/07/2008   HEADACHE 05/22/2007    Past Surgical History:  Procedure Laterality Date   ABDOMINAL HERNIA REPAIR     ABDOMINAL HYSTERECTOMY     CESAREAN SECTION     CRYOTHERAPY  2013   to neck   DERMOID CYST  EXCISION     ECTOPIC PREGNANCY SURGERY     EXCISION MASS NECK  Bilateral 10/20/2015   Procedure: EXCISION OF LARGE KELOIDS SEVERE RIGHT AND LEFT NECK WITH PLASTIC RECONSTRUCTION;  Surgeon: Cristine Polio, MD;  Location: [place];  Service: Plastics;  Laterality: Bilateral;   HAND SURGERY     amputation of right index finger    OB History   No obstetric history on file.      Home Medications    Prior to Admission medications   Medication Sig Start Date End Date Taking? Authorizing Provider  baclofen (LIORESAL) 10 MG tablet Take 1 tablet (10 mg total) by mouth daily as needed for muscle spasms. 07/21/20  Yes Sater, Nanine Means, MD  cyclobenzaprine (FLEXERIL) 5 MG tablet Take one po qd prn 07/21/20  Yes Sater, Nanine Means, MD  Insulin Glargine (BASAGLAR KWIKPEN) 100 UNIT/ML Inject 10 Units into the skin daily. With pen needles. Or as directed 06/17/20  Yes Panosh, Standley Brooking, MD  metFORMIN (GLUCOPHAGE) 500 MG tablet TAKE 4 TABLETS BY MOUTH EVERY DAY Needs appointment for refills. 06/17/20  Yes Panosh, Standley Brooking, MD  Olmesartan-amLODIPine-HCTZ 40-5-25 MG TABS Take 1 tablet by mouth daily. 06/17/20  Yes Panosh, Standley Brooking, MD  rosuvastatin (CRESTOR) 10 MG tablet Take 1 tablet (10 mg total) by mouth daily. 06/17/20  Yes Panosh, Standley Brooking, MD  vitamin C (VITAMIN C) 500 MG tablet Take 1 tablet (500  mg total) by mouth daily. Please take for 2 weeks 01/21/19  Yes Elgergawy, Silver Huguenin, MD  zinc sulfate 220 (50 Zn) MG capsule Take 1 capsule (220 mg total) by mouth daily. Please take for 2 weeks 01/21/19  Yes Elgergawy, Silver Huguenin, MD  fluconazole (DIFLUCAN) 150 MG tablet Take 1 tablet (150 mg total) by mouth once for 1 dose. 08/03/20 08/03/20  Rosemarie Ax, MD  Semaglutide,0.25 or 0.5MG /DOS, (OZEMPIC, 0.25 OR 0.5 MG/DOSE,) 2 MG/1.5ML SOPN Inject 0.25 mg once a week x 4 weeks ,then increase to 0.5 mg per weekly 06/30/20   Panosh, Standley Brooking, MD    Family History Family History  Problem Relation Age of Onset   Hypertension Other    Hypertension Mother    Diabetes  Mother    Diabetes Father    Thyroid disease Father    Heart failure Father     Social History Social History   Tobacco Use   Smoking status: Never Smoker   Smokeless tobacco: Never Used  Vaping Use   Vaping Use: Never used  Substance Use Topics   Alcohol use: Yes    Comment: socially   Drug use: No     Allergies   Tylox [oxycodone-acetaminophen]   Review of Systems Review of Systems  See HPI  Physical Exam Triage Vital Signs ED Triage Vitals  Enc Vitals Group     BP 08/03/20 1141 129/87     Pulse Rate 08/03/20 1141 91     Resp 08/03/20 1141 17     Temp 08/03/20 1141 98.4 F (36.9 C)     Temp Source 08/03/20 1141 Oral     SpO2 08/03/20 1141 96 %     Weight --      Height --      Head Circumference --      Peak Flow --      Pain Score 08/03/20 1143 4     Pain Loc --      Pain Edu? --      Excl. in [place]? --    No data found.  Updated Vital Signs BP 129/87 (BP Location: Left Arm)    Pulse 91    Temp 98.4 F (36.9 C) (Oral)    Resp 17    SpO2 96%   Visual Acuity Right Eye Distance:   Left Eye Distance:   Bilateral Distance:    Right Eye Near:   Left Eye Near:    Bilateral Near:     Physical Exam Gen: NAD, alert, cooperative with exam, well-appearing ENT: normal lips, normal nasal mucosa,  Eye: normal EOM, normal conjunctiva and lids  Skin: no rashes, no areas of induration  Neuro: normal tone, normal sensation to touch    UC Treatments / Results  Labs (all labs ordered are listed, but only abnormal results are displayed) Labs Reviewed  POCT URINALYSIS DIP (MANUAL ENTRY) - Abnormal; Notable for the following components:      Result Value   Glucose, UA =250 (*)    Spec Grav, UA >=1.030 (*)    Blood, UA trace-intact (*)    Leukocytes, UA Trace (*)    All other components within normal limits  URINE CULTURE    EKG   Radiology No results found.  Procedures Procedures (including critical care time)  Medications Ordered in  UC Medications - No data to display  Initial Impression / Assessment and Plan / UC Course  I have reviewed the triage vital signs and the nursing  notes.  Pertinent labs & imaging results that were available during my care of the patient were reviewed by me and considered in my medical decision making (see chart for details).     Ms. Qualls is a 49 year old female is presenting with urinary frequency likely related to glucosuria.  Provided Diflucan.  Sent urine culture.  Counseled on regulating her blood sugar and on what to eat.  Given indications on follow-up.  Final Clinical Impressions(s) / UC Diagnoses   Final diagnoses:  Urinary frequency  Type 2 diabetes mellitus without complication, without long-term current use of insulin ([place])     Discharge Instructions     Please try the diflucan  We will call with any positives of the urine culture  Please follow up if your symptoms fail to improve.     ED Prescriptions    Medication Sig Dispense Auth. Provider   fluconazole (DIFLUCAN) 150 MG tablet Take 1 tablet (150 mg total) by mouth once for 1 dose. 1 tablet Rosemarie Ax, MD     PDMP not reviewed this encounter.   Rosemarie Ax, MD 08/03/20 [phone]

## 2020-08-06 LAB — URINE CULTURE: Culture: 20000 — AB

## 2020-08-22 ENCOUNTER — Ambulatory Visit
Admission: RE | Admit: 2020-08-22 | Discharge: 2020-08-22 | Disposition: A | Discharge status: Home / Self Care | Payer: BC Managed Care – PPO | Source: Ambulatory Visit | Attending: Neurology | Admitting: Neurology

## 2020-08-22 ENCOUNTER — Other Ambulatory Visit: Payer: Self-pay

## 2020-08-22 DIAGNOSIS — R29898 Other symptoms and signs involving the musculoskeletal system: Secondary | ICD-10-CM

## 2020-08-22 DIAGNOSIS — G373 Acute transverse myelitis in demyelinating disease of central nervous system: Secondary | ICD-10-CM

## 2020-08-22 DIAGNOSIS — R200 Anesthesia of skin: Secondary | ICD-10-CM

## 2020-09-01 ENCOUNTER — Other Ambulatory Visit: Payer: Self-pay

## 2020-09-01 ENCOUNTER — Encounter (HOSPITAL_COMMUNITY): Payer: Self-pay

## 2020-09-01 ENCOUNTER — Ambulatory Visit (HOSPITAL_COMMUNITY)
Admission: EM | Admit: 2020-09-01 | Discharge: 2020-09-01 | Disposition: A | Discharge status: Home / Self Care | Payer: BC Managed Care – PPO | Attending: Emergency Medicine | Admitting: Emergency Medicine

## 2020-09-01 DIAGNOSIS — B3731 Acute candidiasis of vulva and vagina: Secondary | ICD-10-CM

## 2020-09-01 DIAGNOSIS — L03311 Cellulitis of abdominal wall: Secondary | ICD-10-CM

## 2020-09-01 DIAGNOSIS — B373 Candidiasis of vulva and vagina: Secondary | ICD-10-CM | POA: Diagnosis not present

## 2020-09-01 HISTORY — DX: Type 2 diabetes mellitus without complications: E11.9

## 2020-09-01 MED ORDER — FLUCONAZOLE 150 MG PO TABS: 150.0000 mg | ORAL_TABLET | Freq: Once | ORAL | 1 refills | Status: AC

## 2020-09-01 MED ORDER — CEPHALEXIN 500 MG PO CAPS: 1000.0000 mg | ORAL_CAPSULE | Freq: Two times a day (BID) | ORAL | 0 refills | Status: AC

## 2020-09-01 MED ORDER — MICONAZOLE NITRATE 2 % EX CREA: 1.0000 "application " | TOPICAL_CREAM | Freq: Two times a day (BID) | CUTANEOUS | 0 refills | Status: DC

## 2020-09-01 NOTE — ED Provider Notes (Signed)
HPI  SUBJECTIVE:  [name] is a 50 y.o. female who presents with 1 week of vaginal burning, itching, irritation, feeling "raw" along the vulva/inner labia.  Reports dysuria, urgency, frequency.  No odor, discharge, rash, vaginal bleeding.  No cloudy or odorous urine, hematuria.  She was seen in another urgent care with identical symptoms several weeks ago, did not have a UTI.  She was treated with Diflucan, got better, but the symptoms then returned.  States that she has not been sexually active in over 6 months.  No recent antibiotics.  No new soaps.  She tried vagisil anti-itch wipes.  Symptoms are worse with wiping.    She also reports 2 days of intermittent, seconds long right lower quadrant pain.  It has not changed since it started.  No fevers, back pain, nausea, vomiting, anorexia.  No aggravating or alleviating factors.  She wonders if this has anything to do with the Ozempic that she started last week.  She has a past medical history of diabetes and states that her glucose has been running around 1 50-1 60.  Normally is well controlled at 120.  She has a history of hypertension, COVID, remote history of UTI, BV, yeast, status post multiple abdominal surgeries.  No history of MRSA, pyelonephritis, nephrolithiasis, DKA, PID, gonorrhea, chlamydia, HIV, HSV, syphilis, trichomonas.  SAY:[id number], Standley Brooking, MD   Past Medical History:  Diagnosis Date  . Diabetes mellitus without complication ([place])   . Hypertension   . Hypoglycemia   . Radiation 10/21/2015-10/23/2015   Anterior neck area 12 gray in 3 fractions  . Teratoma     Past Surgical History:  Procedure Laterality Date  . ABDOMINAL HERNIA REPAIR    . ABDOMINAL HYSTERECTOMY    . CESAREAN SECTION    . CRYOTHERAPY  2013   to neck  . DERMOID CYST  EXCISION    . ECTOPIC PREGNANCY SURGERY    . EXCISION MASS NECK Bilateral 10/20/2015   Procedure: EXCISION OF LARGE KELOIDS SEVERE RIGHT AND LEFT NECK WITH PLASTIC RECONSTRUCTION;  Surgeon:  Cristine Polio, MD;  Location: [place];  Service: Plastics;  Laterality: Bilateral;  . HAND SURGERY     amputation of right index finger    Family History  Problem Relation Age of Onset  . Hypertension Other   . Hypertension Mother   . Diabetes Mother   . Diabetes Father   . Thyroid disease Father   . Heart failure Father     Social History   Tobacco Use  . Smoking status: Never Smoker  . Smokeless tobacco: Never Used  Vaping Use  . Vaping Use: Never used  Substance Use Topics  . Alcohol use: Yes    Comment: socially  . Drug use: No    No current facility-administered medications for this encounter.  Current Outpatient Medications:  .  cephALEXin (KEFLEX) 500 MG capsule, Take 2 capsules (1,000 mg total) by mouth 2 (two) times daily for 5 days., Disp: 20 capsule, Rfl: 0 .  fluconazole (DIFLUCAN) 150 MG tablet, Take 1 tablet (150 mg total) by mouth once for 1 dose. 1 tab po x 1. May repeat in 72 hours if no improvement, Disp: 2 tablet, Rfl: 1 .  miconazole (MICOTIN) 2 % cream, Apply 1 application topically 2 (two) times daily., Disp: 28.35 g, Rfl: 0 .  baclofen (LIORESAL) 10 MG tablet, Take 1 tablet (10 mg total) by mouth daily as needed for muscle spasms., Disp: 90 each, Rfl: 4 .  cyclobenzaprine (FLEXERIL) 5 MG tablet, Take one po qd prn, Disp: 90 tablet, Rfl: 4 .  Insulin Glargine (BASAGLAR KWIKPEN) 100 UNIT/ML, Inject 10 Units into the skin daily. With pen needles. Or as directed, Disp: 15 mL, Rfl: 1 .  metFORMIN (GLUCOPHAGE) 500 MG tablet, TAKE 4 TABLETS BY MOUTH EVERY DAY Needs appointment for refills., Disp: 120 tablet, Rfl: 0 .  Olmesartan-amLODIPine-HCTZ 40-5-25 MG TABS, Take 1 tablet by mouth daily., Disp: 90 tablet, Rfl: 0 .  rosuvastatin (CRESTOR) 10 MG tablet, Take 1 tablet (10 mg total) by mouth daily., Disp: 90 tablet, Rfl: 1 .  Semaglutide,0.25 or 0.5MG /DOS, (OZEMPIC, 0.25 OR 0.5 MG/DOSE,) 2 MG/1.5ML SOPN, Inject 0.25 mg once a week x 4 weeks  ,then increase to 0.5 mg per weekly, Disp: 1.5 mL, Rfl: 3 .  vitamin C (VITAMIN C) 500 MG tablet, Take 1 tablet (500 mg total) by mouth daily. Please take for 2 weeks, Disp: , Rfl:  .  zinc sulfate 220 (50 Zn) MG capsule, Take 1 capsule (220 mg total) by mouth daily. Please take for 2 weeks, Disp: , Rfl:   Allergies  Allergen Reactions  . Tylox [Oxycodone-Acetaminophen] Swelling    Caused face to swell.  Patient stated this happened a long time ago and since then has taken oxycodone with no problems     ROS  As noted in HPI.   Physical Exam  BP (!) 147/110   Pulse 91   Temp 97.9 F (36.6 C)   Resp 19   SpO2 98%   Constitutional: Well developed, well nourished, no acute distress Eyes:  EOMI, conjunctiva normal bilaterally HENT: Normocephalic, atraumatic,mucus membranes moist Respiratory: Normal inspiratory effort Cardiovascular: Normal rate GI: nondistended soft, nontender.  Multiple keloids.  Active bowel sounds.  No rebound, guarding. skin: 7 x 8 cm area of tender erythema surrounding keloid right lower quadrant.  No induration.  No appreciable fluctuance.  Patient states that this reproduces her abdominal pain.  She has no right lower quadrant tenderness.  Marked area of erythema with a marker. No subcutaneous crepitus.      Musculoskeletal: no deformities Neurologic: Alert & oriented x 3, no focal neuro deficits Psychiatric: Speech and behavior appropriate   ED Course   Medications - No data to display  No orders of the defined types were placed in this encounter.   No results found for this or any previous visit (from the past 24 hour(s)). No results found.  ED Clinical Impression  1. Yeast vaginitis   2. Cellulitis of abdominal wall      ED Assessment/Plan  Patient with 2 issues.  1 vaginal complaints are consistent with a yeast vaginitis.  She states her sugars have been slightly higher than normal, 150 and 160.  She states normal is in the 120s,  however it her glucose has not been higher than that.  She states that she improved with the Diflucan but symptoms return, so suspects recurrent yeast infection.  Offered to do external genital exam, but we have decided to treat this empirically as a yeast infection as I think this is the most likely explanation for her symptoms.  Deferred pelvic exam in the absence of pelvic pain or tenderness.  We will send off BV, yeast, trichomonas.  Deferred testing for gonorrhea and chlamydia as she states that she has not been sexually active in over 6 months.  Considered doing a urinalysis, but given recent negative UTI testing at another urgent care, I do not think that  this is completely necessary today.  Suspect the dysuria is from the yeast vaginitis and the urgency and frequency are from glucosuria.  Discussed this with patient.  We have decided to defer urinalysis today  Home With Diflucan x2, miconazole vaginal cream To apply to vulva  2.  Cellulitis/infected keloid.  No evidence of necrotizing fasciitis at this time.  There does not appear to be anything to drain today.  Will send home with Keflex 1000 g p.o. twice daily for 5 days.  She has no history of MRSA, and there is no purulence.  Warm compresses.  Follow-up with PMD if not getting better,  strict ER return precautions   Discussed labs, MDM, treatment plan, and plan for follow-up with patient. Discussed sn/sx that should prompt return to the ED. patient agrees with plan.   Meds ordered this encounter  Medications  . fluconazole (DIFLUCAN) 150 MG tablet    Sig: Take 1 tablet (150 mg total) by mouth once for 1 dose. 1 tab po x 1. May repeat in 72 hours if no improvement    Dispense:  2 tablet    Refill:  1  . miconazole (MICOTIN) 2 % cream    Sig: Apply 1 application topically 2 (two) times daily.    Dispense:  28.35 g    Refill:  0  . cephALEXin (KEFLEX) 500 MG capsule    Sig: Take 2 capsules (1,000 mg total) by mouth 2 (two) times daily  for 5 days.    Dispense:  20 capsule    Refill:  0    *This clinic note was created using [employer]. Therefore, there may be occasional mistakes despite careful proofreading.   ?    Melynda Ripple, MD 09/02/20 [phone]

## 2020-09-01 NOTE — ED Triage Notes (Signed)
Pt in with c/o vaginal itching and irritation that has been going on for a few weeks today.   Pt has been using vagisil anti-itch wipes with some relief

## 2020-09-01 NOTE — Discharge Instructions (Addendum)
Warm compresses, finish the Keflex, even if you feel better.  Follow-up with your doctor in several days if it is not getting any better, go immediately to the ER if he gets worse.  You are to take 2 doses of the Diflucan.  Once today and another in 72 hours if still symptomatic.  You can use the miconazole cream in the areas of irritation.

## 2020-09-03 ENCOUNTER — Telehealth (HOSPITAL_COMMUNITY): Payer: Self-pay | Admitting: Emergency Medicine

## 2020-09-03 LAB — CERVICOVAGINAL ANCILLARY ONLY
Bacterial Vaginitis (gardnerella): POSITIVE — AB
Candida Glabrata: NEGATIVE
Candida Vaginitis: POSITIVE — AB
Comment: NEGATIVE
Comment: NEGATIVE
Comment: NEGATIVE
Comment: NEGATIVE
Trichomonas: NEGATIVE

## 2020-09-03 MED ORDER — METRONIDAZOLE 500 MG PO TABS: 500.0000 mg | ORAL_TABLET | Freq: Two times a day (BID) | ORAL | 0 refills | Status: DC

## 2020-09-09 ENCOUNTER — Encounter: Payer: Self-pay | Admitting: Internal Medicine

## 2020-09-29 ENCOUNTER — Other Ambulatory Visit: Payer: Self-pay

## 2020-09-30 MED ORDER — OLMESARTAN-AMLODIPINE-HCTZ 40-5-25 MG PO TABS: 1.0000 | ORAL_TABLET | Freq: Every day | ORAL | 0 refills | Status: DC

## 2020-10-17 ENCOUNTER — Ambulatory Visit: Payer: BC Managed Care – PPO | Admitting: Internal Medicine

## 2020-10-19 NOTE — Progress Notes (Deleted)
No chief complaint on file.   HPI: [name] 50 y.o. come in for Chronic disease management   Seen ed for yeast vaginintis  And cellulitis of abd wall  Jan 17 22   Last labs  Done 11 20   No diabetes fu?   ? If never notified  Or doesn't remember    Derm consult large  amount of almost balding on frontal and all top of vertex   No other increase  Hair   Androgenic  Changes  Disc ozempic and sgl2 meds  Stay on metformin  Try to do 2 por day  Although has only tolerated  500 per day  Depending we may add ozempic and stop the insulin.   plan 4 mos rov to make sure  All at goal  She has been out of crestor and recent insulin so  Labs may not be reflective of taking all these meds.   Suspect numbness a combo of original  Neuro lesions  And  Poss even diabetes .  ROS: See pertinent positives and negatives per HPI.  Past Medical History:  Diagnosis Date  . Diabetes mellitus without complication ([place])   . Hypertension   . Hypoglycemia   . Radiation 10/21/2015-10/23/2015   Anterior neck area 12 gray in 3 fractions  . Teratoma     Family History  Problem Relation Age of Onset  . Hypertension Other   . Hypertension Mother   . Diabetes Mother   . Diabetes Father   . Thyroid disease Father   . Heart failure Father     Social History   Socioeconomic History  . Marital status: Married    Spouse name: Not on file  . Number of children: 2  . Years of education: Not on file  . Highest education level: Not on file  Occupational History  . Occupation: Therapist, music  Tobacco Use  . Smoking status: Never Smoker  . Smokeless tobacco: Never Used  Vaping Use  . Vaping Use: Never used  Substance and Sexual Activity  . Alcohol use: Yes    Comment: socially  . Drug use: No  . Sexual activity: Yes    Birth control/protection: Surgical  Other Topics Concern  . Not on file  Social History Narrative  . Not on file   Social Determinants of Health   Financial Resource  Strain: Not on file  Food Insecurity: Not on file  Transportation Needs: Not on file  Physical Activity: Not on file  Stress: Not on file  Social Connections: Not on file    Outpatient Medications Prior to Visit  Medication Sig Dispense Refill  . baclofen (LIORESAL) 10 MG tablet Take 1 tablet (10 mg total) by mouth daily as needed for muscle spasms. 90 each 4  . cyclobenzaprine (FLEXERIL) 5 MG tablet Take one po qd prn 90 tablet 4  . Insulin Glargine (BASAGLAR KWIKPEN) 100 UNIT/ML Inject 10 Units into the skin daily. With pen needles. Or as directed 15 mL 1  . metFORMIN (GLUCOPHAGE) 500 MG tablet TAKE 4 TABLETS BY MOUTH EVERY DAY Needs appointment for refills. 120 tablet 0  . metroNIDAZOLE (FLAGYL) 500 MG tablet Take 1 tablet (500 mg total) by mouth 2 (two) times daily. 14 tablet 0  . miconazole (MICOTIN) 2 % cream Apply 1 application topically 2 (two) times daily. 28.35 g 0  . Olmesartan-amLODIPine-HCTZ 40-5-25 MG TABS Take 1 tablet by mouth daily. 90 tablet 0  . rosuvastatin (CRESTOR) 10 MG tablet  Take 1 tablet (10 mg total) by mouth daily. 90 tablet 1  . Semaglutide,0.25 or 0.5MG /DOS, (OZEMPIC, 0.25 OR 0.5 MG/DOSE,) 2 MG/1.5ML SOPN Inject 0.25 mg once a week x 4 weeks ,then increase to 0.5 mg per weekly 1.5 mL 3  . vitamin C (VITAMIN C) 500 MG tablet Take 1 tablet (500 mg total) by mouth daily. Please take for 2 weeks    . zinc sulfate 220 (50 Zn) MG capsule Take 1 capsule (220 mg total) by mouth daily. Please take for 2 weeks     No facility-administered medications prior to visit.     EXAM:  There were no vitals taken for this visit.  There is no height or weight on file to calculate BMI.  GENERAL: vitals reviewed and listed above, alert, oriented, appears well hydrated and in no acute distress HEENT: atraumatic, conjunctiva  clear, no obvious abnormalities on inspection of external nose and ears OP : no lesion edema or exudate  NECK: no obvious masses on inspection palpation   LUNGS: clear to auscultation bilaterally, no wheezes, rales or rhonchi, good air movement CV: HRRR, no clubbing cyanosis or  peripheral edema nl cap refill  MS: moves all extremities without noticeable focal  abnormality PSYCH: pleasant and cooperative, no obvious depression or anxiety Lab Results  Component Value Date   WBC 7.0 06/17/2020   HGB 13.0 06/17/2020   HCT 39.2 06/17/2020   PLT 266 06/17/2020   GLUCOSE 156 (H) 06/17/2020   CHOL 233 (H) 06/17/2020   TRIG 179 (H) 06/17/2020   HDL 45 (L) 06/17/2020   LDLDIRECT 96.0 07/06/2019   LDLCALC 157 (H) 06/17/2020   ALT 26 06/17/2020   AST 17 06/17/2020   NA 140 06/17/2020   K 4.0 06/17/2020   CL 101 06/17/2020   CREATININE 0.77 06/17/2020   BUN 13 06/17/2020   CO2 29 06/17/2020   TSH 1.73 06/17/2020   HGBA1C 9.0 (H) 06/17/2020   MICROALBUR 1.2 06/17/2020   BP Readings from Last 3 Encounters:  09/01/20 (!) 147/110  08/03/20 129/87  07/21/20 (!) 153/99    ASSESSMENT AND PLAN:  Discussed the following assessment and plan:  No diagnosis found. disjointed fu   Due fu labs a1c lipid  ?test  dhea Dm BP  Tests dhea   -Patient advised to return or notify health care team  if  new concerns arise.  There are no Patient Instructions on file for this visit.   Standley Brooking. Panosh M.D.

## 2020-10-20 ENCOUNTER — Telehealth: Payer: Self-pay | Admitting: Internal Medicine

## 2020-10-20 ENCOUNTER — Ambulatory Visit: Payer: BC Managed Care – PPO | Admitting: Internal Medicine

## 2020-10-20 DIAGNOSIS — I10 Essential (primary) hypertension: Secondary | ICD-10-CM

## 2020-10-20 DIAGNOSIS — E1165 Type 2 diabetes mellitus with hyperglycemia: Secondary | ICD-10-CM

## 2020-10-20 DIAGNOSIS — Z79899 Other long term (current) drug therapy: Secondary | ICD-10-CM

## 2020-10-20 NOTE — Telephone Encounter (Signed)
Left message to return call on home and mobile number

## 2020-10-20 NOTE — Telephone Encounter (Signed)
Patient canceled appointment for today, 10/20/20 at 10:30 am and rescheduled for 11/17/20.   Dr Regis Bill would like Patient called to find out how she is doing, check on her sugar levels, check on blood pressure, and to see if she needs any refills until she comes in 11/17/20.

## 2020-11-02 ENCOUNTER — Ambulatory Visit (HOSPITAL_COMMUNITY)
Admission: EM | Admit: 2020-11-02 | Discharge: 2020-11-02 | Disposition: A | Discharge status: Home / Self Care | Payer: BC Managed Care – PPO | Attending: Medical Oncology | Admitting: Medical Oncology

## 2020-11-02 ENCOUNTER — Other Ambulatory Visit: Payer: Self-pay

## 2020-11-02 ENCOUNTER — Encounter (HOSPITAL_COMMUNITY): Payer: Self-pay | Admitting: Medical Oncology

## 2020-11-02 DIAGNOSIS — J3489 Other specified disorders of nose and nasal sinuses: Secondary | ICD-10-CM | POA: Diagnosis not present

## 2020-11-02 DIAGNOSIS — H9203 Otalgia, bilateral: Secondary | ICD-10-CM | POA: Insufficient documentation

## 2020-11-02 DIAGNOSIS — Z20822 Contact with and (suspected) exposure to covid-19: Secondary | ICD-10-CM | POA: Insufficient documentation

## 2020-11-02 DIAGNOSIS — R519 Headache, unspecified: Secondary | ICD-10-CM | POA: Diagnosis not present

## 2020-11-02 DIAGNOSIS — R059 Cough, unspecified: Secondary | ICD-10-CM | POA: Insufficient documentation

## 2020-11-02 DIAGNOSIS — R0981 Nasal congestion: Secondary | ICD-10-CM | POA: Insufficient documentation

## 2020-11-02 LAB — POC INFLUENZA A AND B ANTIGEN (URGENT CARE ONLY)
Influenza A Ag: NEGATIVE
Influenza B Ag: NEGATIVE

## 2020-11-02 LAB — SARS CORONAVIRUS 2 (TAT 6-24 HRS): SARS Coronavirus 2: NEGATIVE

## 2020-11-02 MED ORDER — FLUTICASONE PROPIONATE 50 MCG/ACT NA SUSP: 2.0000 | Freq: Every day | NASAL | 0 refills | Status: AC

## 2020-11-02 MED ORDER — BENZONATATE 100 MG PO CAPS: 100.0000 mg | ORAL_CAPSULE | Freq: Three times a day (TID) | ORAL | 0 refills | Status: DC

## 2020-11-02 NOTE — ED Notes (Signed)
Flu swab and covid swab in minilab

## 2020-11-02 NOTE — ED Provider Notes (Signed)
[facility]    CSN: [id number] Arrival date & time: 11/02/20  1532      History   Chief Complaint Chief Complaint  Patient presents with  . Headache  . Nasal Congestion  . Otalgia    HPI [name] is a 50 y.o. female.   HPI   Cold Symptoms: Patient reports that since Thursday she has had headache, nasal congestion, ear pain bilaterally mild sore throat and fatigue.  She believes she had a fever yesterday.  Symptoms improved some yesterday after resting all day but she is still having ENT symptoms.  Over-the-counter cough and cold medicine has not been helpful.  No known sick contacts.  No shortness of breath, wheezing, vomiting or diarrhea.  Past Medical History:  Diagnosis Date  . Diabetes mellitus without complication ([place])   . Hypertension   . Hypoglycemia   . Radiation 10/21/2015-10/23/2015   Anterior neck area 12 gray in 3 fractions  . Teratoma     Patient Active Problem List   Diagnosis Date Noted  . Cervical spinal stenosis 11/19/2019  . Gait disturbance 05/21/2019  . Transverse myelitis ([place]) 03/13/2019  . Numbness 02/13/2019  . Right leg weakness 02/13/2019  . Lower extremity weakness 01/16/2019  . Other incomplete lesion at T10 level of thoracic spinal cord, initial encounter ([place]) 01/16/2019  . Pneumonia due to COVID-19 virus 01/16/2019  . Notalgia 08/10/2017  . Morbid obesity ([place]) 02/18/2017  . OSA (obstructive sleep apnea) 01/24/2017  . 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 Snoring 10/22/2016  . Nocturnal dyspnea/choking   10/22/2016  . Keloid of skin 09/25/2015  . Essential hypertension, benign 09/27/2014  . Teratoma 08/07/2014  . Severe hypertension 06/08/2013  . HYPERLIPIDEMIA, MILD, WITH LOW HDL 07/23/2009  . SHORTNESS OF BREATH 02/11/2009  . HYPERTENSION 05/07/2008  . KELOID SCAR 05/07/2008  . HEADACHE 05/22/2007    Past Surgical History:  Procedure Laterality Date  . ABDOMINAL HERNIA REPAIR    . ABDOMINAL  HYSTERECTOMY    . CESAREAN SECTION    . CRYOTHERAPY  2013   to neck  . DERMOID CYST  EXCISION    . ECTOPIC PREGNANCY SURGERY    . EXCISION MASS NECK Bilateral 10/20/2015   Procedure: EXCISION OF LARGE KELOIDS SEVERE RIGHT AND LEFT NECK WITH PLASTIC RECONSTRUCTION;  Surgeon: Cristine Polio, MD;  Location: [place];  Service: Plastics;  Laterality: Bilateral;  . HAND SURGERY     amputation of right index finger    OB History   No obstetric history on file.      Home Medications    Prior to Admission medications   Medication Sig Start Date End Date Taking? Authorizing Provider  baclofen (LIORESAL) 10 MG tablet Take 1 tablet (10 mg total) by mouth daily as needed for muscle spasms. 07/21/20   Sater, Nanine Means, MD  cyclobenzaprine (FLEXERIL) 5 MG tablet Take one po qd prn 07/21/20   Sater, Nanine Means, MD  Insulin Glargine [facility]) 100 UNIT/ML Inject 10 Units into the skin daily. With pen needles. Or as directed 06/17/20   Panosh, Standley Brooking, MD  metFORMIN (GLUCOPHAGE) 500 MG tablet TAKE 4 TABLETS BY MOUTH EVERY DAY Needs appointment for refills. 06/17/20   Panosh, Standley Brooking, MD  metroNIDAZOLE (FLAGYL) 500 MG tablet Take 1 tablet (500 mg total) by mouth 2 (two) times daily. 09/03/20   Lamptey, Myrene Galas, MD  miconazole (MICOTIN) 2 % cream Apply 1 application topically 2 (two) times daily. 09/01/20  Melynda Ripple, MD  Olmesartan-amLODIPine-HCTZ 40-5-25 MG TABS Take 1 tablet by mouth daily. 09/30/20   Panosh, Standley Brooking, MD  rosuvastatin (CRESTOR) 10 MG tablet Take 1 tablet (10 mg total) by mouth daily. 06/17/20   Panosh, Standley Brooking, MD  Semaglutide,0.25 or 0.5MG /DOS, (OZEMPIC, 0.25 OR 0.5 MG/DOSE,) 2 MG/1.5ML SOPN Inject 0.25 mg once a week x 4 weeks ,then increase to 0.5 mg per weekly 06/30/20   Panosh, Standley Brooking, MD  vitamin C (VITAMIN C) 500 MG tablet Take 1 tablet (500 mg total) by mouth daily. Please take for 2 weeks 01/21/19   Elgergawy, Silver Huguenin, MD  zinc sulfate 220 (50 Zn) MG  capsule Take 1 capsule (220 mg total) by mouth daily. Please take for 2 weeks 01/21/19   Elgergawy, Silver Huguenin, MD    Family History Family History  Problem Relation Age of Onset  . Hypertension Other   . Hypertension Mother   . Diabetes Mother   . Diabetes Father   . Thyroid disease Father   . Heart failure Father     Social History Social History   Tobacco Use  . Smoking status: Never Smoker  . Smokeless tobacco: Never Used  Vaping Use  . Vaping Use: Never used  Substance Use Topics  . Alcohol use: Yes    Comment: socially  . Drug use: No     Allergies   Tylox [oxycodone-acetaminophen]   Review of Systems Review of Systems  As stated above in HPI Physical Exam Triage Vital Signs ED Triage Vitals  Enc Vitals Group     BP 11/02/20 1621 132/83     Pulse Rate 11/02/20 1621 95     Resp 11/02/20 1621 12     Temp 11/02/20 1621 98.7 F (37.1 C)     Temp Source 11/02/20 1621 Oral     SpO2 11/02/20 1621 94 %     Weight --      Height --      Head Circumference --      Peak Flow --      Pain Score 11/02/20 1618 5     Pain Loc --      Pain Edu? --      Excl. in [place]? --    No data found.  Updated Vital Signs BP 132/83 (BP Location: Right Arm)   Pulse 95   Temp 98.7 F (37.1 C) (Oral)   Resp 12   SpO2 94%   Physical Exam Vitals and nursing note reviewed.  Constitutional:      General: She is not in acute distress.    Appearance: She is well-developed. She is not ill-appearing, toxic-appearing or diaphoretic.  HENT:     Head: Normocephalic and atraumatic.     Right Ear: Hearing, tympanic membrane, ear canal and external ear normal.     Left Ear: Hearing, tympanic membrane, ear canal and external ear normal.     Nose:     Right Turbinates: Swollen.     Left Turbinates: Swollen.     Right Sinus: No maxillary sinus tenderness or frontal sinus tenderness.     Left Sinus: No maxillary sinus tenderness or frontal sinus tenderness.     Mouth/Throat:     Mouth:  Mucous membranes are moist.     Pharynx: Oropharynx is clear. Uvula midline. Posterior oropharyngeal erythema (mild) present. No uvula swelling.     Tonsils: No tonsillar exudate or tonsillar abscesses.  Eyes:     Extraocular Movements: Extraocular movements intact.  Pupils: Pupils are equal, round, and reactive to light.  Cardiovascular:     Rate and Rhythm: Normal rate and regular rhythm.     Heart sounds: Normal heart sounds.  Pulmonary:     Effort: Pulmonary effort is normal.     Breath sounds: Normal breath sounds.  Musculoskeletal:     Cervical back: Normal range of motion and neck supple.  Lymphadenopathy:     Cervical: No cervical adenopathy.  Skin:    General: Skin is warm.  Neurological:     Mental Status: She is alert and oriented to person, place, and time.      UC Treatments / Results  Labs (all labs ordered are listed, but only abnormal results are displayed) Labs Reviewed - No data to display  EKG   Radiology No results found.  Procedures Procedures (including critical care time)  Medications Ordered in UC Medications - No data to display  Initial Impression / Assessment and Plan / UC Course  I have reviewed the triage vital signs and the nursing notes.  Pertinent labs & imaging results that were available during my care of the patient were reviewed by me and considered in my medical decision making (see chart for details).     New. Likely viral in nature.  Testing her for COVID-19 and influenza.  As she is past timeframe for Tamiflu we will not send this into the pharmacy.  Sending in [place] and Flonase to help with her symptoms.  Discussed how to use.  Discussed return to work precautions.  Encourage rest, hydration with water and over-the-counter medications as needed. Final Clinical Impressions(s) / UC Diagnoses   Final diagnoses:  None   Discharge Instructions   None    ED Prescriptions    None     PDMP not reviewed this  encounter.   Hughie Closs, [place] 11/02/20 1644

## 2020-11-02 NOTE — ED Triage Notes (Signed)
Pt reports Sx's started Thursday. HA, congestion, ear pain Bil.

## 2020-11-17 ENCOUNTER — Other Ambulatory Visit: Payer: Self-pay

## 2020-11-17 ENCOUNTER — Ambulatory Visit (INDEPENDENT_AMBULATORY_CARE_PROVIDER_SITE_OTHER): Payer: BC Managed Care – PPO | Admitting: Internal Medicine

## 2020-11-17 ENCOUNTER — Encounter: Payer: Self-pay | Admitting: Internal Medicine

## 2020-11-17 VITALS — BP 112/68 | HR 84 | Temp 98.4°F | Ht 72.0 in | Wt 247.6 lb

## 2020-11-17 DIAGNOSIS — E1165 Type 2 diabetes mellitus with hyperglycemia: Secondary | ICD-10-CM | POA: Diagnosis not present

## 2020-11-17 DIAGNOSIS — Z79899 Other long term (current) drug therapy: Secondary | ICD-10-CM

## 2020-11-17 DIAGNOSIS — I10 Essential (primary) hypertension: Secondary | ICD-10-CM

## 2020-11-17 DIAGNOSIS — Z794 Long term (current) use of insulin: Secondary | ICD-10-CM | POA: Diagnosis not present

## 2020-11-17 DIAGNOSIS — L910 Hypertrophic scar: Secondary | ICD-10-CM

## 2020-11-17 DIAGNOSIS — R200 Anesthesia of skin: Secondary | ICD-10-CM

## 2020-11-17 DIAGNOSIS — Z800 Family history of malignant neoplasm of digestive organs: Secondary | ICD-10-CM

## 2020-11-17 DIAGNOSIS — L304 Erythema intertrigo: Secondary | ICD-10-CM

## 2020-11-17 DIAGNOSIS — Z1211 Encounter for screening for malignant neoplasm of colon: Secondary | ICD-10-CM

## 2020-11-17 LAB — POCT GLYCOSYLATED HEMOGLOBIN (HGB A1C): Hemoglobin A1C: 8 % — AB (ref 4.0–5.6)

## 2020-11-17 MED ORDER — OZEMPIC (0.25 OR 0.5 MG/DOSE) 2 MG/1.5ML ~~LOC~~ SOPN: PEN_INJECTOR | SUBCUTANEOUS | 3 refills | Status: DC

## 2020-11-17 MED ORDER — BASAGLAR KWIKPEN 100 UNIT/ML ~~LOC~~ SOPN: 10.0000 [IU] | PEN_INJECTOR | Freq: Every day | SUBCUTANEOUS | 5 refills | Status: DC

## 2020-11-17 NOTE — Progress Notes (Signed)
Chief Complaint  Patient presents with  . Follow-up  . Diabetes    Patient wants to discuss neuropathy in her feet.    HPI: [name] 50 y.o. come in for cdm reache out to  Fu on bp and dm   multiple other concerns to be addressed. Feels pretty good  But with exercise right foot feels more numb( had transverse myelitis and poss radicular sxa from covid?)  Since last visit  Has had 3 uc ed visitis urianrly frequency  12 21  No uti but  Glucosuria    Seems that no med  Changes advised  Years vaginitis iwh cellulitis?  1/17 Nasal congestion   3 20 She is coming in for delayed  fu  From  Fall visit  ozempic helped  bg but  Had cec BMs became apprehensive    and so didn't continue but she is taking Metformin.  Was only taking about 5 units of the Basaglar ran out 3 weeks ago bp has been  OK  fam hx colon cancer needs colon screen  Has irritation in the gluteal crease area little itchy irritated not super painful is using A&E ointment please check ROS: See pertinent positives and negatives per HPI.  Past Medical History:  Diagnosis Date  . Diabetes mellitus without complication ([name])   . Hypertension   . Hypoglycemia   . Radiation 10/21/2015-10/23/2015   Anterior neck area 12 gray in 3 fractions  . Teratoma     Family History  Problem Relation Age of Onset  . Hypertension Other   . Hypertension Mother   . Diabetes Mother   . Diabetes Father   . Thyroid disease Father   . Heart failure Father     Social History   Socioeconomic History  . Marital status: Married    Spouse name: Not on file  . Number of children: 2  . Years of education: Not on file  . Highest education level: Not on file  Occupational History  . Occupation: Therapist, music  Tobacco Use  . Smoking status: Never Smoker  . Smokeless tobacco: Never Used  Vaping Use  . Vaping Use: Never used  Substance and Sexual Activity  . Alcohol use: Yes    Comment: socially  . Drug use: No  . Sexual  activity: Yes    Birth control/protection: Surgical  Other Topics Concern  . Not on file  Social History Narrative  . Not on file   Social Determinants of Health   Financial Resource Strain: Not on file  Food Insecurity: Not on file  Transportation Needs: Not on file  Physical Activity: Not on file  Stress: Not on file  Social Connections: Not on file    Outpatient Medications Prior to Visit  Medication Sig Dispense Refill  . baclofen (LIORESAL) 10 MG tablet Take 1 tablet (10 mg total) by mouth daily as needed for muscle spasms. 90 each 4  . benzonatate (TESSALON) 100 MG capsule Take 1 capsule (100 mg total) by mouth every 8 (eight) hours. 21 capsule 0  . cyclobenzaprine (FLEXERIL) 5 MG tablet Take one po qd prn 90 tablet 4  . fluticasone (FLONASE) 50 MCG/ACT nasal spray Place 2 sprays into both nostrils daily. 16 mL 0  . metFORMIN (GLUCOPHAGE) 500 MG tablet TAKE 4 TABLETS BY MOUTH EVERY DAY Needs appointment for refills. 120 tablet 0  . Olmesartan-amLODIPine-HCTZ 40-5-25 MG TABS Take 1 tablet by mouth daily. 90 tablet 0  . rosuvastatin (CRESTOR) 10 MG  tablet Take 1 tablet (10 mg total) by mouth daily. 90 tablet 1  . vitamin C (VITAMIN C) 500 MG tablet Take 1 tablet (500 mg total) by mouth daily. Please take for 2 weeks    . zinc sulfate 220 (50 Zn) MG capsule Take 1 capsule (220 mg total) by mouth daily. Please take for 2 weeks    . Insulin Glargine (BASAGLAR KWIKPEN) 100 UNIT/ML Inject 10 Units into the skin daily. With pen needles. Or as directed 15 mL 1  . Semaglutide,0.25 or 0.5MG /DOS, (OZEMPIC, 0.25 OR 0.5 MG/DOSE,) 2 MG/1.5ML SOPN Inject 0.25 mg once a week x 4 weeks ,then increase to 0.5 mg per weekly 1.5 mL 3  . metroNIDAZOLE (FLAGYL) 500 MG tablet Take 1 tablet (500 mg total) by mouth 2 (two) times daily. 14 tablet 0  . miconazole (MICOTIN) 2 % cream Apply 1 application topically 2 (two) times daily. 28.35 g 0   No facility-administered medications prior to visit.      EXAM:  BP 112/68 (BP Location: Right Arm, Patient Position: Sitting, Cuff Size: Large)   Pulse 84   Temp 98.4 F (36.9 C) (Oral)   Ht 6' (1.829 m)   Wt 247 lb 9.6 oz (112.3 kg)   SpO2 96%   BMI 33.58 kg/m   Body mass index is 33.58 kg/m.  GENERAL: vitals reviewed and listed above, alert, oriented, appears well hydrated and in no acute distress HEENT: atraumatic, conjunctiva  clear, no obvious abnormalities on inspection of external nose and ears OP : masked   NECK: no obvious masses on inspection palpation  LUNGS: clear to auscultation bilaterally, no wheezes, rales or rhonchi, good air movement CV: HRRR, no clubbing cyanosis or  peripheral edema nl cap refill  MS: moves all extremities   Mild favoring right leg  No lesions obvious  Skin inc keloid neck    PSYCH: pleasant and cooperative, no obvious depression or anxiety Lab Results  Component Value Date   WBC 7.0 06/17/2020   HGB 13.0 06/17/2020   HCT 39.2 06/17/2020   PLT 266 06/17/2020   GLUCOSE 156 (H) 06/17/2020   CHOL 233 (H) 06/17/2020   TRIG 179 (H) 06/17/2020   HDL 45 (L) 06/17/2020   LDLDIRECT 96.0 07/06/2019   LDLCALC 157 (H) 06/17/2020   ALT 26 06/17/2020   AST 17 06/17/2020   NA 140 06/17/2020   K 4.0 06/17/2020   CL 101 06/17/2020   CREATININE 0.77 06/17/2020   BUN 13 06/17/2020   CO2 29 06/17/2020   TSH 1.73 06/17/2020   HGBA1C 8.0 (A) 11/17/2020   MICROALBUR 1.2 06/17/2020   BP Readings from Last 3 Encounters:  11/17/20 112/68  11/02/20 132/83  09/01/20 (!) 147/110    ASSESSMENT AND PLAN:  Discussed the following assessment and plan:  Type 2 diabetes mellitus with hyperglycemia, with long-term current use of insulin ([place]) - Plan: POCT glycosylated hemoglobin (Hb A1C)  Medication management  Essential hypertension  Colon cancer screening - Plan: Ambulatory referral to Gastroenterology  Numbness  Keloid scar  Scarring, keloid severe recurrent  - very strong family hx asks  about genetic testing  will refer   Intertrigo ?  - Luteal area try Monistat uncertain cause may be different diagnosis has ill-defined hope hypopigmentation in this area Diabetes still not controlled stop the Ozempic because of concern for decrease bowel movements discussed options using a lower dose or Trulicity she does not feel she is a candidate for SGLT2 because of her vaginitis  symptoms.  Although did discuss that out-of-control diabetes is more likely to give that problem.  We discussed blood sugar goals and importance of such.  Her hemoglobin A1c went down from 9-8 although it might have been better when she was on the Ozempic.  This follow-up was delayed from previous samples of Ozempic given in a printed prescription to look for coupons she could try taking the Ozempic every 10-14 days if needed Also refill the Basaglar.  And to continue the Metformin. Also she is concerned about genetic tendency for these keloids which are seem to be extensive occurring in her son and multiple family members. Gust options seeing dermatology etc. but they seem to be more focused on treatment We will refer to genetic counseling look for possible markers that would help delineate in her case. In regard to her numbness of her foot after she exercises suspected is residual but also controlling diabetes is important and she can follow-up with the neurologist. -Patient advised to return or notify health care team  if  new concerns arise. Record reviewed time of visit plan and orders 50 minutes. Patient Instructions  We can try trulicity   Or  Take the ozempic  Every 10 - 14 days to see if no se  Or take the lower dose 0.25   Continue exercise and healthy eating (The other idea would be to try  jardiance or farxiga   A pill  Daily  That  Helps  expel sugar  through the urine. )  Insulin is used every day  .    We can see genetic counseling   Will do a referral.   Try otc monisotat  On the   Gluteal crease if   persistent or progressive we may need to see the dermatologist .   Plan  ROV in 3 months to check BG  Control   Goal is below 7   Eventually .   bg control will help avoid excess infections .and avoid complicaitons .        Standley Brooking. Jamerson Vonbargen M.D.

## 2020-11-17 NOTE — Patient Instructions (Addendum)
We can try trulicity   Or  Take the ozempic  Every 10 - 14 days to see if no se  Or take the lower dose 0.25   Continue exercise and healthy eating (The other idea would be to try  jardiance or farxiga   A pill  Daily  That  Helps  expel sugar  through the urine. )  Insulin is used every day  .    We can see genetic counseling   Will do a referral.   Try otc monisotat  On the   Gluteal crease if  persistent or progressive we may need to see the dermatologist .   Plan  ROV in 3 months to check BG  Control   Goal is below 7   Eventually .   bg control will help avoid excess infections .and avoid complicaitons .

## 2020-11-21 ENCOUNTER — Telehealth: Payer: Self-pay | Admitting: Licensed Clinical Social Worker

## 2020-11-21 NOTE — Telephone Encounter (Signed)
Received a genetic counseling referral from Dr. Regis Bill for fhx of colon cancer. Ms. Dittmer has been cld and schedule to see Brianna on 4/11 at 2pm. Pt aware to arrive 15 minutes early.

## 2020-11-24 ENCOUNTER — Inpatient Hospital Stay: Payer: BC Managed Care – PPO

## 2020-11-24 ENCOUNTER — Inpatient Hospital Stay: Payer: BC Managed Care – PPO | Attending: Genetic Counselor | Admitting: Licensed Clinical Social Worker

## 2020-11-24 ENCOUNTER — Encounter: Payer: Self-pay | Admitting: Licensed Clinical Social Worker

## 2020-11-24 ENCOUNTER — Other Ambulatory Visit: Payer: Self-pay

## 2020-11-24 DIAGNOSIS — Z800 Family history of malignant neoplasm of digestive organs: Secondary | ICD-10-CM | POA: Diagnosis not present

## 2020-11-24 DIAGNOSIS — Z803 Family history of malignant neoplasm of breast: Secondary | ICD-10-CM | POA: Diagnosis not present

## 2020-11-24 LAB — GENETIC SCREENING ORDER

## 2020-11-24 NOTE — Progress Notes (Addendum)
REFERRING PROVIDER: Burnis Medin, MD [place],  [place] [address]  PRIMARY PROVIDER:  Burnis Medin, MD  PRIMARY REASON FOR VISIT:  1. Family history of breast cancer   2. Family history of colon cancer      HISTORY OF PRESENT ILLNESS:   [name], a 50 y.o. female, was seen for a [facility] cancer genetics consultation at the request of Dr. Regis Bill due to a family history of cancer and personal history of keloids.  [name] presents to clinic today to discuss the possibility of a hereditary predisposition to cancer, genetic testing, and to further clarify her future cancer risks, as well as potential cancer risks for family members.    [name] is a 50 y.o. female with no personal history of cancer.  She does have many keloids and notes a strong family history of keloids and is interested in any genetic testing there may be for this condition. While we do not have testing here for this, we will look further into any testing options and update [name].   CANCER HISTORY:  Oncology History   No history exists.     RISK FACTORS:  Menarche was at age 15.  First live birth at age 41.  OCP use for approximately 7 years.  Ovaries intact: no.  Hysterectomy: yes.  Menopausal status: postmenopausal.  HRT use: 0 years. Colonoscopy: no; not examined. Mammogram within the last year: no. Number of breast biopsies: 0.   Past Medical History:  Diagnosis Date  . Diabetes mellitus without complication ([place])   . Family history of breast cancer   . Family history of colon cancer   . Hypertension   . Hypoglycemia   . Radiation 10/21/2015-10/23/2015   Anterior neck area 12 gray in 3 fractions  . Teratoma     Past Surgical History:  Procedure Laterality Date  . ABDOMINAL HERNIA REPAIR    . ABDOMINAL HYSTERECTOMY    . CESAREAN SECTION    . CRYOTHERAPY  2013   to neck  . DERMOID CYST  EXCISION    . ECTOPIC PREGNANCY SURGERY    . EXCISION MASS NECK Bilateral  10/20/2015   Procedure: EXCISION OF LARGE KELOIDS SEVERE RIGHT AND LEFT NECK WITH PLASTIC RECONSTRUCTION;  Surgeon: Cristine Polio, MD;  Location: [facility];  Service: Plastics;  Laterality: Bilateral;  . HAND SURGERY     amputation of right index finger    Social History   Socioeconomic History  . Marital status: Married    Spouse name: Not on file  . Number of children: 2  . Years of education: Not on file  . Highest education level: Not on file  Occupational History  . Occupation: Therapist, music  Tobacco Use  . Smoking status: Never Smoker  . Smokeless tobacco: Never Used  Vaping Use  . Vaping Use: Never used  Substance and Sexual Activity  . Alcohol use: Yes    Comment: socially  . Drug use: No  . Sexual activity: Yes    Birth control/protection: Surgical  Other Topics Concern  . Not on file  Social History Narrative  . Not on file   Social Determinants of Health   Financial Resource Strain: Not on file  Food Insecurity: Not on file  Transportation Needs: Not on file  Physical Activity: Not on file  Stress: Not on file  Social Connections: Not on file     FAMILY HISTORY:  We obtained a detailed, 4-generation family  history.  Significant diagnoses are listed below: Family History  Problem Relation Age of Onset  . Hypertension Other   . Hypertension Mother   . Diabetes Mother   . Diabetes Father   . Thyroid disease Father   . Heart failure Father   . Breast cancer Maternal Aunt        dx 50?  Marland Kitchen Colon cancer Maternal Grandmother 71   [name] had a son and a daughter, no cancers but she does note her son has a keloid on his ear.  [name] mother is living at 58, no cancers. Patient has 2 maternal uncles, 2 maternal aunts. An aunt had breast cancer and bilatermal mastectomies at 31 and had genetic testing, unsure if it was positive or negative.  Maternal grandmother died of colon cancer at 71. This grandmother had a sister who had a  daughter with breast cancer in her 68s, and her daughter had breast cancer at 77. Maternal grandfather died in his 26s. One of his sister's had 2 daughters that had breast cancer, one in her 50s and one in her 37s, and a son with an unknown cancer.  Ms. Storts father is living at 7, no history of cancer. Patient has 2 paternal uncles that are deceased. Limited information about this side, but no known cancers.  Ms. Zuk is aware of previous family history of genetic testing for hereditary cancer risks. Patient's maternal ancestors are of [place] and [place] descent, and paternal ancestors are of [place] and Caucasian descent. There is no reported Ashkenazi Jewish ancestry. There is no known consanguinity.    GENETIC COUNSELING ASSESSMENT: [name] is a 50 y.o. female with a family history of breast cancer which is somewhat suggestive of a hereditary cancer syndrome and predisposition to cancer. We, therefore, discussed and recommended the following at today's visit.   DISCUSSION: We discussed that approximately 5-10% of breast cancer is hereditary  Most cases of hereditary breast cancer are associated with BRCA1/BRCA2 genes, although there are other genes associated with hereditary breast cancer as well. While the breast cancer in her family is somewhat removed from her, it is still possible she inherited an increased risk, especially if her aunt did test positive for something. We discussed that testing is beneficial for several reasons including  knowing about other cancer risks, identifying potential screening and risk-reduction options that may be appropriate, and to understand if other family members could be at risk for cancer and allow them to undergo genetic testing.   We reviewed the characteristics, features and inheritance patterns of hereditary cancer syndromes. We also discussed genetic testing, including the appropriate family members to test, the process of testing, insurance  coverage and turn-around-time for results. We discussed the implications of a negative, positive and/or variant of uncertain significant result. We recommended [name] pursue genetic testing for the [facility] CancerNext-Expanded+RNA gene panel.   The CancerNext-Expanded + RNAinsight gene panel offered by [facility] and includes sequencing and rearrangement analysis for the following 77 genes: IP, ALK, APC*, ATM*, AXIN2, BAP1, BARD1, BLM, BMPR1A, BRCA1*, BRCA2*, BRIP1*, CDC73, CDH1*,CDK4, CDKN1B, CDKN2A, CHEK2*, CTNNA1, DICER1, FANCC, FH, FLCN, GALNT12, KIF1B, LZTR1, MAX, MEN1, MET, MLH1*, MSH2*, MSH3, MSH6*, MUTYH*, NBN, NF1*, NF2, NTHL1, PALB2*, PHOX2B, PMS2*, POT1, PRKAR1A, PTCH1, PTEN*, RAD51C*, RAD51D*,RB1, RECQL, RET, SDHA, SDHAF2, SDHB, SDHC, SDHD, SMAD4, SMARCA4, SMARCB1, SMARCE1, STK11, SUFU, TMEM127, TP53*,TSC1, TSC2, VHL and XRCC2 (sequencing and deletion/duplication); EGFR, EGLN1, HOXB13, KIT, MITF, PDGFRA, POLD1 and POLE (sequencing only); EPCAM and GREM1 (deletion/duplication  only).  Based on [name]'s family history of cancer, she meets medical criteria for genetic testing. Despite that she meets criteria, she may still have an out of pocket cost. We discussed that if her out of pocket cost for testing is over $100, the laboratory will call and confirm whether she wants to proceed with testing.  If the out of pocket cost of testing is less than $100 she will be billed by the genetic testing laboratory.   PLAN: After considering the risks, benefits, and limitations, [name] provided informed consent to pursue genetic testing and the blood sample was sent to [facility] for analysis of the CancerNext-Expanded+RNA panel. Results should be available within approximately 2-3 weeks' time, at which point they will be disclosed by telephone to [name], as will any additional recommendations warranted by these results. [name] will receive a summary of her genetic counseling visit  and a copy of her results once available. This information will also be available in Epic.   Ms. Bartley questions were answered to her satisfaction today. Our contact information was provided should additional questions or concerns arise. Thank you for the referral and allowing [place] to share in the care of your patient.   Faith Rogue, MS, [facility] Genetic Counselor [email or web address] Phone: [phone]  The patient was seen for a total of 40 minutes in face-to-face genetic counseling.  [employer] intern Lorenza Burton was also present. Dr. Grayland Ormond was available for discussion regarding this case.   _______________________________________________________________________ For Office Staff:  Number of people involved in session: 2 Was an Intern/ student involved with case: yes

## 2020-12-03 ENCOUNTER — Encounter: Payer: Self-pay | Admitting: Gastroenterology

## 2020-12-15 ENCOUNTER — Telehealth: Payer: Self-pay | Admitting: Licensed Clinical Social Worker

## 2020-12-17 ENCOUNTER — Encounter: Payer: Self-pay | Admitting: Licensed Clinical Social Worker

## 2020-12-17 ENCOUNTER — Ambulatory Visit: Payer: Self-pay | Admitting: Licensed Clinical Social Worker

## 2020-12-17 DIAGNOSIS — Z1379 Encounter for other screening for genetic and chromosomal anomalies: Secondary | ICD-10-CM

## 2020-12-17 DIAGNOSIS — Z803 Family history of malignant neoplasm of breast: Secondary | ICD-10-CM

## 2020-12-17 DIAGNOSIS — Z800 Family history of malignant neoplasm of digestive organs: Secondary | ICD-10-CM

## 2020-12-17 NOTE — Telephone Encounter (Signed)
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  Revealed that a VUS in ATM, MSH3 and RET were identified. This result means we have not identified a hereditary cause for the family history of cancer.  It is unlikely that there is an increased risk of cancer due to a mutation in one of these genes.  However, genetic testing is not perfect, and cannot definitively rule out a hereditary cause.  It will be important for her to keep in contact with genetics to learn if any additional testing may be needed in the future.

## 2020-12-17 NOTE — Progress Notes (Signed)
HPI:  Ms. Godfrey was previously seen in the [place] clinic due to a family history of cancer and concerns regarding a hereditary predisposition to cancer. Please refer to our prior cancer genetics clinic note for more information regarding our discussion, assessment and recommendations, at the time. Ms. Mcdougald recent genetic test results were disclosed to her, as were recommendations warranted by these results. These results and recommendations are discussed in more detail below.  CANCER HISTORY:  Oncology History   No history exists.    FAMILY HISTORY:  We obtained a detailed, 4-generation family history.  Significant diagnoses are listed below: Family History  Problem Relation Age of Onset  . Hypertension Other   . Hypertension Mother   . Diabetes Mother   . Diabetes Father   . Thyroid disease Father   . Heart failure Father   . Breast cancer Maternal Aunt        dx 50?  Marland Kitchen Colon cancer Maternal Grandmother 41    Ms. Overturf had a son and a daughter, no cancers but she does note her son has a keloid on his ear.  Ms. Rollo mother is living at 2, no cancers. Patient has 2 maternal uncles, 2 maternal aunts. An aunt had breast cancer and bilatermal mastectomies at 31 and had genetic testing, unsure if it was positive or negative.  Maternal grandmother died of colon cancer at 20. This grandmother had a sister who had a daughter with breast cancer in her 42s, and her daughter had breast cancer at 24. Maternal grandfather died in his 60s. One of his sister's had 2 daughters that had breast cancer, one in her 27s and one in her 42s, and a son with an unknown cancer.  Ms. Zettlemoyer father is living at 19, no history of cancer. Patient has 2 paternal uncles that are deceased. Limited information about this side, but no known cancers.  Ms. Bencomo is aware of previous family history of genetic testing for hereditary cancer risks. Patient's maternal ancestors are of [place]  and [place] descent, and paternal ancestors are of [place] and Caucasian descent. There is no reported Ashkenazi Jewish ancestry. There is no known consanguinity.    GENETIC TEST RESULTS: Genetic testing reported out on 12/11/2020 through the [facility] CancerNext-Expanded+RNA cancer panel found a single, pathogenic variant in MUTYH called c.[id number]. Remainder of testing was negative/normal.   The CancerNext-Expanded + RNAinsight gene panel offered by [employer] and includes sequencing and rearrangement analysis for the following 77 genes: IP, ALK, APC*, ATM*, AXIN2, BAP1, BARD1, BLM, BMPR1A, BRCA1*, BRCA2*, BRIP1*, CDC73, CDH1*,CDK4, CDKN1B, CDKN2A, CHEK2*, CTNNA1, DICER1, FANCC, FH, FLCN, GALNT12, KIF1B, LZTR1, MAX, MEN1, MET, MLH1*, MSH2*, MSH3, MSH6*, MUTYH*, NBN, NF1*, NF2, NTHL1, PALB2*, PHOX2B, PMS2*, POT1, PRKAR1A, PTCH1, PTEN*, RAD51C*, RAD51D*,RB1, RECQL, RET, SDHA, SDHAF2, SDHB, SDHC, SDHD, SMAD4, SMARCA4, SMARCB1, SMARCE1, STK11, SUFU, TMEM127, TP53*,TSC1, TSC2, VHL and XRCC2 (sequencing and deletion/duplication); EGFR, EGLN1, HOXB13, KIT, MITF, PDGFRA, POLD1 and POLE (sequencing only); EPCAM and GREM1 (deletion/duplication only).   The test report has been scanned into EPIC and is located under the Molecular Pathology section of the Results Review tab.  A portion of the result report is included below for reference.     We discussed with Ms. Matthes that because current genetic testing is not perfect, it is possible there may be a gene mutation in one of these genes that current testing cannot detect, but that chance is small.  We also discussed, that there could be another gene  that has not yet been discovered, or that we have not yet tested, that is responsible for the cancer diagnoses in the family. It is also possible there is a hereditary cause for the cancer in the family that Ms. Guzzo did not inherit and therefore was not identified in her testing.  Therefore, it is important to  remain in touch with cancer genetics in the future so that we can continue to offer Ms. Casten the most up to date genetic testing.   Genetic testing did identify 3 Variants of uncertain significance (VUS) - one in the ATM gene, a second in the MSH3 gene, and a third in the RET gene.  At this time, it is unknown if these variants are associated with increased cancer risk or if they are normal findings, but most variants such as these get reclassified to being inconsequential. They should not be used to make medical management decisions. With time, we suspect the lab will determine the significance of these variants, if any. If we do learn more about them, we will try to contact Ms. Mick to discuss it further. However, it is important to stay in touch with [place] periodically and keep the address and phone number up to date.  MUTYH-Associated Polyposis Carrier (MAP)    MAP occurs in individuals with two pathogenic variants in MUTYH--one in each copy of their MUTYH genes. Individuals with a single MUTYH pathogenic variant (such as Ms. Carawan) are not affected with MAP. Several studies have indicated that the presence of a single pathogenic variant may modify lifetime cancer risks. The risk for colon cancer is not well defined, but large population studies have demonstrated that the risk is modestly increased over the general population (PMID: 74081448).   Individuals with a single pathogenic variant in the MUTYH gene are considered carriers of MAP. If only one parent is an unaffected carrier, he or she has a 50% risk of passing the pathogenic variant on to each child. If both parents are unaffected carriers, each of their children has a 1 in 4, or 25%, chance of having MAP.  If an individual is unaffected by colon cancer and has no family history of colon cancer, data are uncertain regarding whether specialized screening is warranted. Additionally, there are no specific data to determine screening  recommendations for a patient with MUTYH heterozygous pathogenic variant and a second-degree relative affected with colon cancer, as is the case for Ms. Bashore.   An individual's cancer risk and medical management are not determined by genetic test results alone. Overall cancer risk assessment incorporates additional factors, including personal medical history, family history, and any available genetic information that may result in a personalized plan for cancer prevention and surveillance.  Knowing if pathogenic variants in MUTYH are present is advantageous. At-risk relatives can be identified, enabling pursuit of a diagnostic evaluation. Furthermore, the available information regarding hereditary cancer susceptibility genes is constantly evolving and more clinically relevant data regarding MUTYH are likely to become available in the near future. Awareness of this cancer predisposition encourages patients and their providers to inform at-risk family members, to diligently follow recommended screening protocols, and to be vigilant in maintaining close and regular contact with their local genetics clinic in anticipation of new information.    We recommend all of Ms. Gautreau's family members have genetic counseling and genetic testing. Because the MUTYH pathogenic variant has been identified in her we can offer genetic testing to relatives to determine if they also carry the MUTYH familial  mutation.  As discussed above, having only 1 MUTYH mutation would not significantly impact their colon cancer risk/screening. However, if any relatives have inherited 2 MUTYH mutations (in trans), this would significantly impact their cancer risk.  About 1-2% of the [place] population carries a single MUTYH pathogenic variant, so there is a chance some relatives may have inherited MUTYH mutations from both of their parents.  We would be happy to meet with any of the family members or refer them to a genetic counselor  in their local area.  To locate genetic counselors in other cities, individuals can visit the website of the [employer] (ArtistMovie.se) and Secretary/administrator for a Social worker by zip code.  ADDITIONAL GENETIC TESTING: We discussed with Ms. Rana that her genetic testing was fairly extensive.  If there are genes identified to increase cancer risk that can be analyzed in the future, we would be happy to discuss and coordinate this testing at that time.    CANCER SCREENING RECOMMENDATIONS: This test result means that we have not identified a hereditary cause for Ms. Snell's family history of cancer at this time.  While reassuring, this does not definitively rule out a hereditary predisposition to cancer. It is still possible that there could be genetic mutations that are undetectable by current technology. There could be genetic mutations in genes that have not been tested or identified to increase cancer risk.  Therefore, it is recommended she continue to follow the cancer management and screening guidelines provided by her  primary healthcare provider.   An individual's cancer risk and medical management are not determined by genetic test results alone. Overall cancer risk assessment incorporates additional factors, including personal medical history, family history, and any available genetic information that may result in a personalized plan for cancer prevention and surveillance.   Based on Ms. Suire's personal and family history of cancer as well as her genetic test results, risk model Harriett Rush was used to estimate her risk of developing breast cancer. This estimates her lifetime risk of developing breast cancer to be approximately 10%.  The patient's lifetime breast cancer risk is a preliminary estimate based on available information using one of several models endorsed by the [place] (ACS). The ACS recommends consideration of breast MRI screening as an adjunct to  mammography for patients at high risk (defined as 20% or greater lifetime risk).     RECOMMENDATIONS FOR FAMILY MEMBERS:  Relatives in this family might be at some increased risk of developing cancer, over the general population risk, simply due to the family history of cancer.  We recommended female relatives in this family have a yearly mammogram beginning at age 22, or 64 years younger than the earliest onset of cancer, an annual clinical breast exam, and perform monthly breast self-exams. Female relatives in this family should also have a gynecological exam as recommended by their primary provider.  All family members should be referred for colonoscopy starting at age 57.   It is also possible there is a hereditary cause for the breast cancer in Ms. Litle's family that she did not inherit and therefore was not identified in her.  Based on Ms. Bolte's family history, we recommended maternal relatives have genetic counseling and testing based on the family history of breast cancer in addition to the MUTYH mutation. Ms. Rochefort will let us know if we can be of any assistance in coordinating genetic counseling and/or testing for these family members.  FOLLOW-UP: Lastly, we  discussed with Ms. Dancy that cancer genetics is a rapidly advancing field and it is possible that new genetic tests will be appropriate for her and/or her family members in the future. We encouraged her to remain in contact with cancer genetics on an annual basis so we can update her personal and family histories and let her know of advances in cancer genetics that may benefit this family.   Our contact number was provided. Ms. Kareem questions were answered to her satisfaction, and she knows she is welcome to call us at anytime with additional questions or concerns.   Faith Rogue, MS, [facility] Genetic Counselor [email or web address] Phone: [phone]

## 2021-01-19 ENCOUNTER — Encounter: Payer: Self-pay | Admitting: Neurology

## 2021-01-19 ENCOUNTER — Ambulatory Visit (INDEPENDENT_AMBULATORY_CARE_PROVIDER_SITE_OTHER): Payer: BC Managed Care – PPO | Admitting: Neurology

## 2021-01-19 VITALS — BP 123/78 | HR 88 | Ht 65.0 in | Wt 249.5 lb

## 2021-01-19 DIAGNOSIS — R200 Anesthesia of skin: Secondary | ICD-10-CM | POA: Diagnosis not present

## 2021-01-19 DIAGNOSIS — G373 Acute transverse myelitis in demyelinating disease of central nervous system: Secondary | ICD-10-CM

## 2021-01-19 DIAGNOSIS — R269 Unspecified abnormalities of gait and mobility: Secondary | ICD-10-CM

## 2021-01-19 DIAGNOSIS — R29898 Other symptoms and signs involving the musculoskeletal system: Secondary | ICD-10-CM

## 2021-01-19 MED ORDER — BACLOFEN 10 MG PO TABS: 10.0000 mg | ORAL_TABLET | Freq: Every day | ORAL | 4 refills | Status: DC | PRN

## 2021-01-19 MED ORDER — CYCLOBENZAPRINE HCL 5 MG PO TABS: ORAL_TABLET | ORAL | 4 refills | Status: DC

## 2021-01-19 NOTE — Telephone Encounter (Signed)
Set her up to see Dr. Regis Bill about this when she gets back

## 2021-01-19 NOTE — Progress Notes (Signed)
[facility]  PATIENT: [name] DOB: 1971-03-27  REFERRING DOCTOR OR PCP: Shanon Ace MD SOURCE: Patient, notes from primary care, notes from emergency rooms and hospital stay 01/16/2019 through 01/19/2019 and lab work over the last month, multiple MRI reports were reviewed, multiple MRI images reviewed on PACS.  _________________________________   HISTORICAL  CHIEF COMPLAINT:  Chief Complaint  Patient presents with  . Follow-up    RM 12, alone. Last seen 07/21/2020. She is walking a little better. Taking steps is still a challenge. Has been going to the gym the last 3-4 months. Does the stair climber which has helped some. Muscles are weak in R leg/unable to raise leg up.     HISTORY OF PRESENT ILLNESS:   Update 01/19/2021: She is a 50 year old woman who had transverse myelitis June 2020, with foci at T10 and C4-C5.  They occurred shortly after a COVID-19 infection.  We repeated MRI of the brain and spine 07/23/2019 and again in 08/22/2020.  Studies were reviewed.   No foci in brain.   No new spinal cord foci.    She has right > left mild leg weakness and numbness but feels her gait is doing a little better.      She walks without a cane. Stairs are difficult.  Legs give out some if she stands up a log time. .  She takes baclofen or flexeril if she has more spasms.   These were worse with a long car ride.    She no longer has dysesthesias.  She has mild visual issues helped by glasses which appear stable.   She feels bladder function is fine.      Transverse myelitis Hx.  In June 2020, she presented with lower back pain and right > left leg pain and dysesthesia.   She notes right foot pain radiating into the foot (sole > dorsum).   She has numbness in the region of pain.    She has less pain and no numbness in the left foot but there is a warm sensation at times.     Symptoms started about 5 weeks ago and came on within 1 day.   There was no preceding event such  as heavy lifting or excessive activity.   Pain was more severe in her back initially and was located in the lower lumbar region.    She went to the ED.   MRI showed a small disc herniation at L4-L5 and disc protrusion at L5-S1 but no definite nerve root compression.  MRI of the thoracic spine showed a focus to the right in the spinal cord at T10 concerning for demyelination.  Therefore she was admitted for further evaluation treatment.  The cervical spine MRI showed a small left hemicord focus adjacent to C4C5.  The brain did not show any abnormal foci.  None of the MRIs showed any enhancement.  I personally reviewed the MRIs of the spine and brain and concur with the official results.  MRI of the spine also showed some changes in the lungs concerning for COVID-19.  She tested positive for SARS-COV-2 in the ED.  Her husband also tested positive and he had mild respiratory symptoms but was never hospitalized.  Repeat MRI of the thoracic spine 07/23/2019 showed resolution of the cervical spine focus.  The MRI of the brain 07/23/2019 remained negative.  Repeat MRI of the brain and spine 08/22/2020 showed no new lesions.     REVIEW OF SYSTEMS: Constitutional: No fevers,  chills, sweats, or change in appetite Eyes: No visual changes, double vision, eye pain Ear, nose and throat: No hearing loss, ear pain, nasal congestion, sore throat Cardiovascular: No chest pain, palpitations Respiratory: No shortness of breath at rest or with exertion.   No wheezes GastrointestinaI: No nausea, vomiting, diarrhea, abdominal pain, fecal incontinence Genitourinary: No dysuria, urinary retention or frequency.  No nocturia. Musculoskeletal:Back pain.  Degenerative disc changes at L4-L5 and L5-S1.   Integumentary: No rash, pruritus, skin lesions Neurological: as above Psychiatric: No depression at this time.  No anxiety Endocrine: No palpitations, diaphoresis, change in appetite, change in weigh or increased  thirst Hematologic/Lymphatic: No anemia, purpura, petechiae. Allergic/Immunologic: No itchy/runny eyes, nasal congestion, recent allergic reactions, rashes  ALLERGIES: Allergies  Allergen Reactions  . Tylox [Oxycodone-Acetaminophen] Swelling    Caused face to swell.  Patient stated this happened a long time ago and since then has taken oxycodone with no problems    HOME MEDICATIONS:  Current Outpatient Medications:  .  benzonatate (TESSALON) 100 MG capsule, Take 1 capsule (100 mg total) by mouth every 8 (eight) hours., Disp: 21 capsule, Rfl: 0 .  fluticasone (FLONASE) 50 MCG/ACT nasal spray, Place 2 sprays into both nostrils daily., Disp: 16 mL, Rfl: 0 .  Insulin Glargine (BASAGLAR KWIKPEN) 100 UNIT/ML, Inject 10 Units into the skin daily. With pen needles. Or as directed, Disp: 15 mL, Rfl: 5 .  metFORMIN (GLUCOPHAGE) 500 MG tablet, TAKE 4 TABLETS BY MOUTH EVERY DAY Needs appointment for refills., Disp: 120 tablet, Rfl: 0 .  Olmesartan-amLODIPine-HCTZ 40-5-25 MG TABS, Take 1 tablet by mouth daily., Disp: 90 tablet, Rfl: 0 .  rosuvastatin (CRESTOR) 10 MG tablet, Take 1 tablet (10 mg total) by mouth daily., Disp: 90 tablet, Rfl: 1 .  Semaglutide,0.25 or 0.5MG /DOS, (OZEMPIC, 0.25 OR 0.5 MG/DOSE,) 2 MG/1.5ML SOPN, Inject 0.25 mg once a week x 4 weeks ,then increase to 0.5 mg per weekly, Disp: 1.5 mL, Rfl: 3 .  vitamin C (VITAMIN C) 500 MG tablet, Take 1 tablet (500 mg total) by mouth daily. Please take for 2 weeks, Disp: , Rfl:  .  zinc sulfate 220 (50 Zn) MG capsule, Take 1 capsule (220 mg total) by mouth daily. Please take for 2 weeks, Disp: , Rfl:  .  baclofen (LIORESAL) 10 MG tablet, Take 1 tablet (10 mg total) by mouth daily as needed for muscle spasms., Disp: 90 each, Rfl: 4 .  cyclobenzaprine (FLEXERIL) 5 MG tablet, Take one po qd prn, Disp: 90 tablet, Rfl: 4  PAST MEDICAL HISTORY: Past Medical History:  Diagnosis Date  . Diabetes mellitus without complication ([place])   . Family  history of breast cancer   . Family history of colon cancer   . Hypertension   . Hypoglycemia   . Radiation 10/21/2015-10/23/2015   Anterior neck area 12 gray in 3 fractions  . Teratoma     PAST SURGICAL HISTORY: Past Surgical History:  Procedure Laterality Date  . ABDOMINAL HERNIA REPAIR    . ABDOMINAL HYSTERECTOMY    . CESAREAN SECTION    . CRYOTHERAPY  2013   to neck  . DERMOID CYST  EXCISION    . ECTOPIC PREGNANCY SURGERY    . EXCISION MASS NECK Bilateral 10/20/2015   Procedure: EXCISION OF LARGE KELOIDS SEVERE RIGHT AND LEFT NECK WITH PLASTIC RECONSTRUCTION;  Surgeon: Cristine Polio, MD;  Location: [facility];  Service: Plastics;  Laterality: Bilateral;  . HAND SURGERY     amputation of right index  finger    FAMILY HISTORY: Family History  Problem Relation Age of Onset  . Hypertension Other   . Hypertension Mother   . Diabetes Mother   . Diabetes Father   . Thyroid disease Father   . Heart failure Father   . Breast cancer Maternal Aunt        dx 50?  Marland Kitchen Colon cancer Maternal Grandmother 22    SOCIAL HISTORY:  Social History   Socioeconomic History  . Marital status: Married    Spouse name: Not on file  . Number of children: 2  . Years of education: Not on file  . Highest education level: Not on file  Occupational History  . Occupation: Therapist, music  Tobacco Use  . Smoking status: Never Smoker  . Smokeless tobacco: Never Used  Vaping Use  . Vaping Use: Never used  Substance and Sexual Activity  . Alcohol use: Yes    Comment: socially  . Drug use: No  . Sexual activity: Yes    Birth control/protection: Surgical  Other Topics Concern  . Not on file  Social History Narrative  . Not on file   Social Determinants of Health   Financial Resource Strain: Not on file  Food Insecurity: Not on file  Transportation Needs: Not on file  Physical Activity: Not on file  Stress: Not on file  Social Connections: Not on file  Intimate Partner  Violence: Not on file     PHYSICAL EXAM PHYSICAL EXAM  Vitals:   01/19/21 1257  BP: 123/78  Pulse: 88  Weight: 249 lb 8 oz (113.2 kg)  Height: 5\' 5"  (1.651 m)    Body mass index is 41.52 kg/m.   General: The patient is well-developed and well-nourished and in no acute distress.  Head is [place]/AT   Neurologic Exam  Mental status: The patient is alert and oriented x 3 at the time of the examination. The patient has apparent normal recent and remote memory, with an apparently normal attention span and concentration ability.   Speech is normal.  Cranial nerves: Extraocular movements are full. Symmetric color vision.  Facial strength is normal.    Trapezius and sternocleidomastoid strength is normal.     No obvious hearing deficits are noted.  Motor:  Muscle bulk is normal.   Tone is increased in legs, right > left.. Strength is  5 / 5 in all 4 extremities.   Sensory: Intact sensation to touch, vibration in arms but reduced vibration and touch in right leg relative to the left  Coordination: Cerebellar testing reveals good finger-nose-finger and reduced right heel-to-shin bilaterally.  Gait and station: Station is normal. Gait is mildly wide.  Tandem gait is moderately wide.  Romberg is negative.   Reflexes: Deep tendon reflexes are symmetric and normal bilaterally.        DIAGNOSTIC DATA (LABS, IMAGING, TESTING) - I reviewed patient records, labs, notes, testing and imaging myself where available.  Lab Results  Component Value Date   WBC 7.0 06/17/2020   HGB 13.0 06/17/2020   HCT 39.2 06/17/2020   MCV 89.7 06/17/2020   PLT 266 06/17/2020      Component Value Date/Time   NA 140 06/17/2020 1134   K 4.0 06/17/2020 1134   CL 101 06/17/2020 1134   CO2 29 06/17/2020 1134   GLUCOSE 156 (H) 06/17/2020 1134   BUN 13 06/17/2020 1134   CREATININE 0.77 06/17/2020 1134   CALCIUM 10.1 06/17/2020 1134   PROT 7.4 06/17/2020 1134  ALBUMIN 4.4 07/06/2019 1423   AST 17  06/17/2020 1134   ALT 26 06/17/2020 1134   ALKPHOS 85 07/06/2019 1423   BILITOT 0.7 06/17/2020 1134   GFRNONAA 90 06/17/2020 1134   GFRAA 104 06/17/2020 1134   Lab Results  Component Value Date   CHOL 233 (H) 06/17/2020   HDL 45 (L) 06/17/2020   LDLCALC 157 (H) 06/17/2020   LDLDIRECT 96.0 07/06/2019   TRIG 179 (H) 06/17/2020   CHOLHDL 5.2 (H) 06/17/2020   Lab Results  Component Value Date   HGBA1C 8.0 (A) 11/17/2020    Lab Results  Component Value Date   TSH 1.73 06/17/2020       ASSESSMENT AND PLAN  1. Transverse myelitis (HCC)   2. Gait disturbance   3. Numbness   4. Right leg weakness      1.  She had transverse myelitis with 2 foci shortly after a COVID-19 infection.  Follow-up MRI x 2 showed no additional foci.   Thus, MS is unlikely.   Will not need further f/u MRI unless new symptoms 2.   Continue baclofen and cyclobenzapine as needed for spasms 3.   Return as needed if there are new or worsening neurologic symptoms.    She can ask her PCP to fill spasm med's as needed.   We can see back annually is she will need refills   Primo Innis A. Felecia Shelling, MD, [facility] 0/0/1749, 4:49 PM Certified in Neurology, Clinical Neurophysiology, Sleep Medicine and Neuroimaging  [facility] [address] [place], [place] [address] [phone]

## 2021-01-30 ENCOUNTER — Other Ambulatory Visit: Payer: Self-pay

## 2021-01-30 ENCOUNTER — Ambulatory Visit (AMBULATORY_SURGERY_CENTER): Payer: BC Managed Care – PPO | Admitting: *Deleted

## 2021-01-30 VITALS — Ht 73.0 in | Wt 245.0 lb

## 2021-01-30 DIAGNOSIS — Z1211 Encounter for screening for malignant neoplasm of colon: Secondary | ICD-10-CM

## 2021-01-30 MED ORDER — NA SULFATE-K SULFATE-MG SULF 17.5-3.13-1.6 GM/177ML PO SOLN: ORAL | 0 refills | Status: DC

## 2021-01-30 NOTE — Progress Notes (Signed)
Patient's pre-visit was done today over the phone with the patient due to COVID-19 pandemic. Name,DOB and address verified. Insurance verified. Patient denies any allergies to Eggs and Soy. Patient denies any problems with anesthesia/sedation. Patient denies taking diet pills or blood thinners. Packet of Prep instructions mailed to patient including a copy of a consent form-pt is aware. Patient understands to call us back with any questions or concerns. Patient is aware of our care-partner policy and UCJAR-01 safety protocol. EMMI education assigned to the patient for the procedure, sent to [place]. The patient is COVID-19 vaccinated, per patient.

## 2021-02-05 ENCOUNTER — Telehealth: Payer: Self-pay | Admitting: Internal Medicine

## 2021-02-05 NOTE — Telephone Encounter (Signed)
Left a message for the pt to return my call.  

## 2021-02-05 NOTE — Telephone Encounter (Signed)
Pt call and stated the pharmacy said that they can't get her bp pill because it is on back order nation wide and want a call back to tell her what to do.

## 2021-02-06 ENCOUNTER — Other Ambulatory Visit: Payer: Self-pay

## 2021-02-06 MED ORDER — OLMESARTAN-AMLODIPINE-HCTZ 40-5-25 MG PO TABS: 1.0000 | ORAL_TABLET | Freq: Every day | ORAL | 0 refills | Status: DC

## 2021-02-09 NOTE — Telephone Encounter (Signed)
Left a message for the pt to return my call.  

## 2021-02-11 ENCOUNTER — Encounter: Payer: Self-pay | Admitting: Gastroenterology

## 2021-02-11 NOTE — Telephone Encounter (Signed)
I spoke with the pt and she sated she picked up her BP medication on 06/23.

## 2021-02-13 ENCOUNTER — Ambulatory Visit (AMBULATORY_SURGERY_CENTER): Payer: BC Managed Care – PPO | Admitting: Gastroenterology

## 2021-02-13 ENCOUNTER — Other Ambulatory Visit: Payer: Self-pay

## 2021-02-13 ENCOUNTER — Encounter: Payer: Self-pay | Admitting: Gastroenterology

## 2021-02-13 VITALS — BP 112/65 | HR 60 | Temp 96.0°F | Resp 18 | Ht 73.0 in | Wt 245.0 lb

## 2021-02-13 DIAGNOSIS — K635 Polyp of colon: Secondary | ICD-10-CM | POA: Diagnosis not present

## 2021-02-13 DIAGNOSIS — D126 Benign neoplasm of colon, unspecified: Secondary | ICD-10-CM

## 2021-02-13 DIAGNOSIS — D123 Benign neoplasm of transverse colon: Secondary | ICD-10-CM

## 2021-02-13 DIAGNOSIS — Z1211 Encounter for screening for malignant neoplasm of colon: Secondary | ICD-10-CM | POA: Diagnosis present

## 2021-02-13 MED ORDER — SODIUM CHLORIDE 0.9 % IV SOLN: 500.0000 mL | Freq: Once | INTRAVENOUS | Status: DC

## 2021-02-13 NOTE — Patient Instructions (Signed)
Impression/Recommendations:  Polyp and hemorrhoid handouts given to patient.  Resume previous diet. Continue present medications. Await pathology results.  Repeat colonoscopy date to be determined after pathology results reviewed.  YOU HAD AN ENDOSCOPIC PROCEDURE TODAY AT [facility]:   Refer to the procedure report that was given to you for any specific questions about what was found during the examination.  If the procedure report does not answer your questions, please call your gastroenterologist to clarify.  If you requested that your care partner not be given the details of your procedure findings, then the procedure report has been included in a sealed envelope for you to review at your convenience later.  YOU SHOULD EXPECT: Some feelings of bloating in the abdomen. Passage of more gas than usual.  Walking can help get rid of the air that was put into your GI tract during the procedure and reduce the bloating. If you had a lower endoscopy (such as a colonoscopy or flexible sigmoidoscopy) you may notice spotting of blood in your stool or on the toilet paper. If you underwent a bowel prep for your procedure, you may not have a normal bowel movement for a few days.  Please Note:  You might notice some irritation and congestion in your nose or some drainage.  This is from the oxygen used during your procedure.  There is no need for concern and it should clear up in a day or so.  SYMPTOMS TO REPORT IMMEDIATELY:  Following lower endoscopy (colonoscopy or flexible sigmoidoscopy):  Excessive amounts of blood in the stool  Significant tenderness or worsening of abdominal pains  Swelling of the abdomen that is new, acute  Fever of 100F or higher  For urgent or emergent issues, a gastroenterologist can be reached at any hour by calling [phone]. Do not use MyChart messaging for urgent concerns.    DIET:  We do recommend a small meal at first, but then you may proceed to  your regular diet.  Drink plenty of fluids but you should avoid alcoholic beverages for 24 hours.  ACTIVITY:  You should plan to take it easy for the rest of today and you should NOT DRIVE or use heavy machinery until tomorrow (because of the sedation medicines used during the test).    FOLLOW UP: Our staff will call the number listed on your records 48-72 hours following your procedure to check on you and address any questions or concerns that you may have regarding the information given to you following your procedure. If we do not reach you, we will leave a message.  We will attempt to reach you two times.  During this call, we will ask if you have developed any symptoms of COVID 19. If you develop any symptoms (ie: fever, flu-like symptoms, shortness of breath, cough etc.) before then, please call [phone].  If you test positive for Covid 19 in the 2 weeks post procedure, please call and report this information to [place].    If any biopsies were taken you will be contacted by phone or by letter within the next 1-3 weeks.  Please call us at [phone] if you have not heard about the biopsies in 3 weeks.    SIGNATURES/CONFIDENTIALITY: You and/or your care partner have signed paperwork which will be entered into your electronic medical record.  These signatures attest to the fact that that the information above on your After Visit Summary has been reviewed and is understood.  Full responsibility of the  confidentiality of this discharge information lies with you and/or your care-partner.  

## 2021-02-13 NOTE — Progress Notes (Signed)
A and O x3. Report to RN. Tolerated MAC anesthesia well. 

## 2021-02-13 NOTE — Op Note (Signed)
[name] Procedure Date: 02/13/2021 9:28 AM MRN: [id number] Endoscopist: Thornton Park MD, MD Age: 50 Referring MD:  Date of Birth: February 04, 1971 Gender: Female Account #: [id number] Procedure:                Colonoscopy Indications:              Screening for colorectal malignant neoplasm, This                            is the patient's first colonoscopy                           No known family history of colon cancer or polyps Medicines:                Monitored Anesthesia Care Procedure:                Pre-Anesthesia Assessment:                           - Prior to the procedure, a History and Physical                            was performed, and patient medications and                            allergies were reviewed. The patient's tolerance of                            previous anesthesia was also reviewed. The risks                            and benefits of the procedure and the sedation                            options and risks were discussed with the patient.                            All questions were answered, and informed consent                            was obtained. Prior Anticoagulants: The patient has                            taken no previous anticoagulant or antiplatelet                            agents. ASA Grade Assessment: II - A patient with                            mild systemic disease. After reviewing the risks                            and benefits, the patient was deemed in  satisfactory condition to undergo the procedure.                           After obtaining informed consent, the colonoscope                            was passed under direct vision. Throughout the                            procedure, the patient's blood pressure, pulse, and                            oxygen saturations were monitored continuously. The                            Olympus CF-HQ190L  [id number]) Colonoscope was                            introduced through the anus and advanced to the 3                            cm into the ileum. A second forward view of the                            right colon was performed. The colonoscopy was                            performed without difficulty. The patient tolerated                            the procedure well. The quality of the bowel                            preparation was good. The terminal ileum, ileocecal                            valve, appendiceal orifice, and rectum were                            photographed. Scope In: 9:35:34 AM Scope Out: 9:52:28 AM Scope Withdrawal Time: 0 hours 14 minutes 5 seconds  Total Procedure Duration: 0 hours 16 minutes 54 seconds  Findings:                 The perianal and digital rectal examinations were                            normal.                           Non-bleeding internal hemorrhoids were found.                           A 2-3 mm polyp was found in the hepatic flexure.  The polyp was sessile. The polyp was removed with a                            cold snare. Resection and retrieval were complete.                            Estimated blood loss was minimal.                           The exam was otherwise without abnormality on                            direct and retroflexion views. Complications:            No immediate complications. Estimated blood loss:                            Minimal. Estimated Blood Loss:     Estimated blood loss was minimal. Impression:               - Non-bleeding internal hemorrhoids.                           - One 2-3 mm polyp at the hepatic flexure, removed                            with a cold snare. Resected and retrieved.                           - The examination was otherwise normal on direct                            and retroflexion views. Recommendation:           - Patient has a contact number  available for                            emergencies. The signs and symptoms of potential                            delayed complications were discussed with the                            patient. Return to normal activities tomorrow.                            Written discharge instructions were provided to the                            patient.                           - Resume previous diet.                           - Continue present medications.                           -  Await pathology results.                           - Repeat colonoscopy date to be determined after                            pending pathology results are reviewed for                            surveillance.                           - Emerging evidence supports eating a diet of                            fruits, vegetables, grains, calcium, and yogurt                            while reducing red meat and alcohol may reduce the                            risk of colon cancer.                           - Thank you for allowing me to be involved in your                            colon cancer prevention. Thornton Park MD, MD 02/13/2021 9:56:09 AM This report has been signed electronically.

## 2021-02-13 NOTE — Progress Notes (Signed)
Medical history reviewed with no changes noted since PV. VS assessed by C.W 

## 2021-02-13 NOTE — Progress Notes (Signed)
Called to room to assist during endoscopic procedure.  Patient ID and intended procedure confirmed with present staff. Received instructions for my participation in the procedure from the performing physician.  

## 2021-02-18 ENCOUNTER — Telehealth: Payer: Self-pay

## 2021-02-18 NOTE — Telephone Encounter (Signed)
Left message on follow up call. 

## 2021-02-18 NOTE — Telephone Encounter (Signed)
No answer on follow up call. 

## 2021-02-20 ENCOUNTER — Encounter: Payer: Self-pay | Admitting: Gastroenterology

## 2021-02-23 ENCOUNTER — Ambulatory Visit: Payer: BC Managed Care – PPO | Admitting: Internal Medicine

## 2021-03-09 ENCOUNTER — Ambulatory Visit: Payer: BC Managed Care – PPO | Admitting: Internal Medicine

## 2021-04-01 ENCOUNTER — Ambulatory Visit: Payer: BC Managed Care – PPO | Admitting: Internal Medicine

## 2021-04-01 ENCOUNTER — Other Ambulatory Visit: Payer: Self-pay | Admitting: Internal Medicine

## 2021-04-03 NOTE — Telephone Encounter (Signed)
Last 3 appts have been cancelled  Have her make another fu appt  and then refill  x 2

## 2021-04-03 NOTE — Telephone Encounter (Signed)
Follow up scheduled for 10/24

## 2021-06-07 NOTE — Progress Notes (Signed)
Chief Complaint  Patient presents with   Follow-up    HPI: [name] 50 y.o. come in for Chronic disease management   BP: up and down is taking combo pill olmesartan amlodipine HCTZ without problem although sometimes there is supply chain not available.  No untoward side effects that she reports DM metformin not consistent.  Taking may be 1 500 mg 3 x per week [name] does make her the bathroom immediately without diarrhea Was unable to tolerate the Ozempic because GI symptoms constipation.  Even at half dose.  Basiglar  8 unit per day doing well on this and fine taking this usually 170 range   non fasting.  Asks about fish oil not taking the Crestor. Recently has been having some episodes of what she calls shortness of breath dyspnea on exertion that leaves her a bit scared.  Not that frequent but does happen recently was in the store and had to stop and sit down and relax.  Uncertain if her heart rate is racing but she does feel it.  Does not associate it with with dehydration or medication.  It does not seem to happen when she is working out she noted it more in the summer   10 minutes   sits and rest and then get s better .  After calming.  No cp nausea or vomiting Keloids are progressing at times discouraging for her but she is doing okay. Transverse myelitis is stable still gets some numbness in her feet. ROS: See pertinent positives and negatives per HPI.  Past Medical History:  Diagnosis Date   Diabetes mellitus without complication (HCC)    Family history of breast cancer    Family history of colon cancer    Hypertension    Hypoglycemia    Radiation 10/21/2015-10/23/2015   Anterior neck area 12 gray in 3 fractions   Sleep apnea    Teratoma    Transverse myelitis ([place])     Family History  Problem Relation Age of Onset   Hypertension Mother    Diabetes Mother    Diabetes Father    Thyroid disease Father    Heart failure Father    Breast cancer Maternal Aunt         dx 31?   Colon cancer Maternal Grandmother 72   Hypertension Other    Colon polyps Neg Hx    Esophageal cancer Neg Hx    Rectal cancer Neg Hx    Stomach cancer Neg Hx     Social History   Socioeconomic History   Marital status: Married    Spouse name: Not on file   Number of children: 2   Years of education: Not on file   Highest education level: Not on file  Occupational History   Occupation: Therapist, music  Tobacco Use   Smoking status: Some Days    Types: Cigars   Smokeless tobacco: Never  Vaping Use   Vaping Use: Never used  Substance and Sexual Activity   Alcohol use: Yes    Alcohol/week: 1.0 standard drink    Types: 1 Glasses of wine per week    Comment: socially   Drug use: No   Sexual activity: Yes    Birth control/protection: Surgical  Other Topics Concern   Not on file  Social History Narrative   Not on file   Social Determinants of Health   Financial Resource Strain: Not on file  Food Insecurity: Not on file  Transportation Needs: Not on  file  Physical Activity: Not on file  Stress: Not on file  Social Connections: Not on file    Outpatient Medications Prior to Visit  Medication Sig Dispense Refill   baclofen (LIORESAL) 10 MG tablet Take 1 tablet (10 mg total) by mouth daily as needed for muscle spasms. 90 each 4   cyclobenzaprine (FLEXERIL) 5 MG tablet Take one po qd prn 90 tablet 4   fluticasone (FLONASE) 50 MCG/ACT nasal spray Place 2 sprays into both nostrils daily. 16 mL 0   Insulin Glargine (BASAGLAR KWIKPEN) 100 UNIT/ML INJECT 0.4 ML UNDER THE SKIN DAILY. WITH PEN NEEDLES. INCREASE 2 UNITS AS DIRECTED 15 mL 2   metFORMIN (GLUCOPHAGE) 500 MG tablet TAKE 4 TABLETS BY MOUTH EVERY DAY Needs appointment for refills. 120 tablet 0   vitamin C (VITAMIN C) 500 MG tablet Take 1 tablet (500 mg total) by mouth daily. Please take for 2 weeks     zinc sulfate 220 (50 Zn) MG capsule Take 1 capsule (220 mg total) by mouth daily. Please take for 2 weeks      Olmesartan-amLODIPine-HCTZ 40-5-25 MG TABS Take 1 tablet by mouth daily. 90 tablet 0   rosuvastatin (CRESTOR) 10 MG tablet Take 1 tablet (10 mg total) by mouth daily. 90 tablet 1   Semaglutide,0.25 or 0.5MG /DOS, (OZEMPIC, 0.25 OR 0.5 MG/DOSE,) 2 MG/1.5ML SOPN Inject 0.25 mg once a week x 4 weeks ,then increase to 0.5 mg per weekly 1.5 mL 3   benzonatate (TESSALON) 100 MG capsule Take 1 capsule (100 mg total) by mouth every 8 (eight) hours. (Patient not taking: No sig reported) 21 capsule 0   No facility-administered medications prior to visit.     EXAM:  BP 116/70 (BP Location: Left Arm, Patient Position: Sitting, Cuff Size: Large)   Pulse 95   Temp 98.5 F (36.9 C) (Oral)   Ht 6\' 1"  (1.854 m)   Wt 250 lb 12.8 oz (113.8 kg)   SpO2 96%   BMI 33.09 kg/m   Body mass index is 33.09 kg/m.  GENERAL: vitals reviewed and listed above, alert, oriented, appears well hydrated and in no acute distress HEENT: atraumatic, conjunctiva  clear, no obvious abnormalities on inspection of external nose and ears OP : Masked NECK: no obvious masses on inspection palpation  LUNGS: clear to auscultation bilaterally, no wheezes, rales or rhonchi, good air movement CV: HRRR, no clubbing cyanosis slight peripheral edema nl cap refill  Skin keloids abdomen neck MS: moves all extremities without noticeable focal  abnormality PSYCH: pleasant and cooperative, no obvious depression or anxiety Lab Results  Component Value Date   WBC 7.0 06/17/2020   HGB 13.0 06/17/2020   HCT 39.2 06/17/2020   PLT 266 06/17/2020   GLUCOSE 156 (H) 06/17/2020   CHOL 233 (H) 06/17/2020   TRIG 179 (H) 06/17/2020   HDL 45 (L) 06/17/2020   LDLDIRECT 96.0 07/06/2019   LDLCALC 157 (H) 06/17/2020   ALT 26 06/17/2020   AST 17 06/17/2020   NA 140 06/17/2020   K 4.0 06/17/2020   CL 101 06/17/2020   CREATININE 0.77 06/17/2020   BUN 13 06/17/2020   CO2 29 06/17/2020   TSH 1.73 06/17/2020   HGBA1C 8.0 (A) 11/17/2020    MICROALBUR 1.2 06/17/2020   BP Readings from Last 3 Encounters:  06/08/21 116/70  02/13/21 112/65  01/19/21 123/78    ASSESSMENT AND PLAN:  Discussed the following assessment and plan:  Dyspnea,  spells - consider r/o cardiac rhytym causes  cards consult. - Plan: Basic metabolic panel, CBC with Differential/Platelet, Hemoglobin A1c, Hepatic function panel, Lipid panel, TSH, T4, free, Microalbumin / creatinine urine ratio, Ambulatory referral to Cardiology, Microalbumin / creatinine urine ratio, T4, free, TSH, Lipid panel, Hepatic function panel, Hemoglobin A1c, CBC with Differential/Platelet, Basic metabolic panel  Medication management - Plan: Basic metabolic panel, CBC with Differential/Platelet, Hemoglobin A1c, Hepatic function panel, Lipid panel, TSH, T4, free, Microalbumin / creatinine urine ratio, Microalbumin / creatinine urine ratio, T4, free, TSH, Lipid panel, Hepatic function panel, Hemoglobin A1c, CBC with Differential/Platelet, Basic metabolic panel  Essential hypertension - Plan: Basic metabolic panel, CBC with Differential/Platelet, Hemoglobin A1c, Hepatic function panel, Lipid panel, TSH, T4, free, Microalbumin / creatinine urine ratio, Ambulatory referral to Cardiology, Microalbumin / creatinine urine ratio, T4, free, TSH, Lipid panel, Hepatic function panel, Hemoglobin A1c, CBC with Differential/Platelet, Basic metabolic panel  Type 2 diabetes mellitus with hyperglycemia, with long-term current use of insulin (HCC) - Plan: Basic metabolic panel, CBC with Differential/Platelet, Hemoglobin A1c, Hepatic function panel, Lipid panel, TSH, T4, free, Microalbumin / creatinine urine ratio, Ambulatory referral to Cardiology, Microalbumin / creatinine urine ratio, T4, free, TSH, Lipid panel, Hepatic function panel, Hemoglobin A1c, CBC with Differential/Platelet, Basic metabolic panel  Transverse myelitis (HCC) - Plan: Basic metabolic panel, CBC with Differential/Platelet, Hemoglobin A1c,  Hepatic function panel, Lipid panel, TSH, T4, free, Microalbumin / creatinine urine ratio, Microalbumin / creatinine urine ratio, T4, free, TSH, Lipid panel, Hepatic function panel, Hemoglobin A1c, CBC with Differential/Platelet, Basic metabolic panel Due for labs  Pressure seems to be better on current regimen she does want to limit number of medicines if available would continue. Blood sugar control check today she is taking 8 units of Basaglar and the metformin 500 mg about 3 days a week. Was unable to tolerate Ozempic. Instead of fish oil advise she take the rosuvastatin she is willing has not started it yet. In regard to her episodes of shortness of breath or dyspnea on exertion with decreased exercise tolerance uncertain cause seems to come and go concerned about arrhythmia rule out cardiovascular cause.  Monitor heart rate and hydration status. For cardiology for assessment. Can let us know if her insurance will pay for continuous glucose monitoring such as freestyle libre and we can order  -Patient advised to return or notify health care team  if  new concerns arise.  Patient Instructions  Good to see  you today  Lab monitoring  will refill rosuvastatin and  bp meds. As discussed .  For now stay on insuline and metformin .  Will  be contacted  about cardiology referral for the  spells of  SOB.   Check your pulse at these times.   Plan FU depending    3 mos    or as indicated .   Standley Brooking. Dorn Hartshorne M.D.

## 2021-06-08 ENCOUNTER — Other Ambulatory Visit: Payer: Self-pay

## 2021-06-08 ENCOUNTER — Ambulatory Visit (INDEPENDENT_AMBULATORY_CARE_PROVIDER_SITE_OTHER): Payer: BC Managed Care – PPO | Admitting: Internal Medicine

## 2021-06-08 ENCOUNTER — Encounter: Payer: Self-pay | Admitting: Internal Medicine

## 2021-06-08 VITALS — BP 116/70 | HR 95 | Temp 98.5°F | Ht 73.0 in | Wt 250.8 lb

## 2021-06-08 DIAGNOSIS — G373 Acute transverse myelitis in demyelinating disease of central nervous system: Secondary | ICD-10-CM

## 2021-06-08 DIAGNOSIS — Z79899 Other long term (current) drug therapy: Secondary | ICD-10-CM

## 2021-06-08 DIAGNOSIS — E1165 Type 2 diabetes mellitus with hyperglycemia: Secondary | ICD-10-CM | POA: Diagnosis not present

## 2021-06-08 DIAGNOSIS — R0600 Dyspnea, unspecified: Secondary | ICD-10-CM | POA: Diagnosis not present

## 2021-06-08 DIAGNOSIS — I10 Essential (primary) hypertension: Secondary | ICD-10-CM | POA: Diagnosis not present

## 2021-06-08 DIAGNOSIS — Z794 Long term (current) use of insulin: Secondary | ICD-10-CM

## 2021-06-08 LAB — CBC WITH DIFFERENTIAL/PLATELET
Basophils Absolute: 0 10*3/uL (ref 0.0–0.1)
Basophils Relative: 0.5 % (ref 0.0–3.0)
Eosinophils Absolute: 0.1 10*3/uL (ref 0.0–0.7)
Eosinophils Relative: 1.8 % (ref 0.0–5.0)
HCT: 39.1 % (ref 36.0–46.0)
Hemoglobin: 13.3 g/dL (ref 12.0–15.0)
Lymphocytes Relative: 41 % (ref 12.0–46.0)
Lymphs Abs: 2.8 10*3/uL (ref 0.7–4.0)
MCHC: 33.9 g/dL (ref 30.0–36.0)
MCV: 89.2 fl (ref 78.0–100.0)
Monocytes Absolute: 0.4 10*3/uL (ref 0.1–1.0)
Monocytes Relative: 5.8 % (ref 3.0–12.0)
Neutro Abs: 3.5 10*3/uL (ref 1.4–7.7)
Neutrophils Relative %: 50.9 % (ref 43.0–77.0)
Platelets: 225 10*3/uL (ref 150.0–400.0)
RBC: 4.38 Mil/uL (ref 3.87–5.11)
RDW: 12.8 % (ref 11.5–15.5)
WBC: 6.9 10*3/uL (ref 4.0–10.5)

## 2021-06-08 LAB — HEPATIC FUNCTION PANEL
ALT: 25 U/L (ref 0–35)
AST: 27 U/L (ref 0–37)
Albumin: 4.5 g/dL (ref 3.5–5.2)
Alkaline Phosphatase: 97 U/L (ref 39–117)
Bilirubin, Direct: 0.1 mg/dL (ref 0.0–0.3)
Total Bilirubin: 0.8 mg/dL (ref 0.2–1.2)
Total Protein: 7.7 g/dL (ref 6.0–8.3)

## 2021-06-08 LAB — BASIC METABOLIC PANEL
BUN: 17 mg/dL (ref 6–23)
CO2: 28 mEq/L (ref 19–32)
Calcium: 9.9 mg/dL (ref 8.4–10.5)
Chloride: 102 mEq/L (ref 96–112)
Creatinine, Ser: 0.9 mg/dL (ref 0.40–1.20)
GFR: 74.29 mL/min (ref >60.00)
Glucose, Bld: 153 mg/dL — ABNORMAL HIGH (ref 70–99)
Potassium: 3.5 mEq/L (ref 3.5–5.1)
Sodium: 139 mEq/L (ref 135–145)

## 2021-06-08 LAB — T4, FREE: Free T4: 0.92 ng/dL (ref 0.60–1.60)

## 2021-06-08 LAB — MICROALBUMIN / CREATININE URINE RATIO
Creatinine,U: 224.9 mg/dL
Microalb Creat Ratio: 0.4 mg/g (ref 0.0–30.0)
Microalb, Ur: 1 mg/dL (ref 0.0–1.9)

## 2021-06-08 LAB — LIPID PANEL
Cholesterol: 209 mg/dL — ABNORMAL HIGH (ref 0–200)
HDL: 39.5 mg/dL (ref >39.00)
NonHDL: 169.73
Total CHOL/HDL Ratio: 5
Triglycerides: 213 mg/dL — ABNORMAL HIGH (ref 0.0–149.0)
VLDL: 42.6 mg/dL — ABNORMAL HIGH (ref 0.0–40.0)

## 2021-06-08 LAB — LDL CHOLESTEROL, DIRECT: Direct LDL: 142 mg/dL

## 2021-06-08 LAB — TSH: TSH: 1.04 u[IU]/mL (ref 0.35–5.50)

## 2021-06-08 LAB — HEMOGLOBIN A1C: Hgb A1c MFr Bld: 8 % — ABNORMAL HIGH (ref 4.6–6.5)

## 2021-06-08 MED ORDER — ROSUVASTATIN CALCIUM 10 MG PO TABS: 10.0000 mg | ORAL_TABLET | Freq: Every day | ORAL | 3 refills | Status: DC

## 2021-06-08 MED ORDER — OLMESARTAN-AMLODIPINE-HCTZ 40-5-25 MG PO TABS: 1.0000 | ORAL_TABLET | Freq: Every day | ORAL | 3 refills | Status: DC

## 2021-06-08 NOTE — Patient Instructions (Signed)
Good to see  you today  Lab monitoring  will refill rosuvastatin and  bp meds. As discussed .  For now stay on insuline and metformin .  Will  be contacted  about cardiology referral for the  spells of  SOB.   Check your pulse at these times.   Plan FU depending    3 mos    or as indicated .

## 2021-06-08 NOTE — Progress Notes (Signed)
A1c is still 8   increase Basiglar  to  12 units per day   and try to take metformin more  regularly.    Take the crestor as we discussed   Plan hg a1c and lipid panel before next visit  in 3-4 mos

## 2021-08-31 ENCOUNTER — Encounter (HOSPITAL_COMMUNITY): Payer: Self-pay

## 2021-08-31 ENCOUNTER — Other Ambulatory Visit: Payer: Self-pay

## 2021-08-31 ENCOUNTER — Ambulatory Visit (HOSPITAL_COMMUNITY)
Admission: EM | Admit: 2021-08-31 | Discharge: 2021-08-31 | Disposition: A | Discharge status: Home / Self Care | Payer: BC Managed Care – PPO | Attending: Internal Medicine | Admitting: Internal Medicine

## 2021-08-31 DIAGNOSIS — N10 Acute tubulo-interstitial nephritis: Secondary | ICD-10-CM | POA: Insufficient documentation

## 2021-08-31 LAB — POCT URINALYSIS DIPSTICK, ED / UC
Bilirubin Urine: NEGATIVE
Glucose, UA: >=1000 mg/dL — AB
Ketones, ur: NEGATIVE mg/dL
Leukocytes,Ua: NEGATIVE
Nitrite: POSITIVE — AB
Protein, ur: NEGATIVE mg/dL
Specific Gravity, Urine: <=1.005 (ref 1.005–1.030)
Urobilinogen, UA: 0.2 mg/dL (ref 0.0–1.0)
pH: 5 (ref 5.0–8.0)

## 2021-08-31 MED ORDER — CIPROFLOXACIN HCL 500 MG PO TABS: 500.0000 mg | ORAL_TABLET | Freq: Two times a day (BID) | ORAL | 0 refills | Status: AC

## 2021-08-31 NOTE — Discharge Instructions (Addendum)
Increase oral fluid intake We will call you with recommendations if labs are abnormal If you have worsening symptoms please return to urgent care to be reevaluated.

## 2021-08-31 NOTE — ED Triage Notes (Signed)
Pt presents with urinary frequency, burning during urination, and flank pain X 3 days.

## 2021-09-01 LAB — URINE CULTURE

## 2021-09-02 NOTE — ED Provider Notes (Signed)
[place]    CSN: [id number] Arrival date & time: 08/31/21  1328      History   Chief Complaint Chief Complaint  Patient presents with   Urinary Frequency   Flank Pain    HPI [name] is a 51 y.o. female comes to urgent care with a 3-day history of dysuria, urgency, frequency and right flank pain.  Patient denies any fever or chills.  No nausea, vomiting or diarrhea.  No abdominal pain.   HPI  Past Medical History:  Diagnosis Date   Diabetes mellitus without complication ([place])    Family history of breast cancer    Family history of colon cancer    Hypertension    Hypoglycemia    Radiation 10/21/2015-10/23/2015   Anterior neck area 12 gray in 3 fractions   Sleep apnea    Teratoma    Transverse myelitis [facility])     Patient Active Problem List   Diagnosis Date Noted   Genetic testing 12/17/2020   Family history of breast cancer    Family history of colon cancer    Cervical spinal stenosis 11/19/2019   Gait disturbance 05/21/2019   Transverse myelitis ([place]) 03/13/2019   Numbness 02/13/2019   Right leg weakness 02/13/2019   Lower extremity weakness 01/16/2019   Other incomplete lesion at T10 level of thoracic spinal cord, initial encounter ([place]) 01/16/2019   Pneumonia due to COVID-19 virus 01/16/2019   Notalgia 08/10/2017   Morbid obesity ([place]) 02/18/2017   OSA (obstructive sleep apnea) 01/24/2017   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   Snoring 10/22/2016   Nocturnal dyspnea/choking   10/22/2016   Keloid of skin 09/25/2015   Essential hypertension, benign 09/27/2014   Teratoma 08/07/2014   Severe hypertension 06/08/2013   HYPERLIPIDEMIA, MILD, WITH LOW HDL 07/23/2009   SHORTNESS OF BREATH 02/11/2009   HYPERTENSION 05/07/2008   KELOID SCAR 05/07/2008   HEADACHE 05/22/2007    Past Surgical History:  Procedure Laterality Date   ABDOMINAL HERNIA REPAIR     ABDOMINAL HYSTERECTOMY     CESAREAN SECTION     CRYOTHERAPY  2013   to  neck   DERMOID CYST  EXCISION     ECTOPIC PREGNANCY SURGERY     EXCISION MASS NECK Bilateral 10/20/2015   Procedure: EXCISION OF LARGE KELOIDS SEVERE RIGHT AND LEFT NECK WITH PLASTIC RECONSTRUCTION;  Surgeon: Cristine Polio, MD;  Location: [facility];  Service: Plastics;  Laterality: Bilateral;   HAND SURGERY     amputation of right index finger    OB History   No obstetric history on file.      Home Medications    Prior to Admission medications   Medication Sig Start Date End Date Taking? Authorizing Provider  ciprofloxacin (CIPRO) 500 MG tablet Take 1 tablet (500 mg total) by mouth every 12 (twelve) hours for 5 days. 08/31/21 09/05/21 Yes Matthan Sledge, Myrene Galas, MD  baclofen (LIORESAL) 10 MG tablet Take 1 tablet (10 mg total) by mouth daily as needed for muscle spasms. 01/19/21   Sater, Nanine Means, MD  cyclobenzaprine (FLEXERIL) 5 MG tablet Take one po qd prn 01/19/21   Sater, Nanine Means, MD  fluticasone (FLONASE) 50 MCG/ACT nasal spray Place 2 sprays into both nostrils daily. 11/02/20   Hughie Closs, PA-C  Insulin Glargine (BASAGLAR KWIKPEN) 100 UNIT/ML INJECT 0.4 ML UNDER THE SKIN DAILY. WITH PEN NEEDLES. INCREASE 2 UNITS AS DIRECTED 04/03/21   Panosh, Standley Brooking, MD  metFORMIN (GLUCOPHAGE) 500 MG  tablet TAKE 4 TABLETS BY MOUTH EVERY DAY Needs appointment for refills. 06/17/20   Panosh, Standley Brooking, MD  Olmesartan-amLODIPine-HCTZ 40-5-25 MG TABS Take 1 tablet by mouth daily. 06/08/21   Panosh, Standley Brooking, MD  rosuvastatin (CRESTOR) 10 MG tablet Take 1 tablet (10 mg total) by mouth daily. 06/08/21   Panosh, Standley Brooking, MD  vitamin C (VITAMIN C) 500 MG tablet Take 1 tablet (500 mg total) by mouth daily. Please take for 2 weeks 01/21/19   Elgergawy, Silver Huguenin, MD  zinc sulfate 220 (50 Zn) MG capsule Take 1 capsule (220 mg total) by mouth daily. Please take for 2 weeks 01/21/19   Elgergawy, Silver Huguenin, MD    Family History Family History  Problem Relation Age of Onset   Hypertension Mother     Diabetes Mother    Diabetes Father    Thyroid disease Father    Heart failure Father    Breast cancer Maternal Aunt        dx 8?   Colon cancer Maternal Grandmother 72   Hypertension Other    Colon polyps Neg Hx    Esophageal cancer Neg Hx    Rectal cancer Neg Hx    Stomach cancer Neg Hx     Social History Social History   Tobacco Use   Smoking status: Some Days    Types: Cigars   Smokeless tobacco: Never  Vaping Use   Vaping Use: Never used  Substance Use Topics   Alcohol use: Yes    Alcohol/week: 1.0 standard drink    Types: 1 Glasses of wine per week    Comment: socially   Drug use: No     Allergies   Tylox [oxycodone-acetaminophen]   Review of Systems Review of Systems  HENT: Negative.    Respiratory: Negative.    Gastrointestinal: Negative.   Genitourinary:  Positive for dysuria, flank pain and urgency. Negative for difficulty urinating, hematuria and vaginal discharge.  Musculoskeletal:  Positive for back pain.    Physical Exam Triage Vital Signs ED Triage Vitals  Enc Vitals Group     BP 08/31/21 1434 123/85     Pulse Rate 08/31/21 1434 89     Resp 08/31/21 1434 18     Temp 08/31/21 1434 97.8 F (36.6 C)     Temp Source 08/31/21 1434 Oral     SpO2 08/31/21 1434 97 %     Weight --      Height --      Head Circumference --      Peak Flow --      Pain Score 08/31/21 1432 6     Pain Loc --      Pain Edu? --      Excl. in [place]? --    No data found.  Updated Vital Signs BP 123/85 (BP Location: Left Arm)    Pulse 89    Temp 97.8 F (36.6 C) (Oral)    Resp 18    SpO2 97%   Visual Acuity Right Eye Distance:   Left Eye Distance:   Bilateral Distance:    Right Eye Near:   Left Eye Near:    Bilateral Near:     Physical Exam Vitals and nursing note reviewed.  Constitutional:      General: She is not in acute distress.    Appearance: Normal appearance. She is not ill-appearing.  Cardiovascular:     Rate and Rhythm: Normal rate and regular  rhythm.     Pulses:  Normal pulses.     Heart sounds: Normal heart sounds.  Pulmonary:     Effort: Pulmonary effort is normal.     Breath sounds: Normal breath sounds.  Abdominal:     General: Bowel sounds are normal.     Palpations: Abdomen is soft.  Neurological:     Mental Status: She is alert.     UC Treatments / Results  Labs (all labs ordered are listed, but only abnormal results are displayed)   EKG   Radiology No results found.  Procedures Procedures (including critical care time)  Medications Ordered in UC Medications - No data to display  Initial Impression / Assessment and Plan / UC Course  I have reviewed the triage vital signs and the nursing notes.  Pertinent labs & imaging results that were available during my care of the patient were reviewed by me and considered in my medical decision making (see chart for details).     1.  Acute pyelonephritis: Point-of-care urinalysis is positive for nitrites Urine cultures have been sent Ciprofloxacin 500 mg twice daily for 5 days Increase oral fluid intake We will call you with recommendations if labs are abnormal Return to urgent care if symptoms worsen. Final Clinical Impressions(s) / UC Diagnoses   Final diagnoses:  Acute pyelonephritis     Discharge Instructions      Increase oral fluid intake We will call you with recommendations if labs are abnormal If you have worsening symptoms please return to urgent care to be reevaluated.   ED Prescriptions     Medication Sig Dispense Auth. Provider   ciprofloxacin (CIPRO) 500 MG tablet Take 1 tablet (500 mg total) by mouth every 12 (twelve) hours for 5 days. 10 tablet Mahsa Hanser, Myrene Galas, MD      PDMP not reviewed this encounter.   Chase Picket, MD 09/02/21 1426

## 2021-09-06 NOTE — Progress Notes (Deleted)
No chief complaint on file.   HPI: [name] 51 y.o. come in for Chronic disease management   Had pyuelo rx in ed  Jan 16  pos nitrities no fever  but cx was mlultple specises  given cipro 500 bid for 5 days  also glucosuria  not addressed  Last appt oct for sypnea spells  HLD BG was unable to tp;erate ozempic  ROS: See pertinent positives and negatives per HPI.  Past Medical History:  Diagnosis Date   Diabetes mellitus without complication (HCC)    Family history of breast cancer    Family history of colon cancer    Hypertension    Hypoglycemia    Radiation 10/21/2015-10/23/2015   Anterior neck area 12 gray in 3 fractions   Sleep apnea    Teratoma    Transverse myelitis ([place])     Family History  Problem Relation Age of Onset   Hypertension Mother    Diabetes Mother    Diabetes Father    Thyroid disease Father    Heart failure Father    Breast cancer Maternal Aunt        dx 52?   Colon cancer Maternal Grandmother 72   Hypertension Other    Colon polyps Neg Hx    Esophageal cancer Neg Hx    Rectal cancer Neg Hx    Stomach cancer Neg Hx     Social History   Socioeconomic History   Marital status: Married    Spouse name: Not on file   Number of children: 2   Years of education: Not on file   Highest education level: Not on file  Occupational History   Occupation: Therapist, music  Tobacco Use   Smoking status: Some Days    Types: Cigars   Smokeless tobacco: Never  Vaping Use   Vaping Use: Never used  Substance and Sexual Activity   Alcohol use: Yes    Alcohol/week: 1.0 standard drink    Types: 1 Glasses of wine per week    Comment: socially   Drug use: No   Sexual activity: Yes    Birth control/protection: Surgical  Other Topics Concern   Not on file  Social History Narrative   Not on file   Social Determinants of Health   Financial Resource Strain: Not on file  Food Insecurity: Not on file  Transportation Needs: Not on file  Physical  Activity: Not on file  Stress: Not on file  Social Connections: Not on file    Outpatient Medications Prior to Visit  Medication Sig Dispense Refill   baclofen (LIORESAL) 10 MG tablet Take 1 tablet (10 mg total) by mouth daily as needed for muscle spasms. 90 each 4   cyclobenzaprine (FLEXERIL) 5 MG tablet Take one po qd prn 90 tablet 4   fluticasone (FLONASE) 50 MCG/ACT nasal spray Place 2 sprays into both nostrils daily. 16 mL 0   Insulin Glargine (BASAGLAR KWIKPEN) 100 UNIT/ML INJECT 0.4 ML UNDER THE SKIN DAILY. WITH PEN NEEDLES. INCREASE 2 UNITS AS DIRECTED 15 mL 2   metFORMIN (GLUCOPHAGE) 500 MG tablet TAKE 4 TABLETS BY MOUTH EVERY DAY Needs appointment for refills. 120 tablet 0   Olmesartan-amLODIPine-HCTZ 40-5-25 MG TABS Take 1 tablet by mouth daily. 90 tablet 3   rosuvastatin (CRESTOR) 10 MG tablet Take 1 tablet (10 mg total) by mouth daily. 90 tablet 3   vitamin C (VITAMIN C) 500 MG tablet Take 1 tablet (500 mg total) by mouth daily. Please  take for 2 weeks     zinc sulfate 220 (50 Zn) MG capsule Take 1 capsule (220 mg total) by mouth daily. Please take for 2 weeks     No facility-administered medications prior to visit.     EXAM:  There were no vitals taken for this visit.  There is no height or weight on file to calculate BMI.  GENERAL: vitals reviewed and listed above, alert, oriented, appears well hydrated and in no acute distress HEENT: atraumatic, conjunctiva  clear, no obvious abnormalities on inspection of external nose and ears OP :masked  NECK: no obvious masses on inspection palpation  LUNGS: clear to auscultation bilaterally, no wheezes, rales or rhonchi, good air movement CV: HRRR, no clubbing cyanosis or  peripheral edema nl cap refill  MS: moves all extremities without noticeable focal  abnormality PSYCH: pleasant and cooperative, no obvious depression or anxiety Lab Results  Component Value Date   WBC 6.9 06/08/2021   HGB 13.3 06/08/2021   HCT 39.1  06/08/2021   PLT 225.0 06/08/2021   GLUCOSE 153 (H) 06/08/2021   CHOL 209 (H) 06/08/2021   TRIG 213.0 (H) 06/08/2021   HDL 39.50 06/08/2021   LDLDIRECT 142.0 06/08/2021   LDLCALC 157 (H) 06/17/2020   ALT 25 06/08/2021   AST 27 06/08/2021   NA 139 06/08/2021   K 3.5 06/08/2021   CL 102 06/08/2021   CREATININE 0.90 06/08/2021   BUN 17 06/08/2021   CO2 28 06/08/2021   TSH 1.04 06/08/2021   HGBA1C 8.0 (H) 06/08/2021   MICROALBUR 1.0 06/08/2021   BP Readings from Last 3 Encounters:  08/31/21 123/85  06/08/21 116/70  02/13/21 112/65    ASSESSMENT AND PLAN:  Discussed the following assessment and plan:  No diagnosis found.  -Patient advised to return or notify health care team  if  new concerns arise.  There are no Patient Instructions on file for this visit.   Standley Brooking. Gamaliel Charney M.D.

## 2021-09-07 ENCOUNTER — Ambulatory Visit: Payer: BC Managed Care – PPO | Admitting: Internal Medicine

## 2022-01-10 ENCOUNTER — Other Ambulatory Visit: Payer: Self-pay | Admitting: Internal Medicine

## 2022-01-12 ENCOUNTER — Emergency Department (HOSPITAL_BASED_OUTPATIENT_CLINIC_OR_DEPARTMENT_OTHER): Payer: BC Managed Care – PPO | Admitting: Radiology

## 2022-01-12 ENCOUNTER — Other Ambulatory Visit: Payer: Self-pay

## 2022-01-12 ENCOUNTER — Emergency Department (HOSPITAL_BASED_OUTPATIENT_CLINIC_OR_DEPARTMENT_OTHER)
Admission: EM | Admit: 2022-01-12 | Discharge: 2022-01-12 | Disposition: A | Discharge status: Home / Self Care | Payer: BC Managed Care – PPO | Attending: Emergency Medicine | Admitting: Emergency Medicine

## 2022-01-12 ENCOUNTER — Encounter (HOSPITAL_BASED_OUTPATIENT_CLINIC_OR_DEPARTMENT_OTHER): Payer: Self-pay

## 2022-01-12 DIAGNOSIS — R079 Chest pain, unspecified: Secondary | ICD-10-CM | POA: Diagnosis not present

## 2022-01-12 DIAGNOSIS — R42 Dizziness and giddiness: Secondary | ICD-10-CM | POA: Diagnosis not present

## 2022-01-12 DIAGNOSIS — Z7984 Long term (current) use of oral hypoglycemic drugs: Secondary | ICD-10-CM | POA: Diagnosis not present

## 2022-01-12 DIAGNOSIS — R0789 Other chest pain: Secondary | ICD-10-CM | POA: Insufficient documentation

## 2022-01-12 DIAGNOSIS — R0602 Shortness of breath: Secondary | ICD-10-CM | POA: Insufficient documentation

## 2022-01-12 DIAGNOSIS — M545 Low back pain, unspecified: Secondary | ICD-10-CM | POA: Diagnosis not present

## 2022-01-12 DIAGNOSIS — Z794 Long term (current) use of insulin: Secondary | ICD-10-CM | POA: Insufficient documentation

## 2022-01-12 DIAGNOSIS — R739 Hyperglycemia, unspecified: Secondary | ICD-10-CM | POA: Insufficient documentation

## 2022-01-12 DIAGNOSIS — E1165 Type 2 diabetes mellitus with hyperglycemia: Secondary | ICD-10-CM | POA: Diagnosis not present

## 2022-01-12 LAB — TROPONIN I (HIGH SENSITIVITY)
Troponin I (High Sensitivity): 5 ng/L (ref <18)
Troponin I (High Sensitivity): 5 ng/L (ref <18)

## 2022-01-12 LAB — BASIC METABOLIC PANEL
Anion gap: 12 (ref 5–15)
BUN: 25 mg/dL — ABNORMAL HIGH (ref 6–20)
CO2: 26 mmol/L (ref 22–32)
Calcium: 10.4 mg/dL — ABNORMAL HIGH (ref 8.9–10.3)
Chloride: 96 mmol/L — ABNORMAL LOW (ref 98–111)
Creatinine, Ser: 0.88 mg/dL (ref 0.44–1.00)
GFR, Estimated: >60 mL/min (ref >60)
Glucose, Bld: 376 mg/dL — ABNORMAL HIGH (ref 70–99)
Potassium: 3.9 mmol/L (ref 3.5–5.1)
Sodium: 134 mmol/L — ABNORMAL LOW (ref 135–145)

## 2022-01-12 LAB — CBC
HCT: 42.7 % (ref 36.0–46.0)
Hemoglobin: 14.4 g/dL (ref 12.0–15.0)
MCH: 29.6 pg (ref 26.0–34.0)
MCHC: 33.7 g/dL (ref 30.0–36.0)
MCV: 87.7 fL (ref 80.0–100.0)
Platelets: 304 10*3/uL (ref 150–400)
RBC: 4.87 MIL/uL (ref 3.87–5.11)
RDW: 13.2 % (ref 11.5–15.5)
WBC: 9.7 10*3/uL (ref 4.0–10.5)
nRBC: 0 % (ref 0.0–0.2)

## 2022-01-12 LAB — CBG MONITORING, ED: Glucose-Capillary: 261 mg/dL — ABNORMAL HIGH (ref 70–99)

## 2022-01-12 IMAGING — DX DG CHEST 2V
2 series · 2 of 2 positions shown · non-contrast
Comparison: [DATE]

CLINICAL DATA: Chest pain

EXAM:
CHEST - 2 VIEW

[chest pa]
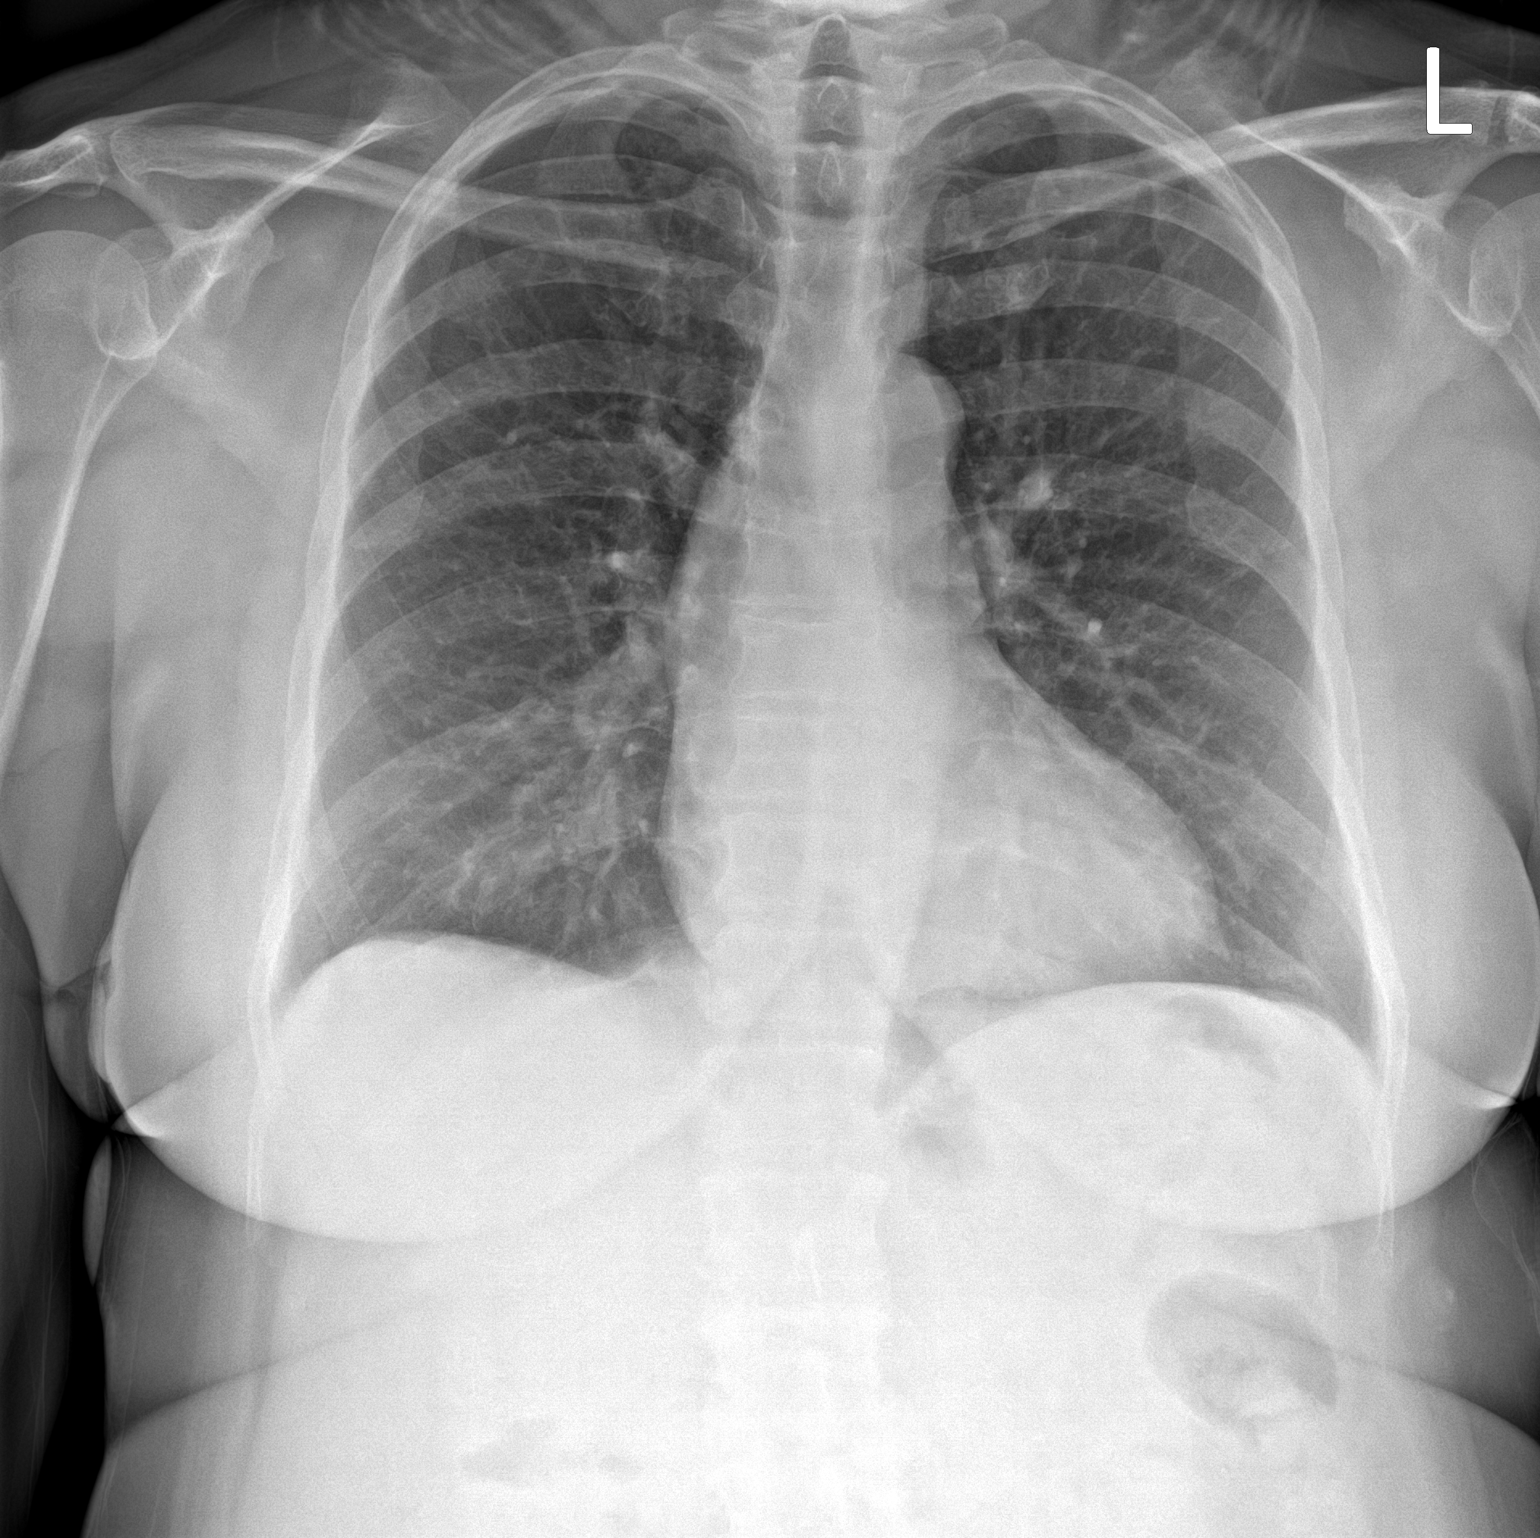

[chest lat]
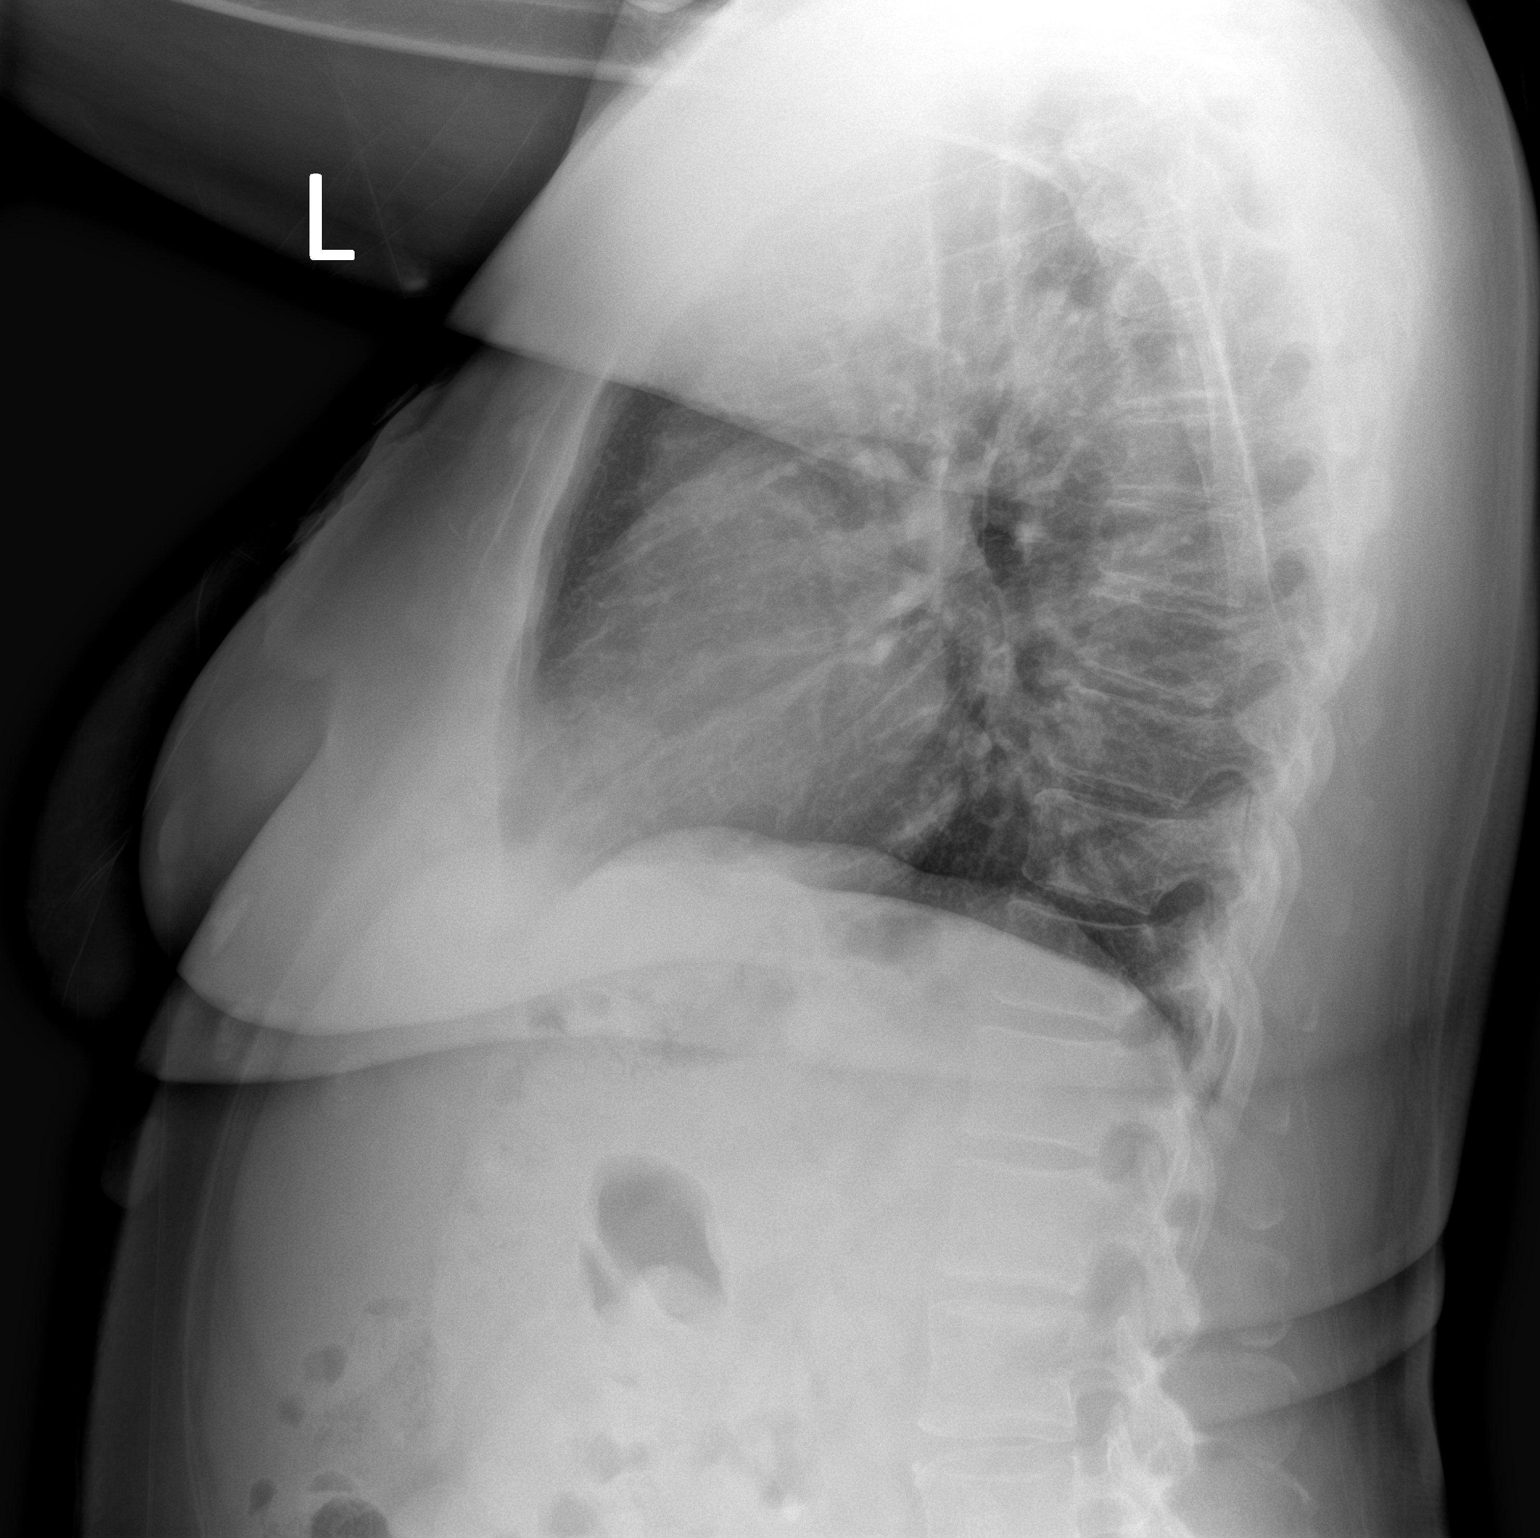

[2 of 2 positions shown; findings below may reference images not displayed]

FINDINGS: Lungs are clear.  No pleural effusion or pneumothorax.

The heart is normal in size.

Visualized osseous structures are within normal limits.
IMPRESSION: Normal chest radiographs.

## 2022-01-12 MED ORDER — INSULIN ASPART 100 UNIT/ML IJ SOLN
8.0000 [IU] | Freq: Once | INTRAMUSCULAR | Status: AC
  Administered 2022-01-12: 8 [IU] via INTRAVENOUS

## 2022-01-12 NOTE — ED Triage Notes (Incomplete)
Onset since Friday night multiple complaints.  States been having high blood pressures; chest pain palpations.  States been having high blood surgery

## 2022-01-12 NOTE — ED Provider Notes (Signed)
[facility] EMERGENCY DEPT Provider Note   CSN: [id number] Arrival date & time: 01/12/22  1732     History  Chief Complaint  Patient presents with   Chest Pain   Hyperglycemia    Zamariah R Filion is a 51 y.o. female.   Chest Pain Associated symptoms: back pain and shortness of breath   Associated symptoms: no weakness   Hyperglycemia Associated symptoms: chest pain and shortness of breath   Associated symptoms: no confusion and no weakness   Patient presents with anterior chest pain.  Dull.  Also of low back pain.  Does a history of back pain.  No nausea or vomiting.  No cough.  Took a baclofen without relief.  States she has fibroids on her chest and sometimes it will hurt.  Pain has not been exertional.  Began a few days ago.  States sugars have been running a little high.  At times will feel little short of breath but pain is not pleuritic.  No swelling in her legs.  No known cardiac history    Home Medications Prior to Admission medications   Medication Sig Start Date End Date Taking? Authorizing Provider  baclofen (LIORESAL) 10 MG tablet Take 1 tablet (10 mg total) by mouth daily as needed for muscle spasms. 01/19/21   Sater, Nanine Means, MD  cyclobenzaprine (FLEXERIL) 5 MG tablet Take one po qd prn 01/19/21   Sater, Nanine Means, MD  fluticasone (FLONASE) 50 MCG/ACT nasal spray Place 2 sprays into both nostrils daily. 11/02/20   Hughie Closs, PA-C  Insulin Glargine (BASAGLAR KWIKPEN) 100 UNIT/ML INJECT 0.4 ML UNDER THE SKIN DAILY. WITH PEN NEEDLES. INCREASE 2 UNITS AS DIRECTED 04/03/21   Panosh, Standley Brooking, MD  metFORMIN (GLUCOPHAGE) 500 MG tablet TAKE 4 TABLETS BY MOUTH EVERY DAY Needs appointment for refills. 06/17/20   Panosh, Standley Brooking, MD  Olmesartan-amLODIPine-HCTZ 40-5-25 MG TABS Take 1 tablet by mouth daily. 06/08/21   Panosh, Standley Brooking, MD  rosuvastatin (CRESTOR) 10 MG tablet Take 1 tablet (10 mg total) by mouth daily. 06/08/21   Panosh, Standley Brooking, MD  vitamin C  (VITAMIN C) 500 MG tablet Take 1 tablet (500 mg total) by mouth daily. Please take for 2 weeks 01/21/19   Elgergawy, Silver Huguenin, MD  zinc sulfate 220 (50 Zn) MG capsule Take 1 capsule (220 mg total) by mouth daily. Please take for 2 weeks 01/21/19   Elgergawy, Silver Huguenin, MD      Allergies    Tylox [oxycodone-acetaminophen]    Review of Systems   Review of Systems  Constitutional:  Negative for appetite change.  Respiratory:  Positive for shortness of breath.   Cardiovascular:  Positive for chest pain. Negative for leg swelling.  Musculoskeletal:  Positive for back pain.  Skin:  Negative for rash.  Neurological:  Negative for weakness.  Psychiatric/Behavioral:  Negative for confusion.    Physical Exam Updated Vital Signs BP (!) 116/91   Pulse 94   Temp 98.2 F (36.8 C) (Oral)   Resp (!) 21   SpO2 97%  Physical Exam Vitals reviewed.  Cardiovascular:     Rate and Rhythm: Normal rate and regular rhythm.  Pulmonary:     Breath sounds: No wheezing.  Chest:     Chest wall: Tenderness present.     Comments: Mild anterior chest tenderness.  No rash.  No crepitance Abdominal:     Tenderness: There is no abdominal tenderness.  Musculoskeletal:     Cervical back: Neck supple.  Right lower leg: No edema.     Left lower leg: No edema.  Neurological:     Mental Status: She is alert.    ED Results / Procedures / Treatments   Labs (all labs ordered are listed, but only abnormal results are displayed) Labs Reviewed  BASIC METABOLIC PANEL - Abnormal; Notable for the following components:      Result Value   Sodium 134 (*)    Chloride 96 (*)    Glucose, Bld 376 (*)    BUN 25 (*)    Calcium 10.4 (*)    All other components within normal limits  CBG MONITORING, ED - Abnormal; Notable for the following components:   Glucose-Capillary 261 (*)    All other components within normal limits  CBC  TROPONIN I (HIGH SENSITIVITY)  TROPONIN I (HIGH SENSITIVITY)    EKG EKG  Interpretation  Date/Time:  Tuesday Jan 12 2022 19:51:19 EDT Ventricular Rate:  92 PR Interval:  149 QRS Duration: 91 QT Interval:  363 QTC Calculation: 449 R Axis:   1 Text Interpretation: Sinus rhythm Left ventricular hypertrophy Confirmed by Davonna Belling [phone]) on 01/12/2022 8:09:53 PM  Radiology DG Chest 2 View  Result Date: 01/12/2022 CLINICAL DATA:  Chest pain EXAM: CHEST - 2 VIEW COMPARISON:  01/16/2019 FINDINGS: Lungs are clear.  No pleural effusion or pneumothorax. The heart is normal in size. Visualized osseous structures are within normal limits. IMPRESSION: Normal chest radiographs. Electronically Signed   By: Julian Hy M.D.   On: 01/12/2022 18:13    Procedures Procedures    Medications Ordered in ED Medications  insulin aspart (novoLOG) injection 8 Units (8 Units Intravenous Given 01/12/22 1944)    ED Course/ Medical Decision Making/ A&P                           Medical Decision Making Amount and/or Complexity of Data Reviewed Labs: ordered. Radiology: ordered.  Risk Prescription drug management.   Patient presents with chest pain and mild dizziness.  Anterior chest pain.  EKG reassuring.  Troponin negative x2.  Doubt cardiac ischemia as a cause.  However sugar is elevated to close to 400.  Not in DKA.  Sugars improved somewhat.  Feeling better.  Will adjust her medicines at home as needed will call PCP.  Doubt pulmonary embolism.  Appears stable for discharge home.  Will also orally hydrate herself.  Will discharge home        Final Clinical Impression(s) / ED Diagnoses Final diagnoses:  Nonspecific chest pain  Hyperglycemia    Rx / DC Orders ED Discharge Orders     None         Davonna Belling, MD 01/12/22 2325

## 2022-01-12 NOTE — ED Triage Notes (Signed)
Pt presents to ED POV for Left side chest pain into Left arm, high BP, low back pain and hyperglycemic x5 days. Pt denies N/V/D, cough or SOB

## 2022-07-14 ENCOUNTER — Encounter: Payer: BC Managed Care – PPO | Admitting: Internal Medicine

## 2022-08-30 ENCOUNTER — Telehealth: Payer: Self-pay | Admitting: Internal Medicine

## 2022-08-30 ENCOUNTER — Other Ambulatory Visit: Payer: Self-pay | Admitting: Internal Medicine

## 2022-08-30 NOTE — Telephone Encounter (Signed)
Pt called to request a refill of the following:  Olmesartan-amLODIPine-HCTZ 40-5-25 MG TABS  (Pt is completely out of this one)  rosuvastatin (CRESTOR) 10 MG tablet  Insulin Glargine (BASAGLAR KWIKPEN) 100 UNIT/ML   LOV:  06/08/21  Pt offered an OV and states she would call back when MD returns to office, to schedule with her.  Please advise.  [id number] [address], [place] Phone: [phone]  Fax: [phone]

## 2022-08-30 NOTE — Telephone Encounter (Signed)
Pt has been scheduled to see Dr. Elease Hashimoto on 09/06/22. Pt is completely out of her blood pressure meds and is asking if MD could send even a few pills until she comes in next Monday?  Please advise.

## 2022-08-31 MED ORDER — OLMESARTAN-AMLODIPINE-HCTZ 40-5-25 MG PO TABS: 1.0000 | ORAL_TABLET | Freq: Every day | ORAL | 0 refills | Status: DC

## 2022-08-31 NOTE — Addendum Note (Signed)
Addended by: Nilda Riggs on: 08/31/2022 11:12 AM   Modules accepted: Orders

## 2022-08-31 NOTE — Telephone Encounter (Signed)
Rx sent 

## 2022-09-06 ENCOUNTER — Encounter: Payer: Self-pay | Admitting: Family Medicine

## 2022-09-06 ENCOUNTER — Ambulatory Visit: Payer: BC Managed Care – PPO | Admitting: Family Medicine

## 2022-09-06 VITALS — BP 144/80 | HR 103 | Temp 99.0°F | Ht 73.0 in | Wt 238.7 lb

## 2022-09-06 DIAGNOSIS — M25512 Pain in left shoulder: Secondary | ICD-10-CM

## 2022-09-06 DIAGNOSIS — E785 Hyperlipidemia, unspecified: Secondary | ICD-10-CM | POA: Diagnosis not present

## 2022-09-06 DIAGNOSIS — Z794 Long term (current) use of insulin: Secondary | ICD-10-CM

## 2022-09-06 DIAGNOSIS — E1165 Type 2 diabetes mellitus with hyperglycemia: Secondary | ICD-10-CM

## 2022-09-06 DIAGNOSIS — I10 Essential (primary) hypertension: Secondary | ICD-10-CM | POA: Diagnosis not present

## 2022-09-06 MED ORDER — ROSUVASTATIN CALCIUM 10 MG PO TABS: 10.0000 mg | ORAL_TABLET | Freq: Every day | ORAL | 3 refills | Status: AC

## 2022-09-06 MED ORDER — BASAGLAR KWIKPEN 100 UNIT/ML ~~LOC~~ SOPN: PEN_INJECTOR | SUBCUTANEOUS | 2 refills | Status: DC

## 2022-09-06 MED ORDER — OLMESARTAN-AMLODIPINE-HCTZ 40-5-25 MG PO TABS: 1.0000 | ORAL_TABLET | Freq: Every day | ORAL | 3 refills | Status: DC

## 2022-09-06 MED ORDER — BACLOFEN 10 MG PO TABS: 10.0000 mg | ORAL_TABLET | Freq: Every day | ORAL | 4 refills | Status: AC | PRN

## 2022-09-06 NOTE — Patient Instructions (Signed)
Set up follow up with Dr Regis Bill soon.  Set up labs.

## 2022-09-06 NOTE — Progress Notes (Signed)
Established Patient Office Visit  Subjective   Patient ID: [name], female    DOB: Mar 08, 1971  Age: 52 y.o. MRN: [id number]  Chief Complaint  Patient presents with   Medication Refill   Shoulder Pain    Patient complains of left shoulder pain, x3 weeks, [place] denies any known injury     HPI   [name] is seen basically needing multiple medication refills in the absence of her primary provider.  She also has acute issue of left shoulder pain as below.  Her past medical history significant for hypertension, obstructive sleep apnea, type 2 diabetes, dyslipidemia.  She relates that she had transverse myelitis following COVID couple years ago but thankfully is ambulating without difficulty at this time.  She is overdue for lab work.  Medications reviewed.  She takes baclofen intermittently for muscle spasms which she has had in her back since the transverse myelitis.  Takes Basaglar 10 units once daily.  Other medications include metformin, Crestor, and olmesartan-amlodipine-HCTZ She states she is not fasting today and prefers to get labs in a week or so fasting  She also relates acute issue of left shoulder pain for about 3 weeks.  Denies any injury.  Right-hand-dominant.  Has severe pain with abduction greater than just about 70 degrees.  Losing range of motion.  Denies any upper extremity numbness or weakness.  Increased night pain.  Sounds like she has had adhesive capsulitis previously of the right shoulder  Past Medical History:  Diagnosis Date   Diabetes mellitus without complication ([place])    Family history of breast cancer    Family history of colon cancer    Hypertension    Hypoglycemia    Radiation 10/21/2015-10/23/2015   Anterior neck area 12 gray in 3 fractions   Sleep apnea    Teratoma    Transverse myelitis ([place])    Past Surgical History:  Procedure Laterality Date   ABDOMINAL HERNIA REPAIR     ABDOMINAL HYSTERECTOMY     CESAREAN SECTION     CRYOTHERAPY   2013   to neck   DERMOID CYST  EXCISION     ECTOPIC PREGNANCY SURGERY     EXCISION MASS NECK Bilateral 10/20/2015   Procedure: EXCISION OF LARGE KELOIDS SEVERE RIGHT AND LEFT NECK WITH PLASTIC RECONSTRUCTION;  Surgeon: Cristine Polio, MD;  Location: [facility];  Service: Plastics;  Laterality: Bilateral;   HAND SURGERY     amputation of right index finger    reports that she has been smoking cigars. She has never used smokeless tobacco. She reports current alcohol use of about 1.0 standard drink of alcohol per week. She reports that she does not use drugs. family history includes Breast cancer in her maternal aunt; Colon cancer (age of onset: 52) in her maternal grandmother; Diabetes in her father and mother; Heart failure in her father; Hypertension in her mother and another family member; Thyroid disease in her father. Allergies  Allergen Reactions   Tylox [Oxycodone-Acetaminophen] Swelling    Caused face to swell.  Patient stated this happened a long time ago and since then has taken oxycodone with no problems    Review of Systems  Constitutional:  Negative for chills, fever and malaise/fatigue.  Eyes:  Negative for blurred vision.  Respiratory:  Negative for shortness of breath.   Cardiovascular:  Negative for chest pain.  Neurological:  Negative for dizziness, weakness and headaches.      Objective:     BP Marland Kitchen)  144/80 (BP Location: Right Arm, Patient Position: Sitting, Cuff Size: Large)   Pulse (!) 103   Temp 99 F (37.2 C) (Oral)   Ht '6\' 1"'$  (1.854 m)   Wt 238 lb 11.2 oz (108.3 kg)   SpO2 98%   BMI 31.49 kg/m    Physical Exam Vitals reviewed.  Constitutional:      Appearance: She is well-developed.  Eyes:     Pupils: Pupils are equal, round, and reactive to light.  Neck:     Thyroid: No thyromegaly.     Vascular: No JVD.  Cardiovascular:     Rate and Rhythm: Normal rate and regular rhythm.     Heart sounds:     No gallop.  Pulmonary:      Effort: Pulmonary effort is normal. No respiratory distress.     Breath sounds: Normal breath sounds. No wheezing or rales.  Musculoskeletal:     Cervical back: Neck supple.     Comments: Left shoulder reveals no localized tenderness.  She has pain with internal rotation and abduction greater than just 60 to 70 degrees.  He is unable to abduct her left shoulder greater than about 70 degrees without assistance from her right upper extremity  Neurological:     Mental Status: She is alert.      No results found for any visits on 09/06/22.    The 10-year ASCVD risk score (Arnett DK, et al., 2019) is: 24.6%    Assessment & Plan:   Problem List Items Addressed This Visit       Unprioritized   Type 2 diabetes mellitus with hyperglycemia (HCC)   Relevant Medications   Olmesartan-amLODIPine-HCTZ 40-5-25 MG TABS   Insulin Glargine (BASAGLAR KWIKPEN) 100 UNIT/ML   rosuvastatin (CRESTOR) 10 MG tablet   Other Relevant Orders   Hemoglobin A1c   Microalbumin / creatinine urine ratio   Dyslipidemia   Relevant Medications   rosuvastatin (CRESTOR) 10 MG tablet   Other Relevant Orders   Lipid panel   CMP   Essential hypertension, benign - Primary   Relevant Medications   Olmesartan-amLODIPine-HCTZ 40-5-25 MG TABS   rosuvastatin (CRESTOR) 10 MG tablet   Other Visit Diagnoses     Acute pain of left shoulder       Relevant Orders   Ambulatory referral to Sports Medicine     -Patient needs refills of several medications until she can get back in to see primary including blood pressure ,cholesterol, and diabetes medications.  These were all refilled -Overdue for labs.  Future lab order placed for lipid, CMP, A1c, and urine microalbumin screen.  She will try to set these up for the next week or so -Recommend she set up follow-up with primary soon to reassess  Regarding her left shoulder pain increased risk for adhesive capsulitis.  Recommend getting into sports medicine and she agrees.   Referral placed.  No follow-ups on file.    Carolann Littler, MD

## 2022-09-14 ENCOUNTER — Encounter: Payer: BC Managed Care – PPO | Admitting: Internal Medicine

## 2022-09-16 NOTE — Progress Notes (Signed)
Theresa Mccreedy D.[place] Owen [name] Phone: [phone]   Assessment and Plan:     1. Chronic left shoulder pain 2. Subacromial bursitis of left shoulder joint -Subacute, worsening, initial sports medicine visit - Most consistent with subacromial bursitis based on HPI, physical exam.  At risk of developing into frozen shoulder with past medical history DM type II - Start HEP for rotator cuff - Start meloxicam 15 mg daily x2 weeks.  If still having pain after 2 weeks, complete 3rd-week of meloxicam. May use remaining meloxicam as needed once daily for pain control.  Do not to use additional NSAIDs while taking meloxicam.  May use Tylenol [phone] 2 to 3 times a day for breakthrough pain.  Other orders - meloxicam (MOBIC) 15 MG tablet; Take 1 tablet (15 mg total) by mouth daily.    Pertinent previous records reviewed include family medicine note 09/06/2022   Follow Up: 3 weeks for reevaluation.  If no improvement or worsening of symptoms, would consider subacromial CSI versus physical therapy   Subjective:   I, [name], am serving as a Education administrator for Doctor Theresa Mac  Chief Complaint: left shoulder pain   HPI:   09/20/2022 Patient is a 52 year old female complaining of left shoulder pain . Patient states that she has decreased ROM, heat helps, pain all the time, been going on since christmas , is an avid gym goer, no numbness or tingling, intermittent tylenol, massage helps, ist able to lift things with her arm,   Relevant Historical Information: DM type II, hypertension, history of keloids  Additional pertinent review of systems negative.   Current Outpatient Medications:    baclofen (LIORESAL) 10 MG tablet, Take 1 tablet (10 mg total) by mouth daily as needed for muscle spasms., Disp: 90 each, Rfl: 4   cyclobenzaprine (FLEXERIL) 5 MG tablet, Take one po qd prn, Disp: 90 tablet, Rfl: 4   fluticasone  (FLONASE) 50 MCG/ACT nasal spray, Place 2 sprays into both nostrils daily., Disp: 16 mL, Rfl: 0   Insulin Glargine (BASAGLAR KWIKPEN) 100 UNIT/ML, INJECT 10 UNITS UNDER THE SKIN DAILY, WITH PEN NEEDLES OR AS DIRECTED, Disp: 15 mL, Rfl: 2   meloxicam (MOBIC) 15 MG tablet, Take 1 tablet (15 mg total) by mouth daily., Disp: 30 tablet, Rfl: 0   metFORMIN (GLUCOPHAGE) 500 MG tablet, TAKE 4 TABLETS BY MOUTH EVERY DAY Needs appointment for refills., Disp: 120 tablet, Rfl: 0   Olmesartan-amLODIPine-HCTZ 40-5-25 MG TABS, Take 1 tablet by mouth daily., Disp: 90 tablet, Rfl: 3   rosuvastatin (CRESTOR) 10 MG tablet, Take 1 tablet (10 mg total) by mouth daily., Disp: 90 tablet, Rfl: 3   vitamin C (VITAMIN C) 500 MG tablet, Take 1 tablet (500 mg total) by mouth daily. Please take for 2 weeks, Disp: , Rfl:    zinc sulfate 220 (50 Zn) MG capsule, Take 1 capsule (220 mg total) by mouth daily. Please take for 2 weeks, Disp: , Rfl:    Objective:     Vitals:   09/20/22 0926  BP: 132/80  Pulse: (!) 101  SpO2: 95%  Weight: 238 lb (108 kg)  Height: '6\' 1"'$  (1.854 m)      Body mass index is 31.4 kg/m.    Physical Exam:    Gen: Appears well, nad, nontoxic and pleasant Neuro:sensation intact, strength is 5/5 with df/pf/inv/ev, muscle tone wnl Skin: no suspicious lesion or defmority Psych: A&O, appropriate mood and  affect  Left shoulder:  No deformity, swelling or muscle wasting No scapular winging FF 80, abd 50, int 30, ext 70 TTP globally including over the [place], clavicle, ac, coracoid, biceps groove, humerus, deltoid, NTTP trapezius, cervical spine Special testing limited due to patient's decreased ROM and pain negative Spurling's test bilat FROM of neck    Electronically signed by:  Theresa Mccreedy D.[facility] 10:03 AM 09/20/22

## 2022-09-20 ENCOUNTER — Ambulatory Visit: Payer: BC Managed Care – PPO | Admitting: Sports Medicine

## 2022-09-20 ENCOUNTER — Other Ambulatory Visit (INDEPENDENT_AMBULATORY_CARE_PROVIDER_SITE_OTHER): Payer: BC Managed Care – PPO

## 2022-09-20 VITALS — BP 132/80 | HR 101 | Ht 73.0 in | Wt 238.0 lb

## 2022-09-20 DIAGNOSIS — M7552 Bursitis of left shoulder: Secondary | ICD-10-CM | POA: Diagnosis not present

## 2022-09-20 DIAGNOSIS — Z794 Long term (current) use of insulin: Secondary | ICD-10-CM | POA: Diagnosis not present

## 2022-09-20 DIAGNOSIS — M25512 Pain in left shoulder: Secondary | ICD-10-CM | POA: Diagnosis not present

## 2022-09-20 DIAGNOSIS — E1165 Type 2 diabetes mellitus with hyperglycemia: Secondary | ICD-10-CM

## 2022-09-20 DIAGNOSIS — E785 Hyperlipidemia, unspecified: Secondary | ICD-10-CM

## 2022-09-20 DIAGNOSIS — G8929 Other chronic pain: Secondary | ICD-10-CM | POA: Diagnosis not present

## 2022-09-20 LAB — MICROALBUMIN / CREATININE URINE RATIO
Creatinine,U: 196.1 mg/dL
Microalb Creat Ratio: 1.2 mg/g (ref 0.0–30.0)
Microalb, Ur: 2.3 mg/dL — ABNORMAL HIGH (ref 0.0–1.9)

## 2022-09-20 LAB — LIPID PANEL
Cholesterol: 226 mg/dL — ABNORMAL HIGH (ref 0–200)
HDL: 41.7 mg/dL (ref >39.00)
LDL Cholesterol: 156 mg/dL — ABNORMAL HIGH (ref 0–99)
NonHDL: 184.32
Total CHOL/HDL Ratio: 5
Triglycerides: 143 mg/dL (ref 0.0–149.0)
VLDL: 28.6 mg/dL (ref 0.0–40.0)

## 2022-09-20 LAB — COMPREHENSIVE METABOLIC PANEL
ALT: 18 U/L (ref 0–35)
AST: 12 U/L (ref 0–37)
Albumin: 4.3 g/dL (ref 3.5–5.2)
Alkaline Phosphatase: 123 U/L — ABNORMAL HIGH (ref 39–117)
BUN: 22 mg/dL (ref 6–23)
CO2: 27 mEq/L (ref 19–32)
Calcium: 9.7 mg/dL (ref 8.4–10.5)
Chloride: 99 mEq/L (ref 96–112)
Creatinine, Ser: 0.91 mg/dL (ref 0.40–1.20)
GFR: 72.66 mL/min (ref >60.00)
Glucose, Bld: 315 mg/dL — ABNORMAL HIGH (ref 70–99)
Potassium: 3.9 mEq/L (ref 3.5–5.1)
Sodium: 136 mEq/L (ref 135–145)
Total Bilirubin: 0.6 mg/dL (ref 0.2–1.2)
Total Protein: 7.5 g/dL (ref 6.0–8.3)

## 2022-09-20 LAB — HEMOGLOBIN A1C: Hgb A1c MFr Bld: 12.4 % — ABNORMAL HIGH (ref 4.6–6.5)

## 2022-09-20 MED ORDER — MELOXICAM 15 MG PO TABS: 15.0000 mg | ORAL_TABLET | Freq: Every day | ORAL | 0 refills | Status: DC

## 2022-09-20 NOTE — Patient Instructions (Signed)
Good to see you  Shoulder HEP  - Start meloxicam 15 mg daily x2 weeks.  If still having pain after 2 weeks, complete 3rd-week of meloxicam. May use remaining meloxicam as needed once daily for pain control.  Do not to use additional NSAIDs while taking meloxicam.  May use Tylenol [phone] mg 2 to 3 times a day for breakthrough pain. 3 week follow up

## 2022-09-22 ENCOUNTER — Telehealth: Payer: Self-pay | Admitting: Internal Medicine

## 2022-09-22 MED ORDER — BASAGLAR KWIKPEN 100 UNIT/ML ~~LOC~~ SOPN: PEN_INJECTOR | SUBCUTANEOUS | 2 refills | Status: DC

## 2022-09-22 NOTE — Telephone Encounter (Signed)
Patient returning cal for results

## 2022-09-23 NOTE — Telephone Encounter (Signed)
Attempt to reach pt. Left a voicemail to callback.  

## 2022-10-15 NOTE — Progress Notes (Unsigned)
    Benito Mccreedy D.[place] Phone: [phone]   Assessment and Plan:     There are no diagnoses linked to this encounter.  ***   Pertinent previous records reviewed include ***   Follow Up: ***     Subjective:   I, Fradel Baldonado, am serving as a Education administrator for Doctor Glennon Mac   Chief Complaint: left shoulder pain    HPI:    09/20/2022 Patient is a 52 year old female complaining of left shoulder pain . Patient states that she has decreased ROM, heat helps, pain all the time, been going on since christmas , is an avid gym goer, no numbness or tingling, intermittent tylenol, massage helps, ist able to lift things with her arm,    10/18/2022 Patient states   Relevant Historical Information: DM type II, hypertension, history of keloids  Additional pertinent review of systems negative.   Current Outpatient Medications:    baclofen (LIORESAL) 10 MG tablet, Take 1 tablet (10 mg total) by mouth daily as needed for muscle spasms., Disp: 90 each, Rfl: 4   cyclobenzaprine (FLEXERIL) 5 MG tablet, Take one po qd prn, Disp: 90 tablet, Rfl: 4   fluticasone (FLONASE) 50 MCG/ACT nasal spray, Place 2 sprays into both nostrils daily., Disp: 16 mL, Rfl: 0   Insulin Glargine (BASAGLAR KWIKPEN) 100 UNIT/ML, INJECT 15 UNITS UNDER THE SKIN DAILY, WITH PEN NEEDLES OR AS DIRECTED, Disp: 15 mL, Rfl: 2   meloxicam (MOBIC) 15 MG tablet, Take 1 tablet (15 mg total) by mouth daily., Disp: 30 tablet, Rfl: 0   metFORMIN (GLUCOPHAGE) 500 MG tablet, TAKE 4 TABLETS BY MOUTH EVERY DAY Needs appointment for refills., Disp: 120 tablet, Rfl: 0   Olmesartan-amLODIPine-HCTZ 40-5-25 MG TABS, Take 1 tablet by mouth daily., Disp: 90 tablet, Rfl: 3   rosuvastatin (CRESTOR) 10 MG tablet, Take 1 tablet (10 mg total) by mouth daily., Disp: 90 tablet, Rfl: 3   vitamin C (VITAMIN C) 500 MG tablet, Take 1 tablet (500 mg total) by mouth daily.  Please take for 2 weeks, Disp: , Rfl:    zinc sulfate 220 (50 Zn) MG capsule, Take 1 capsule (220 mg total) by mouth daily. Please take for 2 weeks, Disp: , Rfl:    Objective:     There were no vitals filed for this visit.    There is no height or weight on file to calculate BMI.    Physical Exam:    ***   Electronically signed by:  Benito Mccreedy D.[facility] 12:18 PM 10/15/22

## 2022-10-18 ENCOUNTER — Ambulatory Visit: Payer: BC Managed Care – PPO | Admitting: Sports Medicine

## 2022-10-18 VITALS — BP 138/88 | HR 91 | Ht 73.0 in | Wt 242.0 lb

## 2022-10-18 DIAGNOSIS — M25512 Pain in left shoulder: Secondary | ICD-10-CM | POA: Diagnosis not present

## 2022-10-18 DIAGNOSIS — G8929 Other chronic pain: Secondary | ICD-10-CM | POA: Diagnosis not present

## 2022-10-18 DIAGNOSIS — M7552 Bursitis of left shoulder: Secondary | ICD-10-CM

## 2022-10-18 NOTE — Patient Instructions (Addendum)
Good to see you  3-4 week follow up  PT referral

## 2022-11-11 NOTE — Progress Notes (Signed)
[name] D.[place] Phone: [phone]   Assessment and Plan:     1. Chronic left shoulder pain 2. Subacromial bursitis of left shoulder joint  -Chronic with exacerbation, subsequent visit - Overall moderate improvement in ROM and decrease in pain since subacromial CSI at previous office visit on 10/18/2022 and starting physical therapy, however patient continues to have pain in shoulder and limited ROM - Recommend continuing HEP and physical therapy - Start Tylenol 650mg  tablets 2-3 times a day for day-to-day pain relief - May use ibuprofen or naproxen as needed for breakthrough pain.  Recommend not using any more than 1-2 times per week.     Pertinent previous records reviewed include none   Follow Up: 4 to 5 weeks for reevaluation.  If no improvement or worsening of symptoms, could consider advanced imaging   Subjective:   I, [place], am serving as a Education administrator for Doctor Glennon Mac   Chief Complaint: left shoulder pain    HPI:    09/20/2022 Patient is a 52 year old female complaining of left shoulder pain . Patient states that she has decreased ROM, heat helps, pain all the time, been going on since christmas , is an avid gym goer, no numbness or tingling, intermittent tylenol, massage helps, ist able to lift things with her arm,    10/18/2022 Patient states she struggles in the am and pm , has increased ROM but still hurts , meloxicam doesn't make a difference she can feel    11/15/2022 Patient states that she is the same , PT thinks its rotator cuff     Relevant Historical Information: DM type II, hypertension, history of keloids  Additional pertinent review of systems negative.   Current Outpatient Medications:    baclofen (LIORESAL) 10 MG tablet, Take 1 tablet (10 mg total) by mouth daily as needed for muscle spasms., Disp: 90 each, Rfl: 4   cyclobenzaprine (FLEXERIL) 5 MG tablet,  Take one po qd prn, Disp: 90 tablet, Rfl: 4   fluticasone (FLONASE) 50 MCG/ACT nasal spray, Place 2 sprays into both nostrils daily., Disp: 16 mL, Rfl: 0   Insulin Glargine (BASAGLAR KWIKPEN) 100 UNIT/ML, INJECT 15 UNITS UNDER THE SKIN DAILY, WITH PEN NEEDLES OR AS DIRECTED, Disp: 15 mL, Rfl: 2   meloxicam (MOBIC) 15 MG tablet, Take 1 tablet (15 mg total) by mouth daily., Disp: 30 tablet, Rfl: 0   metFORMIN (GLUCOPHAGE) 500 MG tablet, TAKE 4 TABLETS BY MOUTH EVERY DAY Needs appointment for refills., Disp: 120 tablet, Rfl: 0   Olmesartan-amLODIPine-HCTZ 40-5-25 MG TABS, Take 1 tablet by mouth daily., Disp: 90 tablet, Rfl: 3   rosuvastatin (CRESTOR) 10 MG tablet, Take 1 tablet (10 mg total) by mouth daily., Disp: 90 tablet, Rfl: 3   vitamin C (VITAMIN C) 500 MG tablet, Take 1 tablet (500 mg total) by mouth daily. Please take for 2 weeks, Disp: , Rfl:    zinc sulfate 220 (50 Zn) MG capsule, Take 1 capsule (220 mg total) by mouth daily. Please take for 2 weeks, Disp: , Rfl:    Objective:     Vitals:   11/15/22 1437  BP: 108/80  Pulse: (!) 101  SpO2: 97%  Weight: 242 lb (109.8 kg)  Height: 6\' 1"  (1.854 m)      Body mass index is 31.93 kg/m.    Physical Exam:    Gen: Appears well, nad, nontoxic and pleasant Neuro:sensation intact, strength  is 5/5 with df/pf/inv/ev, muscle tone wnl Skin: no suspicious lesion or defmority Psych: A&O, appropriate mood and affect   Left shoulder:  No deformity, swelling or muscle wasting No scapular winging FF 160, abd 140, int 10, ext 80 TTP mildly globally including over the [place], clavicle, ac, coracoid, biceps groove, humerus, deltoid, NTTP trapezius, cervical spine Positive empty can  negative Spurling's test bilat FROM of neck     Electronically signed by:  [name] D.[facility] 2:48 PM 11/15/22

## 2022-11-15 ENCOUNTER — Ambulatory Visit: Payer: BC Managed Care – PPO | Attending: Sports Medicine

## 2022-11-15 ENCOUNTER — Ambulatory Visit (INDEPENDENT_AMBULATORY_CARE_PROVIDER_SITE_OTHER): Payer: BC Managed Care – PPO | Admitting: Sports Medicine

## 2022-11-15 ENCOUNTER — Other Ambulatory Visit: Payer: Self-pay

## 2022-11-15 VITALS — BP 108/80 | HR 101 | Ht 73.0 in | Wt 242.0 lb

## 2022-11-15 DIAGNOSIS — M7552 Bursitis of left shoulder: Secondary | ICD-10-CM

## 2022-11-15 DIAGNOSIS — M25512 Pain in left shoulder: Secondary | ICD-10-CM | POA: Diagnosis not present

## 2022-11-15 DIAGNOSIS — R600 Localized edema: Secondary | ICD-10-CM

## 2022-11-15 DIAGNOSIS — M6281 Muscle weakness (generalized): Secondary | ICD-10-CM | POA: Insufficient documentation

## 2022-11-15 DIAGNOSIS — G8929 Other chronic pain: Secondary | ICD-10-CM | POA: Diagnosis not present

## 2022-11-15 NOTE — Therapy (Signed)
OUTPATIENT PHYSICAL THERAPY UPPER EXTREMITY EVALUATION   Patient Name: [name] MRN: [id number] DOB:Feb 25, 1971, 52 y.o., female Today's Date: 11/15/2022  END OF SESSION:  PT End of Session - 11/15/22 1115     Visit Number 1    Number of Visits 13    Date for PT Re-Evaluation 01/01/23    Authorization Type BCBS    PT Start Time 1115   patient late   PT Stop Time 1159    PT Time Calculation (min) 44 min    Activity Tolerance Patient limited by pain    Behavior During Therapy [facility] for tasks assessed/performed             Past Medical History:  Diagnosis Date   Diabetes mellitus without complication    Family history of breast cancer    Family history of colon cancer    Hypertension    Hypoglycemia    Radiation 10/21/2015-10/23/2015   Anterior neck area 12 gray in 3 fractions   Sleep apnea    Teratoma    Transverse myelitis    Past Surgical History:  Procedure Laterality Date   ABDOMINAL HERNIA REPAIR     ABDOMINAL HYSTERECTOMY     CESAREAN SECTION     CRYOTHERAPY  2013   to neck   DERMOID CYST  EXCISION     ECTOPIC PREGNANCY SURGERY     EXCISION MASS NECK Bilateral 10/20/2015   Procedure: EXCISION OF LARGE KELOIDS SEVERE RIGHT AND LEFT NECK WITH PLASTIC RECONSTRUCTION;  Surgeon: Cristine Polio, MD;  Location: [facility];  Service: Plastics;  Laterality: Bilateral;   HAND SURGERY     amputation of right index finger   Patient Active Problem List   Diagnosis Date Noted   Type 2 diabetes mellitus with hyperglycemia 09/06/2022   Genetic testing 12/17/2020   Family history of breast cancer    Family history of colon cancer    Cervical spinal stenosis 11/19/2019   Gait disturbance 05/21/2019   Transverse myelitis 03/13/2019   Numbness 02/13/2019   Right leg weakness 02/13/2019   Lower extremity weakness 01/16/2019   Other incomplete lesion at T10 level of thoracic spinal cord, initial encounter ([place]) 01/16/2019   Pneumonia due to COVID-19  virus 01/16/2019   Notalgia 08/10/2017   Morbid obesity 02/18/2017   OSA (obstructive sleep apnea) 01/24/2017   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   Snoring 10/22/2016   Nocturnal dyspnea/choking   10/22/2016   Keloid of skin 09/25/2015   Essential hypertension, benign 09/27/2014   Teratoma 08/07/2014   Severe hypertension 06/08/2013   Dyslipidemia 07/23/2009   SHORTNESS OF BREATH 02/11/2009   HYPERTENSION 05/07/2008   KELOID SCAR 05/07/2008   HEADACHE 05/22/2007    PCP: Burnis Medin, MD  REFERRING PROVIDER: Glennon Mac, DO  REFERRING DIAG:  [phone] (ICD-10-CM) - Chronic left shoulder pain  M75.52 (ICD-10-CM) - Subacromial bursitis of left shoulder joint    THERAPY DIAG:  Chronic left shoulder pain  Muscle weakness (generalized)  Localized edema  Rationale for Evaluation and Treatment: Rehabilitation  ONSET DATE: December 2023   SUBJECTIVE:  SUBJECTIVE STATEMENT: Patient reports onset of left shoulder pain around Christmas without known cause. She reports the pain is about the superolateral shoulder described as a dull ache. She had a recent injection that provided some pain relief for about a week. She can raise her arm more than she was able to before, but this is still limited and painful. She has been completing exercises prescribed by Dr. Glennon Mac that has helped improved her motion. Sleep is disturbed due to shoulder pain, waking up a couple times a night due to pain. She denies any numbness or tingling. She denies any popping/clicking. She denies any previous injury to her shoulder, but has undergone previous surgery and radiation in 2017 to her neck.   Hand dominance: Right  PERTINENT HISTORY: Radiation anterior neck 2017 Transverse myelitis   Hypertension Diabetes   PAIN:  Are you having pain? Yes: NPRS scale: 5 (at worst 8)/10 Pain location: Lt superolateral shoulder Pain description: dull ache Aggravating factors: worse at night and with sleep; movement Relieving factors: heat, massage  PRECAUTIONS: Fall  WEIGHT BEARING RESTRICTIONS: No  FALLS:  Has patient fallen in last 6 months? No; reports near falls due to neuropathy when going up and down the stairs   LIVING ENVIRONMENT: Lives with: lives with their family Lives in: House/apartment Stairs: Yes: External: 2 flights steps; can reach both Has following equipment at home: Single point cane  OCCUPATION: Engineer, mining   PLOF: Independent  PATIENT GOALS: "be able to use it more regularly; alleviate a lot more of the pain without surgery"   NEXT MD VISIT: 11/15/22  OBJECTIVE:   DIAGNOSTIC FINDINGS:  None   PATIENT SURVEYS :  FOTO 44% function t 61% predicted   COGNITION: Overall cognitive status: Within functional limits for tasks assessed     SENSATION: Not tested  POSTURE: Forward head/rounded shoulders   UPPER EXTREMITY ROM:   Active ROM Right eval Left eval  Shoulder flexion  70 AROM; 120 PROM empty   Shoulder extension    Shoulder abduction  50; 90 PROM empty  Shoulder adduction    Shoulder internal rotation  WNL AROM  Shoulder external rotation  60 AROM; 66 PROM  Elbow flexion    Elbow extension    Wrist flexion    Wrist extension    Wrist ulnar deviation    Wrist radial deviation    Wrist pronation    Wrist supination    (Blank rows = not tested) pain with all AROM   UPPER EXTREMITY MMT:  MMT Right eval Left eval  Shoulder flexion  3- pn  Shoulder extension    Shoulder abduction  3- pn  Shoulder scaption  3- pn   Shoulder internal rotation 5 4+ pn  Shoulder external rotation 5 4- pn   Middle trapezius    Lower trapezius    Elbow flexion  5  Elbow extension    Wrist flexion    Wrist extension    Wrist  ulnar deviation    Wrist radial deviation    Wrist pronation    Wrist supination    Grip strength (lbs)    (Blank rows = not tested)  SHOULDER SPECIAL TESTS: Not assessed due to symptom irritability with ROM and strength testing.   JOINT MOBILITY TESTING:  Not assessed due to symptoms irritability with ROM and strength testing   PALPATION:  Diffuse tenderness about Lt posterior rotator cuff   [facility] Adult PT Treatment:  DATE: 11/15/22 Therapeutic Exercise: Demonstrated and issue initial HEP.   Therapeutic Activity: Education on assessment findings that will be addressed throughout duration of POC.   Self Care: Modalities for pain control    PATIENT EDUCATION: Education details: see treatment  Person educated: Patient Education method: Explanation, Demonstration, Tactile cues, Verbal cues, and Handouts Education comprehension: verbalized understanding, returned demonstration, verbal cues required, tactile cues required, and needs further education  HOME EXERCISE PROGRAM: Access Code: [email or web address] URL: [email or web address] Date: 11/15/2022 Prepared by: Gwendolyn Grant  Exercises - Standing Isometric Shoulder External Rotation with Doorway  - 2 x daily - 7 x weekly - 1 sets - 10 reps - 5 sec  hold - Standing Isometric Shoulder Internal Rotation at Doorway  - 2 x daily - 7 x weekly - 1 sets - 10 reps - 5 sec  hold - Seated Scapular Retraction  - 2 x daily - 7 x weekly - 2 sets - 10 reps - Seated Shoulder Flexion Towel Slide at Table Top  - 2 x daily - 7 x weekly - 1 sets - 10 reps - 5 sec  hold  ASSESSMENT:  CLINICAL IMPRESSION: Patient is a 52 y.o. female who was seen today for physical therapy evaluation and treatment for chronic Lt shoulder pain of insidious onset that began in December 2023. Her signs and symptoms are consistent with rotator cuff pathology with patient noted to have limited and painful shoulder  flexion, abduction, and ER AROM and pain and weakness with all MMT most notable with ER, flexion, and abduction. She will benefit from skilled PT to address the above stated deficits in order to optimize her function and assist in pain reduction.     OBJECTIVE IMPAIRMENTS: decreased activity tolerance, decreased knowledge of condition, decreased ROM, decreased strength, impaired UE functional use, postural dysfunction, and pain.   ACTIVITY LIMITATIONS: carrying, lifting, sleeping, reach over head, and hygiene/grooming  PARTICIPATION LIMITATIONS: meal prep, cleaning, laundry, shopping, community activity, occupation, and yard work  PERSONAL FACTORS: Age, Fitness, Profession, Time since onset of injury/illness/exacerbation, and 3+ comorbidities: see PMH above  are also affecting patient's functional outcome.   REHAB POTENTIAL: Good  CLINICAL DECISION MAKING: Stable/uncomplicated  EVALUATION COMPLEXITY: Low  GOALS: Goals reviewed with patient? Yes  SHORT TERM GOALS: Target date: 12/06/2022    Patient will be independent and compliant with initial HEP.   Baseline: issued at eval  Goal status: INITIAL  2.  Patient will demonstrate at least 100 degrees of Lt shoulder flexion AROM to improve ability to reach into cabinet at shoulder height.  Baseline: see above  Goal status: INITIAL  3.  Patient will report pain at worst rated as </= 5/10 to improve her sleep quality.  Baseline: see above  Goal status: INITIAL   LONG TERM GOALS: Target date: 01/01/2023    Patient will score at least 61% on FOTO to signify clinically meaningful improvement in functional abilities.   Baseline: see above  Goal status: INITIAL  2.  Patient will demonstrate at least 4/5 Lt shoulder strength to improve ability to lift and carry objects Baseline: see above  Goal status: INITIAL  3.  Patient will demonstrate at least 150 degrees of Lt shoulder flexion/abduction AROM to improve ability to reach into  overhead cabinets.  Baseline: see above  Goal status: INITIAL  4.  Patient will demonstrate at least 70 degrees of Lt shoulder ER AROM to improve ability to complete self-care activities.  Baseline: see above  Goal status: INITIAL  5.  Patient will be independent with advanced home program to progress/maintain current level of function.  Baseline: initial HEP issued  Goal status: INITIAL  PLAN: PT FREQUENCY: 1-2x/week  PT DURATION: 6 weeks  PLANNED INTERVENTIONS: Therapeutic exercises, Therapeutic activity, Neuromuscular re-education, Patient/Family education, Self Care, Joint mobilization, Aquatic Therapy, Dry Needling, Electrical stimulation, Cryotherapy, Moist heat, Vasopneumatic device, Ionotophoresis 4mg /ml Dexamethasone, Manual therapy, and Re-evaluation  PLAN FOR NEXT SESSION: review and progress HEP prn; progress Lt shoulder ROM and strengthening as tolerated; MD f/u?  Gwendolyn Grant, PT, DPT, ATC 11/15/22 12:12 PM

## 2022-11-15 NOTE — Patient Instructions (Signed)
Tylenol 650 mg 2-3 times a day for pain relief  Continue HEP and PT Follow up 5-6 weeks

## 2022-12-03 ENCOUNTER — Telehealth: Payer: Self-pay

## 2022-12-03 ENCOUNTER — Ambulatory Visit: Payer: BC Managed Care – PPO

## 2022-12-03 NOTE — Telephone Encounter (Signed)
LVM regarding missed PT appointment. Reminded patient of next scheduled visit and reviewed attendance policy.   Letitia Libra, PT, DPT, ATC 12/03/22 10:55 AM

## 2022-12-09 NOTE — Therapy (Signed)
OUTPATIENT PHYSICAL THERAPY TREATMENT NOTE   Patient Name: [name] MRN: [id number] DOB:1971-03-12, 52 y.o., female Today's Date: 12/10/2022  PCP: Madelin Headings, MD REFERRING PROVIDER: Richardean Sale, DO  END OF SESSION:   PT End of Session - 12/10/22 1014     Visit Number 2    Number of Visits 13    Date for PT Re-Evaluation 01/01/23    Authorization Type BCBS    PT Start Time 1015    PT Stop Time 1058    PT Time Calculation (min) 43 min    Activity Tolerance Patient limited by pain    Behavior During Therapy WFL for tasks assessed/performed             Past Medical History:  Diagnosis Date   Diabetes mellitus without complication (HCC)    Family history of breast cancer    Family history of colon cancer    Hypertension    Hypoglycemia    Radiation 10/21/2015-10/23/2015   Anterior neck area 12 gray in 3 fractions   Sleep apnea    Teratoma    Transverse myelitis (HCC)    Past Surgical History:  Procedure Laterality Date   ABDOMINAL HERNIA REPAIR     ABDOMINAL HYSTERECTOMY     CESAREAN SECTION     CRYOTHERAPY  2013   to neck   DERMOID CYST  EXCISION     ECTOPIC PREGNANCY SURGERY     EXCISION MASS NECK Bilateral 10/20/2015   Procedure: EXCISION OF LARGE KELOIDS SEVERE RIGHT AND LEFT NECK WITH PLASTIC RECONSTRUCTION;  Surgeon: Louisa Second, MD;  Location: [facility];  Service: Plastics;  Laterality: Bilateral;   HAND SURGERY     amputation of right index finger   Patient Active Problem List   Diagnosis Date Noted   Type 2 diabetes mellitus with hyperglycemia (HCC) 09/06/2022   Genetic testing 12/17/2020   Family history of breast cancer    Family history of colon cancer    Cervical spinal stenosis 11/19/2019   Gait disturbance 05/21/2019   Transverse myelitis (HCC) 03/13/2019   Numbness 02/13/2019   Right leg weakness 02/13/2019   Lower extremity weakness 01/16/2019   Other incomplete lesion at T10 level of thoracic spinal  cord, initial encounter ([facility]) 01/16/2019   Pneumonia due to COVID-19 virus 01/16/2019   Notalgia 08/10/2017   Morbid obesity (HCC) 02/18/2017   OSA (obstructive sleep apnea) 01/24/2017   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   Snoring 10/22/2016   Nocturnal dyspnea/choking   10/22/2016   Keloid of skin 09/25/2015   Essential hypertension, benign 09/27/2014   Teratoma 08/07/2014   Severe hypertension 06/08/2013   Dyslipidemia 07/23/2009   SHORTNESS OF BREATH 02/11/2009   HYPERTENSION 05/07/2008   KELOID SCAR 05/07/2008   HEADACHE 05/22/2007    REFERRING DIAG:  M25.512,G89.29 (ICD-10-CM) - Chronic left shoulder pain  M75.52 (ICD-10-CM) - Subacromial bursitis of left shoulder joint    THERAPY DIAG:  Chronic left shoulder pain  Muscle weakness (generalized)  Localized edema  Rationale for Evaluation and Treatment Rehabilitation  PERTINENT HISTORY: Radiation anterior neck 2017 Transverse myelitis  Hypertension Diabetes   PRECAUTIONS: fall   SUBJECTIVE:  SUBJECTIVE STATEMENT:  "So/so. I have been using the arm and I can sometimes get it up, but I'm in so much pain."    PAIN:  Are you having pain? Yes: NPRS scale: 5/10 Pain location: Lt superolateral shoulder Pain description: ache,sore, dull  Aggravating factors: worse at night and with sleep; movement Relieving factors: heat, massage   OBJECTIVE: (objective measures completed at initial evaluation unless otherwise dated)  DIAGNOSTIC FINDINGS:  None    PATIENT SURVEYS :  FOTO 44% function t 61% predicted    COGNITION: Overall cognitive status: Within functional limits for tasks assessed                                     SENSATION: Not tested   POSTURE: Forward head/rounded shoulders    UPPER EXTREMITY ROM:     Active ROM Right eval Left eval 12/10/22 Left   Shoulder flexion   70 AROM; 120 PROM empty  Unable active due to pain; 138 PROM, empty  Shoulder extension       Shoulder abduction   50; 90 PROM empty 60 AROM; 90 PROM, empty  Shoulder adduction       Shoulder internal rotation   WNL AROM WNL AROM  Shoulder external rotation   60 AROM; 66 PROM 65 AROM; 72 PROM empty  Elbow flexion       Elbow extension       Wrist flexion       Wrist extension       Wrist ulnar deviation       Wrist radial deviation       Wrist pronation       Wrist supination       (Blank rows = not tested) pain with all AROM    UPPER EXTREMITY MMT:   MMT Right eval Left eval  Shoulder flexion   3- pn  Shoulder extension      Shoulder abduction   3- pn  Shoulder scaption   3- pn   Shoulder internal rotation 5 4+ pn  Shoulder external rotation 5 4- pn   Middle trapezius      Lower trapezius      Elbow flexion   5  Elbow extension      Wrist flexion      Wrist extension      Wrist ulnar deviation      Wrist radial deviation      Wrist pronation      Wrist supination      Grip strength (lbs)      (Blank rows = not tested)   SHOULDER SPECIAL TESTS: Not assessed due to symptom irritability with ROM and strength testing.    JOINT MOBILITY TESTING:  Not assessed due to symptoms irritability with ROM and strength testing    PALPATION:  Diffuse tenderness about Lt posterior rotator cuff [facility] Adult PT Treatment:                                                DATE: 12/10/22 Therapeutic Exercise: Scapular retraction 2 x 10  Seated flexion towel slides x 10 Seated aduction towel slides x 10  Reactive ER/IR isometic red band 2 x 10 Updated HEP  Manual Therapy: Lt shoulder PROM to tolerance in all planes  [facility] Adult PT Treatment:                                                DATE: 11/15/22 Therapeutic Exercise: Demonstrated and issue initial HEP.    Therapeutic Activity: Education on  assessment findings that will be addressed throughout duration of POC.    Self Care: Modalities for pain control      PATIENT EDUCATION: Education details: see treatment  Person educated: Patient Education method: Explanation, Demonstration, Tactile cues, Verbal cues, and Handouts Education comprehension: verbalized understanding, returned demonstration, verbal cues required, tactile cues required, and needs further education   HOME EXERCISE PROGRAM: Access Code: [email or web address] URL: [email or web address] Date: 11/15/2022 Prepared by: Letitia Libra   Exercises - Standing Isometric Shoulder External Rotation with Doorway  - 2 x daily - 7 x weekly - 1 sets - 10 reps - 5 sec  hold - Standing Isometric Shoulder Internal Rotation at Doorway  - 2 x daily - 7 x weekly - 1 sets - 10 reps - 5 sec  hold - Seated Scapular Retraction  - 2 x daily - 7 x weekly - 2 sets - 10 reps - Seated Shoulder Flexion Towel Slide at Table Top  - 2 x daily - 7 x weekly - 1 sets - 10 reps - 5 sec  hold   ASSESSMENT:   CLINICAL IMPRESSION: Patient arrived for first PT session after almost a month since initial evaluation with ongoing Lt shoulder pain. She continues to exhibit full IR AROM, but is limited in all other planes both actively and passively due to pain. Fair tolerance to Lt shoulder PROM secondary to pain. Focused on AAROM and progression of isometric with fairly good tolerance. HEP was updated to include further AAROM and isometric strengthening.      OBJECTIVE IMPAIRMENTS: decreased activity tolerance, decreased knowledge of condition, decreased ROM, decreased strength, impaired UE functional use, postural dysfunction, and pain.    ACTIVITY LIMITATIONS: carrying, lifting, sleeping, reach over head, and hygiene/grooming   PARTICIPATION LIMITATIONS: meal prep, cleaning, laundry, shopping, community activity, occupation, and yard work   PERSONAL FACTORS: Age, Fitness, Profession, Time since  onset of injury/illness/exacerbation, and 3+ comorbidities: see PMH above  are also affecting patient's functional outcome.    REHAB POTENTIAL: Good   CLINICAL DECISION MAKING: Stable/uncomplicated   EVALUATION COMPLEXITY: Low   GOALS: Goals reviewed with patient? Yes   SHORT TERM GOALS: Target date: 12/06/2022       Patient will be independent and compliant with initial HEP.    Baseline: issued at eval  Goal status: INITIAL   2.  Patient will demonstrate at least 100 degrees of Lt shoulder flexion AROM to improve ability to reach into cabinet at shoulder height.  Baseline: see above  Goal status: INITIAL   3.  Patient will report pain at worst rated as </= 5/10 to improve her sleep quality.  Baseline: see above  Goal status: INITIAL     LONG TERM GOALS: Target date: 01/01/2023       Patient will score at least 61% on FOTO to signify clinically meaningful improvement in functional abilities.    Baseline: see above  Goal status: INITIAL   2.  Patient will demonstrate at least 4/5 Lt shoulder strength to improve ability to lift and carry objects Baseline: see above  Goal status: INITIAL   3.  Patient will demonstrate at least 150 degrees of Lt shoulder flexion/abduction AROM to improve ability to reach into overhead cabinets.  Baseline: see above  Goal status: INITIAL   4.  Patient will demonstrate at least 70 degrees of Lt shoulder ER AROM to improve ability to complete self-care activities.  Baseline: see above  Goal status: INITIAL   5.  Patient will be independent with advanced home program to progress/maintain current level of function.  Baseline: initial HEP issued  Goal status: INITIAL   PLAN: PT FREQUENCY: 1-2x/week   PT DURATION: 6 weeks   PLANNED INTERVENTIONS: Therapeutic exercises, Therapeutic activity, Neuromuscular re-education, Patient/Family education, Self Care, Joint mobilization, Aquatic Therapy, Dry Needling, Electrical stimulation,  Cryotherapy, Moist heat, Vasopneumatic device, Ionotophoresis 4mg /ml Dexamethasone, Manual therapy, and Re-evaluation   PLAN FOR NEXT SESSION: review and progress HEP prn; progress Lt shoulder ROM and strengthening as tolerated;  Letitia Libra, PT, DPT, ATC 12/10/22 10:59 AM

## 2022-12-10 ENCOUNTER — Ambulatory Visit: Payer: BC Managed Care – PPO

## 2022-12-10 DIAGNOSIS — G8929 Other chronic pain: Secondary | ICD-10-CM

## 2022-12-10 DIAGNOSIS — M25512 Pain in left shoulder: Secondary | ICD-10-CM | POA: Diagnosis not present

## 2022-12-10 DIAGNOSIS — M6281 Muscle weakness (generalized): Secondary | ICD-10-CM

## 2022-12-10 DIAGNOSIS — R600 Localized edema: Secondary | ICD-10-CM | POA: Diagnosis not present

## 2022-12-10 DIAGNOSIS — M7552 Bursitis of left shoulder: Secondary | ICD-10-CM | POA: Diagnosis not present

## 2022-12-16 NOTE — Therapy (Signed)
OUTPATIENT PHYSICAL THERAPY TREATMENT NOTE   Patient Name: [name] MRN: [id number] DOB:02/20/1971, 52 y.o., female Today's Date: 12/17/2022  PCP: Madelin Headings, MD REFERRING PROVIDER: Richardean Sale, DO  END OF SESSION:   PT End of Session - 12/17/22 1020     Visit Number 3    Number of Visits 13    Date for PT Re-Evaluation 01/01/23    Authorization Type [employer]    PT Start Time 1020    PT Stop Time 1059    PT Time Calculation (min) 39 min    Activity Tolerance Patient limited by pain    Behavior During Therapy WFL for tasks assessed/performed              Past Medical History:  Diagnosis Date   Diabetes mellitus without complication (HCC)    Family history of breast cancer    Family history of colon cancer    Hypertension    Hypoglycemia    Radiation 10/21/2015-10/23/2015   Anterior neck area 12 gray in 3 fractions   Sleep apnea    Teratoma    Transverse myelitis (HCC)    Past Surgical History:  Procedure Laterality Date   ABDOMINAL HERNIA REPAIR     ABDOMINAL HYSTERECTOMY     CESAREAN SECTION     CRYOTHERAPY  2013   to neck   DERMOID CYST  EXCISION     ECTOPIC PREGNANCY SURGERY     EXCISION MASS NECK Bilateral 10/20/2015   Procedure: EXCISION OF LARGE KELOIDS SEVERE RIGHT AND LEFT NECK WITH PLASTIC RECONSTRUCTION;  Surgeon: Louisa Second, MD;  Location: [facility];  Service: Plastics;  Laterality: Bilateral;   HAND SURGERY     amputation of right index finger   Patient Active Problem List   Diagnosis Date Noted   Type 2 diabetes mellitus with hyperglycemia (HCC) 09/06/2022   Genetic testing 12/17/2020   Family history of breast cancer    Family history of colon cancer    Cervical spinal stenosis 11/19/2019   Gait disturbance 05/21/2019   Transverse myelitis (HCC) 03/13/2019   Numbness 02/13/2019   Right leg weakness 02/13/2019   Lower extremity weakness 01/16/2019   Other incomplete lesion at T10 level of thoracic spinal  cord, initial encounter (HCC) 01/16/2019   Pneumonia due to COVID-19 virus 01/16/2019   Notalgia 08/10/2017   Morbid obesity (HCC) 02/18/2017   OSA (obstructive sleep apnea) 01/24/2017   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   Snoring 10/22/2016   Nocturnal dyspnea/choking   10/22/2016   Keloid of skin 09/25/2015   Essential hypertension, benign 09/27/2014   Teratoma 08/07/2014   Severe hypertension 06/08/2013   Dyslipidemia 07/23/2009   SHORTNESS OF BREATH 02/11/2009   HYPERTENSION 05/07/2008   KELOID SCAR 05/07/2008   HEADACHE 05/22/2007    REFERRING DIAG:  M25.512,G89.29 (ICD-10-CM) - Chronic left shoulder pain  M75.52 (ICD-10-CM) - Subacromial bursitis of left shoulder joint    THERAPY DIAG:  Chronic left shoulder pain  Muscle weakness (generalized)  Localized edema  Rationale for Evaluation and Treatment Rehabilitation  PERTINENT HISTORY: Radiation anterior neck 2017 Transverse myelitis  Hypertension Diabetes   PRECAUTIONS: fall   SUBJECTIVE:  SUBJECTIVE STATEMENT:  "A little bit better than last week. It hurt all weekend after last time. It's a little looser."    PAIN:  Are you having pain? Yes: NPRS scale: 3/10 Pain location: Lt superolateral shoulder Pain description: discomfort Aggravating factors: worse at night and with sleep; movement Relieving factors: heat, massage   OBJECTIVE: (objective measures completed at initial evaluation unless otherwise dated)  DIAGNOSTIC FINDINGS:  None    PATIENT SURVEYS :  FOTO 44% function t 61% predicted    COGNITION: Overall cognitive status: Within functional limits for tasks assessed                                     SENSATION: Not tested   POSTURE: Forward head/rounded shoulders    UPPER EXTREMITY ROM:     Active ROM Right eval Left eval 12/10/22 Left  12/17/22 Left   Shoulder flexion   70 AROM; 120 PROM empty  Unable active due to pain; 138 PROM, empty 70 AROM in standing   Shoulder extension        Shoulder abduction   50; 90 PROM empty 60 AROM; 90 PROM, empty   Shoulder adduction        Shoulder internal rotation   WNL AROM WNL AROM   Shoulder external rotation   60 AROM; 66 PROM 65 AROM; 72 PROM empty   Elbow flexion        Elbow extension        Wrist flexion        Wrist extension        Wrist ulnar deviation        Wrist radial deviation        Wrist pronation        Wrist supination        (Blank rows = not tested) pain with all AROM    UPPER EXTREMITY MMT:   MMT Right eval Left eval  Shoulder flexion   3- pn  Shoulder extension      Shoulder abduction   3- pn  Shoulder scaption   3- pn   Shoulder internal rotation 5 4+ pn  Shoulder external rotation 5 4- pn   Middle trapezius      Lower trapezius      Elbow flexion   5  Elbow extension      Wrist flexion      Wrist extension      Wrist ulnar deviation      Wrist radial deviation      Wrist pronation      Wrist supination      Grip strength (lbs)      (Blank rows = not tested)   SHOULDER SPECIAL TESTS: Not assessed due to symptom irritability with ROM and strength testing.    JOINT MOBILITY TESTING:  Not assessed due to symptoms irritability with ROM and strength testing    PALPATION:  Diffuse tenderness about Lt posterior rotator cuff [facility] Adult PT Treatment:                                                DATE: 12/17/22 Therapeutic Exercise: Pulleys flexion and scaption 2 min each  Supine AAROM flexion and press with dowel unable due to weakness/pain Supine AAROM ER with dowel  x 10  Resisted rows red band 2 x 10  Standing towel slides attempted d/c due to pain Shoulder flexion/extension reactive isometrics red band 2 x 10  Seated bicep curls red band 2 x 10  Updated HEP     [facility] Adult PT  Treatment:                                                DATE: 12/10/22 Therapeutic Exercise: Scapular retraction 2 x 10  Seated flexion towel slides x 10 Seated aduction towel slides x 10  Reactive ER/IR isometic red band 2 x 10 Updated HEP  Manual Therapy: Lt shoulder PROM to tolerance in all planes      [facility] Adult PT Treatment:                                                DATE: 11/15/22 Therapeutic Exercise: Demonstrated and issue initial HEP.    Therapeutic Activity: Education on assessment findings that will be addressed throughout duration of POC.    Self Care: Modalities for pain control      PATIENT EDUCATION: Education details: see treatment  Person educated: Patient Education method: Explanation, Demonstration, Tactile cues, Verbal cues, and Handouts Education comprehension: verbalized understanding, returned demonstration, verbal cues required, tactile cues required, and needs further education   HOME EXERCISE PROGRAM: Access Code: [email or web address] URL: [email or web address] Date: 11/15/2022 Prepared by: Letitia Libra   Exercises - Standing Isometric Shoulder External Rotation with Doorway  - 2 x daily - 7 x weekly - 1 sets - 10 reps - 5 sec  hold - Standing Isometric Shoulder Internal Rotation at Doorway  - 2 x daily - 7 x weekly - 1 sets - 10 reps - 5 sec  hold - Seated Scapular Retraction  - 2 x daily - 7 x weekly - 2 sets - 10 reps - Seated Shoulder Flexion Towel Slide at Table Top  - 2 x daily - 7 x weekly - 1 sets - 10 reps - 5 sec  hold   ASSESSMENT:   CLINICAL IMPRESSION: Patient arrives with mild discomfort in the Lt shoulder. Focused on ROM and isometric strengthening with fair tolerance as she is unable to perform basic progression of AAROM exercises due to pain/weakness. Able to complete gentle reactive isometrics of the Rt shoulder with reports of discomfort, but no pain. Will continue to progress as able in PT, though if her pain continues  to remain elevated with inability to tolerate gentle ROM and strengthening of the shoulder over the next few visits will plan to f/u with referring provider for further assessment with patient in agreement with this plan.      OBJECTIVE IMPAIRMENTS: decreased activity tolerance, decreased knowledge of condition, decreased ROM, decreased strength, impaired UE functional use, postural dysfunction, and pain.    ACTIVITY LIMITATIONS: carrying, lifting, sleeping, reach over head, and hygiene/grooming   PARTICIPATION LIMITATIONS: meal prep, cleaning, laundry, shopping, community activity, occupation, and yard work   PERSONAL FACTORS: Age, Fitness, Profession, Time since onset of injury/illness/exacerbation, and 3+ comorbidities: see PMH above  are also affecting patient's functional outcome.    REHAB POTENTIAL: Good   CLINICAL DECISION MAKING: Stable/uncomplicated   EVALUATION COMPLEXITY: Low   GOALS:  Goals reviewed with patient? Yes   SHORT TERM GOALS: Target date: 12/06/2022       Patient will be independent and compliant with initial HEP.    Baseline: issued at eval  Goal status: met   2.  Patient will demonstrate at least 100 degrees of Lt shoulder flexion AROM to improve ability to reach into cabinet at shoulder height.  Baseline: see above  Goal status: not met    3.  Patient will report pain at worst rated as </= 5/10 to improve her sleep quality.  Baseline: see above  12/17/22: 8/10  Goal status: not met     LONG TERM GOALS: Target date: 01/01/2023       Patient will score at least 61% on FOTO to signify clinically meaningful improvement in functional abilities.    Baseline: see above  Goal status: INITIAL   2.  Patient will demonstrate at least 4/5 Lt shoulder strength to improve ability to lift and carry objects Baseline: see above  Goal status: INITIAL   3.  Patient will demonstrate at least 150 degrees of Lt shoulder flexion/abduction AROM to improve ability to  reach into overhead cabinets.  Baseline: see above  Goal status: INITIAL   4.  Patient will demonstrate at least 70 degrees of Lt shoulder ER AROM to improve ability to complete self-care activities.  Baseline: see above  Goal status: INITIAL   5.  Patient will be independent with advanced home program to progress/maintain current level of function.  Baseline: initial HEP issued  Goal status: INITIAL   PLAN: PT FREQUENCY: 1-2x/week   PT DURATION: 6 weeks   PLANNED INTERVENTIONS: Therapeutic exercises, Therapeutic activity, Neuromuscular re-education, Patient/Family education, Self Care, Joint mobilization, Aquatic Therapy, Dry Needling, Electrical stimulation, Cryotherapy, Moist heat, Vasopneumatic device, Ionotophoresis 4mg /ml Dexamethasone, Manual therapy, and Re-evaluation   PLAN FOR NEXT SESSION: review and progress HEP prn; progress Lt shoulder ROM and strengthening as tolerated;  Letitia Libra, PT, DPT, ATC 12/17/22 10:59 AM

## 2022-12-17 ENCOUNTER — Ambulatory Visit: Payer: BC Managed Care – PPO | Attending: Internal Medicine

## 2022-12-17 DIAGNOSIS — R600 Localized edema: Secondary | ICD-10-CM | POA: Insufficient documentation

## 2022-12-17 DIAGNOSIS — G8929 Other chronic pain: Secondary | ICD-10-CM | POA: Insufficient documentation

## 2022-12-17 DIAGNOSIS — M6281 Muscle weakness (generalized): Secondary | ICD-10-CM | POA: Insufficient documentation

## 2022-12-17 DIAGNOSIS — M25512 Pain in left shoulder: Secondary | ICD-10-CM | POA: Diagnosis not present

## 2022-12-17 DIAGNOSIS — M7552 Bursitis of left shoulder: Secondary | ICD-10-CM | POA: Insufficient documentation

## 2022-12-23 NOTE — Progress Notes (Signed)
[name] D.[facility] [address] [place] [address] Phone: [phone]   Assessment and Plan:     1. Chronic left shoulder pain -Chronic with exacerbation, subsequent visit - Patient continues to have daily pain especially at night but overall is not improving despite CSI, NSAID course, HEP, physical therapy - Patient has failed conservative therapy for >6 weeks, continues to have day-to-day pain often >6/10, and pain affecting day-to-day activities.  At this time recommend further evaluation with left shoulder MRI.  Patient has a history of keloids, so prefers to not have contrast injection. - Continue HEP and physical therapy - May start Flexeril 5 to 10 mg nightly as needed for muscle spasms - Continue Tylenol 650 mg 2-3 times a day for day-to-day pain relief - MR SHOULDER LEFT WO CONTRAST; Future    Pertinent previous records reviewed include none   Follow Up: 3 days after MRI to review results and discuss treatment plan   Subjective:   I, [name], am serving as a Neurosurgeon for Doctor Richardean Sale   Chief Complaint: left shoulder pain    HPI:    09/20/2022 Patient is a 52 year old female complaining of left shoulder pain . Patient states that she has decreased ROM, heat helps, pain all the time, been going on since christmas , is an avid gym goer, no numbness or tingling, intermittent tylenol, massage helps, ist able to lift things with her arm,    10/18/2022 Patient states she struggles in the am and pm , has increased ROM but still hurts , meloxicam doesn't make a difference she can feel    11/15/2022 Patient states that she is the same , PT thinks its rotator cuff    12/24/2022 Patient states she has had pain since last week due to PT     Relevant Historical Information: DM type II, hypertension, history of keloids  Additional pertinent review of systems negative.   Current Outpatient Medications:    baclofen  (LIORESAL) 10 MG tablet, Take 1 tablet (10 mg total) by mouth daily as needed for muscle spasms., Disp: 90 each, Rfl: 4   cyclobenzaprine (FLEXERIL) 5 MG tablet, Take one po qd prn, Disp: 90 tablet, Rfl: 4   fluticasone (FLONASE) 50 MCG/ACT nasal spray, Place 2 sprays into both nostrils daily., Disp: 16 mL, Rfl: 0   Insulin Glargine (BASAGLAR KWIKPEN) 100 UNIT/ML, INJECT 15 UNITS UNDER THE SKIN DAILY, WITH PEN NEEDLES OR AS DIRECTED, Disp: 15 mL, Rfl: 2   meloxicam (MOBIC) 15 MG tablet, Take 1 tablet (15 mg total) by mouth daily., Disp: 30 tablet, Rfl: 0   metFORMIN (GLUCOPHAGE) 500 MG tablet, TAKE 4 TABLETS BY MOUTH EVERY DAY Needs appointment for refills., Disp: 120 tablet, Rfl: 0   Olmesartan-amLODIPine-HCTZ 40-5-25 MG TABS, Take 1 tablet by mouth daily., Disp: 90 tablet, Rfl: 3   rosuvastatin (CRESTOR) 10 MG tablet, Take 1 tablet (10 mg total) by mouth daily., Disp: 90 tablet, Rfl: 3   vitamin C (VITAMIN C) 500 MG tablet, Take 1 tablet (500 mg total) by mouth daily. Please take for 2 weeks, Disp: , Rfl:    zinc sulfate 220 (50 Zn) MG capsule, Take 1 capsule (220 mg total) by mouth daily. Please take for 2 weeks, Disp: , Rfl:    Objective:     Vitals:   12/24/22 1120  BP: 130/84  Pulse: 71  SpO2: 97%  Weight: 242 lb (109.8 kg)  Height: 6\' 1"  (  1.854 m)      Body mass index is 31.93 kg/m.    Physical Exam:    Gen: Appears well, nad, nontoxic and pleasant Neuro:sensation intact, strength is 5/5 with df/pf/inv/ev, muscle tone wnl Skin: no suspicious lesion or defmority Psych: A&O, appropriate mood and affect   Left shoulder:  No deformity, swelling or muscle wasting No scapular winging FF 160, abd 140, int 10, ext 80 TTP mildly globally including over the [place], clavicle, ac, coracoid, biceps groove, humerus, deltoid, NTTP trapezius, cervical spine Positive empty can  negative Spurling's test bilat FROM of neck     Electronically signed by:  [name] D.[facility] 11:57 AM 12/24/22

## 2022-12-24 ENCOUNTER — Ambulatory Visit: Payer: BC Managed Care – PPO | Admitting: Sports Medicine

## 2022-12-24 ENCOUNTER — Ambulatory Visit: Payer: BC Managed Care – PPO

## 2022-12-24 VITALS — BP 130/84 | HR 71 | Ht 73.0 in | Wt 242.0 lb

## 2022-12-24 DIAGNOSIS — G8929 Other chronic pain: Secondary | ICD-10-CM

## 2022-12-24 DIAGNOSIS — M6281 Muscle weakness (generalized): Secondary | ICD-10-CM

## 2022-12-24 DIAGNOSIS — R600 Localized edema: Secondary | ICD-10-CM

## 2022-12-24 DIAGNOSIS — M25512 Pain in left shoulder: Secondary | ICD-10-CM | POA: Diagnosis not present

## 2022-12-24 DIAGNOSIS — M7552 Bursitis of left shoulder: Secondary | ICD-10-CM | POA: Diagnosis not present

## 2022-12-24 NOTE — Therapy (Signed)
OUTPATIENT PHYSICAL THERAPY TREATMENT NOTE  PHYSICAL THERAPY DISCHARGE SUMMARY  Visits from Start of Care: 4  Current functional level related to goals / functional outcomes: See goals below   Remaining deficits: Lt shoulder pain Lt shoulder weakness and ROM deficits    Education / Equipment: See education below    Patient agrees to discharge. Patient goals were partially met. Patient is being discharged due to did not respond to therapy.   Patient Name: [name] MRN: [id number] DOB:01-13-1971, 52 y.o., female Today's Date: 12/24/2022  PCP: Madelin Headings, MD REFERRING PROVIDER: Richardean Sale, DO  END OF SESSION:   PT End of Session - 12/24/22 1014     Visit Number 4    Number of Visits 13    Date for PT Re-Evaluation 01/01/23    Authorization Type BCBS    PT Start Time 1014    PT Stop Time 1054    PT Time Calculation (min) 40 min    Activity Tolerance Patient limited by pain    Behavior During Therapy [facility] for tasks assessed/performed               Past Medical History:  Diagnosis Date   Diabetes mellitus without complication (HCC)    Family history of breast cancer    Family history of colon cancer    Hypertension    Hypoglycemia    Radiation 10/21/2015-10/23/2015   Anterior neck area 12 gray in 3 fractions   Sleep apnea    Teratoma    Transverse myelitis (HCC)    Past Surgical History:  Procedure Laterality Date   ABDOMINAL HERNIA REPAIR     ABDOMINAL HYSTERECTOMY     CESAREAN SECTION     CRYOTHERAPY  2013   to neck   DERMOID CYST  EXCISION     ECTOPIC PREGNANCY SURGERY     EXCISION MASS NECK Bilateral 10/20/2015   Procedure: EXCISION OF LARGE KELOIDS SEVERE RIGHT AND LEFT NECK WITH PLASTIC RECONSTRUCTION;  Surgeon: Louisa Second, MD;  Location: [facility];  Service: Plastics;  Laterality: Bilateral;   HAND SURGERY     amputation of right index finger   Patient Active Problem List   Diagnosis Date Noted   Type 2  diabetes mellitus with hyperglycemia (HCC) 09/06/2022   Genetic testing 12/17/2020   Family history of breast cancer    Family history of colon cancer    Cervical spinal stenosis 11/19/2019   Gait disturbance 05/21/2019   Transverse myelitis (HCC) 03/13/2019   Numbness 02/13/2019   Right leg weakness 02/13/2019   Lower extremity weakness 01/16/2019   Other incomplete lesion at T10 level of thoracic spinal cord, initial encounter (HCC) 01/16/2019   Pneumonia due to COVID-19 virus 01/16/2019   Notalgia 08/10/2017   Morbid obesity (HCC) 02/18/2017   OSA (obstructive sleep apnea) 01/24/2017   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   Snoring 10/22/2016   Nocturnal dyspnea/choking   10/22/2016   Keloid of skin 09/25/2015   Essential hypertension, benign 09/27/2014   Teratoma 08/07/2014   Severe hypertension 06/08/2013   Dyslipidemia 07/23/2009   SHORTNESS OF BREATH 02/11/2009   HYPERTENSION 05/07/2008   KELOID SCAR 05/07/2008   HEADACHE 05/22/2007    REFERRING DIAG:  M25.512,G89.29 (ICD-10-CM) - Chronic left shoulder pain  M75.52 (ICD-10-CM) - Subacromial bursitis of left shoulder joint    THERAPY DIAG:  Chronic left shoulder pain  Muscle weakness (generalized)  Localized edema  Rationale for Evaluation and Treatment Rehabilitation  PERTINENT HISTORY:  Radiation anterior neck 2017 Transverse myelitis  Hypertension Diabetes   PRECAUTIONS: fall   SUBJECTIVE:                                                                                                                                                                                      SUBJECTIVE STATEMENT:  "It's been aggravated this week. I'm not sure if it's getting any better."   PAIN:  Are you having pain? Yes: NPRS scale: 6/10; at worst 8/10  Pain location: Lt superolateral shoulder Pain description: discomfort Aggravating factors: worse at night and with sleep; movement Relieving factors: heat,  massage   OBJECTIVE: (objective measures completed at initial evaluation unless otherwise dated)  DIAGNOSTIC FINDINGS:  None    PATIENT SURVEYS :  FOTO 44% function t 61% predicted  12/24/22: 45%    COGNITION: Overall cognitive status: Within functional limits for tasks assessed                                     SENSATION: Not tested   POSTURE: Forward head/rounded shoulders    UPPER EXTREMITY ROM:    Active ROM Right eval Left eval 12/10/22 Left  12/17/22 Left  12/24/22 Left   Shoulder flexion   70 AROM; 120 PROM empty  Unable active due to pain; 138 PROM, empty 70 AROM in standing  40 AROM  Shoulder extension         Shoulder abduction   50; 90 PROM empty 60 AROM; 90 PROM, empty  20 AROM  Shoulder adduction         Shoulder internal rotation   WNL AROM WNL AROM  75 AROM  Shoulder external rotation   60 AROM; 66 PROM 65 AROM; 72 PROM empty  50 AROM  Elbow flexion         Elbow extension         Wrist flexion         Wrist extension         Wrist ulnar deviation         Wrist radial deviation         Wrist pronation         Wrist supination         (Blank rows = not tested) pain with all AROM    UPPER EXTREMITY MMT:   MMT Right eval Left eval 12/24/22 Left   Shoulder flexion   3- pn 3- pn  Shoulder extension       Shoulder abduction   3- pn 3- pn  Shoulder scaption   3-  pn    Shoulder internal rotation 5 4+ pn 4+ pn  Shoulder external rotation 5 4- pn  4- pn  Middle trapezius       Lower trapezius       Elbow flexion   5   Elbow extension       Wrist flexion       Wrist extension       Wrist ulnar deviation       Wrist radial deviation       Wrist pronation       Wrist supination       Grip strength (lbs)       (Blank rows = not tested)   SHOULDER SPECIAL TESTS: Not assessed due to symptom irritability with ROM and strength testing.    JOINT MOBILITY TESTING:  Not assessed due to symptoms irritability with ROM and strength testing    PALPATION:   Diffuse tenderness about Lt posterior rotator cuff [facility] Adult PT Treatment:                                                DATE: 12/24/22 Therapeutic Exercise: Shoulder flexion towel slides x 10  Shoulder abduction towel slides x 10  Upper trap stretch x 30 sec Levator scap stretch x 30 sec ER AAROM with dowel attempted d/c due to pain Resisted shoulder extension yellow attempted d/c due to pain Rows 2 x 10 yellow band Scapular retraction x10  Updated HEP   Therapeutic Activity: Re-assessment to determine overall progress, educating patient on progress towards goals.     [facility] Adult PT Treatment:                                                DATE: 12/17/22 Therapeutic Exercise: Pulleys flexion and scaption 2 min each  Supine AAROM flexion and press with dowel unable due to weakness/pain Supine AAROM ER with dowel x 10  Resisted rows red band 2 x 10  Standing towel slides attempted d/c due to pain Shoulder flexion/extension reactive isometrics red band 2 x 10  Seated bicep curls red band 2 x 10  Updated HEP     [facility] Adult PT Treatment:                                                DATE: 12/10/22 Therapeutic Exercise: Scapular retraction 2 x 10  Seated flexion towel slides x 10 Seated aduction towel slides x 10  Reactive ER/IR isometic red band 2 x 10 Updated HEP  Manual Therapy: Lt shoulder PROM to tolerance in all planes        PATIENT EDUCATION: Education details: see treatment  Person educated: Patient Education method: Explanation, Demonstration, Tactile cues, Verbal cues, and Handouts Education comprehension: verbalized understanding, returned demonstration   HOME EXERCISE PROGRAM: Access Code: [email or web address] URL: [email or web address]    ASSESSMENT:   CLINICAL IMPRESSION: Patient has attended 4 PT sessions since the start of care with minimal improvement in her Lt shoulder pain. Her Lt shoulder AROM has regressed since the start of care with  significant limitations in flexion,  abduction and ER. She continues to have global weakness about the Lt shoulder with pain elicited with all MMT. Due to ongoing pain and regression/lack of progress in objective measurements she is appropriate for discharge with recommendation to f/u with referring provider for further assessment with patient in agreement with plan.      OBJECTIVE IMPAIRMENTS: decreased activity tolerance, decreased knowledge of condition, decreased ROM, decreased strength, impaired UE functional use, postural dysfunction, and pain.    ACTIVITY LIMITATIONS: carrying, lifting, sleeping, reach over head, and hygiene/grooming   PARTICIPATION LIMITATIONS: meal prep, cleaning, laundry, shopping, community activity, occupation, and yard work   PERSONAL FACTORS: Age, Fitness, Profession, Time since onset of injury/illness/exacerbation, and 3+ comorbidities: see PMH above  are also affecting patient's functional outcome.    REHAB POTENTIAL: Good   CLINICAL DECISION MAKING: Stable/uncomplicated   EVALUATION COMPLEXITY: Low   GOALS: Goals reviewed with patient? Yes   SHORT TERM GOALS: Target date: 12/06/2022       Patient will be independent and compliant with initial HEP.    Baseline: issued at eval  Goal status: met   2.  Patient will demonstrate at least 100 degrees of Lt shoulder flexion AROM to improve ability to reach into cabinet at shoulder height.  Baseline: see above  Goal status: not met    3.  Patient will report pain at worst rated as </= 5/10 to improve her sleep quality.  Baseline: see above  12/17/22: 8/10  Goal status: not met     LONG TERM GOALS: Target date: 01/01/2023       Patient will score at least 61% on FOTO to signify clinically meaningful improvement in functional abilities.    Baseline: see above  Goal status: not met   2.  Patient will demonstrate at least 4/5 Lt shoulder strength to improve ability to lift and carry objects Baseline:  see above  Goal status: not met   3.  Patient will demonstrate at least 150 degrees of Lt shoulder flexion/abduction AROM to improve ability to reach into overhead cabinets.  Baseline: see above  Goal status: not met   4.  Patient will demonstrate at least 70 degrees of Lt shoulder ER AROM to improve ability to complete self-care activities.  Baseline: see above  Goal status: not met   5.  Patient will be independent with advanced home program to progress/maintain current level of function.  Baseline: initial HEP issued  Goal status: met   PLAN: PT FREQUENCY: n/a   PT DURATION: n/a   PLANNED INTERVENTIONS: Therapeutic exercises, Therapeutic activity, Neuromuscular re-education, Patient/Family education, Self Care, Joint mobilization, Aquatic Therapy, Dry Needling, Electrical stimulation, Cryotherapy, Moist heat, Vasopneumatic device, Ionotophoresis 4mg /ml Dexamethasone, Manual therapy, and Re-evaluation   PLAN FOR NEXT SESSION: n/a  Letitia Libra, PT, DPT, ATC 12/24/22 10:58 AM

## 2022-12-24 NOTE — Patient Instructions (Addendum)
Left shoulder MRI Flexeril 5-10 mg nightly as needed for muscle spasm  Follow up 3 days after MRI to discuss results

## 2022-12-29 ENCOUNTER — Telehealth: Payer: Self-pay

## 2022-12-29 ENCOUNTER — Other Ambulatory Visit: Payer: Self-pay | Admitting: Sports Medicine

## 2022-12-29 DIAGNOSIS — G8929 Other chronic pain: Secondary | ICD-10-CM

## 2022-12-29 MED ORDER — MELOXICAM 15 MG PO TABS: 15.0000 mg | ORAL_TABLET | Freq: Every day | ORAL | 0 refills | Status: AC | PRN

## 2022-12-29 MED ORDER — CYCLOBENZAPRINE HCL 5 MG PO TABS: ORAL_TABLET | ORAL | 0 refills | Status: AC

## 2022-12-29 NOTE — Telephone Encounter (Signed)
Patient states that she was supposed to get a refill of meloxicam and a rx for the flexiril sent to her pharmacy but it was not sent in. Can this be sent to [facility] on [address] please

## 2022-12-31 ENCOUNTER — Ambulatory Visit: Payer: BC Managed Care – PPO | Admitting: Sports Medicine

## 2023-02-16 ENCOUNTER — Ambulatory Visit
Admission: RE | Admit: 2023-02-16 | Discharge: 2023-02-16 | Disposition: A | Discharge status: Home / Self Care | Payer: BC Managed Care – PPO | Source: Ambulatory Visit | Attending: Sports Medicine | Admitting: Sports Medicine

## 2023-02-16 DIAGNOSIS — M7581 Other shoulder lesions, right shoulder: Secondary | ICD-10-CM | POA: Diagnosis not present

## 2023-02-16 DIAGNOSIS — M25512 Pain in left shoulder: Secondary | ICD-10-CM | POA: Diagnosis not present

## 2023-02-16 DIAGNOSIS — M7551 Bursitis of right shoulder: Secondary | ICD-10-CM | POA: Diagnosis not present

## 2023-02-16 DIAGNOSIS — G8929 Other chronic pain: Secondary | ICD-10-CM

## 2023-02-28 ENCOUNTER — Ambulatory Visit: Payer: BC Managed Care – PPO | Admitting: Sports Medicine

## 2023-02-28 VITALS — BP 130/84 | HR 104 | Ht 73.0 in | Wt 240.0 lb

## 2023-02-28 DIAGNOSIS — M25512 Pain in left shoulder: Secondary | ICD-10-CM | POA: Diagnosis not present

## 2023-02-28 DIAGNOSIS — M7552 Bursitis of left shoulder: Secondary | ICD-10-CM | POA: Diagnosis not present

## 2023-02-28 DIAGNOSIS — G8929 Other chronic pain: Secondary | ICD-10-CM

## 2023-02-28 MED ORDER — CELECOXIB 200 MG PO CAPS: 200.0000 mg | ORAL_CAPSULE | Freq: Two times a day (BID) | ORAL | 0 refills | Status: AC

## 2023-02-28 NOTE — Patient Instructions (Addendum)
Good to see you  - Start Celebrex 200 mg daily x2 a day .  If still having pain after 2 weeks, complete 3rd-week of Celebrex. May use remaining Celebrex as needed once daily for pain control.  Do not to use additional NSAIDs while taking Celebrex.  May use Tylenol [phone] mg 2 to 3 times a day for breakthrough pain. PT referral  Restart HEP  4-5 week follow up

## 2023-02-28 NOTE — Progress Notes (Signed)
[name] [address] [place] [address] Phone: [phone]   Assessment and Plan:     1. Chronic left shoulder pain 2. Subacromial bursitis of left shoulder joint  -Chronic with exacerbation, subsequent visit - Reviewed patient's MRI which showed moderate tendinosis of supraspinatus tendon and mild tendinosis of the infraspinatus and long head of biceps, and subacromial bursitis - Patient has not had significant benefit with conservative therapy which has included course of meloxicam, 1 subacromial CSI in 10/2022, HEP, physical therapy - Patient is agreeable to repeating conservative course to include alternative NSAID treatment, restarting HEP and physical therapy including aquatic therapy which patient has benefited from in the past - Start Celebrex 200 mg twice daily x2 weeks.  If still having pain after 2 weeks, complete 3rd-week of Celebrex. May use remaining Celebrex as needed once daily for pain control.  Do not to use additional NSAIDs while taking meloxicam.  May use Tylenol [phone] mg 2 to 3 times a day for breakthrough pain. -New PT referral sent  Pertinent previous records reviewed include MRI left shoulder 02/16/2023   Follow Up: 4 to 5 weeks for reevaluation.  If no improvement or worsening of symptoms, could consider repeat subacromial CSI versus PRP injection versus ECSWT   Subjective:   I, [name], am serving as a Neurosurgeon for [name]   Chief Complaint: left shoulder pain    HPI:    09/20/2022 Patient is a 52 year old female complaining of left shoulder pain . Patient states that she has decreased ROM, heat helps, pain all the time, been going on since christmas , is an avid gym goer, no numbness or tingling, intermittent tylenol, massage helps, ist able to lift things with her arm,    10/18/2022 Patient states she struggles in the am and pm , has increased ROM but still hurts , meloxicam  doesn't make a difference she can feel    11/15/2022 Patient states that she is the same , PT thinks its rotator cuff    12/24/2022 Patient states she has had pain since last week due to PT   02/28/2023 Patient states meloxicam doesn't work to well , wants to know if cryotherapy will help with some of the inflammation. The pain is the same , and states the swelling is  radiating down to her hand   Relevant Historical Information: DM type II, hypertension, history of keloids  Additional pertinent review of systems negative.   Current Outpatient Medications:    baclofen (LIORESAL) 10 MG tablet, Take 1 tablet (10 mg total) by mouth daily as needed for muscle spasms., Disp: 90 each, Rfl: 4   celecoxib (CELEBREX) 200 MG capsule, Take 1 capsule (200 mg total) by mouth 2 (two) times daily., Disp: 60 capsule, Rfl: 0   cyclobenzaprine (FLEXERIL) 5 MG tablet, Take one po qd prn, Disp: 30 tablet, Rfl: 0   fluticasone (FLONASE) 50 MCG/ACT nasal spray, Place 2 sprays into both nostrils daily., Disp: 16 mL, Rfl: 0   Insulin Glargine (BASAGLAR KWIKPEN) 100 UNIT/ML, INJECT 15 UNITS UNDER THE SKIN DAILY, WITH PEN NEEDLES OR AS DIRECTED, Disp: 15 mL, Rfl: 2   meloxicam (MOBIC) 15 MG tablet, Take 1 tablet (15 mg total) by mouth daily as needed for pain., Disp: 30 tablet, Rfl: 0   metFORMIN (GLUCOPHAGE) 500 MG tablet, TAKE 4 TABLETS BY MOUTH EVERY DAY Needs appointment for refills., Disp: 120 tablet, Rfl: 0   Olmesartan-amLODIPine-HCTZ 40-5-25  MG TABS, Take 1 tablet by mouth daily., Disp: 90 tablet, Rfl: 3   rosuvastatin (CRESTOR) 10 MG tablet, Take 1 tablet (10 mg total) by mouth daily., Disp: 90 tablet, Rfl: 3   vitamin C (VITAMIN C) 500 MG tablet, Take 1 tablet (500 mg total) by mouth daily. Please take for 2 weeks, Disp: , Rfl:    zinc sulfate 220 (50 Zn) MG capsule, Take 1 capsule (220 mg total) by mouth daily. Please take for 2 weeks, Disp: , Rfl:    Objective:     Vitals:   02/28/23 1256  BP: 130/84   Pulse: (!) 104  SpO2: 98%  Weight: 240 lb (108.9 kg)  Height: 6\' 1"  (1.854 m)      Body mass index is 31.66 kg/m.    Physical Exam:    Gen: Appears well, nad, nontoxic and pleasant Neuro:sensation intact, strength is 5/5 with df/pf/inv/ev, muscle tone wnl Skin: no suspicious lesion or defmority Psych: A&O, appropriate mood and affect   Left shoulder:  No deformity, swelling or muscle wasting No scapular winging FF 160, abd 140, int 10, ext 80 TTP mildly globally including over the [place], clavicle, ac, coracoid, biceps groove, humerus, deltoid, NTTP trapezius, cervical spine Positive empty can  negative Spurling's test bilat FROM of neck     Electronically signed by:  [name] 1:21 PM 02/28/23

## 2023-03-21 ENCOUNTER — Other Ambulatory Visit: Payer: Self-pay

## 2023-03-21 ENCOUNTER — Ambulatory Visit: Payer: BC Managed Care – PPO

## 2023-03-21 ENCOUNTER — Ambulatory Visit: Payer: BC Managed Care – PPO | Attending: Sports Medicine | Admitting: Occupational Therapy

## 2023-03-21 DIAGNOSIS — M6281 Muscle weakness (generalized): Secondary | ICD-10-CM | POA: Diagnosis not present

## 2023-03-21 DIAGNOSIS — M25512 Pain in left shoulder: Secondary | ICD-10-CM | POA: Diagnosis not present

## 2023-03-21 DIAGNOSIS — G8929 Other chronic pain: Secondary | ICD-10-CM | POA: Diagnosis not present

## 2023-03-21 DIAGNOSIS — R600 Localized edema: Secondary | ICD-10-CM | POA: Diagnosis not present

## 2023-03-21 NOTE — Therapy (Signed)
OUTPATIENT OCCUPATIONAL THERAPY ORTHO EVALUATION  Patient Name: [name] MRN: [id number] DOB:02-21-1971, 52 y.o., female Today's Date: 03/21/2023  PCP: Madelin Headings, MD REFERRING PROVIDER: Richardean Sale, DO  END OF SESSION:  OT End of Session - 03/21/23 1317     Visit Number 1    Number of Visits 13    Date for OT Re-Evaluation 05/20/23    Authorization Type BCBS    OT Start Time 1155    OT Stop Time 1235    OT Time Calculation (min) 40 min    Behavior During Therapy WFL for tasks assessed/performed             Past Medical History:  Diagnosis Date   Diabetes mellitus without complication (HCC)    Family history of breast cancer    Family history of colon cancer    Hypertension    Hypoglycemia    Radiation 10/21/2015-10/23/2015   Anterior neck area 12 gray in 3 fractions   Sleep apnea    Teratoma    Transverse myelitis (HCC)    Past Surgical History:  Procedure Laterality Date   ABDOMINAL HERNIA REPAIR     ABDOMINAL HYSTERECTOMY     CESAREAN SECTION     CRYOTHERAPY  2013   to neck   DERMOID CYST  EXCISION     ECTOPIC PREGNANCY SURGERY     EXCISION MASS NECK Bilateral 10/20/2015   Procedure: EXCISION OF LARGE KELOIDS SEVERE RIGHT AND LEFT NECK WITH PLASTIC RECONSTRUCTION;  Surgeon: Louisa Second, MD;  Location: [facility];  Service: Plastics;  Laterality: Bilateral;   HAND SURGERY     amputation of right index finger   Patient Active Problem List   Diagnosis Date Noted   Type 2 diabetes mellitus with hyperglycemia (HCC) 09/06/2022   Genetic testing 12/17/2020   Family history of breast cancer    Family history of colon cancer    Cervical spinal stenosis 11/19/2019   Gait disturbance 05/21/2019   Transverse myelitis (HCC) 03/13/2019   Numbness 02/13/2019   Right leg weakness 02/13/2019   Lower extremity weakness 01/16/2019   Other incomplete lesion at T10 level of thoracic spinal cord, initial encounter (HCC) 01/16/2019    Pneumonia due to COVID-19 virus 01/16/2019   Notalgia 08/10/2017   Morbid obesity (HCC) 02/18/2017   OSA (obstructive sleep apnea) 01/24/2017   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   Snoring 10/22/2016   Nocturnal dyspnea/choking   10/22/2016   Keloid of skin 09/25/2015   Essential hypertension, benign 09/27/2014   Teratoma 08/07/2014   Severe hypertension 06/08/2013   Dyslipidemia 07/23/2009   SHORTNESS OF BREATH 02/11/2009   HYPERTENSION 05/07/2008   KELOID SCAR 05/07/2008   HEADACHE 05/22/2007    ONSET DATE: referral 02/28/23  REFERRING DIAG: J62.831,D17.61 (ICD-10-CM) - Chronic left shoulder pain M75.52 (ICD-10-CM) - Subacromial bursitis of left shoulder joint  THERAPY DIAG:  Chronic left shoulder pain  Muscle weakness (generalized)  Localized edema  Rationale for Evaluation and Treatment: Rehabilitation  SUBJECTIVE:   SUBJECTIVE STATEMENT: "I can't raise my arm." Pt reports, "they gave me a steroid shot and I was able to do 3-4 sessions with PT but was unable to progress due to too much pain."  Pt reports increased pain starting back in December 2023 and thinks the only thing that could have changed was that she had started using the to battle ropes in the gym.  Pt reports making modifications to her daily routine to allow for continued engagement  in ADLs and work tasks.  Pt reports modifications to dressing routine, and even closing the car door with R arm and frequently will rest L arm in supination on thigh to aid in managing steering wheel.   Pt accompanied by: self  PERTINENT HISTORY: Radiation anterior neck 2017, Transverse myelitis, Hypertension, Diabetes   PRECAUTIONS: None  RED FLAGS: H/o transverse myelitis   WEIGHT BEARING RESTRICTIONS: No  PAIN:  Are you having pain? Yes: NPRS scale: 2-3/10 Pain location: L shoulder, upper back Pain description: discomfort Aggravating factors: increased reaching, force Relieving factors: limits movement,  very guarded  FALLS: Has patient fallen in last 6 months? Yes. 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  PATIENT GOALS: to be pain free and gain as much ROM as I can with my L arm  NEXT MD VISIT: none scheduled  OBJECTIVE:   HAND DOMINANCE: Right  ADLs: Overall ADLs: reports adjusting to "life as the way it is" Grooming: difficulty combing hair, frequently utilizes only R hand due to decreased ROM in LUE UB Dressing: has adapted, reports husband will assist when needed but that she has adapted LB Dressing: leans against the wall or the bed and then  Bathing: uses long handled brush to wash under L arm and wash back Equipment: Long handled sponge  FUNCTIONAL OUTCOME MEASURES: Quick Dash: 59.1% impairement  UPPER EXTREMITY ROM:     Active ROM Right eval Left eval  Shoulder flexion  42  Shoulder abduction  25  Shoulder adduction    Shoulder extension    Shoulder internal rotation  Able to elicit near full ROM  Shoulder external rotation  unable  Elbow flexion  100  Elbow extension  Full ROM  Wrist flexion    Wrist extension    Wrist ulnar deviation    Wrist radial deviation    Wrist pronation    Wrist supination    (Blank rows = not tested)   UPPER EXTREMITY MMT:     MMT Right eval Left eval  Shoulder flexion  2-  Shoulder abduction  2-  Shoulder adduction    Shoulder extension    Shoulder internal rotation    Shoulder external rotation    Middle trapezius    Lower trapezius    Elbow flexion    Elbow extension    Wrist flexion     Wrist extension    Wrist ulnar deviation    Wrist radial deviation    Wrist pronation    Wrist supination    (Blank rows = not tested)  HAND FUNCTION: Grip strength: Right: 78 lbs; Left: 5 lbs  COORDINATION: Unable to assess due to increased pain with attempts at ROM   SENSATION: Reports intermittent numbness, tingling in finger tips; notes increased dropping of items    EDEMA: mild edema in forearm, hand, and into middle finger  COGNITION: Overall cognitive status: Within functional limits for tasks assessed  OBSERVATIONS: Pt guarding all movements and reports increased onset of pain with internal/external rotation and attempts at shoulder flexion.   TODAY'S TREATMENT:  DATE:  03/21/23 Discussed aquatic therapy benefits, followed by in-clinic therapy as pt with increased ROM and decreased pain.    PATIENT EDUCATION: Education details: Educated on role and purpose of OT as well as potential interventions and goals for therapy based on initial evaluation findings. Person educated: Patient Education method: Explanation Education comprehension: verbalized understanding  HOME EXERCISE PROGRAM: TBD  GOALS: Goals reviewed with patient? Yes  SHORT TERM GOALS: Target date: 04/22/23  Pt will be independent and compliant with aquatic therapy HEP. Baseline: Goal status: INITIAL  2.  Pt will demonstrate improvements in L shoulder flexion to >/= to 80 degrees  AROM to improve participation in UB dressing tasks Baseline:  Goal status: INITIAL  3.  Pt will demonstrate at least 30 degrees of L shoulder ER AROM to improve ability to complete self-care activities.  Baseline:  Goal status: INITIAL  4.  Pt will report pain at worst rated as </= 5/10 to improve her sleep quality.  Baseline:  Goal status: INITIAL  LONG TERM GOALS: Target date:  05/20/23  Patient will demonstrate at least 4/5 L shoulder strength to improve ability to lift and carry objects  Baseline:  Goal status: INITIAL  2.  Pt will demonstrate improvements in functional use of L UE as evidenced by improved score on QuickDASH to < 45%. Baseline: 59.1% Goal status: INITIAL  3.  Pt will demonstrate improvements in L shoulder flexion to >/= to 120 degrees  AROM to improve participation in UB dressing tasks Baseline:  Goal status: INITIAL  4.  Pt will demonstrate at least 70 degrees of L shoulder ER AROM to improve ability to complete self-care activities.  Baseline:  Goal status: INITIAL   ASSESSMENT:  CLINICAL IMPRESSION: Patient is a 52 y.o. female who was seen today for occupational therapy evaluation for chronic L shoulder pain. Pt expressing desire to begin aquatic therapy as attempted therapy clinic in the spring of 2024 with increased pain and decreased ROM.  Pt demonstrates limited and painful shoulder flexion, abduction, and ER AROM and pain and weakness with all MMT most notable with ER, flexion, and abduction. Pt will benefit from skilled occupational therapy to address strength and coordination, ROM, pain management, GM/FM control, safety awareness, introduction of compensatory strategies/AE prn,  and implementation of an HEP to improve participation and safety during ADLs and IADLs.    PERFORMANCE DEFICITS: in functional skills including ADLs, IADLs, coordination, edema, ROM, strength, pain, Fine motor control, Gross motor control, mobility, body mechanics, endurance, and UE functional use.  IMPAIRMENTS: are limiting patient from ADLs, IADLs, rest and sleep, work, leisure, and social participation.   COMORBIDITIES: may have co-morbidities  that affects occupational performance. Patient will benefit from skilled OT to address above impairments and improve overall function.  MODIFICATION OR ASSISTANCE TO COMPLETE EVALUATION: Min-Moderate modification  of tasks or assist with assess necessary to complete an evaluation.  OT OCCUPATIONAL PROFILE AND HISTORY: Detailed assessment: Review of records and additional review of physical, cognitive, psychosocial history related to current functional performance.  CLINICAL DECISION MAKING: Moderate - several treatment options, min-mod task modification necessary  REHAB POTENTIAL: Good  EVALUATION COMPLEXITY: Moderate      PLAN:  OT FREQUENCY: 1-2x/week (start with 1x/week with aquatic and increase as pain/ROM improve)  OT DURATION: 8 weeks  PLANNED INTERVENTIONS: self care/ADL training, therapeutic exercise, therapeutic activity, neuromuscular re-education, manual therapy, passive range of motion, aquatic therapy, ultrasound, iontophoresis, compression bandaging, moist heat, cryotherapy, patient/family education, energy conservation, coping strategies training, DME  and/or AE instructions, and Dry needling  RECOMMENDED OTHER SERVICES: aquatic therapy  CONSULTED AND AGREED WITH PLAN OF CARE: Patient  PLAN FOR NEXT SESSION: initiate HEP in aquatic therapy, once pain and ROM improving pt may benefit from sessions in clinic to facilitate increased focus on coordination and further functional tasks.   , , OTR/L 03/21/2023, 1:19 PM

## 2023-04-04 ENCOUNTER — Encounter: Payer: Self-pay | Admitting: Occupational Therapy

## 2023-04-04 ENCOUNTER — Ambulatory Visit: Payer: BC Managed Care – PPO | Attending: Internal Medicine | Admitting: Occupational Therapy

## 2023-04-04 DIAGNOSIS — G8929 Other chronic pain: Secondary | ICD-10-CM | POA: Insufficient documentation

## 2023-04-04 DIAGNOSIS — M25512 Pain in left shoulder: Secondary | ICD-10-CM | POA: Insufficient documentation

## 2023-04-04 DIAGNOSIS — R600 Localized edema: Secondary | ICD-10-CM | POA: Diagnosis not present

## 2023-04-04 DIAGNOSIS — M6281 Muscle weakness (generalized): Secondary | ICD-10-CM | POA: Insufficient documentation

## 2023-04-04 NOTE — Therapy (Signed)
OUTPATIENT OCCUPATIONAL THERAPY ORTHO EVALUATION  Patient Name: [name] MRN: [id number] DOB:[birth year], 52 y.o., female Today's Date: 04/04/2023  PCP: Madelin Headings, MD REFERRING PROVIDER: Richardean Sale, DO  END OF SESSION:  OT End of Session - 04/04/23 1657     Visit Number 2    Number of Visits 13    Date for OT Re-Evaluation 05/20/23    Authorization Type BCBS    OT Start Time 1414    OT Stop Time 1500    OT Time Calculation (min) 46 min    Equipment Utilized During Treatment water dumbbells    Activity Tolerance Patient tolerated treatment well    Behavior During Therapy WFL for tasks assessed/performed             Past Medical History:  Diagnosis Date   Diabetes mellitus without complication (HCC)    Family history of breast cancer    Family history of colon cancer    Hypertension    Hypoglycemia    Radiation 10/21/2015-10/23/2015   Anterior neck area 12 gray in 3 fractions   Sleep apnea    Teratoma    Transverse myelitis (HCC)    Past Surgical History:  Procedure Laterality Date   ABDOMINAL HERNIA REPAIR     ABDOMINAL HYSTERECTOMY     CESAREAN SECTION     CRYOTHERAPY  2013   to neck   DERMOID CYST  EXCISION     ECTOPIC PREGNANCY SURGERY     EXCISION MASS NECK Bilateral 10/20/2015   Procedure: EXCISION OF LARGE KELOIDS SEVERE RIGHT AND LEFT NECK WITH PLASTIC RECONSTRUCTION;  Surgeon: Louisa Second, MD;  Location: [facility];  Service: Plastics;  Laterality: Bilateral;   HAND SURGERY     amputation of right index finger   Patient Active Problem List   Diagnosis Date Noted   Type 2 diabetes mellitus with hyperglycemia (HCC) 09/06/2022   Genetic testing 12/17/2020   Family history of breast cancer    Family history of colon cancer    Cervical spinal stenosis 11/19/2019   Gait disturbance 05/21/2019   Transverse myelitis (HCC) 03/13/2019   Numbness 02/13/2019   Right leg weakness 02/13/2019   Lower extremity weakness  01/16/2019   Other incomplete lesion at T10 level of thoracic spinal cord, initial encounter (HCC) 01/16/2019   Pneumonia due to COVID-19 virus 01/16/2019   Notalgia 08/10/2017   Morbid obesity (HCC) 02/18/2017   OSA (obstructive sleep apnea) 01/24/2017   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   Snoring 10/22/2016   Nocturnal dyspnea/choking   10/22/2016   Keloid of skin 09/25/2015   Essential hypertension, benign 09/27/2014   Teratoma 08/07/2014   Severe hypertension 06/08/2013   Dyslipidemia 07/23/2009   SHORTNESS OF BREATH 02/11/2009   HYPERTENSION 05/07/2008   KELOID SCAR 05/07/2008   HEADACHE 05/22/2007    ONSET DATE: referral 02/28/23  REFERRING DIAG: N82.956,O13.08 (ICD-10-CM) - Chronic left shoulder pain M75.52 (ICD-10-CM) - Subacromial bursitis of left shoulder joint  THERAPY DIAG:  Chronic left shoulder pain  Muscle weakness (generalized)  Localized edema  Rationale for Evaluation and Treatment: Rehabilitation  SUBJECTIVE:   SUBJECTIVE STATEMENT: "I can't raise my arm." Pt reports, "they gave me a steroid shot and I was able to do 3-4 sessions with PT but was unable to progress due to too much pain."  Pt reports increased pain starting back in December 2023 and thinks the only thing that could have changed was that she had started using the to battle  ropes in the gym.  Pt reports making modifications to her daily routine to allow for continued engagement in ADLs and work tasks.  Pt reports modifications to dressing routine, and even closing the car door with R arm and frequently will rest L arm in supination on thigh to aid in managing steering wheel.   Pt accompanied by: self  PERTINENT HISTORY: Radiation anterior neck 2017, Transverse myelitis, Hypertension, Diabetes   PRECAUTIONS: None  RED FLAGS: H/o transverse myelitis   WEIGHT BEARING RESTRICTIONS: No  PAIN:  Are you having pain? Yes: NPRS scale: 0-4/10 Pain location: L shoulder, upper  back Pain description: discomfort Aggravating factors: increased reaching, force Relieving factors: limits movement, very guarded  FALLS: Has patient fallen in last 6 months? Yes. 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  PATIENT GOALS: to be pain free and gain as much ROM as I can with my L arm  NEXT MD VISIT: none scheduled  OBJECTIVE:   HAND DOMINANCE: Right  ADLs: Overall ADLs: reports adjusting to "life as the way it is" Grooming: difficulty combing hair, frequently utilizes only R hand due to decreased ROM in LUE UB Dressing: has adapted, reports husband will assist when needed but that she has adapted LB Dressing: leans against the wall or the bed and then  Bathing: uses long handled brush to wash under L arm and wash back Equipment: Long handled sponge  FUNCTIONAL OUTCOME MEASURES: Quick Dash: 59.1% impairement  UPPER EXTREMITY ROM:     Active ROM Right eval Left eval  Shoulder flexion  42  Shoulder abduction  25  Shoulder adduction    Shoulder extension    Shoulder internal rotation  Able to elicit near full ROM  Shoulder external rotation  unable  Elbow flexion  100  Elbow extension  Full ROM  Wrist flexion    Wrist extension    Wrist ulnar deviation    Wrist radial deviation    Wrist pronation    Wrist supination    (Blank rows = not tested)   UPPER EXTREMITY MMT:     MMT Right eval Left eval  Shoulder flexion  2-  Shoulder abduction  2-  Shoulder adduction    Shoulder extension    Shoulder internal rotation    Shoulder  external rotation    Middle trapezius    Lower trapezius    Elbow flexion    Elbow extension    Wrist flexion    Wrist extension    Wrist ulnar deviation    Wrist radial deviation    Wrist pronation    Wrist supination    (Blank rows = not tested)  HAND FUNCTION: Grip strength: Right: 78 lbs; Left: 5 lbs  COORDINATION: Unable to assess due to increased pain with attempts at ROM   SENSATION: Reports intermittent numbness, tingling in finger tips; notes increased dropping of items    EDEMA: mild edema in forearm, hand, and into middle finger  COGNITION: Overall cognitive status: Within functional limits for tasks assessed  OBSERVATIONS: Pt guarding all movements and reports increased onset of pain  with internal/external rotation and attempts at shoulder flexion.   TODAY'S TREATMENT:                                                                                                                              DATE:  04/04/23: Patient familiar with properties of water from prior pol therapy.  New to this pool.  Entered and exited the pool independently with stairs and one hand rail on right.  Limited use of left hand on handrail.  Worked in warm water from 4-4.5 feet deep.   Determined patient's targeted pain goal for therapy session is 4/10.  She has periods of time where she has no pain, but with all attempts to move has some discomfort.   Worked on gentle movement with aide of buoyancy.  Arms floating to surface (shoulder flex/ext)  Reviewed scapular rotation, and neck stretches.  Discussed importance of upright posture with all shoulder exercises.   Worked in standing and in modified plantigrade to address gravity eliminated gentle active range of motion exercises.   Used light dumb bells to address elbow flex/ext in humeral dependent position.  Worked on modified pendulum movements of LUE.   Patient worked up to 4/10 pain - encouraged slow and gentle movement and to stop  resistance bands at home for time being.   Patient with pain 1/10 at end of session.   Needs to discuss and problem solve sleeping positions in land based session.    03/21/23 Discussed aquatic therapy benefits, followed by in-clinic therapy as pt with increased ROM and decreased pain.    PATIENT EDUCATION: Education details: Educated on role and purpose of OT as well as potential interventions and goals for therapy based on initial evaluation findings. Person educated: Patient Education method: Explanation Education comprehension: verbalized understanding  HOME EXERCISE PROGRAM: TBD  GOALS: Goals reviewed with patient? Yes  SHORT TERM GOALS: Target date: 04/22/23  Pt will be independent and compliant with aquatic therapy HEP. Baseline: Goal status: INITIAL  2.  Pt will demonstrate improvements in L shoulder flexion to >/= to 80 degrees  AROM to improve participation in UB dressing tasks Baseline:  Goal status: INITIAL  3.  Pt will demonstrate at least 30 degrees of L shoulder ER AROM to improve ability to complete self-care activities.  Baseline:  Goal status: INITIAL  4.  Pt will report pain at worst rated as </= 5/10 to improve her sleep quality.  Baseline:  Goal status: INITIAL  LONG TERM GOALS: Target date: 05/20/23  Patient will demonstrate at least 4/5 L shoulder strength to improve ability to lift and carry objects  Baseline:  Goal status: INITIAL  2.  Pt will demonstrate improvements in functional use of L UE as evidenced by improved score on QuickDASH to < 45%. Baseline: 59.1% Goal status: INITIAL  3.  Pt will demonstrate improvements in L shoulder flexion to >/= to 120 degrees  AROM to improve participation in UB dressing  tasks Baseline:  Goal status: INITIAL  4.  Pt will demonstrate at least 70 degrees of L shoulder ER AROM to improve ability to complete self-care activities.  Baseline:  Goal status: INITIAL   ASSESSMENT:  CLINICAL  IMPRESSION: Patient seen in aquatic therapy setting.  Patient reports having really good results following transverse myelitis with pool therapy.  Reports shoulder has been painful since December.    PERFORMANCE DEFICITS: in functional skills including ADLs, IADLs, coordination, edema, ROM, strength, pain, Fine motor control, Gross motor control, mobility, body mechanics, endurance, and UE functional use.  IMPAIRMENTS: are limiting patient from ADLs, IADLs, rest and sleep, work, leisure, and social participation.   COMORBIDITIES: may have co-morbidities  that affects occupational performance. Patient will benefit from skilled OT to address above impairments and improve overall function.  MODIFICATION OR ASSISTANCE TO COMPLETE EVALUATION: Min-Moderate modification of tasks or assist with assess necessary to complete an evaluation.  OT OCCUPATIONAL PROFILE AND HISTORY: Detailed assessment: Review of records and additional review of physical, cognitive, psychosocial history related to current functional performance.  CLINICAL DECISION MAKING: Moderate - several treatment options, min-mod task modification necessary  REHAB POTENTIAL: Good  EVALUATION COMPLEXITY: Moderate      PLAN:  OT FREQUENCY: 1-2x/week (start with 1x/week with aquatic and increase as pain/ROM improve)  OT DURATION: 8 weeks  PLANNED INTERVENTIONS: self care/ADL training, therapeutic exercise, therapeutic activity, neuromuscular re-education, manual therapy, passive range of motion, aquatic therapy, ultrasound, iontophoresis, compression bandaging, moist heat, cryotherapy, patient/family education, energy conservation, coping strategies training, DME and/or AE instructions, and Dry needling  RECOMMENDED OTHER SERVICES: aquatic therapy  CONSULTED AND AGREED WITH PLAN OF CARE: Patient  PLAN FOR NEXT SESSION: continue HEP in aquatic therapy, once pain and ROM improving pt may benefit from sessions in clinic to  facilitate increased focus on coordination and further functional tasks.   Collier Salina, OTR/L 04/04/2023, 5:00 PM

## 2023-04-11 ENCOUNTER — Encounter: Payer: Self-pay | Admitting: Occupational Therapy

## 2023-04-11 ENCOUNTER — Ambulatory Visit: Payer: BC Managed Care – PPO | Admitting: Occupational Therapy

## 2023-04-11 DIAGNOSIS — G8929 Other chronic pain: Secondary | ICD-10-CM

## 2023-04-11 DIAGNOSIS — M6281 Muscle weakness (generalized): Secondary | ICD-10-CM

## 2023-04-11 DIAGNOSIS — R600 Localized edema: Secondary | ICD-10-CM

## 2023-04-11 NOTE — Therapy (Signed)
OUTPATIENT OCCUPATIONAL THERAPY ORTHO EVALUATION  Patient Name: [name] MRN: [id number] DOB:April 11, 1971, 52 y.o., female Today's Date: 04/11/2023  PCP: Madelin Headings, MD REFERRING PROVIDER: Richardean Sale, DO  END OF SESSION:  OT End of Session - 04/11/23 1612     Visit Number 3    Number of Visits 13    Date for OT Re-Evaluation 05/20/23    Authorization Type [employer]    OT Start Time 1418    OT Stop Time 1503    OT Time Calculation (min) 45 min    Activity Tolerance Patient tolerated treatment well    Behavior During Therapy WFL for tasks assessed/performed             Past Medical History:  Diagnosis Date   Diabetes mellitus without complication (HCC)    Family history of breast cancer    Family history of colon cancer    Hypertension    Hypoglycemia    Radiation 10/21/2015-10/23/2015   Anterior neck area 12 gray in 3 fractions   Sleep apnea    Teratoma    Transverse myelitis (HCC)    Past Surgical History:  Procedure Laterality Date   ABDOMINAL HERNIA REPAIR     ABDOMINAL HYSTERECTOMY     CESAREAN SECTION     CRYOTHERAPY  2013   to neck   DERMOID CYST  EXCISION     ECTOPIC PREGNANCY SURGERY     EXCISION MASS NECK Bilateral 10/20/2015   Procedure: EXCISION OF LARGE KELOIDS SEVERE RIGHT AND LEFT NECK WITH PLASTIC RECONSTRUCTION;  Surgeon: Louisa Second, MD;  Location: [facility];  Service: Plastics;  Laterality: Bilateral;   HAND SURGERY     amputation of right index finger   Patient Active Problem List   Diagnosis Date Noted   Type 2 diabetes mellitus with hyperglycemia (HCC) 09/06/2022   Genetic testing 12/17/2020   Family history of breast cancer    Family history of colon cancer    Cervical spinal stenosis 11/19/2019   Gait disturbance 05/21/2019   Transverse myelitis (HCC) 03/13/2019   Numbness 02/13/2019   Right leg weakness 02/13/2019   Lower extremity weakness 01/16/2019   Other incomplete lesion at T10 level of  thoracic spinal cord, initial encounter (HCC) 01/16/2019   Pneumonia due to COVID-19 virus 01/16/2019   Notalgia 08/10/2017   Morbid obesity (HCC) 02/18/2017   OSA (obstructive sleep apnea) 01/24/2017   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   Snoring 10/22/2016   Nocturnal dyspnea/choking   10/22/2016   Keloid of skin 09/25/2015   Essential hypertension, benign 09/27/2014   Teratoma 08/07/2014   Severe hypertension 06/08/2013   Dyslipidemia 07/23/2009   SHORTNESS OF BREATH 02/11/2009   HYPERTENSION 05/07/2008   KELOID SCAR 05/07/2008   HEADACHE 05/22/2007    ONSET DATE: referral 02/28/23  REFERRING DIAG: W29.562,Z30.86 (ICD-10-CM) - Chronic left shoulder pain M75.52 (ICD-10-CM) - Subacromial bursitis of left shoulder joint  THERAPY DIAG:  Chronic left shoulder pain  Muscle weakness (generalized)  Localized edema  Rationale for Evaluation and Treatment: Rehabilitation  SUBJECTIVE:   SUBJECTIVE STATEMENT: "I can't raise my arm." Pt reports, "they gave me a steroid shot and I was able to do 3-4 sessions with PT but was unable to progress due to too much pain."  Pt reports increased pain starting back in December 2023 and thinks the only thing that could have changed was that she had started using the to battle ropes in the gym.  Pt reports making modifications  to her daily routine to allow for continued engagement in ADLs and work tasks.  Pt reports modifications to dressing routine, and even closing the car door with R arm and frequently will rest L arm in supination on thigh to aid in managing steering wheel.   Pt accompanied by: self  PERTINENT HISTORY: Radiation anterior neck 2017, Transverse myelitis, Hypertension, Diabetes   PRECAUTIONS: None  RED FLAGS: H/o transverse myelitis   WEIGHT BEARING RESTRICTIONS: No  PAIN:  Are you having pain? Yes: NPRS scale: 3/10 Pain location: L shoulder, upper back Pain description: discomfort Aggravating factors:  increased reaching, force Relieving factors: limits movement, very guarded  FALLS: Has patient fallen in last 6 months? Yes. 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  PATIENT GOALS: to be pain free and gain as much ROM as I can with my L arm  NEXT MD VISIT: none scheduled  OBJECTIVE:   HAND DOMINANCE: Right  ADLs: Overall ADLs: reports adjusting to "life as the way it is" Grooming: difficulty combing hair, frequently utilizes only R hand due to decreased ROM in LUE UB Dressing: has adapted, reports husband will assist when needed but that she has adapted LB Dressing: leans against the wall or the bed and then  Bathing: uses long handled brush to wash under L arm and wash back Equipment: Long handled sponge  FUNCTIONAL OUTCOME MEASURES: Quick Dash: 59.1% impairement  UPPER EXTREMITY ROM:     Active ROM Right eval Left eval  Shoulder flexion  42  Shoulder abduction  25  Shoulder adduction    Shoulder extension    Shoulder internal rotation  Able to elicit near full ROM  Shoulder external rotation  unable  Elbow flexion  100  Elbow extension  Full ROM  Wrist flexion    Wrist extension    Wrist ulnar deviation    Wrist radial deviation    Wrist pronation    Wrist supination    (Blank rows = not tested)   UPPER EXTREMITY MMT:     MMT Right eval Left eval  Shoulder flexion  2-  Shoulder abduction  2-  Shoulder adduction    Shoulder extension    Shoulder internal rotation    Shoulder external rotation    Middle trapezius    Lower trapezius     Elbow flexion    Elbow extension    Wrist flexion    Wrist extension    Wrist ulnar deviation    Wrist radial deviation    Wrist pronation    Wrist supination    (Blank rows = not tested)  HAND FUNCTION: Grip strength: Right: 78 lbs; Left: 5 lbs  COORDINATION: Unable to assess due to increased pain with attempts at ROM   SENSATION: Reports intermittent numbness, tingling in finger tips; notes increased dropping of items    EDEMA: mild edema in forearm, hand, and into middle finger  COGNITION: Overall cognitive status: Within functional limits for tasks assessed  OBSERVATIONS: Pt guarding all movements and reports increased onset of pain with internal/external rotation and attempts at shoulder flexion.  TODAY'S TREATMENT:                                                                                                                              DATE:  04/11/23 Patient seen for pool therapy. Entered and exited the pool independently with stairs and one hand rail on right.  Patient reports pain as being fairly consistent, and there are certain movements which aggravate pain.  During movement - GH Extension and IR seem to consistently provoke symptoms.   Worked on allowing gentle movement in shoulders using buoyancy versus active muscle contraction to lift arms into shoulder flexion, abduction, then continuing to use buoyancy to move arms into horizontal abd/adduction and internal / external rotation.   Provided manual cueing for alignment in seated position.  Patient point tender - anterior aspect of proximal humerus.  Used deep breathing and lateral neck flexion to aide in reducing shoulder tension/pain.   Worked in modified plantigrade position for body on arm movement to increase range of motion to [facility] joint for flexion/ horiz. adduction.  Patient showing approximately 110* shoulder flex in gravity assisted position. Patient without increase in pain - leaving at 3/10 - same as  she arrived.    04/04/23: Patient familiar with properties of water from prior pol therapy.  New to this pool.  Entered and exited the pool independently with stairs and one hand rail on right.  Limited use of left hand on handrail.  Worked in warm water from 4-4.5 feet deep.   Determined patient's targeted pain goal for therapy session is 4/10.  She has periods of time where she has no pain, but with all attempts to move has some discomfort.   Worked on gentle movement with aide of buoyancy.  Arms floating to surface (shoulder flex/ext)  Reviewed scapular rotation, and neck stretches.  Discussed importance of upright posture with all shoulder exercises.   Worked in standing and in modified plantigrade to address gravity eliminated gentle active range of motion exercises.   Used light dumb bells to address elbow flex/ext in humeral dependent position.  Worked on modified pendulum movements of LUE.   Patient worked up to 4/10 pain - encouraged slow and gentle movement and to stop resistance bands at home for time being.   Patient with pain 1/10 at end of session.   Needs to discuss and problem solve sleeping positions in land based session.    03/21/23 Discussed aquatic therapy benefits, followed by in-clinic therapy as pt with increased ROM and decreased pain.    PATIENT EDUCATION: Education details: Educated on role and purpose of OT as well as potential interventions and goals for therapy based on initial evaluation findings. Person educated: Patient Education method: Explanation Education comprehension: verbalized understanding  HOME EXERCISE PROGRAM: TBD  GOALS: Goals reviewed with patient? Yes  SHORT TERM GOALS: Target date: 04/22/23  Pt will be independent and compliant with aquatic therapy HEP. Baseline: Goal status: INITIAL  2.  Pt will demonstrate improvements in L shoulder flexion to >/= to 80 degrees  AROM to improve participation in UB dressing tasks Baseline:  Goal  status: INITIAL  3.  Pt will demonstrate at least 30 degrees of L shoulder ER AROM to improve ability to complete self-care activities.  Baseline:  Goal status: INITIAL  4.  Pt will report pain at worst rated as </= 5/10 to improve her sleep quality.  Baseline:  Goal status: INITIAL  LONG TERM GOALS: Target date: 05/20/23  Patient will demonstrate at least 4/5 L shoulder strength to improve ability to lift and carry objects  Baseline:  Goal status: INITIAL  2.  Pt will demonstrate improvements in functional use of L UE as evidenced by improved score on QuickDASH to < 45%. Baseline: 59.1% Goal status: INITIAL  3.  Pt will demonstrate improvements in L shoulder flexion to >/= to 120 degrees  AROM to improve participation in UB dressing tasks Baseline:  Goal status: INITIAL  4.  Pt will demonstrate at least 70 degrees of L shoulder ER AROM to improve ability to complete self-care activities.  Baseline:  Goal status: INITIAL   ASSESSMENT:  CLINICAL IMPRESSION: Patient reports scapula exercises are helping and she is doing frequently throughout the day.      PERFORMANCE DEFICITS: in functional skills including ADLs, IADLs, coordination, edema, ROM, strength, pain, Fine motor control, Gross motor control, mobility, body mechanics, endurance, and UE functional use.  IMPAIRMENTS: are limiting patient from ADLs, IADLs, rest and sleep, work, leisure, and social participation.   COMORBIDITIES: may have co-morbidities  that affects occupational performance. Patient will benefit from skilled OT to address above impairments and improve overall function.  MODIFICATION OR ASSISTANCE TO COMPLETE EVALUATION: Min-Moderate modification of tasks or assist with assess necessary to complete an evaluation.  OT OCCUPATIONAL PROFILE AND HISTORY: Detailed assessment: Review of records and additional review of physical, cognitive, psychosocial history related to current functional  performance.  CLINICAL DECISION MAKING: Moderate - several treatment options, min-mod task modification necessary  REHAB POTENTIAL: Good  EVALUATION COMPLEXITY: Moderate      PLAN:  OT FREQUENCY: 1-2x/week (start with 1x/week with aquatic and increase as pain/ROM improve)  OT DURATION: 8 weeks  PLANNED INTERVENTIONS: self care/ADL training, therapeutic exercise, therapeutic activity, neuromuscular re-education, manual therapy, passive range of motion, aquatic therapy, ultrasound, iontophoresis, compression bandaging, moist heat, cryotherapy, patient/family education, energy conservation, coping strategies training, DME and/or AE instructions, and Dry needling  RECOMMENDED OTHER SERVICES: aquatic therapy  CONSULTED AND AGREED WITH PLAN OF CARE: Patient  PLAN FOR NEXT SESSION: continue HEP in aquatic therapy, once pain and ROM improving pt may benefit from sessions in clinic to facilitate increased focus on coordination and further functional tasks.   Collier Salina, OTR/L 04/11/2023, 4:14 PM

## 2023-04-25 ENCOUNTER — Ambulatory Visit: Payer: BC Managed Care – PPO | Admitting: Family Medicine

## 2023-04-25 ENCOUNTER — Ambulatory Visit: Payer: BC Managed Care – PPO | Admitting: Occupational Therapy

## 2023-04-25 ENCOUNTER — Encounter: Payer: Self-pay | Admitting: Family Medicine

## 2023-04-25 VITALS — BP 100/76 | HR 98 | Temp 98.2°F | Wt 237.0 lb

## 2023-04-25 DIAGNOSIS — U071 COVID-19: Secondary | ICD-10-CM

## 2023-04-25 LAB — POC COVID19 BINAXNOW: SARS Coronavirus 2 Ag: POSITIVE — AB

## 2023-04-25 LAB — POCT INFLUENZA A/B
Influenza A, POC: NEGATIVE
Influenza B, POC: NEGATIVE

## 2023-04-25 MED ORDER — NIRMATRELVIR/RITONAVIR (PAXLOVID)TABLET: 3.0000 | ORAL_TABLET | Freq: Two times a day (BID) | ORAL | 0 refills | Status: AC

## 2023-04-25 NOTE — Addendum Note (Signed)
Addended by: Carola Rhine on: 04/25/2023 04:43 PM   Modules accepted: Orders

## 2023-04-25 NOTE — Progress Notes (Signed)
   Subjective:    Patient ID: [name], female    DOB: March 03, 1971, 52 y.o.   MRN: [id number]  HPI Here for 6 days of fever, dry cough, mild SOB, body aches, and ST. Taking Dayquil and Tylenol. She tested positive for Covid here.     Review of Systems  Constitutional:  Positive for fever.  HENT:  Positive for congestion and sore throat. Negative for ear pain, postnasal drip and sinus pressure.   Eyes: Negative.   Respiratory:  Positive for cough and shortness of breath. Negative for wheezing.        Objective:   Physical Exam Constitutional:      General: She is not in acute distress.    Appearance: Normal appearance.  HENT:     Right Ear: Tympanic membrane, ear canal and external ear normal.     Left Ear: Tympanic membrane, ear canal and external ear normal.     Nose: Nose normal.     Mouth/Throat:     Pharynx: Oropharynx is clear.  Eyes:     Conjunctiva/sclera: Conjunctivae normal.  Pulmonary:     Effort: Pulmonary effort is normal.     Breath sounds: Normal breath sounds.  Lymphadenopathy:     Cervical: No cervical adenopathy.  Neurological:     Mental Status: She is alert.           Assessment & Plan:  Covid infection. Treat with 5 days of Paxlovid.  Gershon Crane, MD

## 2023-05-02 ENCOUNTER — Encounter: Payer: Self-pay | Admitting: Occupational Therapy

## 2023-05-02 ENCOUNTER — Ambulatory Visit: Payer: BC Managed Care – PPO | Attending: Internal Medicine | Admitting: Occupational Therapy

## 2023-05-02 DIAGNOSIS — R600 Localized edema: Secondary | ICD-10-CM | POA: Insufficient documentation

## 2023-05-02 DIAGNOSIS — M25512 Pain in left shoulder: Secondary | ICD-10-CM | POA: Insufficient documentation

## 2023-05-02 DIAGNOSIS — M6281 Muscle weakness (generalized): Secondary | ICD-10-CM | POA: Diagnosis not present

## 2023-05-02 DIAGNOSIS — G8929 Other chronic pain: Secondary | ICD-10-CM | POA: Insufficient documentation

## 2023-05-02 NOTE — Therapy (Signed)
OUTPATIENT OCCUPATIONAL THERAPY ORTHO EVALUATION  Patient Name: [name] MRN: [id number] DOB:04/12/1971, 52 y.o., female Today's Date: 05/02/2023  PCP: Madelin Headings, MD REFERRING PROVIDER: Richardean Sale, DO  END OF SESSION:  OT End of Session - 05/02/23 1800     Visit Number 4    Number of Visits 13    Date for OT Re-Evaluation 05/20/23    Authorization Type [employer]    OT Start Time 1420    OT Stop Time 1500    OT Time Calculation (min) 40 min    Equipment Utilized During Treatment floatation equipment    Activity Tolerance Patient tolerated treatment well    Behavior During Therapy WFL for tasks assessed/performed             Past Medical History:  Diagnosis Date   Diabetes mellitus without complication (HCC)    Family history of breast cancer    Family history of colon cancer    Hypertension    Hypoglycemia    Radiation 10/21/2015-10/23/2015   Anterior neck area 12 gray in 3 fractions   Sleep apnea    Teratoma    Transverse myelitis (HCC)    Past Surgical History:  Procedure Laterality Date   ABDOMINAL HERNIA REPAIR     ABDOMINAL HYSTERECTOMY     CESAREAN SECTION     CRYOTHERAPY  2013   to neck   DERMOID CYST  EXCISION     ECTOPIC PREGNANCY SURGERY     EXCISION MASS NECK Bilateral 10/20/2015   Procedure: EXCISION OF LARGE KELOIDS SEVERE RIGHT AND LEFT NECK WITH PLASTIC RECONSTRUCTION;  Surgeon: Louisa Second, MD;  Location: [facility];  Service: Plastics;  Laterality: Bilateral;   HAND SURGERY     amputation of right index finger   Patient Active Problem List   Diagnosis Date Noted   Type 2 diabetes mellitus with hyperglycemia (HCC) 09/06/2022   Genetic testing 12/17/2020   Family history of breast cancer    Family history of colon cancer    Cervical spinal stenosis 11/19/2019   Gait disturbance 05/21/2019   Transverse myelitis (HCC) 03/13/2019   Numbness 02/13/2019   Right leg weakness 02/13/2019   Lower extremity weakness  01/16/2019   Other incomplete lesion at T10 level of thoracic spinal cord, initial encounter (HCC) 01/16/2019   Pneumonia due to COVID-19 virus 01/16/2019   Notalgia 08/10/2017   Morbid obesity (HCC) 02/18/2017   OSA (obstructive sleep apnea) 01/24/2017   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   Snoring 10/22/2016   Nocturnal dyspnea/choking   10/22/2016   Keloid of skin 09/25/2015   Essential hypertension, benign 09/27/2014   Teratoma 08/07/2014   Severe hypertension 06/08/2013   Dyslipidemia 07/23/2009   SHORTNESS OF BREATH 02/11/2009   HYPERTENSION 05/07/2008   KELOID SCAR 05/07/2008   HEADACHE 05/22/2007    ONSET DATE: referral 02/28/23  REFERRING DIAG: Y86.578,I69.62 (ICD-10-CM) - Chronic left shoulder pain M75.52 (ICD-10-CM) - Subacromial bursitis of left shoulder joint  THERAPY DIAG:  Chronic left shoulder pain  Muscle weakness (generalized)  Localized edema  Rationale for Evaluation and Treatment: Rehabilitation  SUBJECTIVE:   SUBJECTIVE STATEMENT: "I can't raise my arm." Pt reports, "they gave me a steroid shot and I was able to do 3-4 sessions with PT but was unable to progress due to too much pain."  Pt reports increased pain starting back in December 2023 and thinks the only thing that could have changed was that she had started using the to battle  ropes in the gym.  Pt reports making modifications to her daily routine to allow for continued engagement in ADLs and work tasks.  Pt reports modifications to dressing routine, and even closing the car door with R arm and frequently will rest L arm in supination on thigh to aid in managing steering wheel.   Pt accompanied by: self  PERTINENT HISTORY: Radiation anterior neck 2017, Transverse myelitis, Hypertension, Diabetes   PRECAUTIONS: None  RED FLAGS: H/o transverse myelitis   WEIGHT BEARING RESTRICTIONS: No  PAIN:  Are you having pain? Yes: NPRS scale: 3/10 Pain location: L shoulder, upper  back Pain description: discomfort Aggravating factors: increased reaching, force Relieving factors: limits movement, very guarded  FALLS: Has patient fallen in last 6 months? Yes. 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  PATIENT GOALS: to be pain free and gain as much ROM as I can with my L arm  NEXT MD VISIT: none scheduled  OBJECTIVE:   HAND DOMINANCE: Right  ADLs: Overall ADLs: reports adjusting to "life as the way it is" Grooming: difficulty combing hair, frequently utilizes only R hand due to decreased ROM in LUE UB Dressing: has adapted, reports husband will assist when needed but that she has adapted LB Dressing: leans against the wall or the bed and then  Bathing: uses long handled brush to wash under L arm and wash back Equipment: Long handled sponge  FUNCTIONAL OUTCOME MEASURES: Quick Dash: 59.1% impairement  UPPER EXTREMITY ROM:     Active ROM Right eval Left eval  Shoulder flexion  42  Shoulder abduction  25  Shoulder adduction    Shoulder extension    Shoulder internal rotation  Able to elicit near full ROM  Shoulder external rotation  unable  Elbow flexion  100  Elbow extension  Full ROM  Wrist flexion    Wrist extension    Wrist ulnar deviation    Wrist radial deviation    Wrist pronation    Wrist supination    (Blank rows = not tested)   UPPER EXTREMITY MMT:     MMT Right eval Left eval  Shoulder flexion  2-  Shoulder abduction  2-  Shoulder adduction    Shoulder extension    Shoulder internal rotation    Shoulder  external rotation    Middle trapezius    Lower trapezius    Elbow flexion    Elbow extension    Wrist flexion    Wrist extension    Wrist ulnar deviation    Wrist radial deviation    Wrist pronation    Wrist supination    (Blank rows = not tested)  HAND FUNCTION: Grip strength: Right: 78 lbs; Left: 5 lbs  COORDINATION: Unable to assess due to increased pain with attempts at ROM   SENSATION: Reports intermittent numbness, tingling in finger tips; notes increased dropping of items    EDEMA: mild edema in forearm, hand, and into middle finger  COGNITION: Overall cognitive status: Within functional limits for tasks assessed  OBSERVATIONS: Pt guarding all movements and reports increased onset of pain  with internal/external rotation and attempts at shoulder flexion.   TODAY'S TREATMENT:                                                                                                                              DATE:  05/02/23: Patient reports missing therapy last week due to illness (COVID) but has finished course of treatment and is feeling better.  Patient entered and exited the pool independently with one railing.  Session took place in lap pool versus therapy pool due to needed repairs.  Session took place in 3.6 ft water.   Patient reports improved range of motion and reduced pain since last visit.  Reports using pillow for support and that is helping with sleep.  Discussed plan for clinic OT and patient wishes to wait until aquatic therapy concludes - 3-4 more sessions.   Patient feels shoulder is improving.   Worked on allowing gentle movement in shoulders using buoyancy assisted and resisted movements active contraction against gravity to lift arms into shoulder flexion, abduction, then into horizontal abd/adduction and internal / external rotation.  Patient showing improved awareness of posture and needing only intermittent cueing to depress shoulder girdle with shoulder flexion.    Modified plantigrade position on steps to use body on arm movements to provide greater stretch and range of motion to left shoulder.  Patient very willing to work through discomfort, and works/ moves well at slower pace to allow muscles to react appropriately.    04/11/23 Patient seen for pool therapy. Entered and exited the pool independently with stairs and one hand rail on right.  Patient reports pain as being fairly consistent, and there are certain movements which aggravate pain.  During movement - GH Extension and IR seem to consistently provoke symptoms.   Worked on allowing gentle movement in shoulders using buoyancy versus active muscle contraction to lift arms into shoulder flexion, abduction, then continuing to use buoyancy to move arms into horizontal abd/adduction and internal / external rotation.   Provided manual cueing for alignment in seated position.  Patient point tender - anterior aspect of proximal humerus.  Used deep breathing and lateral neck flexion to aide in reducing shoulder tension/pain.   Worked in modified plantigrade position for body on arm movement to increase range of motion to [facility] joint for flexion/ horiz. adduction.  Patient showing approximately 110* shoulder flex in gravity assisted position. Patient without increase in pain - leaving at 3/10 - same as she arrived.    04/04/23: Patient familiar with properties of water from prior pol therapy.  New to this pool.  Entered and exited the pool independently with stairs and one hand rail on right.  Limited use of left hand on handrail.  Worked in warm water from 4-4.5 feet deep.   Determined patient's targeted pain goal for therapy session is 4/10.  She has periods of time where she has no pain, but with all attempts to move has some  discomfort.   Worked on gentle movement with aide of buoyancy.  Arms floating to surface (shoulder flex/ext)  Reviewed scapular rotation, and neck stretches.  Discussed importance of upright  posture with all shoulder exercises.   Worked in standing and in modified plantigrade to address gravity eliminated gentle active range of motion exercises.   Used light dumb bells to address elbow flex/ext in humeral dependent position.  Worked on modified pendulum movements of LUE.   Patient worked up to 4/10 pain - encouraged slow and gentle movement and to stop resistance bands at home for time being.   Patient with pain 1/10 at end of session.   Needs to discuss and problem solve sleeping positions in land based session.    03/21/23 Discussed aquatic therapy benefits, followed by in-clinic therapy as pt with increased ROM and decreased pain.    PATIENT EDUCATION: Education details: Educated on role and purpose of OT as well as potential interventions and goals for therapy based on initial evaluation findings. Person educated: Patient Education method: Explanation Education comprehension: verbalized understanding  HOME EXERCISE PROGRAM: TBD  GOALS: Goals reviewed with patient? Yes  SHORT TERM GOALS: Target date: 04/22/23  Pt will be independent and compliant with aquatic therapy HEP. Baseline: Goal status: INITIAL  2.  Pt will demonstrate improvements in L shoulder flexion to >/= to 80 degrees  AROM to improve participation in UB dressing tasks Baseline:  Goal status: INITIAL  3.  Pt will demonstrate at least 30 degrees of L shoulder ER AROM to improve ability to complete self-care activities.  Baseline:  Goal status: INITIAL  4.  Pt will report pain at worst rated as </= 5/10 to improve her sleep quality.  Baseline:  Goal status: INITIAL  LONG TERM GOALS: Target date: 05/20/23  Patient will demonstrate at least 4/5 L shoulder strength to improve ability to lift and carry objects  Baseline:  Goal status: INITIAL  2.  Pt will demonstrate improvements in functional use of L UE as evidenced by improved score on QuickDASH to < 45%. Baseline: 59.1% Goal status:  INITIAL  3.  Pt will demonstrate improvements in L shoulder flexion to >/= to 120 degrees  AROM to improve participation in UB dressing tasks Baseline:  Goal status: INITIAL  4.  Pt will demonstrate at least 70 degrees of L shoulder ER AROM to improve ability to complete self-care activities.  Baseline:  Goal status: INITIAL   ASSESSMENT:  CLINICAL IMPRESSION: Patient reports scapula exercises are helping and she is doing frequently throughout the day.      PERFORMANCE DEFICITS: in functional skills including ADLs, IADLs, coordination, edema, ROM, strength, pain, Fine motor control, Gross motor control, mobility, body mechanics, endurance, and UE functional use.  IMPAIRMENTS: are limiting patient from ADLs, IADLs, rest and sleep, work, leisure, and social participation.   COMORBIDITIES: may have co-morbidities  that affects occupational performance. Patient will benefit from skilled OT to address above impairments and improve overall function.  MODIFICATION OR ASSISTANCE TO COMPLETE EVALUATION: Min-Moderate modification of tasks or assist with assess necessary to complete an evaluation.  OT OCCUPATIONAL PROFILE AND HISTORY: Detailed assessment: Review of records and additional review of physical, cognitive, psychosocial history related to current functional performance.  CLINICAL DECISION MAKING: Moderate - several treatment options, min-mod task modification necessary  REHAB POTENTIAL: Good  EVALUATION COMPLEXITY: Moderate      PLAN:  OT FREQUENCY: 1-2x/week (start with 1x/week with aquatic and increase as pain/ROM improve)  OT DURATION: 8 weeks  PLANNED INTERVENTIONS: self care/ADL training, therapeutic exercise, therapeutic activity, neuromuscular re-education, manual therapy, passive range of motion, aquatic therapy, ultrasound, iontophoresis, compression bandaging, moist heat, cryotherapy, patient/family education, energy conservation, coping strategies training, DME  and/or AE instructions, and Dry needling  RECOMMENDED OTHER SERVICES: aquatic therapy  CONSULTED AND AGREED WITH PLAN OF CARE: Patient  PLAN FOR NEXT SESSION: continue HEP in aquatic therapy, once pain and ROM improving pt may benefit from sessions in clinic to facilitate increased focus on coordination and further functional tasks.   Collier Salina, OTR/L 05/02/2023, 6:01 PM

## 2023-05-09 ENCOUNTER — Ambulatory Visit: Payer: BC Managed Care – PPO | Admitting: Occupational Therapy

## 2023-05-09 ENCOUNTER — Encounter: Payer: Self-pay | Admitting: Occupational Therapy

## 2023-05-09 DIAGNOSIS — M6281 Muscle weakness (generalized): Secondary | ICD-10-CM

## 2023-05-09 DIAGNOSIS — G8929 Other chronic pain: Secondary | ICD-10-CM

## 2023-05-09 DIAGNOSIS — R600 Localized edema: Secondary | ICD-10-CM | POA: Diagnosis not present

## 2023-05-09 DIAGNOSIS — M25512 Pain in left shoulder: Secondary | ICD-10-CM | POA: Diagnosis not present

## 2023-05-09 NOTE — Therapy (Signed)
OUTPATIENT OCCUPATIONAL THERAPY ORTHO EVALUATION  Patient Name: PANAYIOTA BRANDNER MRN: [id number] DOB:[birth year], 52 y.o., female Today's Date: 05/09/2023  PCP: Madelin Headings, MD REFERRING PROVIDER: Richardean Sale, DO  END OF SESSION:  OT End of Session - 05/09/23 2020     Visit Number 5    Number of Visits 13    Date for OT Re-Evaluation 05/20/23    Authorization Type BCBS    OT Start Time 1420    OT Stop Time 1505    OT Time Calculation (min) 45 min    Equipment Utilized During Treatment floatation equipment    Activity Tolerance Patient tolerated treatment well    Behavior During Therapy WFL for tasks assessed/performed             Past Medical History:  Diagnosis Date   Diabetes mellitus without complication (HCC)    Family history of breast cancer    Family history of colon cancer    Hypertension    Hypoglycemia    Radiation 10/21/2015-10/23/2015   Anterior neck area 12 gray in 3 fractions   Sleep apnea    Teratoma    Transverse myelitis (HCC)    Past Surgical History:  Procedure Laterality Date   ABDOMINAL HERNIA REPAIR     ABDOMINAL HYSTERECTOMY     CESAREAN SECTION     CRYOTHERAPY  2013   to neck   DERMOID CYST  EXCISION     ECTOPIC PREGNANCY SURGERY     EXCISION MASS NECK Bilateral 10/20/2015   Procedure: EXCISION OF LARGE KELOIDS SEVERE RIGHT AND LEFT NECK WITH PLASTIC RECONSTRUCTION;  Surgeon: Louisa Second, MD;  Location: [facility];  Service: Plastics;  Laterality: Bilateral;   HAND SURGERY     amputation of right index finger   Patient Active Problem List   Diagnosis Date Noted   Type 2 diabetes mellitus with hyperglycemia (HCC) 09/06/2022   Genetic testing 12/17/2020   Family history of breast cancer    Family history of colon cancer    Cervical spinal stenosis 11/19/2019   Gait disturbance 05/21/2019   Transverse myelitis (HCC) 03/13/2019   Numbness 02/13/2019   Right leg weakness 02/13/2019   Lower extremity weakness  01/16/2019   Other incomplete lesion at T10 level of thoracic spinal cord, initial encounter (HCC) 01/16/2019   Pneumonia due to COVID-19 virus 01/16/2019   Notalgia 08/10/2017   Morbid obesity (HCC) 02/18/2017   OSA (obstructive sleep apnea) 01/24/2017   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   Snoring 10/22/2016   Nocturnal dyspnea/choking   10/22/2016   Keloid of skin 09/25/2015   Essential hypertension, benign 09/27/2014   Teratoma 08/07/2014   Severe hypertension 06/08/2013   Dyslipidemia 07/23/2009   SHORTNESS OF BREATH 02/11/2009   HYPERTENSION 05/07/2008   KELOID SCAR 05/07/2008   HEADACHE 05/22/2007    ONSET DATE: referral 02/28/23  REFERRING DIAG: B28.413,K44.01 (ICD-10-CM) - Chronic left shoulder pain M75.52 (ICD-10-CM) - Subacromial bursitis of left shoulder joint  THERAPY DIAG:  Chronic left shoulder pain  Muscle weakness (generalized)  Localized edema  Rationale for Evaluation and Treatment: Rehabilitation  SUBJECTIVE:   SUBJECTIVE STATEMENT: "I can't raise my arm." Pt reports, "they gave me a steroid shot and I was able to do 3-4 sessions with PT but was unable to progress due to too much pain."  Pt reports increased pain starting back in December 2023 and thinks the only thing that could have changed was that she had started using the to battle  ropes in the gym.  Pt reports making modifications to her daily routine to allow for continued engagement in ADLs and work tasks.  Pt reports modifications to dressing routine, and even closing the car door with R arm and frequently will rest L arm in supination on thigh to aid in managing steering wheel.   Pt accompanied by: self  PERTINENT HISTORY: Radiation anterior neck 2017, Transverse myelitis, Hypertension, Diabetes   PRECAUTIONS: None  RED FLAGS: H/o transverse myelitis   WEIGHT BEARING RESTRICTIONS: No  PAIN:  Are you having pain? Yes: NPRS scale: 3/10 Pain location: L shoulder, upper  back Pain description: discomfort Aggravating factors: increased reaching, force Relieving factors: limits movement, very guarded  FALLS: Has patient fallen in last 6 months? Yes. 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  PATIENT GOALS: to be pain free and gain as much ROM as I can with my L arm  NEXT MD VISIT: none scheduled  OBJECTIVE:   HAND DOMINANCE: Right  ADLs: Overall ADLs: reports adjusting to "life as the way it is" Grooming: difficulty combing hair, frequently utilizes only R hand due to decreased ROM in LUE UB Dressing: has adapted, reports husband will assist when needed but that she has adapted LB Dressing: leans against the wall or the bed and then  Bathing: uses long handled brush to wash under L arm and wash back Equipment: Long handled sponge  FUNCTIONAL OUTCOME MEASURES: Quick Dash: 59.1% impairement  UPPER EXTREMITY ROM:     Active ROM Right eval Left eval  Shoulder flexion  42  Shoulder abduction  25  Shoulder adduction    Shoulder extension    Shoulder internal rotation  Able to elicit near full ROM  Shoulder external rotation  unable  Elbow flexion  100  Elbow extension  Full ROM  Wrist flexion    Wrist extension    Wrist ulnar deviation    Wrist radial deviation    Wrist pronation    Wrist supination    (Blank rows = not tested)   UPPER EXTREMITY MMT:     MMT Right eval Left eval  Shoulder flexion  2-  Shoulder abduction  2-  Shoulder adduction    Shoulder extension    Shoulder internal rotation    Shoulder  external rotation    Middle trapezius    Lower trapezius    Elbow flexion    Elbow extension    Wrist flexion    Wrist extension    Wrist ulnar deviation    Wrist radial deviation    Wrist pronation    Wrist supination    (Blank rows = not tested)  HAND FUNCTION: Grip strength: Right: 78 lbs; Left: 5 lbs  COORDINATION: Unable to assess due to increased pain with attempts at ROM   SENSATION: Reports intermittent numbness, tingling in finger tips; notes increased dropping of items    EDEMA: mild edema in forearm, hand, and into middle finger  COGNITION: Overall cognitive status: Within functional limits for tasks assessed  OBSERVATIONS: Pt guarding all movements and reports increased onset of pain  with internal/external rotation and attempts at shoulder flexion.   TODAY'S TREATMENT:                                                                                                                              DATE:  05/09/23: Patient seen for pool therapy. Entered and exited the pool independently with stairs and one hand rail on right.   Patient reports continued small improvements in range of motion since last visit, however, reports feeling increased soreness after last session.    Discussed plan for clinic OT and patient wishes to wait until aquatic therapy concludes - 2-3 more sessions.   Patient feels shoulder is improving.   Extension and IR still consistently provoke symptoms but is getting slightly better range - and notes improvement in donning shirt.    Worked in modified plantigrade position at underwater seat, and also at ladder for body on arm movement to increase range of motion to [facility] joint for flexion/ horiz. adduction.  Also supported arms on ede of pool and used lowering body into water to improve shoulder range of motion.   Patient with decrease in pain - leaving at 2/10 (started session at 2.5-3/10)   03/21/23 Discussed aquatic therapy benefits, followed by  in-clinic therapy as pt with increased ROM and decreased pain.    PATIENT EDUCATION: Education details: Educated on role and purpose of OT as well as potential interventions and goals for therapy based on initial evaluation findings. Person educated: Patient Education method: Explanation Education comprehension: verbalized understanding  HOME EXERCISE PROGRAM: TBD  GOALS: Goals reviewed with patient? Yes  SHORT TERM GOALS: Target date: 04/22/23  Pt will be independent and compliant with aquatic therapy HEP. Baseline: Goal status: progressing  2.  Pt will demonstrate improvements in L shoulder flexion to >/= to 80 degrees  AROM to improve participation in UB dressing tasks Baseline:  Goal status: Met  3.  Pt will demonstrate at least 30 degrees of L shoulder ER AROM to improve ability to complete self-care activities.  Baseline:  Goal status: progressing  4.  Pt will report pain at worst rated as </= 5/10 to improve her sleep quality.  Baseline:  Goal status: Progressing  LONG TERM GOALS: Target date: 05/20/23  Patient will demonstrate at least 4/5 L shoulder strength to improve ability to lift and carry objects  Baseline:  Goal status: INITIAL  2.  Pt will demonstrate improvements in functional use of L UE as evidenced by improved score on QuickDASH to < 45%. Baseline: 59.1% Goal status: INITIAL  3.  Pt will demonstrate improvements in L shoulder flexion to >/= to 120 degrees  AROM to improve participation in UB dressing tasks Baseline:  Goal status: INITIAL  4.  Pt will demonstrate at least 70 degrees of L shoulder ER AROM to improve ability to complete self-care activities.  Baseline:  Goal status: INITIAL   ASSESSMENT:  CLINICAL IMPRESSION: Patient reports breathing and posture  activities are helping with pain and even has her husband doing them!    PERFORMANCE DEFICITS: in functional skills including ADLs, IADLs, coordination, edema, ROM, strength, pain, Fine  motor control, Gross motor control, mobility, body mechanics, endurance, and UE functional use.  IMPAIRMENTS: are limiting patient from ADLs, IADLs, rest and sleep, work, leisure, and social participation.   COMORBIDITIES: may have co-morbidities  that affects occupational performance. Patient will benefit from skilled OT to address above impairments and improve overall function.  MODIFICATION OR ASSISTANCE TO COMPLETE EVALUATION: Min-Moderate modification of tasks or assist with assess necessary to complete an evaluation.  OT OCCUPATIONAL PROFILE AND HISTORY: Detailed assessment: Review of records and additional review of physical, cognitive, psychosocial history related to current functional performance.  CLINICAL DECISION MAKING: Moderate - several treatment options, min-mod task modification necessary  REHAB POTENTIAL: Good  EVALUATION COMPLEXITY: Moderate      PLAN:  OT FREQUENCY: 1-2x/week (start with 1x/week with aquatic and increase as pain/ROM improve)  OT DURATION: 8 weeks  PLANNED INTERVENTIONS: self care/ADL training, therapeutic exercise, therapeutic activity, neuromuscular re-education, manual therapy, passive range of motion, aquatic therapy, ultrasound, iontophoresis, compression bandaging, moist heat, cryotherapy, patient/family education, energy conservation, coping strategies training, DME and/or AE instructions, and Dry needling  RECOMMENDED OTHER SERVICES: aquatic therapy  CONSULTED AND AGREED WITH PLAN OF CARE: Patient  PLAN FOR NEXT SESSION: continue HEP in aquatic therapy, once pain and ROM improving pt may benefit from sessions in clinic to facilitate increased focus on coordination and further functional tasks.   Collier Salina, OTR/L 05/09/2023, 8:22 PM

## 2023-05-23 ENCOUNTER — Ambulatory Visit: Payer: BC Managed Care – PPO | Attending: Internal Medicine | Admitting: Occupational Therapy

## 2023-05-23 ENCOUNTER — Encounter: Payer: Self-pay | Admitting: Occupational Therapy

## 2023-05-23 DIAGNOSIS — M25512 Pain in left shoulder: Secondary | ICD-10-CM | POA: Insufficient documentation

## 2023-05-23 DIAGNOSIS — G8929 Other chronic pain: Secondary | ICD-10-CM | POA: Insufficient documentation

## 2023-05-23 DIAGNOSIS — R600 Localized edema: Secondary | ICD-10-CM | POA: Insufficient documentation

## 2023-05-23 DIAGNOSIS — M6281 Muscle weakness (generalized): Secondary | ICD-10-CM | POA: Diagnosis not present

## 2023-05-23 NOTE — Addendum Note (Signed)
Addended by: Collier Salina on: 05/23/2023 05:49 PM   Modules accepted: Orders

## 2023-05-23 NOTE — Therapy (Signed)
OUTPATIENT OCCUPATIONAL THERAPY ORTHO EVALUATION  Patient Name: [name] MRN: [id number] DOB:[birth year], 52 y.o., female Today's Date: 05/23/2023  PCP: Madelin Headings, MD REFERRING PROVIDER: Richardean Sale, DO  END OF SESSION:  OT End of Session - 05/23/23 1730     Visit Number 6    Number of Visits 13    Date for OT Re-Evaluation 07/20/23    Authorization Type [employer]    OT Start Time 1420    OT Stop Time 1500    OT Time Calculation (min) 40 min    Equipment Utilized During Treatment floatation equipment    Activity Tolerance Patient tolerated treatment well    Behavior During Therapy WFL for tasks assessed/performed             Past Medical History:  Diagnosis Date   Diabetes mellitus without complication (HCC)    Family history of breast cancer    Family history of colon cancer    Hypertension    Hypoglycemia    Radiation 10/21/2015-10/23/2015   Anterior neck area 12 gray in 3 fractions   Sleep apnea    Teratoma    Transverse myelitis (HCC)    Past Surgical History:  Procedure Laterality Date   ABDOMINAL HERNIA REPAIR     ABDOMINAL HYSTERECTOMY     CESAREAN SECTION     CRYOTHERAPY  2013   to neck   DERMOID CYST  EXCISION     ECTOPIC PREGNANCY SURGERY     EXCISION MASS NECK Bilateral 10/20/2015   Procedure: EXCISION OF LARGE KELOIDS SEVERE RIGHT AND LEFT NECK WITH PLASTIC RECONSTRUCTION;  Surgeon: Louisa Second, MD;  Location: [facility];  Service: Plastics;  Laterality: Bilateral;   HAND SURGERY     amputation of right index finger   Patient Active Problem List   Diagnosis Date Noted   Type 2 diabetes mellitus with hyperglycemia (HCC) 09/06/2022   Genetic testing 12/17/2020   Family history of breast cancer    Family history of colon cancer    Cervical spinal stenosis 11/19/2019   Gait disturbance 05/21/2019   Transverse myelitis (HCC) 03/13/2019   Numbness 02/13/2019   Right leg weakness 02/13/2019   Lower extremity weakness  01/16/2019   Other incomplete lesion at T10 level of thoracic spinal cord, initial encounter (HCC) 01/16/2019   Pneumonia due to COVID-19 virus 01/16/2019   Notalgia 08/10/2017   Morbid obesity (HCC) 02/18/2017   OSA (obstructive sleep apnea) 01/24/2017   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   Snoring 10/22/2016   Nocturnal dyspnea/choking   10/22/2016   Keloid of skin 09/25/2015   Essential hypertension, benign 09/27/2014   Teratoma 08/07/2014   Severe hypertension 06/08/2013   Dyslipidemia 07/23/2009   SHORTNESS OF BREATH 02/11/2009   HYPERTENSION 05/07/2008   KELOID SCAR 05/07/2008   HEADACHE 05/22/2007    ONSET DATE: referral 02/28/23  REFERRING DIAG: Q65.784,O96.29 (ICD-10-CM) - Chronic left shoulder pain M75.52 (ICD-10-CM) - Subacromial bursitis of left shoulder joint  THERAPY DIAG:  Chronic left shoulder pain  Muscle weakness (generalized)  Localized edema  Rationale for Evaluation and Treatment: Rehabilitation  SUBJECTIVE:   SUBJECTIVE STATEMENT: "I can't raise my arm." Pt reports, "they gave me a steroid shot and I was able to do 3-4 sessions with PT but was unable to progress due to too much pain."  Pt reports increased pain starting back in December 2023 and thinks the only thing that could have changed was that she had started using the to battle  ropes in the gym.  Pt reports making modifications to her daily routine to allow for continued engagement in ADLs and work tasks.  Pt reports modifications to dressing routine, and even closing the car door with R arm and frequently will rest L arm in supination on thigh to aid in managing steering wheel.   Pt accompanied by: self  PERTINENT HISTORY: Radiation anterior neck 2017, Transverse myelitis, Hypertension, Diabetes   PRECAUTIONS: None  RED FLAGS: H/o transverse myelitis   WEIGHT BEARING RESTRICTIONS: No  PAIN:  Are you having pain? Yes: NPRS scale: 3/10 Pain location: L shoulder, upper  back Pain description: discomfort Aggravating factors: increased reaching, force Relieving factors: limits movement, very guarded  FALLS: Has patient fallen in last 6 months? Yes. 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  PATIENT GOALS: to be pain free and gain as much ROM as I can with my L arm  NEXT MD VISIT: none scheduled  OBJECTIVE:   HAND DOMINANCE: Right  ADLs: Overall ADLs: reports adjusting to "life as the way it is" Grooming: difficulty combing hair, frequently utilizes only R hand due to decreased ROM in LUE UB Dressing: has adapted, reports husband will assist when needed but that she has adapted LB Dressing: leans against the wall or the bed and then  Bathing: uses long handled brush to wash under L arm and wash back Equipment: Long handled sponge  FUNCTIONAL OUTCOME MEASURES: Quick Dash: 59.1% impairement  UPPER EXTREMITY ROM:     Active ROM Right eval Left eval Left  05/23/23  Shoulder flexion  42 70  Shoulder abduction  25 45  Shoulder adduction     Shoulder extension     Shoulder internal rotation  Able to elicit near full ROM   Shoulder external rotation  unable   Elbow flexion  100   Elbow extension  Full ROM   Wrist flexion     Wrist extension     Wrist ulnar deviation     Wrist radial deviation     Wrist pronation     Wrist supination     (Blank rows = not tested)   UPPER EXTREMITY MMT:     MMT Right eval Left eval  Shoulder flexion  2-  Shoulder abduction  2-  Shoulder adduction    Shoulder extension    Shoulder  internal rotation    Shoulder external rotation    Middle trapezius    Lower trapezius    Elbow flexion    Elbow extension    Wrist flexion    Wrist extension    Wrist ulnar deviation    Wrist radial deviation    Wrist pronation    Wrist supination    (Blank rows = not tested)  HAND FUNCTION: Grip strength: Right: 78 lbs; Left: 5 lbs  COORDINATION: Unable to assess due to increased pain with attempts at ROM   SENSATION: Reports intermittent numbness, tingling in finger tips; notes increased dropping of items    EDEMA: mild edema in forearm, hand, and into middle finger  COGNITION: Overall cognitive status: Within  functional limits for tasks assessed  OBSERVATIONS: Pt guarding all movements and reports increased onset of pain with internal/external rotation and attempts at shoulder flexion.   TODAY'S TREATMENT:                                                                                                                              DATE:  05/23/23: Recertification for additional time due to patient chose 1x/week and continues to benefit from therapy services.    Patient seen for pool therapy. Entered and exited the pool independently with stairs and one hand rail on right.  Left shoulder elevated and arm held stiffly at side upon entry.   Patient reports continued small improvements in range of motion and pain staying in 2-2.5/10 range.  Patient felt stiffer after no pool therapy last week.  She also reports that she overdid it and carried something she shouldn't have (case of water) and pain escalated to 5/10 requiring her to take pain medication yesterday.  Focus of today's session was posture, alignment and relaxation - shoulder, neck, upper back.  Encouraged use of breath to aide in movement in shoulder.  Worked with buoyancy assisted then resisted movement - slowly to prevent increase in pain.  Working to improve more sustained muscle contraction in pain free mid ranges.     Discussed plan for clinic OT and patient wishes to wait until aquatic therapy concludes - 1-2 more sessions.   Patient feels shoulder is improving.      03/21/23 Discussed aquatic therapy benefits, followed by in-clinic therapy as pt with increased ROM and decreased pain.    PATIENT EDUCATION: Education details: Educated on role and purpose of OT as well as potential interventions and goals for therapy based on initial evaluation findings. Person educated: Patient Education method: Explanation Education comprehension: verbalized understanding  HOME EXERCISE PROGRAM: TBD  GOALS: Goals reviewed with patient? Yes  SHORT TERM GOALS: Target date: 04/22/23  Pt will be independent and compliant with aquatic therapy HEP. Baseline: Goal status: progressing  2.  Pt will demonstrate improvements in L shoulder flexion to >/= to 80 degrees  AROM to improve participation in UB dressing tasks Baseline:  Goal status: Met  3.  Pt will demonstrate at least 30 degrees of L shoulder ER AROM to improve ability to complete self-care activities.  Baseline:  Goal status: met  4.  Pt will report pain at worst rated as </= 5/10 to improve her sleep quality.  Baseline:  Goal status: Met  LONG TERM GOALS: Target date: 05/20/23  Patient will demonstrate at least 4/5 L shoulder strength to improve ability to lift and carry objects  Baseline:  Goal status: Progressing  2.  Pt will demonstrate improvements in functional use of L UE as evidenced by improved score on QuickDASH to < 45%. Baseline: 59.1% Goal status: Ongoing  3.  Pt will demonstrate improvements in L shoulder flexion to >/= to 120 degrees  AROM to  improve participation in UB dressing tasks Baseline:  Goal status: Progressing- has 120 in gravity assisted or gravity eliminated conditions.  4.  Pt will demonstrate at least 70 degrees of L shoulder ER AROM to improve ability to complete self-care activities.  Baseline:  Goal status:  Progressing   ASSESSMENT:  CLINICAL IMPRESSION: Patient feels relief of symptoms in water, but still has to gauge position and amount of resistance due to persistent pain.  Patient has made significant improvement in active range of motion available although still most limited in extension and external rotation at [facility] joint.    PERFORMANCE DEFICITS: in functional skills including ADLs, IADLs, coordination, edema, ROM, strength, pain, Fine motor control, Gross motor control, mobility, body mechanics, endurance, and UE functional use.  IMPAIRMENTS: are limiting patient from ADLs, IADLs, rest and sleep, work, leisure, and social participation.   COMORBIDITIES: may have co-morbidities  that affects occupational performance. Patient will benefit from skilled OT to address above impairments and improve overall function.  MODIFICATION OR ASSISTANCE TO COMPLETE EVALUATION: Min-Moderate modification of tasks or assist with assess necessary to complete an evaluation.  OT OCCUPATIONAL PROFILE AND HISTORY: Detailed assessment: Review of records and additional review of physical, cognitive, psychosocial history related to current functional performance.  CLINICAL DECISION MAKING: Moderate - several treatment options, min-mod task modification necessary  REHAB POTENTIAL: Good  EVALUATION COMPLEXITY: Moderate      PLAN:  OT FREQUENCY: 1-2x/week (start with 1x/week with aquatic and increase as pain/ROM improve)  OT DURATION: 8 weeks  PLANNED INTERVENTIONS: self care/ADL training, therapeutic exercise, therapeutic activity, neuromuscular re-education, manual therapy, passive range of motion, aquatic therapy, ultrasound, iontophoresis, compression bandaging, moist heat, cryotherapy, patient/family education, energy conservation, coping strategies training, DME and/or AE instructions, and Dry needling  RECOMMENDED OTHER SERVICES: aquatic therapy  CONSULTED AND AGREED WITH PLAN OF CARE:  Patient  PLAN FOR NEXT SESSION: continue HEP in aquatic therapy, once pain and ROM improving pt may benefit from sessions in clinic to facilitate increased focus on coordination and further functional tasks.   Collier Salina, OTR/L 05/23/2023, 5:32 PM

## 2023-05-30 ENCOUNTER — Encounter: Payer: Self-pay | Admitting: Occupational Therapy

## 2023-05-30 ENCOUNTER — Ambulatory Visit: Payer: BC Managed Care – PPO | Admitting: Occupational Therapy

## 2023-05-30 DIAGNOSIS — M6281 Muscle weakness (generalized): Secondary | ICD-10-CM

## 2023-05-30 DIAGNOSIS — M25512 Pain in left shoulder: Secondary | ICD-10-CM | POA: Diagnosis not present

## 2023-05-30 DIAGNOSIS — R600 Localized edema: Secondary | ICD-10-CM

## 2023-05-30 DIAGNOSIS — G8929 Other chronic pain: Secondary | ICD-10-CM

## 2023-05-30 NOTE — Therapy (Signed)
OUTPATIENT OCCUPATIONAL THERAPY ORTHO EVALUATION  Patient Name: [name] MRN: [id number] DOB:Sep 24, 1970, 52 y.o., female Today's Date: 05/30/2023  PCP: Madelin Headings, MD REFERRING PROVIDER: Richardean Sale, DO  END OF SESSION:  OT End of Session - 05/30/23 1838     Visit Number 7    Number of Visits 13    Date for OT Re-Evaluation 07/20/23    Authorization Type BC[employer]    OT Start Time 1420    OT Stop Time 1500    OT Time Calculation (min) 40 min    Equipment Utilized During Treatment floatation equipment    Activity Tolerance Patient tolerated treatment well    Behavior During Therapy WFL for tasks assessed/performed             Past Medical History:  Diagnosis Date   Diabetes mellitus without complication (HCC)    Family history of breast cancer    Family history of colon cancer    Hypertension    Hypoglycemia    Radiation 10/21/2015-10/23/2015   Anterior neck area 12 gray in 3 fractions   Sleep apnea    Teratoma    Transverse myelitis (HCC)    Past Surgical History:  Procedure Laterality Date   ABDOMINAL HERNIA REPAIR     ABDOMINAL HYSTERECTOMY     CESAREAN SECTION     CRYOTHERAPY  2013   to neck   DERMOID CYST  EXCISION     ECTOPIC PREGNANCY SURGERY     EXCISION MASS NECK Bilateral 10/20/2015   Procedure: EXCISION OF LARGE KELOIDS SEVERE RIGHT AND LEFT NECK WITH PLASTIC RECONSTRUCTION;  Surgeon: Louisa Second, MD;  Location: [facility];  Service: Plastics;  Laterality: Bilateral;   HAND SURGERY     amputation of right index finger   Patient Active Problem List   Diagnosis Date Noted   Type 2 diabetes mellitus with hyperglycemia (HCC) 09/06/2022   Genetic testing 12/17/2020   Family history of breast cancer    Family history of colon cancer    Cervical spinal stenosis 11/19/2019   Gait disturbance 05/21/2019   Transverse myelitis (HCC) 03/13/2019   Numbness 02/13/2019   Right leg weakness 02/13/2019   Lower extremity  weakness 01/16/2019   Other incomplete lesion at T10 level of thoracic spinal cord, initial encounter (HCC) 01/16/2019   Pneumonia due to COVID-19 virus 01/16/2019   Notalgia 08/10/2017   Morbid obesity (HCC) 02/18/2017   OSA (obstructive sleep apnea) 01/24/2017   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   Snoring 10/22/2016   Nocturnal dyspnea/choking   10/22/2016   Keloid of skin 09/25/2015   Essential hypertension, benign 09/27/2014   Teratoma 08/07/2014   Severe hypertension 06/08/2013   Dyslipidemia 07/23/2009   SHORTNESS OF BREATH 02/11/2009   HYPERTENSION 05/07/2008   KELOID SCAR 05/07/2008   HEADACHE 05/22/2007    ONSET DATE: referral 02/28/23  REFERRING DIAG: C16.606,T01.60 (ICD-10-CM) - Chronic left shoulder pain M75.52 (ICD-10-CM) - Subacromial bursitis of left shoulder joint  THERAPY DIAG:  Chronic left shoulder pain  Muscle weakness (generalized)  Localized edema  Rationale for Evaluation and Treatment: Rehabilitation  SUBJECTIVE:   SUBJECTIVE STATEMENT: "I can't raise my arm." Pt reports, "they gave me a steroid shot and I was able to do 3-4 sessions with PT but was unable to progress due to too much pain."  Pt reports increased pain starting back in December 2023 and thinks the only thing that could have changed was that she had started using the to battle  ropes in the gym.  Pt reports making modifications to her daily routine to allow for continued engagement in ADLs and work tasks.  Pt reports modifications to dressing routine, and even closing the car door with R arm and frequently will rest L arm in supination on thigh to aid in managing steering wheel.   Pt accompanied by: self  PERTINENT HISTORY: Radiation anterior neck 2017, Transverse myelitis, Hypertension, Diabetes   PRECAUTIONS: None  RED FLAGS: H/o transverse myelitis   WEIGHT BEARING RESTRICTIONS: No  PAIN:  Are you having pain? Yes: NPRS scale: 3/10 Pain location: L shoulder, upper  back Pain description: discomfort Aggravating factors: increased reaching, force Relieving factors: limits movement, very guarded  FALLS: Has patient fallen in last 6 months? Yes. 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  PATIENT GOALS: to be pain free and gain as much ROM as I can with my L arm  NEXT MD VISIT: none scheduled  OBJECTIVE:   HAND DOMINANCE: Right  ADLs: Overall ADLs: reports adjusting to "life as the way it is" Grooming: difficulty combing hair, frequently utilizes only R hand due to decreased ROM in LUE UB Dressing: has adapted, reports husband will assist when needed but that she has adapted LB Dressing: leans against the wall or the bed and then  Bathing: uses long handled brush to wash under L arm and wash back Equipment: Long handled sponge  FUNCTIONAL OUTCOME MEASURES: Quick Dash: 59.1% impairement  UPPER EXTREMITY ROM:     Active ROM Right eval Left eval Left  05/23/23  Shoulder flexion  42 70  Shoulder abduction  25 45  Shoulder adduction     Shoulder extension     Shoulder internal rotation  Able to elicit near full ROM   Shoulder external rotation  unable   Elbow flexion  100   Elbow extension  Full ROM   Wrist flexion     Wrist extension     Wrist ulnar deviation     Wrist radial deviation     Wrist pronation     Wrist supination     (Blank rows = not tested)   UPPER EXTREMITY MMT:     MMT Right eval Left eval  Shoulder flexion  2-  Shoulder abduction  2-  Shoulder adduction    Shoulder extension    Shoulder  internal rotation    Shoulder external rotation    Middle trapezius    Lower trapezius    Elbow flexion    Elbow extension    Wrist flexion    Wrist extension    Wrist ulnar deviation    Wrist radial deviation    Wrist pronation    Wrist supination    (Blank rows = not tested)  HAND FUNCTION: Grip strength: Right: 78 lbs; Left: 5 lbs  COORDINATION: Unable to assess due to increased pain with attempts at ROM   SENSATION: Reports intermittent numbness, tingling in finger tips; notes increased dropping of items    EDEMA: mild edema in forearm, hand, and into middle finger  COGNITION: Overall cognitive status: Within  functional limits for tasks assessed  OBSERVATIONS: Pt guarding all movements and reports increased onset of pain with internal/external rotation and attempts at shoulder flexion.   TODAY'S TREATMENT:                                                                                                                              DATE:  05/30/23:  Patient seen for pool therapy. Entered and exited the pool independently with stairs and one hand rail on right.  Patient reports continued small improvements in range of motion and pain staying in 2/10 range.    Focus of today's session was relaxation - shoulder, neck, upper back.  Encouraged use of breath to aide in movement in shoulder.  Worked with buoyancy assisted then resisted movement - slowly to prevent increase in pain.  Working to improve more sustained muscle contraction in pain free mid ranges.   Worked   wait until aquatic therapy concludes - 1-2 more sessions.   Patient feels shoulder is improving.      03/21/23 Discussed aquatic therapy benefits, followed by in-clinic therapy as pt with increased ROM and decreased pain.    PATIENT EDUCATION: Education details: Educated on role and purpose of OT as well as potential interventions and goals for therapy based on initial evaluation findings. Person educated:  Patient Education method: Explanation Education comprehension: verbalized understanding  HOME EXERCISE PROGRAM: TBD  GOALS: Goals reviewed with patient? Yes  SHORT TERM GOALS: Target date: 04/22/23  Pt will be independent and compliant with aquatic therapy HEP. Baseline: Goal status: progressing  2.  Pt will demonstrate improvements in L shoulder flexion to >/= to 80 degrees  AROM to improve participation in UB dressing tasks Baseline:  Goal status: Met  3.  Pt will demonstrate at least 30 degrees of L shoulder ER AROM to improve ability to complete self-care activities.  Baseline:  Goal status: met  4.  Pt will report pain at worst rated as </= 5/10 to improve her sleep quality.  Baseline:  Goal status: Met  LONG TERM GOALS: Target date: 05/20/23  Patient will demonstrate at least 4/5 L shoulder strength to improve ability to lift and carry objects  Baseline:  Goal status: Progressing  2.  Pt will demonstrate improvements in functional use of L UE as evidenced by improved score on QuickDASH to < 45%. Baseline: 59.1% Goal status: Ongoing  3.  Pt will demonstrate improvements in L shoulder flexion to >/= to 120 degrees  AROM to improve participation in UB dressing tasks Baseline:  Goal status: Progressing- has 120 in gravity assisted or gravity eliminated conditions.  4.  Pt will demonstrate at least 70 degrees of L shoulder ER AROM to improve ability to complete self-care activities.  Baseline:  Goal status: Progressing   ASSESSMENT:  CLINICAL IMPRESSION: Patient feels relief of symptoms in water, but still has to gauge position and amount of resistance due to persistent pain.  Patient has  made significant improvement in active range of motion available although still most limited in extension and external rotation at [facility] joint.    PERFORMANCE DEFICITS: in functional skills including ADLs, IADLs, coordination, edema, ROM, strength, pain, Fine motor control, Gross motor  control, mobility, body mechanics, endurance, and UE functional use.  IMPAIRMENTS: are limiting patient from ADLs, IADLs, rest and sleep, work, leisure, and social participation.   COMORBIDITIES: may have co-morbidities  that affects occupational performance. Patient will benefit from skilled OT to address above impairments and improve overall function.  MODIFICATION OR ASSISTANCE TO COMPLETE EVALUATION: Min-Moderate modification of tasks or assist with assess necessary to complete an evaluation.  OT OCCUPATIONAL PROFILE AND HISTORY: Detailed assessment: Review of records and additional review of physical, cognitive, psychosocial history related to current functional performance.  CLINICAL DECISION MAKING: Moderate - several treatment options, min-mod task modification necessary  REHAB POTENTIAL: Good  EVALUATION COMPLEXITY: Moderate      PLAN:  OT FREQUENCY: 1-2x/week (start with 1x/week with aquatic and increase as pain/ROM improve)  OT DURATION: 8 weeks  PLANNED INTERVENTIONS: self care/ADL training, therapeutic exercise, therapeutic activity, neuromuscular re-education, manual therapy, passive range of motion, aquatic therapy, ultrasound, iontophoresis, compression bandaging, moist heat, cryotherapy, patient/family education, energy conservation, coping strategies training, DME and/or AE instructions, and Dry needling  RECOMMENDED OTHER SERVICES: aquatic therapy  CONSULTED AND AGREED WITH PLAN OF CARE: Patient  PLAN FOR NEXT SESSION:  pt may benefit from sessions in clinic to facilitate increased focus on coordination and further functional tasks.   Collier Salina, OTR/L 05/30/2023, 6:40 PM

## 2023-09-18 ENCOUNTER — Other Ambulatory Visit: Payer: Self-pay | Admitting: Family Medicine

## 2024-01-01 ENCOUNTER — Other Ambulatory Visit: Payer: Self-pay | Admitting: Family Medicine

## 2024-05-25 ENCOUNTER — Other Ambulatory Visit: Payer: Self-pay | Admitting: Family Medicine

## 2024-05-25 ENCOUNTER — Ambulatory Visit: Payer: Self-pay

## 2024-05-25 NOTE — Telephone Encounter (Signed)
 Patient calling while driving back to [place]. Patient reports she was out of town and ended up being sent to the ED for toxic inhalation. Patient states she developed shortness of breath and throat tightening after a worker spray aerosol room refresh. Patient was sent to the ED-XR checked and given PO prednisone , benadryl and pepcid . Patient was instructed to follow up with her PCP. Patient requesting if possible to be seen today in PCP office. Patient states she is feeling better but would like to be evaluated in the office. Explained no availability in the office or surrounding clinics. Patient would like to see if office can bring her in today. Patient would like a call back today with an answer.   FYI Only or Action Required?: Action required by provider: request for appointment.  Patient was last seen in primary care on 04/25/2023 by Theresa Garnette LABOR, MD.  Called Nurse Triage reporting Shortness of Breath.  Symptoms began yesterday.  Interventions attempted: Prescription medications: Prednisone , Benadryl, Pepcid .  Symptoms are: gradually improving.  Triage Disposition: See HCP Within 4 Hours (Or PCP Triage)  Patient/caregiver understands and will follow disposition?: No, wishes to speak with PCP  Copied from CRM #[id number]. Topic: Clinical - Red Word Triage >> May 25, 2024 10:38 AM [name] wrote: Red Word that prompted transfer to Nurse Triage: Patient has shortness of breath and dizziness. She went to the ER yesterday and was diagnosed with Toxic Inhalation.   ---------------------------------------------------------------------- From previous Reason for Contact - Scheduling: Patient/patient representative is calling to schedule an appointment. Refer to attachments for appointment information. Reason for Disposition  [1] MILD difficulty breathing (e.g., minimal/no SOB at rest, SOB with walking, pulse < 100) AND [2] NEW-onset or WORSE than normal  Answer Assessment - Initial  Assessment Questions 1. RESPIRATORY STATUS: Describe your breathing? (e.g., wheezing, shortness of breath, unable to speak, severe coughing)      Shortness of breath 2. ONSET: When did this breathing problem begin?      Started yesterday after being exposed to toxic inhalation 3. PATTERN Does the difficult breathing come and go, or has it been constant since it started?      constant 4. SEVERITY: How bad is your breathing? (e.g., mild, moderate, severe)      mild 5. RECURRENT SYMPTOM: Have you had difficulty breathing before? If Yes, ask: When was the last time? and What happened that time?      Yes occurred yesterday 6. CARDIAC HISTORY: Do you have any history of heart disease? (e.g., heart attack, angina, bypass surgery, angioplasty)      no 7. LUNG HISTORY: Do you have any history of lung disease?  (e.g., pulmonary embolus, asthma, emphysema)     no 8. CAUSE: What do you think is causing the breathing problem?      Exposure to toxic inhalation 9. OTHER SYMPTOMS: Do you have any other symptoms? (e.g., chest pain, cough, dizziness, fever, runny nose)     Slight dizziness 10. O2 SATURATION MONITOR:  Do you use an oxygen saturation monitor (pulse oximeter) at home? If Yes, ask: What is your reading (oxygen level) today? What is your usual oxygen saturation reading? (e.g., 95%)       N/A 12. TRAVEL: Have you traveled out of the country in the last month? (e.g., travel history, exposures)       no  Protocols used: Breathing Difficulty-A-AH

## 2024-05-25 NOTE — Telephone Encounter (Signed)
 Follow up with pt. Inform pt her pcp and other providers have no available slot today. Best option is to go to UC. Pt verbalized understanding.

## 2024-05-29 NOTE — Telephone Encounter (Signed)
 Last visit for cdm DR burchette 2024  Over due for monitoring of all .  Ok to refill  90 days  no refills and will fu at upcoming visit

## 2024-06-13 ENCOUNTER — Encounter: Payer: Self-pay | Admitting: Internal Medicine

## 2024-06-13 ENCOUNTER — Ambulatory Visit: Payer: Self-pay | Admitting: Internal Medicine

## 2024-06-13 ENCOUNTER — Ambulatory Visit: Admitting: Internal Medicine

## 2024-06-13 ENCOUNTER — Other Ambulatory Visit (HOSPITAL_COMMUNITY): Payer: Self-pay

## 2024-06-13 VITALS — BP 100/70 | HR 106 | Temp 98.0°F | Ht 73.0 in | Wt 232.8 lb

## 2024-06-13 DIAGNOSIS — G373 Acute transverse myelitis in demyelinating disease of central nervous system: Secondary | ICD-10-CM

## 2024-06-13 DIAGNOSIS — Z794 Long term (current) use of insulin: Secondary | ICD-10-CM

## 2024-06-13 DIAGNOSIS — Z872 Personal history of diseases of the skin and subcutaneous tissue: Secondary | ICD-10-CM

## 2024-06-13 DIAGNOSIS — E1165 Type 2 diabetes mellitus with hyperglycemia: Secondary | ICD-10-CM

## 2024-06-13 DIAGNOSIS — Z79899 Other long term (current) drug therapy: Secondary | ICD-10-CM

## 2024-06-13 DIAGNOSIS — I10 Essential (primary) hypertension: Secondary | ICD-10-CM | POA: Diagnosis not present

## 2024-06-13 DIAGNOSIS — E785 Hyperlipidemia, unspecified: Secondary | ICD-10-CM

## 2024-06-13 LAB — CBC WITH DIFFERENTIAL/PLATELET
Basophils Absolute: 0 K/uL (ref 0.0–0.1)
Basophils Relative: 0.4 % (ref 0.0–3.0)
Eosinophils Absolute: 0.1 K/uL (ref 0.0–0.7)
Eosinophils Relative: 1 % (ref 0.0–5.0)
HCT: 43.2 % (ref 36.0–46.0)
Hemoglobin: 14.8 g/dL (ref 12.0–15.0)
Lymphocytes Relative: 30.3 % (ref 12.0–46.0)
Lymphs Abs: 2.8 K/uL (ref 0.7–4.0)
MCHC: 34.2 g/dL (ref 30.0–36.0)
MCV: 89.6 fl (ref 78.0–100.0)
Monocytes Absolute: 0.6 K/uL (ref 0.1–1.0)
Monocytes Relative: 6 % (ref 3.0–12.0)
Neutro Abs: 5.8 K/uL (ref 1.4–7.7)
Neutrophils Relative %: 62.3 % (ref 43.0–77.0)
Platelets: 283 K/uL (ref 150.0–400.0)
RBC: 4.82 Mil/uL (ref 3.87–5.11)
RDW: 13.3 % (ref 11.5–15.5)
WBC: 9.3 K/uL (ref 4.0–10.5)

## 2024-06-13 LAB — HEPATIC FUNCTION PANEL
ALT: 21 U/L (ref 0–35)
AST: 17 U/L (ref 0–37)
Albumin: 4.6 g/dL (ref 3.5–5.2)
Alkaline Phosphatase: 134 U/L — ABNORMAL HIGH (ref 39–117)
Bilirubin, Direct: 0.2 mg/dL (ref 0.0–0.3)
Total Bilirubin: 0.9 mg/dL (ref 0.2–1.2)
Total Protein: 8.2 g/dL (ref 6.0–8.3)

## 2024-06-13 LAB — TSH: TSH: 1.7 u[IU]/mL (ref 0.35–5.50)

## 2024-06-13 LAB — LIPID PANEL
Cholesterol: 273 mg/dL — ABNORMAL HIGH (ref 0–200)
HDL: 42.6 mg/dL (ref >39.00)
LDL Cholesterol: 182 mg/dL — ABNORMAL HIGH (ref 0–99)
NonHDL: 230.64
Total CHOL/HDL Ratio: 6
Triglycerides: 242 mg/dL — ABNORMAL HIGH (ref 0.0–149.0)
VLDL: 48.4 mg/dL — ABNORMAL HIGH (ref 0.0–40.0)

## 2024-06-13 LAB — MICROALBUMIN / CREATININE URINE RATIO
Creatinine,U: 51.2 mg/dL
Microalb Creat Ratio: UNDETERMINED mg/g (ref 0.0–30.0)
Microalb, Ur: <0.7 mg/dL

## 2024-06-13 LAB — BASIC METABOLIC PANEL WITH GFR
BUN: 17 mg/dL (ref 6–23)
CO2: 31 meq/L (ref 19–32)
Calcium: 10.2 mg/dL (ref 8.4–10.5)
Chloride: 91 meq/L — ABNORMAL LOW (ref 96–112)
Creatinine, Ser: 1.04 mg/dL (ref 0.40–1.20)
GFR: 61.15 mL/min (ref >60.00)
Glucose, Bld: 487 mg/dL — ABNORMAL HIGH (ref 70–99)
Potassium: 3.9 meq/L (ref 3.5–5.1)
Sodium: 132 meq/L — ABNORMAL LOW (ref 135–145)

## 2024-06-13 LAB — POCT GLYCOSYLATED HEMOGLOBIN (HGB A1C): Hemoglobin A1C: 12.4 % — AB (ref 4.0–5.6)

## 2024-06-13 MED ORDER — FREESTYLE LIBRE 3 PLUS SENSOR MISC: 12 refills | Status: DC

## 2024-06-13 NOTE — Progress Notes (Signed)
 As expected  glucose very high  almost 500!  Ramp up insulin  to 30 units per day . For now Kidney function is acceptable. Ok   Thyroid   and blood count normal  Cholesterol panel high  and will need to be addressed in fu  Goal  bg in am 120 and below   I have placed a referral to  endocrinology   consider mealtime insulin    if needed  . GLP 1 in future  as discussed  If feeling very ill seek ed care .  Karpuih  please send in rx for glucometer event though gave her free style cgm   Send in readings  of BG  twice a day in next week

## 2024-06-13 NOTE — Progress Notes (Signed)
 Chief Complaint  Patient presents with   Medical Management of Chronic Issues   Extremity Weakness    Pt c/o R leg weakness since covid. Tingling, burning, numbess. Was seeing Dr. Suanne    Shortness of Breath    HPI: [name] 53 y.o. come in for vist last seen  by me over 3 years ago .  Had seen neuro for fu of Remote hx of transverse myelitis.  May be from covid   still has some weak feeling in legs not sure if could be from diabetes other.   DM not being followed   Cbg  Machine  broken  travels with work  taking 10 U of insulin   dose ;have polyuria polydipsia  vision   changed .  Not acutely ill but not feel optimally well    Ht: taking  med daily   No other  meds ? Had  resp response allergic to inhaant  see ed visit  ok nowexcept feels mucousy  in throat.  No hives  wheezing  ROS: See pertinent positives and negatives per HPI. No current cp  fever .  Falling  infection still gets  keloids  easily  no new gi issues   Past Medical History:  Diagnosis Date   Diabetes mellitus without complication (HCC)    Family history of breast cancer    Family history of colon cancer    Hypertension    Hypoglycemia    Radiation 10/21/2015-10/23/2015   Anterior neck area 12 gray in 3 fractions   Sleep apnea    Teratoma    Transverse myelitis (HCC)     Family History  Problem Relation Age of Onset   Hypertension Mother    Diabetes Mother    Diabetes Father    Thyroid  disease Father    Heart failure Father    Breast cancer Maternal Aunt        dx 40?   Colon cancer Maternal Grandmother 72   Hypertension Other    Colon polyps Neg Hx    Esophageal cancer Neg Hx    Rectal cancer Neg Hx    Stomach cancer Neg Hx     Social History   Socioeconomic History   Marital status: Married    Spouse name: Not on file   Number of children: 2   Years of education: Not on file   Highest education level: Not on file  Occupational History   Occupation: occupational hygienist  Tobacco  Use   Smoking status: Some Days    Types: Cigars   Smokeless tobacco: Never  Vaping Use   Vaping status: Never Used  Substance and Sexual Activity   Alcohol use: Yes    Alcohol/week: 1.0 standard drink of alcohol    Types: 1 Glasses of wine per week    Comment: socially   Drug use: No   Sexual activity: Yes    Birth control/protection: Surgical  Other Topics Concern   Not on file  Social History Narrative   Not on file   Social Drivers of Health   Financial Resource Strain: Not on file  Food Insecurity: Not on file  Transportation Needs: Not on file  Physical Activity: Not on file  Stress: Not on file  Social Connections: Not on file    Outpatient Medications Prior to Visit  Medication Sig Dispense Refill   Insulin  Glargine (BASAGLAR  KWIKPEN) 100 UNIT/ML INJECT 10 UNITS UNDER THE SKIN DAILLY, WITH PEN NEEDLES OR AS DIRECTED 15 mL  0   Misc Natural Products (BEET ROOT PO) Take by mouth daily.     Olmesartan -amLODIPine -HCTZ 40-5-25 MG TABS TAKE 1 TABLET BY MOUTH DAILY 90 tablet 3   vitamin C  (VITAMIN C ) 500 MG tablet Take 1 tablet (500 mg total) by mouth daily. Please take for 2 weeks     zinc  sulfate 220 (50 Zn) MG capsule Take 1 capsule (220 mg total) by mouth daily. Please take for 2 weeks     baclofen  (LIORESAL ) 10 MG tablet Take 1 tablet (10 mg total) by mouth daily as needed for muscle spasms. (Patient not taking: Reported on 06/13/2024) 90 each 4   celecoxib  (CELEBREX ) 200 MG capsule Take 1 capsule (200 mg total) by mouth 2 (two) times daily. (Patient not taking: Reported on 06/13/2024) 60 capsule 0   cyclobenzaprine  (FLEXERIL ) 5 MG tablet Take one po qd prn (Patient not taking: Reported on 06/13/2024) 30 tablet 0   fluticasone  (FLONASE ) 50 MCG/ACT nasal spray Place 2 sprays into both nostrils daily. (Patient not taking: Reported on 06/13/2024) 16 mL 0   meloxicam  (MOBIC ) 15 MG tablet Take 1 tablet (15 mg total) by mouth daily as needed for pain. (Patient not taking:  Reported on 06/13/2024) 30 tablet 0   metFORMIN  (GLUCOPHAGE ) 500 MG tablet TAKE 4 TABLETS BY MOUTH EVERY DAY Needs appointment for refills. (Patient not taking: Reported on 06/13/2024) 120 tablet 0   rosuvastatin  (CRESTOR ) 10 MG tablet Take 1 tablet (10 mg total) by mouth daily. (Patient not taking: Reported on 06/13/2024) 90 tablet 3   No facility-administered medications prior to visit.     EXAM:  BP 100/70 (BP Location: Left Arm, Patient Position: Sitting, Cuff Size: Large)   Pulse (!) 106   Temp 98 F (36.7 C) (Oral)   Ht 6' 1 (1.854 m)   Wt 232 lb 12.8 oz (105.6 kg)   SpO2 97%   BMI 30.71 kg/m   Body mass index is 30.71 kg/m.  GENERAL: vitals reviewed and listed above, alert, oriented, appears well hydrated and in no acute distress HEENT: atraumatic, conjunctiva  clear, no obvious abnormalities on inspection of external nose and ears NECK: no obvious masses on inspection palpation  LUNGS: clear to auscultation bilaterally, no wheezes, rales or rhonchi, good air movement CV: HRRR, no clubbing cyanosis or  peripheral edema nl cap refill  MS: moves all extremities without noticeable focal  abnormality neuro non foramlly tested  PSYCH: pleasant and cooperative, no obvious depression or anxiety Skin hx of many keloids  Lab Results  Component Value Date   WBC 9.3 06/13/2024   HGB 14.8 06/13/2024   HCT 43.2 06/13/2024   PLT 283.0 06/13/2024   GLUCOSE 487 (H) 06/13/2024   CHOL 273 (H) 06/13/2024   TRIG 242.0 (H) 06/13/2024   HDL 42.60 06/13/2024   LDLDIRECT 142.0 06/08/2021   LDLCALC 182 (H) 06/13/2024   ALT 21 06/13/2024   AST 17 06/13/2024   NA 132 (L) 06/13/2024   K 3.9 06/13/2024   CL 91 (L) 06/13/2024   CREATININE 1.04 06/13/2024   BUN 17 06/13/2024   CO2 31 06/13/2024   TSH 1.70 06/13/2024   HGBA1C 12.4 (A) 06/13/2024   MICROALBUR <0.7 06/13/2024   BP Readings from Last 3 Encounters:  06/13/24 100/70  04/25/23 100/76  02/28/23 130/84    ASSESSMENT AND  PLAN:  Discussed the following assessment and plan:  Type 2 diabetes mellitus with hyperglycemia, with long-term current use of insulin  (HCC) - uncontrolled for a while  will benefit from aggressive  intervnetion and fu other meds - Plan: POC HgB A1c, Basic metabolic panel with GFR, Hepatic function panel, CBC with Differential/Platelet, TSH, Lipid panel, Microalbumin / creatinine urine ratio, Ambulatory referral to Endocrinology  Essential hypertension, benign - Plan: Basic metabolic panel with GFR, Hepatic function panel, CBC with Differential/Platelet, TSH, Lipid panel  Dyslipidemia - Plan: Basic metabolic panel with GFR, Hepatic function panel, CBC with Differential/Platelet, TSH, Lipid panel  Medication management - Plan: Ambulatory referral to Endocrinology  History of keloid of skin  Transverse myelitis (HCC) his of Disc  importance of getting sugar under control for metabolic  reasons and risk   She is interested I glp1 but advise we get her glucose in better range and see endocrine and go from there   Lack of fu:  job  takes her travel and I as pcp am in office 1/2 time  is part of issue  but hasn't had appt  for a long time.  Disc  seeing other providers when I am  not here and can do virtual visits  that may help   She agrees  an wants to get in with  endocrinology  also .  Will do referral .  Sample of free style libre 3 = and copay card   and rx sent in to pharmacy .  Update lab today  .  Increase insulin    15 20 30  . For now . Want fasting fb 120 or less range eventually   -Patient advised to return or notify health care team  if  new concerns arise.  Patient Instructions  Getting blood sugar under control. Is important as we discussed and may feel a lot better  Get an eye check .   Increase insulin  to 15 units and then 20 units may need  30 or more in the short run  May  certainly add other meds as indicated  No sugar in beverages  Try CGM   free style libre . Will  refer to  endocrinology  I interim   Plan virtual visit  FU about  4 weeks    Apolinar POUR. Nikia Mangino M.D.

## 2024-06-13 NOTE — Patient Instructions (Addendum)
 Getting blood sugar under control. Is important as we discussed and may feel a lot better  Get an eye check .   Increase insulin  to 15 units and then 20 units may need  30 or more in the short run  May  certainly add other meds as indicated  No sugar in beverages  Try CGM   free style libre . Will refer to  endocrinology  I interim   Plan virtual visit  FU about  4 weeks

## 2024-06-14 ENCOUNTER — Telehealth: Payer: Self-pay

## 2024-06-14 ENCOUNTER — Other Ambulatory Visit (HOSPITAL_COMMUNITY): Payer: Self-pay

## 2024-06-14 DIAGNOSIS — Z794 Long term (current) use of insulin: Secondary | ICD-10-CM

## 2024-06-14 MED ORDER — LANCET DEVICE MISC: 1.0000 | 0 refills | Status: AC

## 2024-06-14 MED ORDER — BLOOD GLUCOSE TEST VI STRP: 1.0000 | ORAL_STRIP | 0 refills | Status: DC

## 2024-06-14 MED ORDER — LANCETS MISC: 1.0000 | 0 refills | Status: DC

## 2024-06-14 MED ORDER — BLOOD GLUCOSE MONITORING SUPPL DEVI: 1.0000 | 0 refills | Status: AC

## 2024-06-14 NOTE — Telephone Encounter (Signed)
 Pharmacy Patient Advocate Encounter   Received notification from Onbase that prior authorization for [facility] 3 plus sensor is required/requested.   Insurance verification completed.   The patient is insured through [facility].   One month supply of Freestyle Libre 3 plus sensors is $74.99. Patient insurance prefers Dexcom G7 sensors, one month supply is $50.00

## 2024-06-15 ENCOUNTER — Other Ambulatory Visit: Payer: Self-pay | Admitting: Internal Medicine

## 2024-06-19 NOTE — Telephone Encounter (Signed)
 So it  seems that  dexcom is covered at 50 $ and may be less expensive for refill.  Certainly can refer for nutrition. But make a comment needs virtual as possible cause of her work travel.  Dx diabetes out of control   What are blood  glucose readings  doing ? since last ones were over 400 ?  Need to get  sugar levels down   to under 200  range  We can adjust medication but need  information. [name]

## 2024-06-19 NOTE — Telephone Encounter (Signed)
 Attempted to follow up with pt. Unsuccessful. Will try again.

## 2024-06-20 NOTE — Telephone Encounter (Signed)
 Attempted to reach pt. Left a voicemail to call us back.

## 2024-06-21 ENCOUNTER — Other Ambulatory Visit (HOSPITAL_COMMUNITY): Payer: Self-pay

## 2024-06-21 ENCOUNTER — Telehealth: Payer: Self-pay

## 2024-06-21 NOTE — Telephone Encounter (Signed)
 Pharmacy Patient Advocate Encounter   Received notification from Onbase that prior authorization for Basaglar  Kwikpen 100 is required/requested.   Insurance verification completed.   The patient is insured through [facility].   Per test claim: PA required; PA started via CoverMyMeds. KEY [id number] . Waiting for clinical questions to populate.

## 2024-06-21 NOTE — Telephone Encounter (Signed)
 Clinical questions have been answered and PA submitted. PA currently Pending. Please be advised that most companies allow up to 30 days to make a decision. We will advise when a determination has been made, or follow up in 1 week.   Please reach out to our team, Rx Prior Auth Pool, if you haven't heard back in a week.

## 2024-06-22 ENCOUNTER — Other Ambulatory Visit (HOSPITAL_COMMUNITY): Payer: Self-pay

## 2024-06-26 ENCOUNTER — Telehealth: Payer: Self-pay | Admitting: Internal Medicine

## 2024-06-26 MED ORDER — DEXCOM G7 SENSOR MISC: 3 refills | Status: AC

## 2024-06-26 NOTE — Addendum Note (Signed)
 Addended by: Yvanna Vidas on: 06/26/2024 03:41 PM   Modules accepted: Orders

## 2024-06-26 NOTE — Telephone Encounter (Signed)
 Ok would increase insulin  by 2- units every 2 days,   until BG    levels under 200 mg

## 2024-06-26 NOTE — Telephone Encounter (Signed)
 Attempted to reach pt. Left a voicemail to call us back.

## 2024-06-26 NOTE — Telephone Encounter (Signed)
 Rx order is placed. Please help with PA.

## 2024-06-26 NOTE — Telephone Encounter (Signed)
 Copied from CRM [id number]. Topic: Clinical - Medication Question >> Jun 26, 2024  1:16 PM Mia F wrote: Reason for CRM: Pt called to inform office she installed the second CGM and within the next 2 weeks. She wants to call early because the last one she had slipped off of her arm. She wants whatever one the insurance company covers. Pt did not know of the name. She would like to send this to   [id number] - [place], [place] - [address] AT [facility] [address] [place] [address] Phone: [phone] Fax: [phone] Hours: Open 24 hours

## 2024-06-27 ENCOUNTER — Other Ambulatory Visit (HOSPITAL_COMMUNITY): Payer: Self-pay

## 2024-06-27 NOTE — Telephone Encounter (Signed)
 Pharmacy Patient Advocate Encounter  Received notification from [facility] that Prior Authorization for Basaglar  Kwikpen has been DENIED.  Full denial letter will be uploaded to the media tab. See denial reason below.    PA #/Case ID/Reference #: # [id number]

## 2024-06-28 ENCOUNTER — Telehealth: Payer: Self-pay | Admitting: Internal Medicine

## 2024-06-28 ENCOUNTER — Telehealth: Payer: Self-pay | Admitting: *Deleted

## 2024-06-28 NOTE — Telephone Encounter (Signed)
 Copied from CRM [id number]. Topic: General - Call Back - No Documentation >> Jun 28, 2024 12:14 PM Rea C wrote: Reason for CRM: Patient returned phone call to speak with Karpuih in regards to diabetes meds.  [phone] BENNIE) >> Jun 28, 2024 12:17 PM Leah C wrote: Can patient can receive call back before 1 o'clock while she's on lunch, otherwise she is off at 5.

## 2024-06-28 NOTE — Telephone Encounter (Signed)
 Copied from CRM [id number]. Topic: General - Other >> Jun 27, 2024  4:49 PM Nessti S wrote: Reason for CRM: pt returning call to nurse karrie. Call back number [phone]

## 2024-06-28 NOTE — Telephone Encounter (Signed)
 Attempted to reach pt. Left a voicemail to call us back.

## 2024-06-29 NOTE — Telephone Encounter (Signed)
 Spoke to pt yesterday. Pt reports her herlene is off by 5-6 compare to pricking blood glucose. Pt reports her bs range from 157- 243.  Pt had tried freeze her protein shake, unsweet starbuck- did spike during lunch then peanut butter help bring it down. Pt updated she is sleeping a little bit better.  Inform pt of provider message. And that she can split it as she is doing with 30 unit day into 15 each. This time 16 units every 2 days.Pt verbalized understanding and states she has enough insulin  at this time. Inform pt that pharmacy tech was still working on her dexcom. Pt verbalized ok and that she still has 13 days left on the freestyle libre.    FYI Forwarding to provider

## 2024-07-03 NOTE — Telephone Encounter (Signed)
 Follow up with pharmacy. They inform pt has pick it up on 07/01/2024. Insurance covers.

## 2024-07-11 ENCOUNTER — Telehealth (INDEPENDENT_AMBULATORY_CARE_PROVIDER_SITE_OTHER): Admitting: Internal Medicine

## 2024-07-11 ENCOUNTER — Encounter: Payer: Self-pay | Admitting: Internal Medicine

## 2024-07-11 ENCOUNTER — Telehealth: Payer: Self-pay

## 2024-07-11 VITALS — BP 130/80 | Ht 73.0 in | Wt 232.0 lb

## 2024-07-11 DIAGNOSIS — E1165 Type 2 diabetes mellitus with hyperglycemia: Secondary | ICD-10-CM | POA: Diagnosis not present

## 2024-07-11 DIAGNOSIS — E785 Hyperlipidemia, unspecified: Secondary | ICD-10-CM | POA: Diagnosis not present

## 2024-07-11 DIAGNOSIS — Z794 Long term (current) use of insulin: Secondary | ICD-10-CM | POA: Diagnosis not present

## 2024-07-11 DIAGNOSIS — Z79899 Other long term (current) drug therapy: Secondary | ICD-10-CM

## 2024-07-11 DIAGNOSIS — I10 Essential (primary) hypertension: Secondary | ICD-10-CM

## 2024-07-11 NOTE — Telephone Encounter (Signed)
 Sent pt mychart message

## 2024-07-11 NOTE — Progress Notes (Signed)
 Virtual Visit via Video Note  I connected with [name] on 07/11/24 at  3:30 PM EST by a video enabled telemedicine application and verified that I am speaking with the correct person using two identifiers. Location patient: vehicle  Location provider:work office Persons participating in the virtual visit: patient, provider   Patient aware  of the limitations of evaluation and management by telemedicine and  availability of in person appointments. and agreed to proceed.   HPI: [name] presents for video visit  fu out of control dm ht . Since last visit she has increased insulin  to 15 bid  Using cgm  says amn bg inlow 100s  lowest 109  still spike to 280  w certain foods  but no sugar drinks  and working on protein and avoiding processed carbs . Hasnt gotten endo appt yet   may have missed calls and will call for appt . Vision changing  Swelling r leg better left ankle still looks swollen some but no pain or leg swelling .residual for transverse myelitis  sometimes r foot drags  but no fall .  States bp is bettter 116/82 range    ROS: See pertinent positives and negatives per HPI.  Past Medical History:  Diagnosis Date   Diabetes mellitus without complication (HCC)    Family history of breast cancer    Family history of colon cancer    Hypertension    Hypoglycemia    Radiation 10/21/2015-10/23/2015   Anterior neck area 12 gray in 3 fractions   Sleep apnea    Teratoma    Transverse myelitis (HCC)     Past Surgical History:  Procedure Laterality Date   ABDOMINAL HERNIA REPAIR     ABDOMINAL HYSTERECTOMY     CESAREAN SECTION     CRYOTHERAPY  2013   to neck   DERMOID CYST  EXCISION     ECTOPIC PREGNANCY SURGERY     EXCISION MASS NECK Bilateral 10/20/2015   Procedure: EXCISION OF LARGE KELOIDS SEVERE RIGHT AND LEFT NECK WITH PLASTIC RECONSTRUCTION;  Surgeon: Elna Pick, MD;  Location: [facility];  Service: Plastics;  Laterality: Bilateral;    HAND SURGERY     amputation of right index finger    Family History  Problem Relation Age of Onset   Hypertension Mother    Diabetes Mother    Diabetes Father    Thyroid  disease Father    Heart failure Father    Breast cancer Maternal Aunt        dx 67?   Colon cancer Maternal Grandmother 72   Hypertension Other    Colon polyps Neg Hx    Esophageal cancer Neg Hx    Rectal cancer Neg Hx    Stomach cancer Neg Hx     Social History   Tobacco Use   Smoking status: Some Days    Types: Cigars   Smokeless tobacco: Never  Vaping Use   Vaping status: Never Used  Substance Use Topics   Alcohol use: Yes    Alcohol/week: 1.0 standard drink of alcohol    Types: 1 Glasses of wine per week    Comment: socially   Drug use: No      Current Outpatient Medications:    Blood Glucose Monitoring Suppl DEVI, 1 each by Does not apply route as directed. Dispense based on patient and insurance preference. Use up to four times daily as directed. (FOR ICD-10 E10.9, E11.9)., Disp: 1 each, Rfl: 0  Continuous Glucose Sensor (DEXCOM G7 SENSOR) MISC, Use as directed. Change every 10 days., Disp: 1 each, Rfl: 3   Glucose Blood (BLOOD GLUCOSE TEST STRIPS) STRP, 1 each by Does not apply route as directed. Dispense based on patient and insurance preference. Use up to four times daily as directed. (FOR ICD-10 E10.9, E11.9)., Disp: 100 strip, Rfl: 0   Insulin  Glargine (BASAGLAR  KWIKPEN) 100 UNIT/ML, INJECT 10 UNITS UNDER THE SKIN DAILY, WITH PEN NEEDLES OR AS DIRECTED, Disp: 15 mL, Rfl: 0   Lancet Device MISC, 1 each by Does not apply route as directed. Dispense based on patient and insurance preference. Use up to four times daily as directed. (FOR ICD-10 E10.9, E11.9)., Disp: 1 each, Rfl: 0   Lancets MISC, 1 each by Does not apply route as directed. Dispense based on patient and insurance preference. Use up to four times daily as directed. (FOR ICD-10 E10.9, E11.9)., Disp: 100 each, Rfl: 0   Misc Natural  Products (BEET ROOT PO), Take by mouth daily., Disp: , Rfl:    Olmesartan -amLODIPine -HCTZ 40-5-25 MG TABS, TAKE 1 TABLET BY MOUTH DAILY, Disp: 90 tablet, Rfl: 3   vitamin C  (VITAMIN C ) 500 MG tablet, Take 1 tablet (500 mg total) by mouth daily. Please take for 2 weeks, Disp: , Rfl:    zinc  sulfate 220 (50 Zn) MG capsule, Take 1 capsule (220 mg total) by mouth daily. Please take for 2 weeks, Disp: , Rfl:    baclofen  (LIORESAL ) 10 MG tablet, Take 1 tablet (10 mg total) by mouth daily as needed for muscle spasms. (Patient not taking: Reported on 07/11/2024), Disp: 90 each, Rfl: 4   celecoxib  (CELEBREX ) 200 MG capsule, Take 1 capsule (200 mg total) by mouth 2 (two) times daily. (Patient not taking: Reported on 07/11/2024), Disp: 60 capsule, Rfl: 0   cyclobenzaprine  (FLEXERIL ) 5 MG tablet, Take one po qd prn (Patient not taking: Reported on 07/11/2024), Disp: 30 tablet, Rfl: 0   fluticasone  (FLONASE ) 50 MCG/ACT nasal spray, Place 2 sprays into both nostrils daily. (Patient not taking: Reported on 07/11/2024), Disp: 16 mL, Rfl: 0   meloxicam  (MOBIC ) 15 MG tablet, Take 1 tablet (15 mg total) by mouth daily as needed for pain. (Patient not taking: Reported on 06/13/2024), Disp: 30 tablet, Rfl: 0   metFORMIN  (GLUCOPHAGE ) 500 MG tablet, TAKE 4 TABLETS BY MOUTH EVERY DAY Needs appointment for refills. (Patient not taking: Reported on 07/11/2024), Disp: 120 tablet, Rfl: 0   rosuvastatin  (CRESTOR ) 10 MG tablet, Take 1 tablet (10 mg total) by mouth daily. (Patient not taking: Reported on 07/11/2024), Disp: 90 tablet, Rfl: 3  EXAM: BP Readings from Last 3 Encounters:  07/11/24 130/80  06/13/24 100/70  04/25/23 100/76    VITALS per patient if applicable:  GENERAL: alert, oriented, appears well and in no acute distress  HEENT: atraumatic, conjunttiva clear, no obvious abnormalities on inspection of external nose and ears  NECK: normal movements of the head and neck  LUNGS: on inspection no signs of  respiratory distress, breathing rate appears normal, no obvious gross SOB, gasping or wheezing  CV: no obvious cyanosis  PSYCH/NEURO: pleasant and cooperative, no obvious depression or anxiety, speech and thought processing grossly intact Lab Results  Component Value Date   WBC 9.3 06/13/2024   HGB 14.8 06/13/2024   HCT 43.2 06/13/2024   PLT 283.0 06/13/2024   GLUCOSE 487 (H) 06/13/2024   CHOL 273 (H) 06/13/2024   TRIG 242.0 (H) 06/13/2024   HDL 42.60 06/13/2024  LDLDIRECT 142.0 06/08/2021   LDLCALC 182 (H) 06/13/2024   ALT 21 06/13/2024   AST 17 06/13/2024   NA 132 (L) 06/13/2024   K 3.9 06/13/2024   CL 91 (L) 06/13/2024   CREATININE 1.04 06/13/2024   BUN 17 06/13/2024   CO2 31 06/13/2024   TSH 1.70 06/13/2024   HGBA1C 12.4 (A) 06/13/2024   MICROALBUR <0.7 06/13/2024    ASSESSMENT AND PLAN:  Discussed the following assessment and plan:    ICD-10-CM   1. Type 2 diabetes mellitus with hyperglycemia, with long-term current use of insulin  (HCC)  E11.65 Basic metabolic panel with GFR   Z79.4 Lipid panel    Hemoglobin A1c    2. Essential hypertension, benign  I10 Basic metabolic panel with GFR    Lipid panel    Hemoglobin A1c    3. Dyslipidemia  E78.5 Basic metabolic panel with GFR    Lipid panel    Hemoglobin A1c    4. Medication management  Z79.899 Basic metabolic panel with GFR    Lipid panel    Hemoglobin A1c    Doing better on insulin  long acting  BID 15 and 15 and checking pp. If left ankle ongoing signs  make visit to check uncertain if clinically significant  She will reach out to endo office for appt time  and nutrition.  She has made many changes  in  eating and fluids doing uch better  most bg below 200 and some spikes  .  She will eventually get eye fu.  Bp is better also . Plan  labs in January if not seen yet by endo   To include bmp A1c and lipids  For now stay on insulin  15 bid ok :  many options such as meal time insulin  glp gip meds  etc.   Disc  goals  Counseled.   Expectant management and discussion of plan and treatment with opportunity to ask questions and all were answered. The patient agreed with the plan and demonstrated an understanding of the instructions.   Advised to call back or seek an in-person evaluation if worsening  or having  further concerns  in interim. Return for lab in january   fu pending depending on  when endo appt is .  Apolinar Eastern, MD

## 2024-08-01 ENCOUNTER — Other Ambulatory Visit: Payer: Self-pay

## 2024-08-01 MED ORDER — BASAGLAR KWIKPEN 100 UNIT/ML ~~LOC~~ SOPN: PEN_INJECTOR | SUBCUTANEOUS | 1 refills | Status: AC

## 2024-08-06 ENCOUNTER — Encounter: Admitting: Skilled Nursing Facility1

## 2024-08-06 ENCOUNTER — Encounter: Payer: Self-pay | Admitting: Skilled Nursing Facility1

## 2024-08-06 VITALS — Ht 73.0 in | Wt 244.2 lb

## 2024-08-06 DIAGNOSIS — E1165 Type 2 diabetes mellitus with hyperglycemia: Secondary | ICD-10-CM | POA: Diagnosis present

## 2024-08-06 NOTE — Progress Notes (Signed)
 Pt states she had to learn how to walk all over again in 2020 due to transverse myelitis.  Pt states the Holidays have got her off track a bit.  Pt states she has been discussing her journey with her husband for extra support.  Pt states they are trying to eat more often at home. Pt states she is a early bird but her husband works second shift. 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  Pt states she is a light sleeper and has aches and pains keeping her up at night .   DM medications: Insulin  15 units in the morning and 15 units in the evening   CGM Dexcom 14 days  TIR 19%; 55% high; 26% very high  GMI 8.7 Average glucose 224   Breakfast: Rye toast and egg and avocado OR protein shake: berries and powder and peanut butter and water  Snack: yogurt Lunch: half sandwich and handful of nuts  Snack: berries, nuts Dinner: eaten out Snack: popcorn   Beverages: water, tea, sparkling water, some alcoholic beverages   Goals: Aim for 6 hours of continuous sleep each night  Continue to have an awareness of your snack options and your frequency of snacking   Diabetes Self-Management Education  Visit Type: First/Initial  Appt. Start Time: 7:39 Appt. End Time: 8:42  08/06/2024  Ms. [name], identified by name and date of birth, is a 53 y.o. female with a diagnosis of Diabetes: Type 2.   ASSESSMENT  Height 6' 1 (1.854 m), weight 244 lb 3.2 oz (110.8 kg). Body mass index is 32.22 kg/m.   Diabetes Self-Management Education - 08/06/24 0759       Visit Information   Visit Type First/Initial      Initial Visit   Diabetes Type Type 2    Are you currently following a meal plan? No    Are you taking your medications as prescribed? No      Health Coping   How would you rate your overall health? Good      Psychosocial Assessment   Patient Belief/Attitude about Diabetes Defeat/Burnout    What is the hardest part about your diabetes right now,  causing you the most concern, or is the most worrisome to you about your diabetes?   Making healty food and beverage choices;Checking blood sugar    Self-care barriers None    Self-management support Family    Patient Concerns Nutrition/Meal planning;Weight Control    Special Needs Unable to determine    Preferred Learning Style Visual;Hands on    Learning Readiness Contemplating    How often do you need to have someone help you when you read instructions, pamphlets, or other written materials from your doctor or pharmacy? 1 - Never      Pre-Education Assessment   Patient understands the diabetes disease and treatment process. Needs Instruction    Patient understands incorporating nutritional management into lifestyle. Needs Instruction    Patient undertands incorporating physical activity into lifestyle. Needs Instruction    Patient understands using medications safely. Needs Instruction    Patient understands monitoring blood glucose, interpreting and using results Needs Instruction    Patient understands prevention, detection, and treatment of acute complications. Needs Instruction    Patient understands prevention, detection, and treatment of chronic complications. Needs Instruction    Patient understands how to develop strategies to address psychosocial issues. Needs Instruction    Patient understands how to develop strategies to promote health/change behavior.  Needs Instruction      Complications   Last HgB A1C per patient/outside source 12.4 %    How often do you check your blood sugar? 3-4 times/day    Fasting Blood glucose range (mg/dL) >799    Postprandial Blood glucose range (mg/dL) >799    Number of hypoglycemic episodes per month 0    Number of hyperglycemic episodes ( >200mg /dL): Weekly    Can you tell when your blood sugar is high? No    Have you had a dilated eye exam in the past 12 months? No   upcoming   Have you had a dental exam in the past 12 months? Yes    Are you  checking your feet? Yes    How many days per week are you checking your feet? 7      Activity / Exercise   Activity / Exercise Type ADL's    How many days per week do you exercise? 0    How many minutes per day do you exercise? 0    Total minutes per week of exercise 0      Patient Education   Previous Diabetes Education No    Disease Pathophysiology Definition of diabetes, type 1 and 2, and the diagnosis of diabetes;Factors that contribute to the development of diabetes    Healthy Eating Role of diet in the treatment of diabetes and the relationship between the three main macronutrients and blood glucose level;Food label reading, portion sizes and measuring food.;Plate Method;Carbohydrate counting;Effects of alcohol on blood glucose and safety factors with consumption of alcohol.;Meal timing in regards to the patients' current diabetes medication.;Reviewed blood glucose goals for pre and post meals and how to evaluate the patients' food intake on their blood glucose level.;Information on hints to eating out and maintain blood glucose control.    Medications Taught/reviewed insulin /injectables, injection, site rotation, insulin /injectables storage and needle disposal.;Reviewed patients medication for diabetes, action, purpose, timing of dose and side effects.    Monitoring Taught/evaluated CGM (comment);Daily foot exams;Identified appropriate SMBG and/or A1C goals.    Acute complications Taught prevention, symptoms, and  treatment of hypoglycemia - the 15 rule.;Discussed and identified patients' prevention, symptoms, and treatment of hyperglycemia.    Chronic complications Nephropathy, what it is, prevention of, the use of ACE, ARB's and early detection of through urine microalbumia.;Retinopathy and reason for yearly dilated eye exams;Dental care;Lipid levels, blood glucose control and heart disease;Assessed and discussed foot care and prevention of foot problems    Diabetes Stress and Support  Identified and addressed patients feelings and concerns about diabetes;Worked with patient to identify barriers to care and solutions;Role of stress on diabetes    Lifestyle and Health Coping Lifestyle issues that need to be addressed for better diabetes care      Individualized Goals (developed by patient)   Nutrition Follow meal plan discussed;General guidelines for healthy choices and portions discussed    Medications take my medication as prescribed    Monitoring  Test my blood glucose as discussed;Test blood glucose pre and post meals as discussed    Problem Solving Eating Pattern    Reducing Risk do foot checks daily;treat hypoglycemia with 15 grams of carbs if blood glucose less than 70mg /dL      Post-Education Assessment   Patient understands the diabetes disease and treatment process. Demonstrates understanding / competency    Patient understands incorporating nutritional management into lifestyle. Demonstrates understanding / competency    Patient undertands incorporating physical activity into lifestyle. Demonstrates understanding /  competency    Patient understands using medications safely. Demonstrates understanding / competency    Patient understands monitoring blood glucose, interpreting and using results Demonstrates understanding / competency    Patient understands prevention, detection, and treatment of acute complications. Demonstrates understanding / competency    Patient understands prevention, detection, and treatment of chronic complications. Demonstrates understanding / competency    Patient understands how to develop strategies to address psychosocial issues. Demonstrates understanding / competency    Patient understands how to develop strategies to promote health/change behavior. Demonstrates understanding / competency      Outcomes   Expected Outcomes Demonstrated interest in learning but significant barriers to change    Future DMSE 4-6 wks    Program Status  Completed          Individualized Plan for Diabetes Self-Management Training:   Learning Objective:  Patient will have a greater understanding of diabetes self-management. Patient education plan is to attend individual and/or group sessions per assessed needs and concerns.   Expected Outcomes:  Demonstrated interest in learning but significant barriers to change  Education material provided: ADA - How to Thrive: A Guide for Your Journey with Diabetes, Food label handouts, A1C conversion sheet, Meal plan card, My Plate, and Snack sheet  If problems or questions, patient to contact team via:  Phone and Email  Future DSME appointment: 4-6 wks

## 2024-08-07 ENCOUNTER — Other Ambulatory Visit: Payer: Self-pay | Admitting: Internal Medicine

## 2024-08-07 ENCOUNTER — Telehealth: Payer: Self-pay | Admitting: *Deleted

## 2024-08-07 DIAGNOSIS — E1165 Type 2 diabetes mellitus with hyperglycemia: Secondary | ICD-10-CM

## 2024-08-07 NOTE — Telephone Encounter (Signed)
 Copied from CRM #[id number]. Topic: Clinical - Prescription Issue >> Aug 07, 2024 11:15 AM Charolett L wrote: Reason for CRM: Patient stated that her prescription Insulin  Glargine (BASAGLAR  KWIKPEN) 100 UNIT/ML needs to be resent to the pharmacy with a higher amount of units due to discussion patient had with doctor about taking 15 units a day. CB# [phone]

## 2024-08-08 NOTE — Telephone Encounter (Signed)
 Pt returning karpuih call

## 2024-08-08 NOTE — Telephone Encounter (Signed)
 Follow up with pt's pharmacy and spoke to pharmacist, Hargis. Inform that Rx sent in for 15 units bid. Did received a fax from them asking to change to alternative. Hargis reports they had tried to run it through generic which they don't make it but it did go through with brand name.   Follow up with pt. Relay message to pt. Pt is aware. No further action is needed at this time.

## 2024-08-08 NOTE — Telephone Encounter (Signed)
 Attempted to reach pt. Left a voicemail to call us back.

## 2024-08-08 NOTE — Telephone Encounter (Signed)
 Need to find out what they will cover in same class. So we can prescribe.an alternative

## 2024-09-03 ENCOUNTER — Encounter: Attending: Internal Medicine | Admitting: Skilled Nursing Facility1

## 2024-09-03 ENCOUNTER — Encounter: Payer: Self-pay | Admitting: Skilled Nursing Facility1

## 2024-09-03 DIAGNOSIS — E1165 Type 2 diabetes mellitus with hyperglycemia: Secondary | ICD-10-CM | POA: Insufficient documentation

## 2024-09-03 DIAGNOSIS — Z794 Long term (current) use of insulin: Secondary | ICD-10-CM | POA: Diagnosis present

## 2024-09-03 NOTE — Progress Notes (Unsigned)
 Pt states she had to learn how to walk all over again in 2020 due to transverse myelitis.  Pt states the Holidays have got her off track a bit.  Pt states she has been discussing her journey with her husband for extra support.  Pt states they are trying to eat more often at home. Pt states she is a early bird but her husband works second shift. 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  Pt states she is a light sleeper and has aches and pains keeping her up at night .   Pt states she has been working on staying aware of what she is eating.  Pt states she has been tossing and turing a lot at night due to hr rotator cuffs being in pain.  Pt state she plans on keeping her own schedule with eating ad not flowing her husbands late night eating habits.  Pt states she has been drinking more water and reduced her soft drinks and juices.   DM medications: Insulin  15 units in the morning and 15 units in the evening   CGM Dexcom 14 days  TIR 50%; 46% high; 4% very high  GMI: not enough data Average glucose 188  Snacks on nuts throughout the day instead of chips as often so no longer spending time at the vending machine Breakfast: Rye toast and egg and avocado OR protein shake: berries and powder and peanut butter and water or oatmeal (cinn) sometimes with eggs Snack: yogurt Lunch (on the go): half sandwich and handful of nuts  Snack: berries, nuts Dinner: eaten out or chicken + potatoes + carrots and celery Snack: popcorn   Beverages: water, tea, sparkling water, some alcoholic beverages   Goals: Aim for 6 hours of continuous sleep each night: difficult due to pain Continue to have an awareness of your snack options and your frequency of snacking  NEW: Keep a 1/4 measuring cup in the nuts bag to limit to just that snack instead of over snacking on the nuts and not being hungry for dinner New: have an actual meal for lunch 2 months 85 %

## 2024-10-29 ENCOUNTER — Encounter: Admitting: Skilled Nursing Facility1
